# Patient Record
Sex: Male | Born: 1950 | Race: Black or African American | Hispanic: No | Marital: Married | Smoking: Former smoker
Health system: Southern US, Community
[De-identification: ages and names within clinical notes are randomized; demographics above are authoritative.]

## PROBLEM LIST (undated history)

## (undated) DIAGNOSIS — I4892 Unspecified atrial flutter: Secondary | ICD-10-CM

## (undated) DIAGNOSIS — I69320 Aphasia following cerebral infarction: Secondary | ICD-10-CM

## (undated) DIAGNOSIS — F0390 Unspecified dementia without behavioral disturbance: Secondary | ICD-10-CM

## (undated) DIAGNOSIS — I639 Cerebral infarction, unspecified: Secondary | ICD-10-CM

## (undated) DIAGNOSIS — I428 Other cardiomyopathies: Secondary | ICD-10-CM

## (undated) HISTORY — DX: Other cardiomyopathies: I42.8

## (undated) HISTORY — DX: Unspecified atrial flutter: I48.92

## (undated) HISTORY — DX: Cerebral infarction, unspecified: I63.9

## (undated) HISTORY — DX: Aphasia following cerebral infarction: I69.320

## (undated) HISTORY — PX: HERNIA REPAIR: SHX51

## (undated) HISTORY — PX: HIP SURGERY: SHX245

---

## 2009-10-23 ENCOUNTER — Ambulatory Visit: Payer: Self-pay | Admitting: Gastroenterology

## 2014-02-28 ENCOUNTER — Ambulatory Visit: Payer: Self-pay

## 2016-09-07 ENCOUNTER — Emergency Department
Admission: EM | Admit: 2016-09-07 | Discharge: 2016-09-08 | Disposition: A | Discharge status: Home / Self Care | Payer: Medicare HMO | Attending: Emergency Medicine | Admitting: Emergency Medicine

## 2016-09-07 DIAGNOSIS — Z87891 Personal history of nicotine dependence: Secondary | ICD-10-CM | POA: Diagnosis not present

## 2016-09-07 DIAGNOSIS — N390 Urinary tract infection, site not specified: Secondary | ICD-10-CM

## 2016-09-07 DIAGNOSIS — R300 Dysuria: Secondary | ICD-10-CM

## 2016-09-07 DIAGNOSIS — N3090 Cystitis, unspecified without hematuria: Secondary | ICD-10-CM | POA: Diagnosis not present

## 2016-09-07 LAB — URINALYSIS COMPLETE WITH MICROSCOPIC (ARMC ONLY)
BACTERIA UA: NONE SEEN
Bilirubin Urine: NEGATIVE
GLUCOSE, UA: NEGATIVE mg/dL
Ketones, ur: NEGATIVE mg/dL
NITRITE: NEGATIVE
PROTEIN: 100 mg/dL — AB
Specific Gravity, Urine: 1.021 (ref 1.005–1.030)
pH: 5 (ref 5.0–8.0)

## 2016-09-07 MED ORDER — SODIUM CHLORIDE 0.9 % IV BOLUS (SEPSIS)
1000.0000 mL | Freq: Once | INTRAVENOUS | Status: AC
  Administered 2016-09-08: 1000 mL via INTRAVENOUS

## 2016-09-07 NOTE — ED Triage Notes (Addendum)
Pt presents to ED from [facility] clinic for c/o dysuria. Pt reports 2-3 days of painful/burning/ and frequency of urination. Pt denies pain, just reports feeling "uncomfortable". [name] reports pt was given IM injection of ABX (unsure of exact ABX) and was told to report to the ED if he began to feel worse. Pt reports fever of 101.5 earlier today, but is afebrile in triage.

## 2016-09-07 NOTE — ED Notes (Signed)
2 unsuccessful attempts by this RN to straight-stick pt for blood samples for lab tests.

## 2016-09-08 LAB — COMPREHENSIVE METABOLIC PANEL
ALK PHOS: 68 U/L (ref 38–126)
ALT: 13 U/L — AB (ref 17–63)
AST: 17 U/L (ref 15–41)
Albumin: 3.8 g/dL (ref 3.5–5.0)
Anion gap: 7 (ref 5–15)
BILIRUBIN TOTAL: 1.8 mg/dL — AB (ref 0.3–1.2)
BUN: 18 mg/dL (ref 6–20)
CO2: 22 mmol/L (ref 22–32)
CREATININE: 1.11 mg/dL (ref 0.61–1.24)
Calcium: 12.2 mg/dL — ABNORMAL HIGH (ref 8.9–10.3)
Chloride: 105 mmol/L (ref 101–111)
GFR calc Af Amer: >60 mL/min (ref >60)
Glucose, Bld: 114 mg/dL — ABNORMAL HIGH (ref 65–99)
Potassium: 4.3 mmol/L (ref 3.5–5.1)
Sodium: 134 mmol/L — ABNORMAL LOW (ref 135–145)
TOTAL PROTEIN: 7.6 g/dL (ref 6.5–8.1)

## 2016-09-08 LAB — CBC
HCT: 44.9 % (ref 40.0–52.0)
Hemoglobin: 15.6 g/dL (ref 13.0–18.0)
MCH: 31.5 pg (ref 26.0–34.0)
MCHC: 34.7 g/dL (ref 32.0–36.0)
MCV: 90.9 fL (ref 80.0–100.0)
Platelets: 210 K/uL (ref 150–440)
RBC: 4.94 MIL/uL (ref 4.40–5.90)
RDW: 12.9 % (ref 11.5–14.5)
WBC: 15 K/uL — ABNORMAL HIGH (ref 3.8–10.6)

## 2016-09-08 NOTE — ED Provider Notes (Signed)
[facility] Emergency Department Provider Note   ____________________________________________   First MD Initiated Contact with Patient 09/07/16 2322     (approximate)  I have reviewed the triage vital signs and the nursing notes.   HISTORY  Chief Complaint Dysuria    HPI Dustin Blanchard is a 65 y.o. male who comes into the hospital today with some pain with urination. The patient went to the walk-in clinic today due to some pain with urination he had been having since Saturday. He received some blood work and a shot of antibiotic. He reports that he was being discharged home and they told him that if anything was worse he should come back to the hospital but on his way out they said that he should come into the emergency department for further evaluation. The patient did have a temperature at home to 101.8. He has also not eaten much and reports he has lost 10 pounds in the past few days. He denies any vomiting but reports he just doesn't have an appetite. He denies any confusion, vomiting, or diarrhea. The patient just reports he doesn't want to eat. The family did not want to drive him all the way home just to have him come back so they decided to come in for evaluation. The patient did have a white blood cell count of 16 at his walk-in clinic appointment. He denies any flank pain.   History reviewed. No pertinent past medical history.  There are no active problems to display for this patient.   Past Surgical History:  Procedure Laterality Date  . HERNIA REPAIR    . HIP SURGERY      Prior to Admission medications   Not on File    Allergies Review of patient's allergies indicates no known allergies.  No family history on file.  Social History Social History  Substance Use Topics  . Smoking status: Former Games developer  . Smokeless tobacco: Never Used  . Alcohol use No    Review of Systems Constitutional:  fever/chills Eyes: No visual  changes. ENT: No sore throat. Cardiovascular: Denies chest pain. Respiratory: Denies shortness of breath. Gastrointestinal: No abdominal pain.  No nausea, no vomiting.  No diarrhea.  No constipation. Genitourinary:  dysuria. Musculoskeletal: Negative for back pain. Skin: Negative for rash. Neurological: Negative for headaches, focal weakness or numbness.  10-point ROS otherwise negative.  ____________________________________________   PHYSICAL EXAM:  VITAL SIGNS: ED Triage Vitals  Enc Vitals Group     BP 09/07/16 2036 127/82     Pulse Rate 09/07/16 2036 (!) 102     Resp 09/07/16 2036 16     Temp 09/07/16 2036 98.9 F (37.2 C)     Temp Source 09/07/16 2036 Oral     SpO2 09/07/16 2036 98 %     Weight 09/07/16 2037 155 lb (70.3 kg)     Height 09/07/16 2037 6' (1.829 m)     Head Circumference --      Peak Flow --      Pain Score 09/07/16 2037 0     Pain Loc --      Pain Edu? --      Excl. in GC? --     Constitutional: Alert and oriented. Well appearing and in no acute distress. Eyes: Conjunctivae are normal. PERRL. EOMI. Head: Atraumatic. Nose: No congestion/rhinnorhea. Mouth/Throat: Mucous membranes are moist.  Oropharynx non-erythematous. Cardiovascular: Normal rate, regular rhythm. Grossly normal heart sounds.  Good peripheral circulation. Respiratory: Normal respiratory effort.  No retractions. Lungs CTAB. Gastrointestinal: Soft and nontender. No distention. Positive bowel sounds. Musculoskeletal: No lower extremity tenderness nor edema.   Neurologic:  Normal speech and language. . Skin:  Skin is warm, dry and intact.  Psychiatric: Mood and affect are normal.   ____________________________________________   LABS (all labs ordered are listed, but only abnormal results are displayed)  Labs Reviewed  COMPREHENSIVE METABOLIC PANEL - Abnormal; Notable for the following:       Result Value   Sodium 134 (*)    Glucose, Bld 114 (*)    Calcium 12.2 (*)    ALT 13  (*)    Total Bilirubin 1.8 (*)    All other components within normal limits  CBC - Abnormal; Notable for the following:    WBC 15.0 (*)    All other components within normal limits  URINALYSIS COMPLETEWITH MICROSCOPIC ([facility] ONLY) - Abnormal; Notable for the following:    Color, Urine AMBER (*)    APPearance HAZY (*)    Hgb urine dipstick 3+ (*)    Protein, ur 100 (*)    Leukocytes, UA 2+ (*)    Squamous Epithelial / LPF 0-5 (*)    All other components within normal limits   ____________________________________________  EKG  none ____________________________________________  RADIOLOGY  none ____________________________________________   PROCEDURES  Procedure(s) performed: None  Procedures  Critical Care performed: No  ____________________________________________   INITIAL IMPRESSION / ASSESSMENT AND PLAN / ED COURSE  Pertinent labs & imaging results that were available during my care of the patient were reviewed by me and considered in my medical decision making (see chart for details).  This is a 65 year old male who comes into the hospital today after being diagnosed with UTI at his doctor's office. He reports that they told him to come in and have further evaluation. The patient had blood work done previously and received a dose of ceftriaxone at his doctor's office. The patient is afebrile here and is in no distress. He reports that he doesn't have much of an appetite. I will give the patient a liter of normal saline check some orthostatic vital signs. His vital signs otherwise are unremarkable. I will recheck some blood work as well as a urine culture. The patient already received a dose of antibiotics and does not need any further antibiotics at this time. I will reassess the patient once I received all of his information.  Clinical Course    The patient's repeat blood work was unremarkable. He does have a white count of 15 but he does not show any signs of  dehydration. His creatinine and BUN are unremarkable. I did give the patient a liter of normal saline and he did eat a meal tray here. The patient had no vomiting and feels well. His vital signs remained unremarkable while he was here and he is not orthostatic. I will discharge the patient to home. He did receive a prescription earlier today and he was told to come back to the office tomorrow for another shot of ceftriaxone. The patient will be discharged home to follow back up with the acute care clinic. ____________________________________________   FINAL CLINICAL IMPRESSION(S) / ED DIAGNOSES  Final diagnoses:  UTI (lower urinary tract infection)  Cystitis  Dysuria      NEW MEDICATIONS STARTED DURING THIS VISIT:  New Prescriptions   No medications on file     Note:  This document was prepared using Dragon voice recognition software and may include unintentional dictation errors.  Rebecka Apley, MD 09/08/16 [phone]

## 2016-09-08 NOTE — ED Notes (Signed)
Pt denies any c/o nausea after eating sandwich tray.

## 2016-09-08 NOTE — ED Notes (Signed)
Pt given meal tray and drink for PO challenge, per Dr Zenda Alpers.

## 2016-09-09 LAB — URINE CULTURE: Culture: <10000 — AB

## 2017-07-28 ENCOUNTER — Other Ambulatory Visit: Payer: Self-pay | Admitting: Unknown Physician Specialty

## 2017-07-28 DIAGNOSIS — Z86010 Personal history of colonic polyps: Secondary | ICD-10-CM

## 2017-07-28 DIAGNOSIS — K562 Volvulus: Secondary | ICD-10-CM

## 2017-08-12 ENCOUNTER — Ambulatory Visit
Admission: RE | Admit: 2017-08-12 | Discharge: 2017-08-12 | Disposition: A | Discharge status: Home / Self Care | Payer: Medicare HMO | Source: Ambulatory Visit | Attending: Unknown Physician Specialty | Admitting: Unknown Physician Specialty

## 2017-08-12 DIAGNOSIS — Z86010 Personal history of colonic polyps: Secondary | ICD-10-CM

## 2017-08-12 DIAGNOSIS — K562 Volvulus: Secondary | ICD-10-CM

## 2017-10-06 IMAGING — CT CT VIRTUAL COLONOSCOPY DIAGNOSTIC
2 of 7 series · 9 of 36 positions shown, 15 images · non-contrast
Comparison: None.

CLINICAL DATA: Incomplete optical colonoscopy.  Tortuous colon.

EXAM:
CT VIRTUAL COLONOSCOPY DIAGNOSTIC
TECHNIQUE: The patient was given a standard bowel preparation with Gastrografin
and barium for fluid and stool tagging respectively. The quality of
the bowel preparation is moderate. Automated CO2 insufflation of the
colon was performed prior to image acquisition and colonic
distention is moderate. Image post processing was used to generate a
3D endoluminal fly-through projection of the colon and to
electronically subtract stool/fluid as appropriate.

[Series 8: prone (id) · axial · 0.70mm/px · z∈[-513,-76]mm · 8 of 451 slices shown, 13 images]
[im 51/451  soft-tissue]
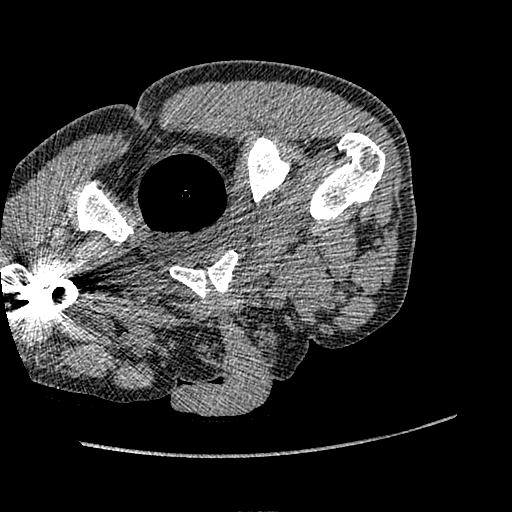
[im 51/451  bone]
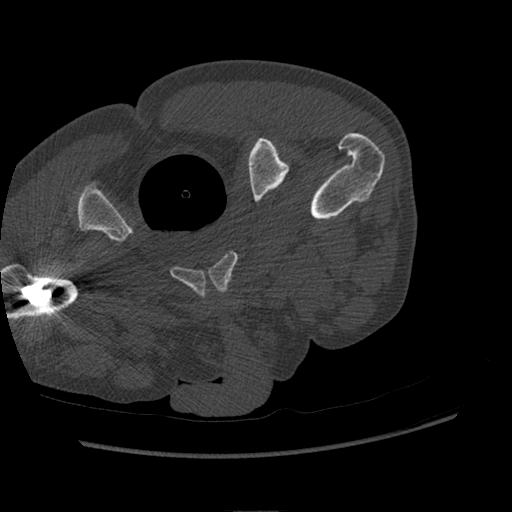
[im 101/451  soft-tissue]
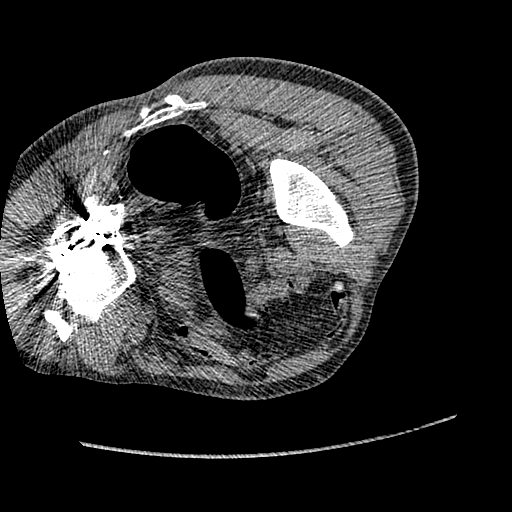
[im 151/451  soft-tissue]
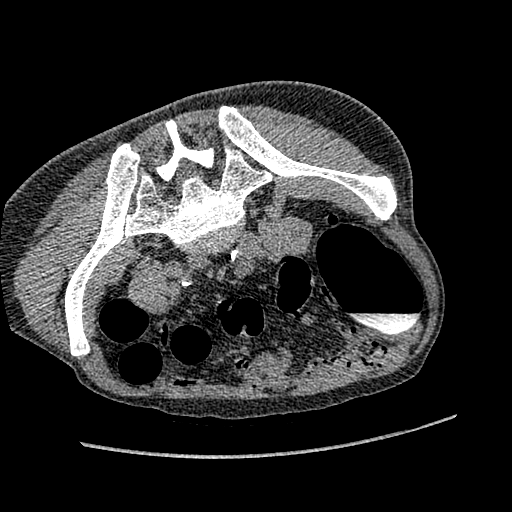
[im 201/451  soft-tissue]
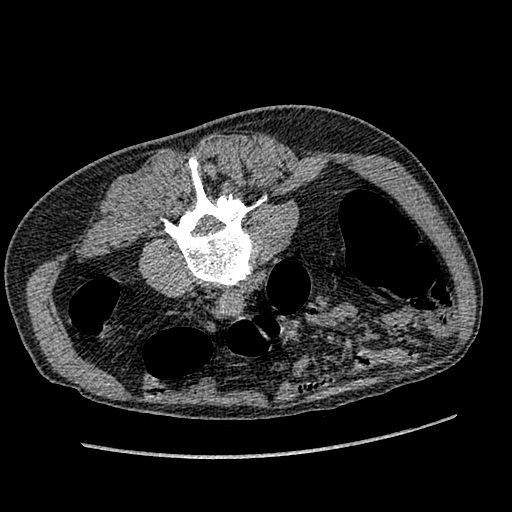
[im 251/451  soft-tissue]
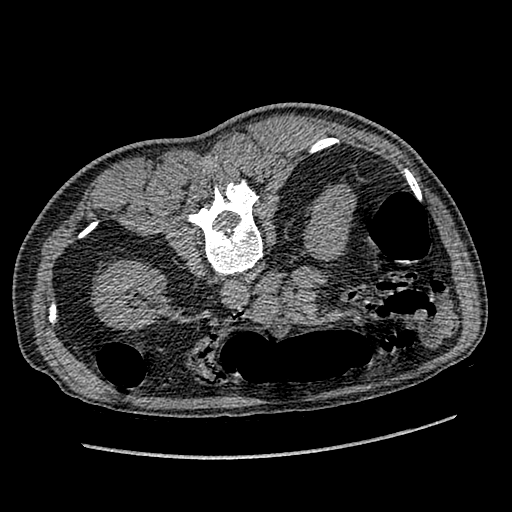
[im 251/451  lung]
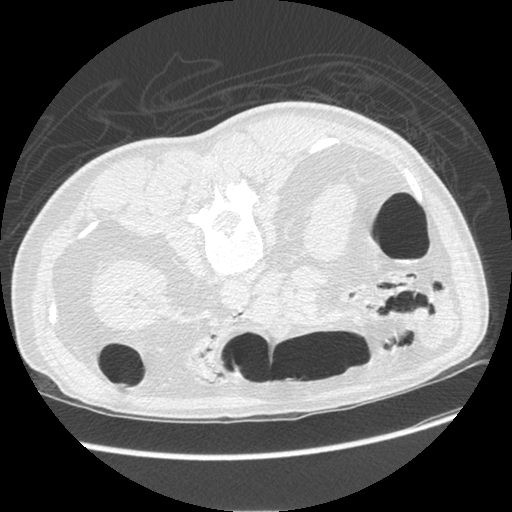
[im 301/451  soft-tissue]
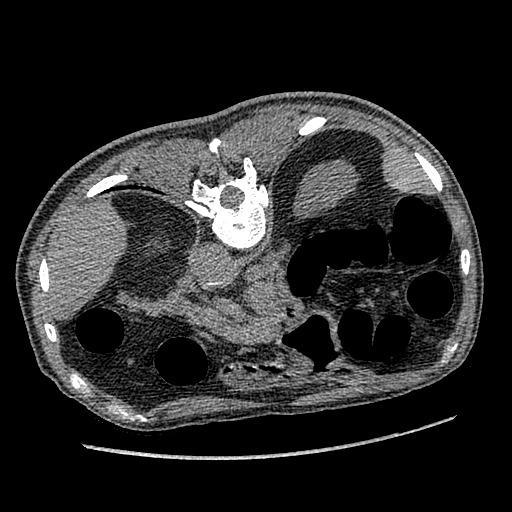
[im 301/451  lung]
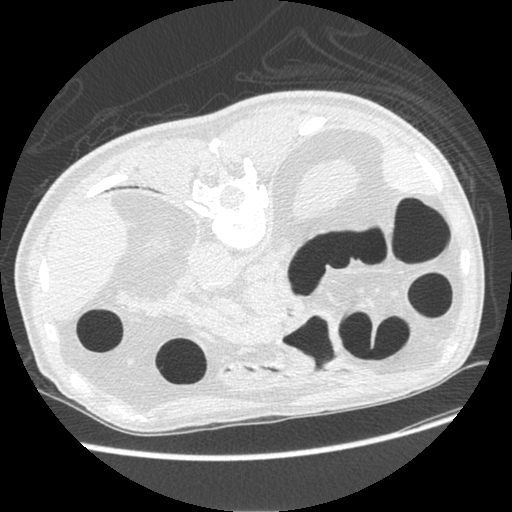
[im 351/451  soft-tissue]
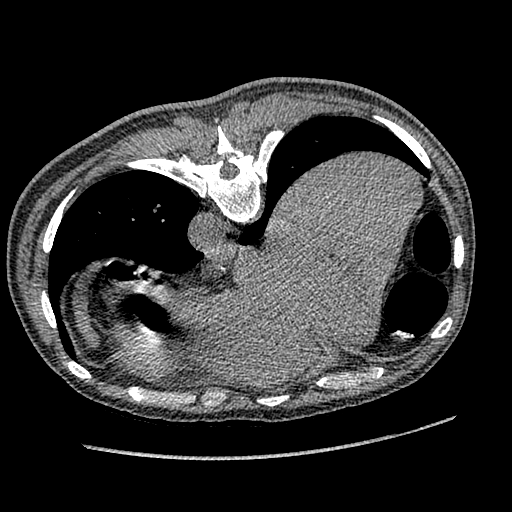
[im 351/451  lung]
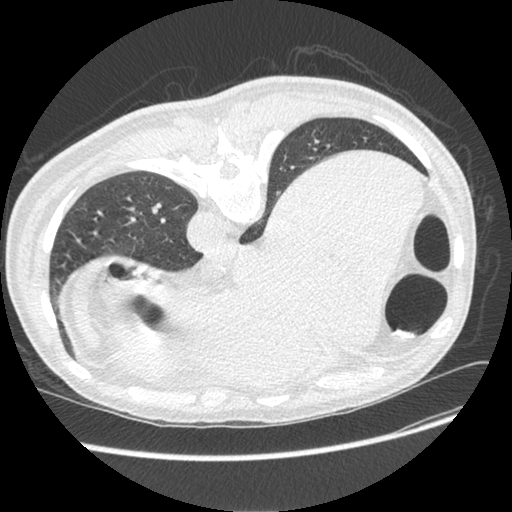
[im 401/451  soft-tissue]
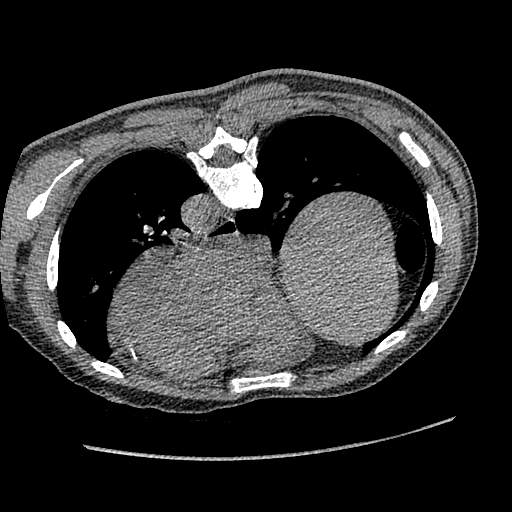
[im 401/451  lung]
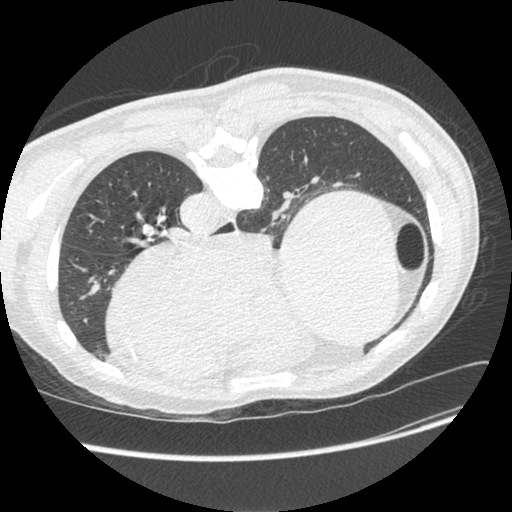

[Series 601: coronal body · coronal · 1.05mm/px · 1 of 114 slices shown, 2 images]
[im 38/114  soft-tissue]
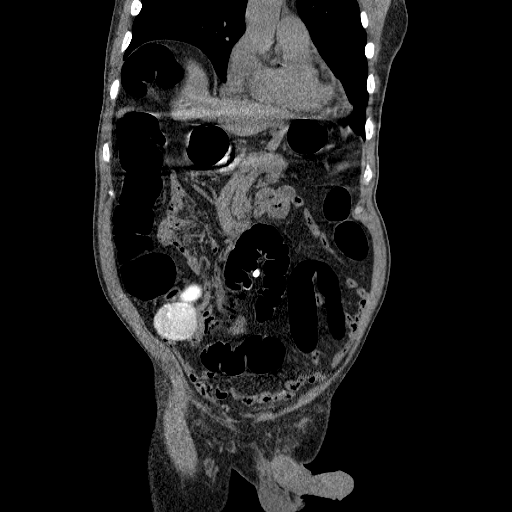
[im 38/114  bone]
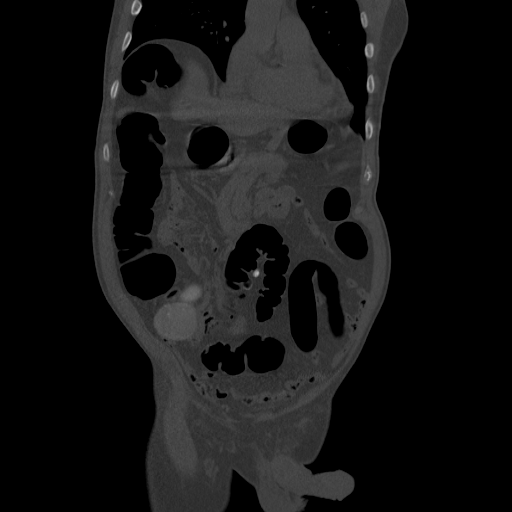

[9 of 36 positions shown; findings below may reference images not displayed]

FINDINGS: VIRTUAL COLONOSCOPY

Scattered sigmoid diverticula. Tortuosity of the colon. Some
retained barium tagged stool and fluid. No persistent non barium
tagged polypoid filling defect. No annular constricting lesions.

Virtual colonoscopy is not designed to detect diminutive polyps
(i.e., less than or equal to 5 mm), the presence or absence of which
may not affect clinical management.

CT ABDOMEN AND PELVIS WITHOUT CONTRAST

Lower chest: Heart is borderline in size. There is moderate to large
pericardial effusion. Moderate coronary artery calcifications. Lung
bases clear. Mild elevation of the right hemidiaphragm.

Hepatobiliary: No focal hepatic abnormality. Gallbladder not
visualized, possibly decompressed, contracted or surgically absent.

Pancreas: No focal abnormality or ductal dilatation.

Spleen: No focal abnormality.  Normal size.

Adrenals/Urinary Tract: Small exophytic left adrenal nodule measures
9 mm, nonspecific, but most likely adenoma. No focal renal
abnormality or hydronephrosis. Urinary bladder is unremarkable.

Stomach/Bowel: Appendix is visualized and is normal. Stomach and
small bowel decompressed, unremarkable.

Vascular/Lymphatic: No evidence of aneurysm or adenopathy.

Reproductive: Enlarged prostate, measuring 6.2 x 5.1 cm. Scattered
calcifications within the prostate.

Other: No free fluid or free air.

Musculoskeletal: Postoperative and posttraumatic changes within the
left pelvis and proximal femur. No acute bony abnormality.
Degenerative changes in the lumbar spine.
IMPRESSION: Tortuous colon. Scattered sigmoid diverticula. No persistent polyp
or annular constricting lesion.

Moderate to large pericardial effusion.

Coronary artery disease.

Small exophytic left adrenal nodule, nonspecific but most likely a
small at the normal in the absence of prior cancer history.

Enlarged prostate.

## 2022-08-06 ENCOUNTER — Encounter (HOSPITAL_COMMUNITY): Payer: Self-pay

## 2022-08-06 ENCOUNTER — Inpatient Hospital Stay (HOSPITAL_COMMUNITY): Payer: Medicare HMO

## 2022-08-06 ENCOUNTER — Ambulatory Visit (HOSPITAL_COMMUNITY): Payer: Medicare HMO

## 2022-08-06 ENCOUNTER — Encounter (HOSPITAL_COMMUNITY)
Admission: EM | Disposition: A | Discharge status: Rehabilitation Facility | Payer: Self-pay | Source: Home / Self Care | Attending: Neurology

## 2022-08-06 ENCOUNTER — Emergency Department (HOSPITAL_COMMUNITY): Payer: Medicare HMO | Admitting: Anesthesiology

## 2022-08-06 ENCOUNTER — Encounter (HOSPITAL_COMMUNITY): Payer: Self-pay | Admitting: Emergency Medicine

## 2022-08-06 ENCOUNTER — Inpatient Hospital Stay (HOSPITAL_COMMUNITY)
Admission: EM | Admit: 2022-08-06 | Discharge: 2022-08-12 | DRG: 023 | Disposition: A | Discharge status: Rehabilitation Facility | Payer: Medicare HMO | Attending: Neurology | Admitting: Neurology

## 2022-08-06 ENCOUNTER — Emergency Department (HOSPITAL_COMMUNITY): Payer: Medicare HMO

## 2022-08-06 ENCOUNTER — Emergency Department (EMERGENCY_DEPARTMENT_HOSPITAL): Payer: Medicare HMO | Admitting: Anesthesiology

## 2022-08-06 ENCOUNTER — Other Ambulatory Visit: Payer: Self-pay

## 2022-08-06 ENCOUNTER — Other Ambulatory Visit (HOSPITAL_COMMUNITY): Payer: Medicare HMO

## 2022-08-06 DIAGNOSIS — D72829 Elevated white blood cell count, unspecified: Secondary | ICD-10-CM | POA: Diagnosis not present

## 2022-08-06 DIAGNOSIS — Z87891 Personal history of nicotine dependence: Secondary | ICD-10-CM

## 2022-08-06 DIAGNOSIS — I63512 Cerebral infarction due to unspecified occlusion or stenosis of left middle cerebral artery: Secondary | ICD-10-CM

## 2022-08-06 DIAGNOSIS — N401 Enlarged prostate with lower urinary tract symptoms: Secondary | ICD-10-CM | POA: Diagnosis present

## 2022-08-06 DIAGNOSIS — I3139 Other pericardial effusion (noninflammatory): Secondary | ICD-10-CM | POA: Diagnosis present

## 2022-08-06 DIAGNOSIS — Z781 Physical restraint status: Secondary | ICD-10-CM | POA: Diagnosis not present

## 2022-08-06 DIAGNOSIS — I471 Supraventricular tachycardia: Secondary | ICD-10-CM | POA: Diagnosis not present

## 2022-08-06 DIAGNOSIS — R4701 Aphasia: Secondary | ICD-10-CM | POA: Diagnosis present

## 2022-08-06 DIAGNOSIS — I63412 Cerebral infarction due to embolism of left middle cerebral artery: Secondary | ICD-10-CM

## 2022-08-06 DIAGNOSIS — Z79899 Other long term (current) drug therapy: Secondary | ICD-10-CM

## 2022-08-06 DIAGNOSIS — I495 Sick sinus syndrome: Secondary | ICD-10-CM | POA: Diagnosis present

## 2022-08-06 DIAGNOSIS — N39498 Other specified urinary incontinence: Secondary | ICD-10-CM | POA: Diagnosis present

## 2022-08-06 DIAGNOSIS — I4891 Unspecified atrial fibrillation: Secondary | ICD-10-CM

## 2022-08-06 DIAGNOSIS — R29718 NIHSS score 18: Secondary | ICD-10-CM | POA: Diagnosis present

## 2022-08-06 DIAGNOSIS — I619 Nontraumatic intracerebral hemorrhage, unspecified: Secondary | ICD-10-CM | POA: Diagnosis present

## 2022-08-06 DIAGNOSIS — I429 Cardiomyopathy, unspecified: Secondary | ICD-10-CM | POA: Diagnosis present

## 2022-08-06 DIAGNOSIS — I509 Heart failure, unspecified: Secondary | ICD-10-CM | POA: Diagnosis not present

## 2022-08-06 DIAGNOSIS — G8191 Hemiplegia, unspecified affecting right dominant side: Secondary | ICD-10-CM | POA: Diagnosis present

## 2022-08-06 DIAGNOSIS — E210 Primary hyperparathyroidism: Secondary | ICD-10-CM | POA: Diagnosis present

## 2022-08-06 DIAGNOSIS — F05 Delirium due to known physiological condition: Secondary | ICD-10-CM | POA: Diagnosis not present

## 2022-08-06 DIAGNOSIS — I5041 Acute combined systolic (congestive) and diastolic (congestive) heart failure: Secondary | ICD-10-CM

## 2022-08-06 DIAGNOSIS — F01A0 Vascular dementia, mild, without behavioral disturbance, psychotic disturbance, mood disturbance, and anxiety: Secondary | ICD-10-CM | POA: Diagnosis present

## 2022-08-06 DIAGNOSIS — I455 Other specified heart block: Secondary | ICD-10-CM

## 2022-08-06 DIAGNOSIS — F0390 Unspecified dementia without behavioral disturbance: Secondary | ICD-10-CM | POA: Diagnosis not present

## 2022-08-06 DIAGNOSIS — I69391 Dysphagia following cerebral infarction: Secondary | ICD-10-CM | POA: Diagnosis not present

## 2022-08-06 DIAGNOSIS — I5043 Acute on chronic combined systolic (congestive) and diastolic (congestive) heart failure: Secondary | ICD-10-CM | POA: Diagnosis present

## 2022-08-06 DIAGNOSIS — T80818A Extravasation of other vesicant agent, initial encounter: Secondary | ICD-10-CM | POA: Diagnosis present

## 2022-08-06 DIAGNOSIS — I480 Paroxysmal atrial fibrillation: Secondary | ICD-10-CM | POA: Diagnosis present

## 2022-08-06 DIAGNOSIS — R1312 Dysphagia, oropharyngeal phase: Secondary | ICD-10-CM | POA: Diagnosis not present

## 2022-08-06 DIAGNOSIS — R001 Bradycardia, unspecified: Secondary | ICD-10-CM | POA: Diagnosis not present

## 2022-08-06 DIAGNOSIS — R531 Weakness: Secondary | ICD-10-CM | POA: Diagnosis present

## 2022-08-06 DIAGNOSIS — E785 Hyperlipidemia, unspecified: Secondary | ICD-10-CM | POA: Diagnosis present

## 2022-08-06 DIAGNOSIS — I110 Hypertensive heart disease with heart failure: Secondary | ICD-10-CM | POA: Diagnosis present

## 2022-08-06 DIAGNOSIS — N3000 Acute cystitis without hematuria: Secondary | ICD-10-CM | POA: Diagnosis not present

## 2022-08-06 DIAGNOSIS — Z20822 Contact with and (suspected) exposure to covid-19: Secondary | ICD-10-CM | POA: Diagnosis present

## 2022-08-06 DIAGNOSIS — R29810 Facial weakness: Secondary | ICD-10-CM | POA: Diagnosis present

## 2022-08-06 DIAGNOSIS — I255 Ischemic cardiomyopathy: Secondary | ICD-10-CM | POA: Diagnosis not present

## 2022-08-06 DIAGNOSIS — Z9282 Status post administration of tPA (rtPA) in a different facility within the last 24 hours prior to admission to current facility: Secondary | ICD-10-CM

## 2022-08-06 DIAGNOSIS — R1310 Dysphagia, unspecified: Secondary | ICD-10-CM | POA: Diagnosis present

## 2022-08-06 DIAGNOSIS — I5042 Chronic combined systolic (congestive) and diastolic (congestive) heart failure: Secondary | ICD-10-CM | POA: Diagnosis not present

## 2022-08-06 DIAGNOSIS — J189 Pneumonia, unspecified organism: Secondary | ICD-10-CM | POA: Diagnosis not present

## 2022-08-06 HISTORY — PX: IR PERCUTANEOUS ART THROMBECTOMY/INFUSION INTRACRANIAL INC DIAG ANGIO: IMG6087

## 2022-08-06 HISTORY — PX: RADIOLOGY WITH ANESTHESIA: SHX6223

## 2022-08-06 HISTORY — PX: IR CT HEAD LTD: IMG2386

## 2022-08-06 HISTORY — PX: IR US GUIDE VASC ACCESS RIGHT: IMG2390

## 2022-08-06 HISTORY — DX: Unspecified dementia, unspecified severity, without behavioral disturbance, psychotic disturbance, mood disturbance, and anxiety: F03.90

## 2022-08-06 LAB — URINALYSIS, ROUTINE W REFLEX MICROSCOPIC
Bacteria, UA: NONE SEEN
Bilirubin Urine: NEGATIVE
Glucose, UA: 50 mg/dL — AB
Ketones, ur: NEGATIVE mg/dL
Leukocytes,Ua: NEGATIVE
Nitrite: NEGATIVE
Protein, ur: NEGATIVE mg/dL
Specific Gravity, Urine: 1.019 (ref 1.005–1.030)
pH: 8 (ref 5.0–8.0)

## 2022-08-06 LAB — CBC
HCT: 40.5 % (ref 39.0–52.0)
Hemoglobin: 13.7 g/dL (ref 13.0–17.0)
MCH: 32.5 pg (ref 26.0–34.0)
MCHC: 33.8 g/dL (ref 30.0–36.0)
MCV: 96 fL (ref 80.0–100.0)
Platelets: 210 10*3/uL (ref 150–400)
RBC: 4.22 MIL/uL (ref 4.22–5.81)
RDW: 12.3 % (ref 11.5–15.5)
WBC: 4.2 10*3/uL (ref 4.0–10.5)
nRBC: 0 % (ref 0.0–0.2)

## 2022-08-06 LAB — ECHOCARDIOGRAM COMPLETE
Area-P 1/2: 5.66 cm2
Calc EF: 37.1 %
Height: 72 in
S' Lateral: 3.9 cm
Single Plane A2C EF: 39.1 %
Single Plane A4C EF: 37.2 %
Weight: 2539.7 oz

## 2022-08-06 LAB — RESP PANEL BY RT-PCR (FLU A&B, COVID) ARPGX2
Influenza A by PCR: NEGATIVE
Influenza B by PCR: NEGATIVE
SARS Coronavirus 2 by RT PCR: NEGATIVE

## 2022-08-06 LAB — COMPREHENSIVE METABOLIC PANEL
ALT: 13 U/L (ref 0–44)
AST: 14 U/L — ABNORMAL LOW (ref 15–41)
Albumin: 3.9 g/dL (ref 3.5–5.0)
Alkaline Phosphatase: 66 U/L (ref 38–126)
Anion gap: 7 (ref 5–15)
BUN: 10 mg/dL (ref 8–23)
CO2: 27 mmol/L (ref 22–32)
Calcium: 9.4 mg/dL (ref 8.9–10.3)
Chloride: 109 mmol/L (ref 98–111)
Creatinine, Ser: 1 mg/dL (ref 0.61–1.24)
GFR, Estimated: >60 mL/min (ref >60)
Glucose, Bld: 114 mg/dL — ABNORMAL HIGH (ref 70–99)
Potassium: 4.3 mmol/L (ref 3.5–5.1)
Sodium: 143 mmol/L (ref 135–145)
Total Bilirubin: 0.7 mg/dL (ref 0.3–1.2)
Total Protein: 6.9 g/dL (ref 6.5–8.1)

## 2022-08-06 LAB — DIFFERENTIAL
Abs Immature Granulocytes: 0.02 10*3/uL (ref 0.00–0.07)
Basophils Absolute: 0 10*3/uL (ref 0.0–0.1)
Basophils Relative: 0 %
Eosinophils Absolute: 0.1 10*3/uL (ref 0.0–0.5)
Eosinophils Relative: 1 %
Immature Granulocytes: 1 %
Lymphocytes Relative: 28 %
Lymphs Abs: 1.2 10*3/uL (ref 0.7–4.0)
Monocytes Absolute: 0.3 10*3/uL (ref 0.1–1.0)
Monocytes Relative: 6 %
Neutro Abs: 2.7 10*3/uL (ref 1.7–7.7)
Neutrophils Relative %: 64 %

## 2022-08-06 LAB — RAPID URINE DRUG SCREEN, HOSP PERFORMED
Amphetamines: NOT DETECTED
Barbiturates: NOT DETECTED
Benzodiazepines: NOT DETECTED
Cocaine: NOT DETECTED
Opiates: NOT DETECTED
Tetrahydrocannabinol: NOT DETECTED

## 2022-08-06 LAB — ETHANOL: Alcohol, Ethyl (B): <10 mg/dL (ref <10)

## 2022-08-06 LAB — HEMOGLOBIN A1C
Hgb A1c MFr Bld: 4.6 % — ABNORMAL LOW (ref 4.8–5.6)
Mean Plasma Glucose: 85.32 mg/dL

## 2022-08-06 LAB — GLUCOSE, CAPILLARY: Glucose-Capillary: 112 mg/dL — ABNORMAL HIGH (ref 70–99)

## 2022-08-06 LAB — MRSA NEXT GEN BY PCR, NASAL: MRSA by PCR Next Gen: NOT DETECTED

## 2022-08-06 LAB — HIV ANTIBODY (ROUTINE TESTING W REFLEX): HIV Screen 4th Generation wRfx: NONREACTIVE

## 2022-08-06 LAB — APTT: aPTT: 28 seconds (ref 24–36)

## 2022-08-06 LAB — PROTIME-INR
INR: 1.2 (ref 0.8–1.2)
Prothrombin Time: 15.2 seconds (ref 11.4–15.2)

## 2022-08-06 SURGERY — IR WITH ANESTHESIA
Anesthesia: General

## 2022-08-06 MED ORDER — SENNOSIDES-DOCUSATE SODIUM 8.6-50 MG PO TABS
1.0000 | ORAL_TABLET | Freq: Two times a day (BID) | ORAL | Status: DC | PRN
Start: 2022-08-06 — End: 2022-08-12

## 2022-08-06 MED ORDER — IOHEXOL 350 MG/ML SOLN
100.0000 mL | Freq: Once | INTRAVENOUS | Status: AC | PRN
  Administered 2022-08-06: 100 mL via INTRAVENOUS

## 2022-08-06 MED ORDER — SUGAMMADEX SODIUM 200 MG/2ML IV SOLN
INTRAVENOUS | Status: DC | PRN
  Administered 2022-08-06: 200 mg via INTRAVENOUS

## 2022-08-06 MED ORDER — PHENYLEPHRINE HCL-NACL 20-0.9 MG/250ML-% IV SOLN
INTRAVENOUS | Status: DC | PRN
  Administered 2022-08-06: 25 ug/min via INTRAVENOUS

## 2022-08-06 MED ORDER — CEFAZOLIN SODIUM-DEXTROSE 2-4 GM/100ML-% IV SOLN
INTRAVENOUS | Status: AC
  Filled 2022-08-06: qty 100

## 2022-08-06 MED ORDER — SODIUM CHLORIDE 0.9 % IV SOLN: INTRAVENOUS | Status: DC

## 2022-08-06 MED ORDER — SODIUM CHLORIDE 0.9 % IV SOLN: INTRAVENOUS | Status: DC | PRN

## 2022-08-06 MED ORDER — PANTOPRAZOLE SODIUM 40 MG IV SOLR
40.0000 mg | Freq: Every day | INTRAVENOUS | Status: DC
  Administered 2022-08-07 – 2022-08-08 (×3): 40 mg via INTRAVENOUS
  Filled 2022-08-06 (×3): qty 10

## 2022-08-06 MED ORDER — ACETAMINOPHEN 650 MG RE SUPP: 650.0000 mg | RECTAL | Status: DC | PRN

## 2022-08-06 MED ORDER — IOHEXOL 300 MG/ML  SOLN
100.0000 mL | Freq: Once | INTRAMUSCULAR | Status: AC | PRN
Start: 2022-08-06 — End: 2022-08-06
  Administered 2022-08-06: 50 mL via INTRA_ARTERIAL

## 2022-08-06 MED ORDER — LIDOCAINE HCL (CARDIAC) PF 50 MG/5ML IV SOSY
PREFILLED_SYRINGE | INTRAVENOUS | Status: DC | PRN
  Administered 2022-08-06: 60 mg via INTRAVENOUS

## 2022-08-06 MED ORDER — CLEVIDIPINE BUTYRATE 0.5 MG/ML IV EMUL: 0.0000 mg/h | INTRAVENOUS | Status: DC

## 2022-08-06 MED ORDER — ACETAMINOPHEN 325 MG PO TABS: 650.0000 mg | ORAL_TABLET | ORAL | Status: DC | PRN

## 2022-08-06 MED ORDER — SUCCINYLCHOLINE CHLORIDE 20 MG/ML IJ SOLN
INTRAMUSCULAR | Status: DC | PRN
  Administered 2022-08-06: 120 mg via INTRAVENOUS

## 2022-08-06 MED ORDER — EPHEDRINE SULFATE-NACL 50-0.9 MG/10ML-% IV SOSY
PREFILLED_SYRINGE | INTRAVENOUS | Status: DC | PRN
  Administered 2022-08-06: 5 mg via INTRAVENOUS

## 2022-08-06 MED ORDER — ACETAMINOPHEN 650 MG RE SUPP
650.0000 mg | RECTAL | Status: DC | PRN
Start: 2022-08-06 — End: 2022-08-12

## 2022-08-06 MED ORDER — PROPOFOL 10 MG/ML IV BOLUS
INTRAVENOUS | Status: DC | PRN
  Administered 2022-08-06: 120 mg via INTRAVENOUS

## 2022-08-06 MED ORDER — DEXAMETHASONE SODIUM PHOSPHATE 4 MG/ML IJ SOLN
INTRAMUSCULAR | Status: DC | PRN
  Administered 2022-08-06: 8 mg via INTRAVENOUS

## 2022-08-06 MED ORDER — IOHEXOL 300 MG/ML  SOLN
100.0000 mL | Freq: Once | INTRAMUSCULAR | Status: AC | PRN
  Administered 2022-08-06: 50 mL via INTRA_ARTERIAL

## 2022-08-06 MED ORDER — ORAL CARE MOUTH RINSE
15.0000 mL | OROMUCOSAL | Status: DC
  Administered 2022-08-06 – 2022-08-07 (×4): 15 mL via OROMUCOSAL

## 2022-08-06 MED ORDER — CHLORHEXIDINE GLUCONATE CLOTH 2 % EX PADS
6.0000 | MEDICATED_PAD | Freq: Every day | CUTANEOUS | Status: DC
Start: 2022-08-06 — End: 2022-08-07
  Administered 2022-08-06 – 2022-08-07 (×2): 6 via TOPICAL

## 2022-08-06 MED ORDER — CLEVIDIPINE BUTYRATE 0.5 MG/ML IV EMUL
0.0000 mg/h | INTRAVENOUS | Status: DC
  Administered 2022-08-06: 2 mg/h via INTRAVENOUS
  Filled 2022-08-06: qty 50

## 2022-08-06 MED ORDER — ONDANSETRON HCL 4 MG/2ML IJ SOLN
INTRAMUSCULAR | Status: DC | PRN
  Administered 2022-08-06: 4 mg via INTRAVENOUS

## 2022-08-06 MED ORDER — ACETAMINOPHEN 160 MG/5ML PO SOLN
650.0000 mg | ORAL | Status: DC | PRN
  Administered 2022-08-11: 650 mg
  Filled 2022-08-06: qty 20.3

## 2022-08-06 MED ORDER — CEFAZOLIN SODIUM-DEXTROSE 2-3 GM-%(50ML) IV SOLR
INTRAVENOUS | Status: DC | PRN
  Administered 2022-08-06: 2 g via INTRAVENOUS

## 2022-08-06 MED ORDER — STROKE: EARLY STAGES OF RECOVERY BOOK
Freq: Once | Status: AC
  Filled 2022-08-06 (×2): qty 1

## 2022-08-06 MED ORDER — LABETALOL HCL 5 MG/ML IV SOLN: 20.0000 mg | Freq: Once | INTRAVENOUS | Status: DC

## 2022-08-06 MED ORDER — ROCURONIUM BROMIDE 100 MG/10ML IV SOLN
INTRAVENOUS | Status: DC | PRN
  Administered 2022-08-06: 60 mg via INTRAVENOUS

## 2022-08-06 MED ORDER — ORAL CARE MOUTH RINSE: 15.0000 mL | OROMUCOSAL | Status: DC | PRN

## 2022-08-06 MED ORDER — ACETAMINOPHEN 160 MG/5ML PO SOLN: 650.0000 mg | ORAL | Status: DC | PRN

## 2022-08-06 MED ORDER — LORAZEPAM 2 MG/ML IJ SOLN
1.0000 mg | Freq: Once | INTRAMUSCULAR | Status: DC
  Filled 2022-08-06: qty 1

## 2022-08-06 MED ORDER — LORAZEPAM 2 MG/ML IJ SOLN
1.0000 mg | Freq: Once | INTRAMUSCULAR | Status: AC
  Administered 2022-08-06: 1 mg via INTRAVENOUS
  Filled 2022-08-06: qty 1

## 2022-08-06 NOTE — Anesthesia Procedure Notes (Signed)
Procedure Name: Intubation Date/Time: 08/06/2022 6:51 AM  Performed by: Edmonia Caprio, CRNAPre-anesthesia Checklist: Patient identified, Emergency Drugs available, Suction available, Patient being monitored and Timeout performed Patient Re-evaluated:Patient Re-evaluated prior to induction Oxygen Delivery Method: Circle system utilized Preoxygenation: Pre-oxygenation with 100% oxygen Induction Type: IV induction and Rapid sequence Laryngoscope Size: Miller, 2, Glidescope and 3 Grade View: Grade I Tube type: Oral Tube size: 7.5 mm Number of attempts: 1 Airway Equipment and Method: Stylet and Video-laryngoscopy Secured at: 23 cm Tube secured with: Tape Dental Injury: Teeth and Oropharynx as per pre-operative assessment  Comments: DLx1 w Hyacinth Meeker 2, unable to pick up epiglottis.  Glidescope 3 with Gr 1 view, ETT passed easily.  +ETC02 but cuff leak.  Tried to exchange over bougie but new ETT not passing easily, visualized again with glidescope and 7.5 ETT placed, +BBS +ETC02.

## 2022-08-06 NOTE — Progress Notes (Signed)
Interventional Radiology Brief Note:  Patient assessed alongside Dr. Tommie Sams.  He awakens to voice and stimulation, however only grunts and does not follow commands. Minimal movement with the right hand noted.  Per RN, moving right leg, however this was not observed during visit today.  Groin site intact.   Repeat CT Head WO at 5pm. This has been ordered.   VSS. BP with adequate control.   NIR following.   Loyce Dys, MS RD PA-C

## 2022-08-06 NOTE — ED Provider Notes (Signed)
[facility] EMERGENCY DEPARTMENT Provider Note   CSN: [id number] Arrival date & time: 08/06/22  [id number]  An emergency department physician performed an initial assessment on this suspected stroke patient at 0443.  History  Chief Complaint  Patient presents with   Code Stroke    Dustin Blanchard is a 71 y.o. male.  Brought to the emergency department from home after a fall.  Patient went to bed at 11:30 PM.  Around 3 AM family heard him fall.  EMS was called and they have identified right-sided weakness, right-sided facial droop, leftward gaze with lack of speech.  Patient does not take blood thinners.       Home Medications Prior to Admission medications   Not on File      Allergies    Patient has no known allergies.    Review of Systems   Review of Systems  Physical Exam Updated Vital Signs BP 129/87   Pulse 69   Temp 98.4 F (36.9 C) (Oral)   Resp 18   Ht 6' (1.829 m)   Wt 72 kg   SpO2 97%   BMI 21.53 kg/m  Physical Exam Vitals and nursing note reviewed.  Constitutional:      General: He is in acute distress.  HENT:     Head: Atraumatic.  Eyes:     Comments: Leftward gaze  Cardiovascular:     Rate and Rhythm: Normal rate and regular rhythm.  Pulmonary:     Effort: Pulmonary effort is normal.     Breath sounds: Normal breath sounds.  Abdominal:     Palpations: Abdomen is soft.  Musculoskeletal:        General: No deformity.  Skin:    General: Skin is warm and dry.  Neurological:     GCS: GCS eye subscore is 4. GCS verbal subscore is 2. GCS motor subscore is 6.     Cranial Nerves: Facial asymmetry present.     Motor: Weakness (R hemiparesis) present.     ED Results / Procedures / Treatments   Labs (all labs ordered are listed, but only abnormal results are displayed) Labs Reviewed  COMPREHENSIVE METABOLIC PANEL - Abnormal; Notable for the following components:      Result Value   Glucose, Bld 114 (*)    AST 14 (*)    All other components  within normal limits  GLUCOSE, CAPILLARY - Abnormal; Notable for the following components:   Glucose-Capillary 112 (*)    All other components within normal limits  RESP PANEL BY RT-PCR (FLU A&B, COVID) ARPGX2  ETHANOL  PROTIME-INR  APTT  CBC  DIFFERENTIAL  RAPID URINE DRUG SCREEN, HOSP PERFORMED  URINALYSIS, ROUTINE W REFLEX MICROSCOPIC  I-STAT CHEM 8, ED    EKG None  Radiology CT ANGIO HEAD NECK W WO CM W PERF (CODE STROKE)  Result Date: 08/06/2022 CLINICAL DATA:  71 year old male code stroke presentation, left MCA symptomatology. EXAM: CT ANGIOGRAPHY HEAD AND NECK CT PERFUSION BRAIN TECHNIQUE: Multidetector CT imaging of the head and neck was performed using the standard protocol during bolus administration of intravenous contrast. Multiplanar CT image reconstructions and MIPs were obtained to evaluate the vascular anatomy. Carotid stenosis measurements (when applicable) are obtained utilizing NASCET criteria, using the distal internal carotid diameter as the denominator. Multiphase CT imaging of the brain was performed following IV bolus contrast injection. Subsequent parametric perfusion maps were calculated using RAPID software. RADIATION DOSE REDUCTION: This exam was performed according to the departmental dose-optimization program which includes automated  exposure control, adjustment of the mA and/or kV according to patient size and/or use of iterative reconstruction technique. CONTRAST:  OMNIPAQUE IOHEXOL 350 MG/ML SOLN COMPARISON:  Plain head CT 0449 hours today. CT cervical spine reported separately. FINDINGS: CT Brain Perfusion Findings: ASPECTS: Ten CBF (<30%) Volume: 40mL. CBV parameter changes of 13-16 mL at the left operculum. The Perfusion (Tmax>6.0s) volume: .  Hypoperfusion index 0.2. Mismatch Volume: Infarction Location:Left MCA territory CTA NECK Skeleton: Cervical spine CT detailed separately. No acute osseous abnormality identified. Upper chest: Mild  dependent atelectasis. Mild retained secretions in the upper thoracic esophagus. Other neck: Left thyroid nodules smaller than 1.5 cm Not clinically significant; no follow-up imaging recommended (ref: J Am Coll Radiol. 2015 Feb;12(2): 143-50).Otherwise negative. Aortic arch: Calcified aortic atherosclerosis. 3 vessel arch configuration. Right carotid system: Minimal plaque. Mild motion artifact distal to the right ICA bulb. No stenosis. Left carotid system: Mild tortuosity and minimal plaque. No stenosis. Vertebral arteries: Minimal plaque except at the left vertebral artery origin and V1 segment (series 7, image 290) where there is mild stenosis. No other stenosis along the course of both vertebral arteries to the skull base. CTA HEAD Posterior circulation: Moderate left vertebral artery soft plaque and stenosis series 7, image 155. This is proximal to the left PICA origin which remains patent. Patent vertebrobasilar junction. No distal right vertebral stenosis. Patent basilar artery without stenosis. Patent SCA and PCA origins. Posterior communicating arteries are diminutive or absent. Bilateral PCA branches appear fairly symmetric and within normal limits. No proximal left PCA occlusion. Anterior circulation: Bilateral ICA siphon atherosclerosis, with up to moderate plaque. Mild to moderate supraclinoid left ICA stenosis series 9, image 101. Contralateral right ICA siphon stenosis. Patent carotid termini. Patent MCA and ACA origins. Anterior communicating artery and bilateral ACA branches are within normal limits. Median artery of the corpus callosum (normal variant). Right MCA M1 segment and trifurcation are patent without stenosis. Right MCA branches are within normal limits. Left MCA M1 segment and left MCA trifurcation are patent. No left MCA M2 branch occlusion is identified. However, there is evidence of middle division M3 branch occlusion on series 12, image 26, corresponding to the level of the operculum.  Venous sinuses: Grossly patent. Anatomic variants: Median artery of the corpus callosum. Review of the MIP images confirms the above findings Preliminary report of the above discussed by telephone with Dr. Jaci Carrel on 08/06/2022 at 05:22 . IMPRESSION: 1. CT Perfusion is positive for left MCA territory oligemia, proximally 120 mL. No infarct core detected by strict criteria, but abnormal CBV at the left operculum. 2. No large vessel occlusion identified on CTA, but left M3 operculum branch occlusion(s) suspected. Still, given the CTP findings and presentation recommend discussion with Neurology for consideration of NIR intervention. 3. The above was discussed by telephone with Dr. Jaci Carrel in the ED on 08/06/2022 at 0522 hours, and also Dr. Iver Nestle at [phone] hours. 4. 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 Bilateral ICA siphon plaque with up to moderate Left supraclinoid stenosis. Moderate Left Vertebral V4 stenosis. No proximal Left PCA branch occlusion (small age indeterminate left occipital pole infarct on plain CT). Electronically Signed   By: Odessa Fleming M.D.   On: 08/06/2022 05:34   CT Cervical Spine Wo Contrast  Result Date: 08/06/2022 CLINICAL DATA:  71 year old male code stroke patient. Right side weakness. EXAM: CT CERVICAL SPINE WITHOUT CONTRAST TECHNIQUE: Multidetector CT imaging of the cervical spine was performed without intravenous contrast. Multiplanar CT image reconstructions  were also generated. RADIATION DOSE REDUCTION: This exam was performed according to the departmental dose-optimization program which includes automated exposure control, adjustment of the mA and/or kV according to patient size and/or use of iterative reconstruction technique. COMPARISON:  Plain head CT the same time reported separately. FINDINGS: Alignment: Straightening of cervical lordosis. Cervicothoracic junction alignment is within normal limits. Bilateral posterior element  alignment is within normal limits. Skull base and vertebrae: Visualized skull base is intact. No atlanto-occipital dissociation. C1 and C2 appear intact and aligned. No acute osseous abnormality identified. Soft tissues and spinal canal: No prevertebral fluid or swelling. No visible canal hematoma. Elongated stylohyoid ligament calcification greater on the left. Disc levels: Widespread advanced cervical spine degeneration. Multilevel mild and up to moderate cervical spinal stenosis (series 9, image 31). Upper chest: Mild apical lung scarring. Chronic appearing mild T1 superior endplate deformity, Schmorl's node. Other: 14 mm left thyroid nodule, Not clinically significant; no follow-up imaging recommended (ref: J Am Coll Radiol. 2015 Feb;12(2): 143-50). IMPRESSION: 1. No acute traumatic injury identified in the cervical spine. 2. 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 Electronically Signed   By: Odessa Fleming M.D.   On: 08/06/2022 05:19   CT HEAD CODE STROKE WO CONTRAST  Result Date: 08/06/2022 CLINICAL DATA:  Code stroke. 71 year old male. Fall at 0300 hours. Right side weakness and abnormal speech. EXAM: CT HEAD WITHOUT CONTRAST TECHNIQUE: Contiguous axial images were obtained from the base of the skull through the vertex without intravenous contrast. RADIATION DOSE REDUCTION: This exam was performed according to the departmental dose-optimization program which includes automated exposure control, adjustment of the mA and/or kV according to patient size and/or use of iterative reconstruction technique. COMPARISON:  None Available. FINDINGS: Brain: Cerebral volume is within normal limits for age. No midline shift, mass effect, or evidence of intracranial mass lesion. No ventriculomegaly. No acute intracranial hemorrhage identified. Patchy and confluent bilateral cerebral white matter hypodensity, fairly symmetric. No cortically based acute infarct  identified in the left MCA territory. But there is confluent hypodensity in the left occipital pole in a small area on series 5, image 16 (about 15-20 mm). Vascular: Calcified atherosclerosis at the skull base. No suspicious intracranial vascular hyperdensity. Skull: No acute osseous abnormality identified. Sinuses/Orbits: Visualized paranasal sinuses and mastoids are clear. Other: Leftward gaze. Scalp soft tissues appear within normal limits. ASPECTS [facility] Stroke Program Early CT Score) Total score (0-10 with 10 being normal): 10 (cytotoxic edema only in the left PCA territory). IMPRESSION: 1. Leftward gaze deviation, raising the possibility of cortical ischemia in this setting. No Left MCA territory cortical infarct changes are identified. But there is a small age indeterminate Left PCA infarct at the left occipital pole. 2. No acute intracranial hemorrhage identified. ASPECTS 10. 3. Advanced bilateral white matter changes most commonly due to small vessel disease. Study discussed by telephone with Dr. Jaci Carrel on 08/06/2022 at 05:11 . Electronically Signed   By: Odessa Fleming M.D.   On: 08/06/2022 05:11    Procedures .Critical Care  Performed by: Gilda Crease, MD Authorized by: Gilda Crease, MD   Critical care provider statement:    Critical care time (minutes):  30   Critical care was necessary to treat or prevent imminent or life-threatening deterioration of the following conditions:  CNS failure or compromise   Critical care was time spent personally by me on the following activities:  Development of treatment plan with patient or surrogate, discussions with consultants,  evaluation of patient's response to treatment, examination of patient, ordering and review of laboratory studies, ordering and review of radiographic studies, ordering and performing treatments and interventions, pulse oximetry, re-evaluation of patient's condition and review of old charts   I assumed  direction of critical care for this patient from another provider in my specialty: no     Care discussed with: accepting provider at another facility       Medications Ordered in ED Medications  iohexol (OMNIPAQUE) 350 MG/ML injection 100 mL (100 mLs Intravenous Contrast Given 08/06/22 0454)  LORazepam (ATIVAN) injection 1 mg (1 mg Intravenous Given 08/06/22 0504)    ED Course/ Medical Decision Making/ A&P                           Medical Decision Making Amount and/or Complexity of Data Reviewed Labs: ordered. Radiology: ordered.  Risk Prescription drug management.   Brought to the emergency department after a fall.  Patient noted to have significant right-sided deficit, left gaze preference with inability to cross midline with his eyes.  Patient presentation consistent with large vessel occlusion.  At arrival to the emergency department, last known normal was not clear.  He was therefore not a candidate for thrombolytics.  It was known that he was well when he went to bed, bedtime was not initially known.  Ultimately it was found out that he went to bed around 11:30 PM.  His presentation at 4:30 AM, therefore, was outside the window.  Patient did not have an obvious infarct on his screening CT.  He underwent CT angiography, CT perfusion study.  He does have a large penumbra in the left MCA territory.  It is felt that he has an M3 occlusion, discussed at length with Dr. Nevada Crane, radiology.  Recommendation by Tele-Neurology and Dr. Curly Shores, Neurology at [facility] is transfer to [facility] for IR.  Arranging EMS for emergency transfer        Final Clinical Impression(s) / ED Diagnoses Final diagnoses:  Cerebrovascular accident (CVA) due to occlusion of left middle cerebral artery [facility])    Rx / DC Orders ED Discharge Orders     None         Zoltan Genest, Gwenyth Allegra, MD 08/06/22 [phone]

## 2022-08-06 NOTE — Progress Notes (Addendum)
STROKE TEAM PROGRESS NOTE   INTERVAL HISTORY No family at the bedside. RN at bedside. Just arrived to unit from IR for a left M3 occlusion with TICI 2C, with Extravasation confirmed on flat panel CT. Delay angiograms showed no further extravasation, stable for over 30 minutes. Cleviprex is @ 3mg  for a SBP goal of  120-140 for 24 hrs.  He is lethargic. Not following commands, aphasic. No right arm movement, withdraws. Left side antigravity purposeful. No blink on left. Disconjugate gaze Right facial droop grimaces. Right moderate hemiparesis but able to move some.  Continue NS @ 59ml. Keep NPO  Repeat CTh Density overlying the left cerebral convexity is grossly similar to the intra procedural images obtained earlier the same day and is favored to reflect predominantly contrast extravasation; however, superimposed subarachnoid hemorrhage can not be entirely excluded. 2. Small focus of hypodensity in the left frontal lobe cortex suspicious for small evolving infarct.     Vitals:   08/06/22 0915 08/06/22 0925 08/06/22 0935 08/06/22 0945  BP: (!) 145/94  (!) 149/92 139/87  Pulse: 70 75 71 86  Resp: 18 17 18  (!) 23  Temp:  (!) 97 F (36.1 C)    TempSrc:      SpO2: 97% 98% 97% 98%  Weight:      Height:       CBC:  Recent Labs  Lab 08/06/22 0437  WBC 4.2  NEUTROABS 2.7  HGB 13.7  HCT 40.5  MCV 96.0  PLT [id number]   Basic Metabolic Panel:  Recent Labs  Lab 08/06/22 0437  NA 143  K 4.3  CL 109  CO2 27  GLUCOSE 114*  BUN 10  CREATININE 1.00  CALCIUM 9.4   Lipid Panel: No results for input(s): "CHOL", "TRIG", "HDL", "CHOLHDL", "VLDL", "LDLCALC" in the last 168 hours. HgbA1c: No results for input(s): "HGBA1C" in the last 168 hours. Urine Drug Screen: No results for input(s): "LABOPIA", "COCAINSCRNUR", "LABBENZ", "AMPHETMU", "THCU", "LABBARB" in the last 168 hours.  Alcohol Level  Recent Labs  Lab 08/06/22 0437  ETH <10    IMAGING past 24 hours CT ANGIO HEAD NECK W WO CM W  PERF (CODE STROKE)  Result Date: 08/06/2022 CLINICAL DATA:  71 year old male code stroke presentation, left MCA symptomatology. EXAM: CT ANGIOGRAPHY HEAD AND NECK CT PERFUSION BRAIN TECHNIQUE: Multidetector CT imaging of the head and neck was performed using the standard protocol during bolus administration of intravenous contrast. Multiplanar CT image reconstructions and MIPs were obtained to evaluate the vascular anatomy. Carotid stenosis measurements (when applicable) are obtained utilizing NASCET criteria, using the distal internal carotid diameter as the denominator. Multiphase CT imaging of the brain was performed following IV bolus contrast injection. Subsequent parametric perfusion maps were calculated using RAPID software. RADIATION DOSE REDUCTION: This exam was performed according to the departmental dose-optimization program which includes automated exposure control, adjustment of the mA and/or kV according to patient size and/or use of iterative reconstruction technique. CONTRAST:  13mL OMNIPAQUE IOHEXOL 350 MG/ML SOLN COMPARISON:  Plain head CT 0449 hours today. CT cervical spine reported separately. FINDINGS: CT Brain Perfusion Findings: ASPECTS: Ten CBF (<30%) Volume: 64mL. CBV parameter changes of 13-16 mL at the left operculum. The Perfusion (Tmax>6.0s) volume: 175mL.  Hypoperfusion index 0.2. Mismatch Volume: 131mL Infarction Location:Left MCA territory CTA NECK Skeleton: Cervical spine CT detailed separately. No acute osseous abnormality identified. Upper chest: Mild dependent atelectasis. Mild retained secretions in the upper thoracic esophagus. Other neck: Left thyroid nodules smaller than 1.5 cm Not  clinically significant; no follow-up imaging recommended (ref: J Am Coll Radiol. 2015 Feb;12(2): 143-50).Otherwise negative. Aortic arch: Calcified aortic atherosclerosis. 3 vessel arch configuration. Right carotid system: Minimal plaque. Mild motion artifact distal to the right ICA bulb. No  stenosis. Left carotid system: Mild tortuosity and minimal plaque. No stenosis. Vertebral arteries: Minimal plaque except at the left vertebral artery origin and V1 segment (series 7, image 290) where there is mild stenosis. No other stenosis along the course of both vertebral arteries to the skull base. CTA HEAD Posterior circulation: Moderate left vertebral artery soft plaque and stenosis series 7, image 155. This is proximal to the left PICA origin which remains patent. Patent vertebrobasilar junction. No distal right vertebral stenosis. Patent basilar artery without stenosis. Patent SCA and PCA origins. Posterior communicating arteries are diminutive or absent. Bilateral PCA branches appear fairly symmetric and within normal limits. No proximal left PCA occlusion. Anterior circulation: Bilateral ICA siphon atherosclerosis, with up to moderate plaque. Mild to moderate supraclinoid left ICA stenosis series 9, image 101. Contralateral right ICA siphon stenosis. Patent carotid termini. Patent MCA and ACA origins. Anterior communicating artery and bilateral ACA branches are within normal limits. Median artery of the corpus callosum (normal variant). Right MCA M1 segment and trifurcation are patent without stenosis. Right MCA branches are within normal limits. Left MCA M1 segment and left MCA trifurcation are patent. No left MCA M2 branch occlusion is identified. However, there is evidence of middle division M3 branch occlusion on series 12, image 26, corresponding to the level of the operculum. Venous sinuses: Grossly patent. Anatomic variants: Median artery of the corpus callosum. Review of the MIP images confirms the above findings Preliminary report of the above discussed by telephone with Dr. Joseph Berkshire on 08/06/2022 at 05:22 . IMPRESSION: 1. CT Perfusion is positive for left MCA territory oligemia, proximally 120 mL. No infarct core detected by strict criteria, but abnormal CBV at the left operculum. 2.  No large vessel occlusion identified on CTA, but left M3 operculum branch occlusion(s) suspected. Still, given the CTP findings and presentation recommend discussion with Neurology for consideration of NIR intervention. 3. The above was discussed by telephone with Dr. Joseph Berkshire in the ED on 08/06/2022 at 0522 hours, and also Dr. Curly Shores at [phone] hours. 4. 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 Bilateral ICA siphon plaque with up to moderate Left supraclinoid stenosis. Moderate Left Vertebral V4 stenosis. No proximal Left PCA branch occlusion (small age indeterminate left occipital pole infarct on plain CT). Electronically Signed   By: Genevie Ann M.D.   On: 08/06/2022 05:34   CT Cervical Spine Wo Contrast  Result Date: 08/06/2022 CLINICAL DATA:  71 year old male code stroke patient. Right side weakness. EXAM: CT CERVICAL SPINE WITHOUT CONTRAST TECHNIQUE: Multidetector CT imaging of the cervical spine was performed without intravenous contrast. Multiplanar CT image reconstructions were also generated. RADIATION DOSE REDUCTION: This exam was performed according to the departmental dose-optimization program which includes automated exposure control, adjustment of the mA and/or kV according to patient size and/or use of iterative reconstruction technique. COMPARISON:  Plain head CT the same time reported separately. FINDINGS: Alignment: Straightening of cervical lordosis. Cervicothoracic junction alignment is within normal limits. Bilateral posterior element alignment is within normal limits. Skull base and vertebrae: Visualized skull base is intact. No atlanto-occipital dissociation. C1 and C2 appear intact and aligned. No acute osseous abnormality identified. Soft tissues and spinal canal: No prevertebral fluid or swelling. No visible canal hematoma. Elongated stylohyoid ligament calcification greater on  the left. Disc levels: Widespread advanced cervical spine degeneration.  Multilevel mild and up to moderate cervical spinal stenosis (series 9, image 31). Upper chest: Mild apical lung scarring. Chronic appearing mild T1 superior endplate deformity, Schmorl's node. Other: 14 mm left thyroid nodule, Not clinically significant; no follow-up imaging recommended (ref: J Am Coll Radiol. 2015 Feb;12(2): 143-50). IMPRESSION: 1. No acute traumatic injury identified in the cervical spine. 2. 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 Electronically Signed   By: Genevie Ann M.D.   On: 08/06/2022 05:19   CT HEAD CODE STROKE WO CONTRAST  Result Date: 08/06/2022 CLINICAL DATA:  Code stroke. 71 year old male. Fall at 0300 hours. Right side weakness and abnormal speech. EXAM: CT HEAD WITHOUT CONTRAST TECHNIQUE: Contiguous axial images were obtained from the base of the skull through the vertex without intravenous contrast. RADIATION DOSE REDUCTION: This exam was performed according to the departmental dose-optimization program which includes automated exposure control, adjustment of the mA and/or kV according to patient size and/or use of iterative reconstruction technique. COMPARISON:  None Available. FINDINGS: Brain: Cerebral volume is within normal limits for age. No midline shift, mass effect, or evidence of intracranial mass lesion. No ventriculomegaly. No acute intracranial hemorrhage identified. Patchy and confluent bilateral cerebral white matter hypodensity, fairly symmetric. No cortically based acute infarct identified in the left MCA territory. But there is confluent hypodensity in the left occipital pole in a small area on series 5, image 16 (about 15-20 mm). Vascular: Calcified atherosclerosis at the skull base. No suspicious intracranial vascular hyperdensity. Skull: No acute osseous abnormality identified. Sinuses/Orbits: Visualized paranasal sinuses and mastoids are clear. Other: Leftward gaze. Scalp soft tissues appear  within normal limits. ASPECTS [facility] Stroke Program Early CT Score) Total score (0-10 with 10 being normal): 10 (cytotoxic edema only in the left PCA territory). IMPRESSION: 1. Leftward gaze deviation, raising the possibility of cortical ischemia in this setting. No Left MCA territory cortical infarct changes are identified. But there is a small age indeterminate Left PCA infarct at the left occipital pole. 2. No acute intracranial hemorrhage identified. ASPECTS 10. 3. Advanced bilateral white matter changes most commonly due to small vessel disease. Study discussed by telephone with Dr. Joseph Berkshire on 08/06/2022 at 05:11 . Electronically Signed   By: Genevie Ann M.D.   On: 08/06/2022 05:11    PHYSICAL EXAM  Temp:  [97 F (36.1 C)-98.4 F (36.9 C)] 97 F (36.1 C) (08/17 0925) Pulse Rate:  [68-115] 75 (08/17 1345) Resp:  [12-30] 17 (08/17 1345) BP: (114-150)/(72-100) 116/91 (08/17 1345) SpO2:  [89 %-100 %] 97 % (08/17 1345) Weight:  [72 kg] 72 kg (08/17 0438)  General - critically ill male in NAD.  Cardiovascular - Regular rhythm and rate.  Mental Status -  He is lethargic Not following commands, aphasic. No right arm movement, withdraws. Left side antigravity purposeful. No blink on left. Dysconjugate gaze Right facial droop grimaces to noxious stimuli. Right hemiparesis but has some movement to pain Gait and Station - deferred.   ASSESSMENT/PLAN Mr. Kebin Merrick is a 71 y.o. male with history of   Dustin Blanchard is a 71 y.o. male with a past medical history significant for hypertension, hyperlipidemia, sinus bradycardia, BPH   He was his normal state of health when he went to bed at 2330.  At approximately 2:58 AM his wife heard him grunting and found him on the floor in his urine.   At baseline he is continent,  ambulates without assistance and still drives, although he has been encouraged to always drive with his wife in the car as he does sometimes get lost.  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 Leftward gaze deviation, raising the possibility of cortical ischemia in this setting. No Left MCA territory cortical infarct changes are identified. But there is a small age indeterminate Left PCA infarct at the left occipital pole. 2. No acute intracranial hemorrhage identified. ASPECTS 10. 3. Advanced bilateral white matter changes most commonly due to small vessel disease CTA head & neck with CT perfusion  1. CT Perfusion is positive for left MCA territory oligemia, proximally 120 mL. No infarct core detected by strict criteria, but abnormal CBV at the left operculum. 2. No large vessel occlusion identified on CTA, but left M3 operculum branch occlusion(s) suspected. Still, given the CTP findings and presentation recommend discussion with Neurology for consideration of NIR intervention. 3. 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 Bilateral ICA siphon plaque with up to moderate Left supraclinoid stenosis. Moderate Left Vertebral V4 stenosis. No proximal Left PCA branch occlusion (small age indeterminate left occipital pole infarct on plain CT). Cerebral angio/Post IR CT  1. 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 2. Capillary hyperemia along patent anterior left parietal M3 branch with early venous drainage suggestive of ongoing ischemia. 3. Access to occluded branch with microwire resulted in recanalization (TICI 2C) with small contrast extravasation which resolved spontaneously. Delay flat panel CT and cerebral angiogram showed no further contrast extravasation. Repeat CT H  1. Density overlying the left cerebral convexity is grossly similar to the intra procedural  images obtained earlier the same day and is favored to reflect predominantly contrast extravasation; however, superimposed subarachnoid hemorrhage can not be entirely excluded. 2. Small focus of hypodensity in the left frontal lobe cortex suspicious for small evolving infarct MRI  pending 2D Echo pending LDL pending HgbA1c pending VTE prophylaxis - SCD's    Diet   Diet NPO time specified   No antithrombotic prior to admission, now on No antithrombotic.  Therapy recommendations:  pending Disposition:  pending  Hypertension Home meds:  none  UnStable On Cleviprex gtt  SBP goal 120-140 for 24 hrs s/p thrombectomy Long-term BP goal normotensive  Hyperlipidemia Home meds:  atorvastatin,  LDL pending goal < 70 Continue statin at discharge   Other Stroke Risk Factors Advanced Age >/= 72   Other Active Problems Dementia  Dysphagia   Hospital day # 0  Gevena Mart DNP, ACN[facility]-AG    STROKE MD NOTE : I have personally obtained history,examined this patient, reviewed notes, independently viewed imaging studies, participated in medical decision making and plan of care.ROS completed by me personally and pertinent positives fully documented  I have made any additions or clarifications directly to the above note. Agree with note above.  Patient presented with aphasia due to left M3 occlusion initially not seen on CT angio with CT perfusion showed significant perfusion deficit meriting emergent diagnostic cerebral catheter angiogram which showed left M3 occlusion and attempt at mechanical thrombectomy resulted date in subarachnoid hemorrhage.  Patient remains aphasic with right hemiparesis.  Recommend strict control of blood pressure with systolic goal 120-140 for the first 24 hours and then below 160.  Close neurological observation as per postlumpectomy protocol.  Repeat CT scan of the head in a few hours confirm subarachnoid hemorrhage versus dye extravasation.  No family available at the  bedside.  Discussed with Dr. Sherlon Handing interventional neuroradiologist. This patient is critically ill and at significant risk of neurological worsening, death and care requires constant monitoring of vital signs, hemodynamics,respiratory and cardiac monitoring, extensive review of multiple databases, frequent neurological assessment, discussion with family, other specialists and medical decision making of high complexity.I have made any additions or clarifications directly to the above note.This critical care time does not reflect procedure time, or teaching time or supervisory time of PA/NP/Med Resident etc but could involve care discussion time.  I spent 35 minutes of neurocritical care time  in the care of  this patient.     Delia Heady, MD Medical Director [facility] [place] [facility] Pager: [phone] 08/06/2022 2:49 PM  To contact Stroke Continuity provider, please refer to [email or web address]. After hours, contact General Neurology

## 2022-08-06 NOTE — H&P (Signed)
Neurology H&P  CC: Right sided weakness and aphasia  History is obtained from:  HPI: Dustin Blanchard is a 71 y.o. male with a past medical history significant for hypertension, hyperlipidemia, sinus bradycardia, BPH  He was his normal state of health when he went to bed at 2330.  At approximately 2:58 AM his wife heard him grunting and found him on the floor in his urine.  At baseline he is continent, ambulates without assistance and still drives, although he has been encouraged to always drive with his wife in the car as he does sometimes get lost.  He is independent in all of his ADLs but does require assistance with his IADLs  Per family no other recent symptoms or concerns  CT perfusion imaging was completed and on reviewing this push notification to my phone I logged onto the telespecialist call to assist with transfer in case it was indicated based on their evaluation; of note he did require 1 mg of Ativan to facilitate imaging  Please note, per telestroke nurse note: "Code stroke activated at 0433; Pt to CT at 0443 and back at 0510. TS paged at [place] and Dr Gaynell Face on screen at [phone]. Dr Curly Shores [facility] Neurologist) also joined stroke Cart. 0530 Dr Betsey Holiday at bedside to give Dr Gaynell Face CTA/perfusion read and advised that he had already contacted [facility] to speak with neurology at [facility] regarding transfer. After [facility] disconnected, Dr Curly Shores did speak with family regarding transfer to [facility] for possible Thrombectomy."  LKW: 2330 tPA given?: No, due to out of the window IA performed?:  Yes Premorbid modified rankin scale:      3 - Moderate disability. Requires some help, but able to walk unassisted.   ROS: Unable to obtain secondary to mental status  Past Medical History:  Diagnosis Date   Dementia [facility])    Past Surgical History:  Procedure Laterality Date   HERNIA REPAIR     HIP SURGERY     No current outpatient medications on file Per family patient is on vitamin D,  tamsulosin, atorvastatin (started for diagnosis of vascular dementia), but not on aspirin.  Additionally they considered starting memantine but he declined using this medication  No family history on file.  Social History:  reports that he has quit smoking. He has never used smokeless tobacco. He reports that he does not drink alcohol. No history on file for drug use.   Exam: Current vital signs: BP 129/87   Pulse 69   Temp 98.4 F (36.9 C) (Oral)   Resp 18   Ht 6' (1.829 m)   Wt 72 kg   SpO2 97%   BMI 21.53 kg/m  Vital signs in last 24 hours: Temp:  [98.4 F (36.9 C)] 98.4 F (36.9 C) (08/17 0439) Pulse Rate:  [69-97] 69 (08/17 0530) Resp:  [18-22] 18 (08/17 0530) BP: (129-137)/(76-94) 129/87 (08/17 0530) SpO2:  [95 %-97 %] 97 % (08/17 0530) Weight:  [72 kg] 72 kg (08/17 0438)   Examination per telespecialists, partially observed by myself via video: 1A: Level of Consciousness - Alert; keenly responsive + 0 1B: Ask Month and Age - Aphasic + 2 1C: Blink Eyes & Squeeze Hands - Performs Both Tasks + 0 2: Test Horizontal Extraocular Movements - Forced Gaze Palsy: Cannot Be Overcome + 2 3: Test Visual Fields - Complete Hemianopia + 2 4: Test Facial Palsy (Use Grimace if Obtunded) - Partial paralysis (lower face) + 2 5A: Test Left Arm Motor Drift - No Drift  for 10 Seconds + 0 5B: Test Right Arm Motor Drift - Some Effort Against Gravity + 2 6A: Test Left Leg Motor Drift - No Drift for 5 Seconds + 0 6B: Test Right Leg Motor Drift - Drift, but doesn't hit bed + 1 7: Test Limb Ataxia (FNF/Heel-Shin) - No Ataxia + 0 8: Test Sensation - Complete Loss: Cannot Sense Being Touched At All + 2 9: Test Language/Aphasia - Mute/Global Aphasia: No Usable Speech/Auditory Comprehension + 3 10: Test Dysarthria - Severe Dysarthria: Unintelligble Slurring or Out of Proportion to Aphasia + 2 11: Test Extinction/Inattention - No abnormality + 0 NIHSS Score: 18  Full examination not completed in  person as patient was emergently taken to IR on arrival to [facility]   I have reviewed labs in epic and the results pertinent to this consultation are:  Basic Metabolic Panel: Recent Labs  Lab 08/06/22 0437  NA 143  K 4.3  CL 109  CO2 27  GLUCOSE 114*  BUN 10  CREATININE 1.00  CALCIUM 9.4    CBC: Recent Labs  Lab 08/06/22 0437  WBC 4.2  NEUTROABS 2.7  HGB 13.7  HCT 40.5  MCV 96.0  PLT 210    Coagulation Studies: Recent Labs    08/06/22 0437  LABPROT 15.2  INR 1.2      I have reviewed the images obtained:  CT head personally reviewed, agree with radiology:   1. Leftward gaze deviation, raising the possibility of cortical ischemia in this setting. No Left MCA territory cortical infarct changes are identified. But there is a small age indeterminate Left PCA infarct at the left occipital pole. 2. No acute intracranial hemorrhage identified. ASPECTS 10. 3. Advanced bilateral white matter changes most commonly due to small vessel disease.  CTA / CTP personally reviewed, agree with radiology:   1. CT Perfusion is positive for left MCA territory oligemia, proximally 120 mL. No infarct core detected by strict criteria, but abnormal CBV at the left operculum. 2. No large vessel occlusion identified on CTA, but left M3 operculum branch occlusion(s) suspected. Still, given the CTP findings and presentation recommend discussion with Neurology for consideration of NIR intervention. 3. The above was discussed by telephone with Dr. Joseph Berkshire in the ED on 08/06/2022 at 0522 hours, and also Dr. Curly Shores at [phone] hours. 4. 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 Bilateral ICA siphon plaque with up to moderate Left supraclinoid stenosis. Moderate Left Vertebral V4 stenosis. No proximal Left PCA branch occlusion (small age indeterminate left occipital pole infarct on plain CT).  Impression: Left MCA territory stroke out of the window for  acute intervention with thrombolytic but favorable for thrombectomy based on CT perfusion imaging and ASPECTS score  Recommendations: # Left MCA territory stroke - Stroke labs TSH, ESR, RPR, HgbA1c, fasting lipid panel - MRI brain and MRA post-thrombectomy (to be ordered) - Frequent neuro checks - Echocardiogram - Hold antiplatelets pending procedure, start aspirin and possibly Plavix if no significant hemorrhagic complications - Home statin dose to be adjusted if needed for LDL goal less than 70 - Risk factor modification - Telemetry monitoring;  - Blood pressure goal   - Post successful uncomplicated revascularization SBP 120 - 140 for 24 hours; if complications have arisen or only partial revascularization reach out to interventionalist or neurologist on call for BP goal - PT consult, OT consult, Speech consult, unless patient is back to baseline - Admitted to the stroke team, please page stroke MD with any questions or  concerns   Lesleigh Noe MD-PhD [facility] [phone] Available 7 PM to 7 AM, outside of these hours please call Neurologist on call as listed on Amion.  Total critical care time: 50 minutes   Critical care time was exclusive of separately billable procedures and treating other patients.   Critical care was necessary to treat or prevent imminent or life-threatening deterioration.   Critical care was time spent personally by me on the following activities: development of treatment plan with patient and/or surrogate as well as nursing, discussions with consultants/primary team, evaluation of patient's response to treatment, examination of patient, obtaining history from patient or surrogate, ordering and performing treatments and interventions, ordering and review of laboratory studies, ordering and review of radiographic studies, and re-evaluation of patient's condition as needed, as documented above.

## 2022-08-06 NOTE — Anesthesia Postprocedure Evaluation (Signed)
Anesthesia Post Note  Patient: Dustin Blanchard  Procedure(s) Performed: IR WITH ANESTHESIA     Patient location during evaluation: PACU Anesthesia Type: General Level of consciousness: awake, patient uncooperative and responds to stimulation Pain management: pain level controlled Vital Signs Assessment: post-procedure vital signs reviewed and stable Respiratory status: spontaneous breathing, nonlabored ventilation, respiratory function stable and patient connected to nasal cannula oxygen Cardiovascular status: blood pressure returned to baseline and stable Postop Assessment: no apparent nausea or vomiting Anesthetic complications: no   No notable events documented.  Last Vitals:  Vitals:   08/06/22 0935 08/06/22 0945  BP: (!) 149/92 139/87  Pulse: 71 86  Resp: 18 (!) 23  Temp:    SpO2: 97% 98%    Last Pain:  Vitals:   08/06/22 0439  TempSrc: Oral  PainSc:    Pain Goal:    LLE Motor Response: Purposeful movement (08/06/22 0935)   RLE Motor Response: Purposeful movement (08/06/22 0935)          Cheyane Ayon A.

## 2022-08-06 NOTE — ED Notes (Signed)
EMS called Code Stroke in field @ [phone].

## 2022-08-06 NOTE — Transfer of Care (Signed)
Immediate Anesthesia Transfer of Care Note  Patient: Dustin Blanchard  Procedure(s) Performed: IR WITH ANESTHESIA  Patient Location: PACU  Anesthesia Type:General  Level of Consciousness: awake, alert  and oriented  Airway & Oxygen Therapy: Patient Spontanous Breathing and Patient connected to face mask oxygen  Post-op Assessment: Report given to RN and Post -op Vital signs reviewed and stable  Post vital signs: Reviewed and stable  Last Vitals:  Vitals Value Taken Time  BP 146/89 08/06/22 0839  Temp    Pulse 71 08/06/22 0843  Resp 22 08/06/22 0843  SpO2 95 % 08/06/22 0843  Vitals shown include unvalidated device data.  Last Pain:  Vitals:   08/06/22 0439  TempSrc: Oral  PainSc:          Complications: No notable events documented.

## 2022-08-06 NOTE — Consult Note (Signed)
[facility] TeleNeurology Consult Services   Patient Name:   Blanchard, Dustin Date of Birth:   February 11, 1951 Identification Number:   MRN - [id number] Date of Service:   08/06/2022 05:10:54  Impression:      This is a 71 year old man with a left M3 occlusion. Patient has global aphasia and right-sided weakness. He is outside of the window for thrombolytic therapy. Neuro interventional has been called by the emergency room. We are awaiting their response regarding potential transfer. I would treat him with dual antiplatelet therapy. I recommend MRI of the brain.  Discussed with NIR Text: Emergency room physician has called neuro interventional. There is a left M3 occlusion. We are awaiting decision to see if the patient is appropriate for transfer.  Our recommendations are outlined below.  Recommendations:        Stroke/Telemetry Floor       Neuro Checks       Bedside Swallow Eval       DVT Prophylaxis       IV Fluids, Normal Saline       Head of Bed 30 Degrees       Euglycemia and Avoid Hyperthermia (PRN Acetaminophen)       Initiate dual antiplatelet therapy with Aspirin 81 mg daily and Clopidogrel 75 mg daily.  Per facility request will defer further work up, management, and referrals to inpatient service, inclusive of inpatient neurology consult.  Sign Out:       Discussed with Emergency Department Provider    ------------------------------------------------------------------------------  Advanced Imaging: CTA Head and Neck Completed.  CTP Completed.  LVO:Yes  Discussed with NIR :No  Discussed with NIR Text : Emergency room physician has called neuro interventional. There is a left M3 occlusion. We are awaiting decision to see if the patient is appropriate for transfer.   Metrics: Last Known Well: 08/05/2022 23:30:00 [facility] Notification Time: 08/06/2022 05:10:54 Arrival Time: 08/06/2022 04:35:00 Stamp Time: 08/06/2022 05:10:54 Initial  Response Time: 08/06/2022 05:15:14 Symptoms: right sided weakness . Initial patient interaction: 08/06/2022 05:22:45 NIHSS Assessment Completed: 08/06/2022 05:22:50 Patient is not a candidate for Thrombolytic. Thrombolytic Medical Decision: 08/06/2022 05:22:48 Patient was not deemed candidate for Thrombolytic because of following reasons: Last Well Known Above 4.5 Hours.  CT head showed no acute hemorrhage or acute core infarct.  Primary Provider Notified of Diagnostic Impression and Management Plan on: 08/06/2022 05:34:38    ------------------------------------------------------------------------------  History of Present Illness: Patient is a 71 year old Male.  Patient was brought by EMS for symptoms of right sided weakness . This is a 71 year old African-American man who presents to the hospital with chief complaint of right-sided weakness. The patient was well last evening. He does a history of mild dementia. The patient went to bed at 2330. He was found on the floor sometime after 3:00 in the morning. He is weak on the right side of his body. He was unable to speak. He was brought to the hospital.    Past Medical History: [place] PMH:  vascular dementia  Medications:  No Anticoagulant use   Reviewed EMR for current medications  Allergies:  NKDA  Social History: Drug Use: No  Family History:  There is no family history of premature cerebrovascular disease pertinent to this consultation  ROS : 14 Points Review of Systems was performed and was negative except mentioned in HPI.  Past Surgical History: There Is No Surgical History Contributory To Today's Visit     Examination: Blood Glucose(112) 1A: Level of Consciousness - Alert; keenly  responsive + 0 1B: Ask Month and Age - Aphasic + 2 1C: Blink Eyes & Squeeze Hands - Performs Both Tasks + 0 2: Test Horizontal Extraocular Movements - Forced Gaze Palsy: Cannot Be Overcome + 2 3: Test Visual Fields - Complete  Hemianopia + 2 4: Test Facial Palsy (Use Grimace if Obtunded) - Partial paralysis (lower face) + 2 5A: Test Left Arm Motor Drift - No Drift for 10 Seconds + 0 5B: Test Right Arm Motor Drift - Some Effort Against Gravity + 2 6A: Test Left Leg Motor Drift - No Drift for 5 Seconds + 0 6B: Test Right Leg Motor Drift - Drift, but doesn't hit bed + 1 7: Test Limb Ataxia (FNF/Heel-Shin) - No Ataxia + 0 8: Test Sensation - Complete Loss: Cannot Sense Being Touched At All + 2 9: Test Language/Aphasia - Mute/Global Aphasia: No Usable Speech/Auditory Comprehension + 3 10: Test Dysarthria - Severe Dysarthria: Unintelligble Slurring or Out of Proportion to Aphasia + 2 11: Test Extinction/Inattention - No abnormality + 0  NIHSS Score: 18   Pre-Morbid Modified Rankin Scale: 2 Points = Slight disability; unable to carry out all previous activities, but able to look after own affairs without assistance  Spoke with : Dr. Blinda Leatherwood  Patient/Family was informed the Neurology Consult would occur via TeleHealth consult by way of interactive audio and video telecommunications and consented to receiving care in this manner.   Patient is being evaluated for possible acute neurologic impairment and high probability of imminent or life-threatening deterioration. I spent total of 35 minutes providing care to this patient, including time for face to face visit via telemedicine, review of medical records, imaging studies and discussion of findings with providers, the patient and/or family.   Dr Raynelle Dick   [facility] For Inpatient follow-up with [facility] physician please call [facility] [phone]. This is not an outpatient service. Post hospital discharge, please contact hospital directly.

## 2022-08-06 NOTE — Progress Notes (Signed)
Code stroke activated at 0433; Pt to CT at 0443 and back at 0510. TS paged at 0510 and Dr Dewayne Hatch on screen at [phone]. Dr Iver Nestle [facility] Neurologist) also joined stroke Cart. 0530 Dr Blinda Leatherwood at bedside to give Dr Dewayne Hatch CTA/perfusion read and advised that he had already contacted [facility] to speak with neurology at [facility] regarding transfer. After [facility] disconnected, Dr Iver Nestle did speak with family regarding transfer to [facility] for possible Thrombectomy.  Ricci Barker, Multimedia programmer

## 2022-08-06 NOTE — ED Notes (Signed)
Beeped and Called out Code Stroke to CT and Lab @ [phone].

## 2022-08-06 NOTE — ED Notes (Signed)
Delay in [facility] transport. ETA 20 minutes

## 2022-08-06 NOTE — ED Triage Notes (Signed)
Per wife pt fell around 0300 and presented with slurred speech and right sided weakness.

## 2022-08-06 NOTE — Progress Notes (Signed)
*  PRELIMINARY RESULTS* Echocardiogram 2D Echocardiogram has been performed.  Stacey Drain 08/06/2022, 4:32 PM

## 2022-08-06 NOTE — Evaluation (Signed)
SLP Cancellation Note  Patient Details Name: Dustin Blanchard MRN: [id number] DOB: 1951-12-18   Cancelled treatment:       Reason Eval/Treat Not Completed: Other (comment);Patient at procedure or test/unavailable (per chart, pt is in IR at this time; will continue efforts)  Rolena Infante, MS [facility] SLP Acute Rehab Services Office [phone] Pager [phone]   Chales Abrahams 08/06/2022, 7:26 AM

## 2022-08-06 NOTE — Progress Notes (Signed)
SLP Cancellation Note  Patient Details Name: Dustin Blanchard MRN: [id number] DOB: March 23, 1951   Cancelled treatment:       Reason Eval/Treat Not Completed: Fatigue/lethargy limiting ability to participate (Pt's case discussed with Marissa, RN who advised that the pt is currently too lethargic for the swallow evaluation. It was agreed that SLP will follow up on subsequent date unless contacted sooner.)  [name] I. Vear Clock, MS, CCC-SLP Acute Rehabilitation Services Office number [phone]  Scheryl Marten 08/06/2022, 3:07 PM

## 2022-08-06 NOTE — Sedation Documentation (Signed)
Pt transported to PACU via bed accompanied by RN and CRNA. Philip RN at bedside to receive pt. Handoff complete bilateral lower distal pulses present via doppler. Right groin remains level 0. No s/s of distress at this time.

## 2022-08-06 NOTE — Progress Notes (Signed)
Code stroke  Call time  430am   Ems in route Beeper   440am Start   450 End   5am Soc   5am Epic   5am Rad   505am

## 2022-08-06 NOTE — Progress Notes (Signed)
PT Cancellation Note  Patient Details Name: Dustin Blanchard MRN: [id number] DOB: [birth year]   Cancelled Treatment:    Reason Eval/Treat Not Completed: Active bedrest order; patient on bedrest post procedure, will attempt later as appropriate.   Elray Mcgregor 08/06/2022, 9:36 AM Sheran Lawless, PT Acute Rehabilitation Services Office:[phone] 08/06/2022

## 2022-08-06 NOTE — Anesthesia Preprocedure Evaluation (Signed)
Anesthesia Evaluation    Reviewed: Allergy & Precautions, Patient's Chart, lab work & pertinent test results  History of Anesthesia Complications Negative for: history of anesthetic complications  Airway Mallampati: Unable to assess  TM Distance: >3 FB     Dental   Pulmonary former smoker,    breath sounds clear to auscultation       Cardiovascular +CHF   Rhythm:Regular Rate:Normal   '19 TTE - EF 45%    Neuro/Psych PSYCHIATRIC DISORDERS Dementia CVA, Residual Symptoms    GI/Hepatic   Endo/Other   Primary hyperparathyroidism    Renal/GU      Musculoskeletal   Abdominal   Peds  Hematology   Anesthesia Other Findings   Reproductive/Obstetrics                             Anesthesia Physical Anesthesia Plan  ASA: 4 and emergent  Anesthesia Plan: General   Post-op Pain Management: Minimal or no pain anticipated   Induction: Intravenous, Cricoid pressure planned and Rapid sequence  PONV Risk Score and Plan: 2 and Treatment may vary due to age or medical condition and Ondansetron  Airway Management Planned: Oral ETT  Additional Equipment:   Intra-op Plan:   Post-operative Plan: Possible Post-op intubation/ventilation  Informed Consent:     History available from chart only  Plan Discussed with: CRNA, Anesthesiologist and Surgeon  Anesthesia Plan Comments:         Anesthesia Quick Evaluation

## 2022-08-06 NOTE — Progress Notes (Signed)
OT Cancellation Note  Patient Details Name: Dustin Blanchard MRN: [id number] DOB: 27-Oct-1951   Cancelled Treatment:    Reason Eval/Treat Not Completed: Active bedrest order (Will assess when activity orders updated.)  [facility] 08/06/2022, 9:38 AM Luisa Dago, OT/L   Acute OT Clinical Specialist Acute Rehabilitation Services Pager [phone] Office [phone]

## 2022-08-06 NOTE — Procedures (Signed)
INTERVENTIONAL NEURORADIOLOGY BRIEF POSTPROCEDURE NOTE  DIAGNOSTIC CEREBRAL ANGIOGRAM AND ENDOVASCULAR REVASCULARIZATION  Attending: Dr. Pedro Earls  Diagnosis: Left M3/MCA occlusion  Access site: RCFA  Access closure: Perclose prostyle  Anesthesia: GETA  Medication used: 2g Ancef IV.  Complications: Small contrast extravasation, left Sylvian fissure.  Estimated blood loss: Minimal.  Specimen: None.  Findings: Occlusion of a Left M3/MCA middle division branch. Access achieved with microwire to the occluded branch. Attempt to navigate microcatheter was met with significant resistance. Angiogram then showed contrast extravasation with recanalization of the occluded branch (TICI 2C). Extravasation confirmed on flat panel CT. Delay angiograms showed no further extravasation, stable for over 30 minutes. Follow-up flat panel CT showed no progression of extravasation.  The patient was transferred to ICU in stable condition.   PLAN: - Follow-up head CT in 3 hours - Bed rest x 6 hours - Strict blood pressure control 120-140 mmHg.

## 2022-08-07 ENCOUNTER — Inpatient Hospital Stay (HOSPITAL_COMMUNITY): Payer: Medicare HMO

## 2022-08-07 ENCOUNTER — Other Ambulatory Visit: Payer: Self-pay

## 2022-08-07 ENCOUNTER — Other Ambulatory Visit (HOSPITAL_COMMUNITY): Payer: Medicare HMO

## 2022-08-07 ENCOUNTER — Encounter (HOSPITAL_COMMUNITY): Payer: Self-pay | Admitting: Interventional Radiology

## 2022-08-07 DIAGNOSIS — I63512 Cerebral infarction due to unspecified occlusion or stenosis of left middle cerebral artery: Secondary | ICD-10-CM | POA: Diagnosis not present

## 2022-08-07 LAB — COMPREHENSIVE METABOLIC PANEL
ALT: 11 U/L (ref 0–44)
AST: 22 U/L (ref 15–41)
Albumin: 3.2 g/dL — ABNORMAL LOW (ref 3.5–5.0)
Alkaline Phosphatase: 58 U/L (ref 38–126)
Anion gap: 10 (ref 5–15)
BUN: 6 mg/dL — ABNORMAL LOW (ref 8–23)
CO2: 18 mmol/L — ABNORMAL LOW (ref 22–32)
Calcium: 8.6 mg/dL — ABNORMAL LOW (ref 8.9–10.3)
Chloride: 114 mmol/L — ABNORMAL HIGH (ref 98–111)
Creatinine, Ser: 0.94 mg/dL (ref 0.61–1.24)
GFR, Estimated: >60 mL/min (ref >60)
Glucose, Bld: 91 mg/dL (ref 70–99)
Potassium: 4.1 mmol/L (ref 3.5–5.1)
Sodium: 142 mmol/L (ref 135–145)
Total Bilirubin: 0.8 mg/dL (ref 0.3–1.2)
Total Protein: 5.7 g/dL — ABNORMAL LOW (ref 6.5–8.1)

## 2022-08-07 LAB — LIPID PANEL
Cholesterol: 130 mg/dL (ref 0–200)
HDL: 47 mg/dL (ref >40)
LDL Cholesterol: 71 mg/dL (ref 0–99)
Total CHOL/HDL Ratio: 2.8 RATIO
Triglycerides: 60 mg/dL (ref <150)
VLDL: 12 mg/dL (ref 0–40)

## 2022-08-07 LAB — GLUCOSE, CAPILLARY: Glucose-Capillary: 106 mg/dL — ABNORMAL HIGH (ref 70–99)

## 2022-08-07 LAB — PHOSPHORUS: Phosphorus: 2.7 mg/dL (ref 2.5–4.6)

## 2022-08-07 LAB — MAGNESIUM: Magnesium: 2.1 mg/dL (ref 1.7–2.4)

## 2022-08-07 MED ORDER — QUETIAPINE FUMARATE 25 MG PO TABS
25.0000 mg | ORAL_TABLET | Freq: Every day | ORAL | Status: DC
  Administered 2022-08-07 – 2022-08-10 (×3): 25 mg
  Filled 2022-08-07 (×3): qty 1

## 2022-08-07 MED ORDER — ASPIRIN 81 MG PO CHEW: 81.0000 mg | CHEWABLE_TABLET | Freq: Every day | ORAL | Status: DC

## 2022-08-07 MED ORDER — HALOPERIDOL LACTATE 5 MG/ML IJ SOLN
2.0000 mg | Freq: Once | INTRAMUSCULAR | Status: AC
  Administered 2022-08-07: 2 mg via INTRAVENOUS
  Filled 2022-08-07: qty 1

## 2022-08-07 MED ORDER — ENOXAPARIN SODIUM 40 MG/0.4ML IJ SOSY
40.0000 mg | PREFILLED_SYRINGE | INTRAMUSCULAR | Status: DC
  Administered 2022-08-07 – 2022-08-09 (×3): 40 mg via SUBCUTANEOUS
  Filled 2022-08-07 (×3): qty 0.4

## 2022-08-07 MED ORDER — OSMOLITE 1.5 CAL PO LIQD
1000.0000 mL | ORAL | Status: DC
  Administered 2022-08-07 – 2022-08-10 (×4): 1000 mL

## 2022-08-07 MED ORDER — ADULT MULTIVITAMIN W/MINERALS CH
1.0000 | ORAL_TABLET | Freq: Every day | ORAL | Status: DC
  Administered 2022-08-08 – 2022-08-12 (×5): 1
  Filled 2022-08-07 (×5): qty 1

## 2022-08-07 MED ORDER — LORAZEPAM 2 MG/ML IJ SOLN
1.0000 mg | Freq: Once | INTRAMUSCULAR | Status: AC
  Administered 2022-08-07: 1 mg via INTRAVENOUS
  Filled 2022-08-07: qty 1

## 2022-08-07 MED ORDER — QUETIAPINE FUMARATE 25 MG PO TABS
12.5000 mg | ORAL_TABLET | Freq: Three times a day (TID) | ORAL | Status: DC | PRN
  Administered 2022-08-09 – 2022-08-12 (×3): 12.5 mg
  Filled 2022-08-07 (×3): qty 1

## 2022-08-07 MED ORDER — ASPIRIN 81 MG PO CHEW
81.0000 mg | CHEWABLE_TABLET | Freq: Every day | ORAL | Status: DC
  Administered 2022-08-08 – 2022-08-09 (×2): 81 mg
  Filled 2022-08-07 (×2): qty 1

## 2022-08-07 MED ORDER — ASPIRIN 300 MG RE SUPP
300.0000 mg | Freq: Every day | RECTAL | Status: DC
  Administered 2022-08-07: 300 mg via RECTAL
  Filled 2022-08-07: qty 1

## 2022-08-07 MED ORDER — ORAL CARE MOUTH RINSE: 15.0000 mL | OROMUCOSAL | Status: DC | PRN

## 2022-08-07 MED ORDER — CHLORHEXIDINE GLUCONATE CLOTH 2 % EX PADS
6.0000 | MEDICATED_PAD | Freq: Every day | CUTANEOUS | Status: DC
  Administered 2022-08-08 – 2022-08-12 (×3): 6 via TOPICAL

## 2022-08-07 MED ORDER — ATROPINE SULFATE 1 MG/10ML IJ SOSY
PREFILLED_SYRINGE | INTRAMUSCULAR | Status: AC
  Filled 2022-08-07: qty 10

## 2022-08-07 MED ORDER — PROSOURCE TF20 ENFIT COMPATIBL EN LIQD
60.0000 mL | Freq: Two times a day (BID) | ENTERAL | Status: DC
  Administered 2022-08-07 – 2022-08-12 (×9): 60 mL
  Filled 2022-08-07 (×9): qty 60

## 2022-08-07 MED ORDER — DIVALPROEX SODIUM 125 MG PO CSDR
250.0000 mg | DELAYED_RELEASE_CAPSULE | Freq: Three times a day (TID) | ORAL | Status: DC
  Filled 2022-08-07 (×3): qty 2

## 2022-08-07 MED ORDER — DILTIAZEM HCL-DEXTROSE 125-5 MG/125ML-% IV SOLN (PREMIX)
5.0000 mg/h | INTRAVENOUS | Status: DC
  Administered 2022-08-07: 5 mg/h via INTRAVENOUS
  Filled 2022-08-07: qty 125

## 2022-08-07 MED ORDER — ATORVASTATIN CALCIUM 10 MG PO TABS
20.0000 mg | ORAL_TABLET | Freq: Every day | ORAL | Status: DC
Start: 2022-08-07 — End: 2022-08-09
  Administered 2022-08-07 – 2022-08-09 (×3): 20 mg
  Filled 2022-08-07 (×3): qty 2

## 2022-08-07 NOTE — Progress Notes (Addendum)
STROKE TEAM PROGRESS NOTE   INTERVAL HISTORY His wife and dtr is at the bedside. RN reports ventricular ectopy and SB on tele this am, then this afternoon he went into Afib RVR. Diltiazem gtt started and cardiology consulted. Did not pass MBS, cortrak feeding tube now placed. He cont to have Right side deficits and global aphasia. MRI showed some petechial hemorrhagic conversion.  Patient went to new onset A-fib with rapid ventricular rate this afternoon and was started on Cardizem drip.  He also became agitated requiring to be put in restraints and using as needed Haldol and starting Depakote Vitals:   08/07/22 0600 08/07/22 0700 08/07/22 0800 08/07/22 1200  BP: 123/77 130/85 124/76   Pulse: 61 (!) 58 (!) 53   Resp: 15 15 20    Temp:   98.4 F (36.9 C) 97.6 F (36.4 C)  TempSrc:   Axillary Oral  SpO2: 97% 98% 100%   Weight:      Height:       CBC:  Recent Labs  Lab 08/06/22 0437  WBC 4.2  NEUTROABS 2.7  HGB 13.7  HCT 40.5  MCV 96.0  PLT [id number]   Basic Metabolic Panel:  Recent Labs  Lab 08/06/22 0437 08/07/22 0241  NA 143 142  K 4.3 4.1  CL 109 114*  CO2 27 18*  GLUCOSE 114* 91  BUN 10 6*  CREATININE 1.00 0.94  CALCIUM 9.4 8.6*  MG  --  2.1   Lipid Panel:  Recent Labs  Lab 08/07/22 0241  CHOL 130  TRIG 60  HDL 47  CHOLHDL 2.8  VLDL 12  LDLCALC 71   HgbA1c:  Recent Labs  Lab 08/06/22 1037  HGBA1C 4.6*   Urine Drug Screen:  Recent Labs  Lab 08/06/22 0948  LABOPIA NONE DETECTED  COCAINSCRNUR NONE DETECTED  LABBENZ NONE DETECTED  AMPHETMU NONE DETECTED  THCU NONE DETECTED  LABBARB NONE DETECTED    Alcohol Level  Recent Labs  Lab 08/06/22 0437  ETH <10    IMAGING past 24 hours MR BRAIN WO CONTRAST  Result Date: 08/06/2022 CLINICAL DATA:  Stroke follow-up EXAM: MRI HEAD WITHOUT CONTRAST TECHNIQUE: Multiplanar, multiecho pulse sequences of the brain and surrounding structures were obtained without intravenous contrast. COMPARISON:  None  Available. FINDINGS: Brain: Multifocal acute/subacute ischemia in the left MCA territory, predominantly involving the left frontal operculum and posterior temporal lobe. There is a punctate focus of abnormal diffusion restriction within the paramedian right frontal lobe that is likely a susceptibility artifact related to falcine calcification. There is susceptibility effects in the left MCA territory from likely extravasated contrast. There is multifocal hyperintense T2-weighted signal within the white matter. Parenchymal volume and CSF spaces are normal. Small volume hemorrhage at the left external capsule (series 6, image 19), which appears to be old. Vascular: Major flow voids are preserved. Skull and upper cervical spine: Normal calvarium and skull base. Visualized upper cervical spine and soft tissues are normal. Sinuses/Orbits:No paranasal sinus fluid levels or advanced mucosal thickening. No mastoid or middle ear effusion. Normal orbits. IMPRESSION: 1. Multifocal acute/subacute ischemia in the left MCA territory, predominantly involving the left frontal operculum and posterior temporal lobe. 2. Small volume hemorrhage at the left external capsule, favored to be old. Electronically Signed   By: Ulyses Jarred M.D.   On: 08/06/2022 18:54   ECHOCARDIOGRAM COMPLETE  Result Date: 08/06/2022    ECHOCARDIOGRAM REPORT   Patient Name:   CALAIS MALSCH Date of Exam: 08/06/2022 Medical Rec #:  [id number]  Height:       72.0 in Accession #:    [id number]     Weight:       158.7 lb Date of Birth:  1951-06-05       BSA:          1.931 m Patient Age:    71 years       BP:           136/90 mmHg Patient Gender: M              HR:           81 bpm. Exam Location:  Inpatient Procedure: 2D Echo, Cardiac Doppler and Color Doppler Indications:    Stroke  History:        Patient has no prior history of Echocardiogram examinations.                 Acute ischemic left MCA stroke ([place]), Dementia.  Sonographer:    Alvino Chapel RCS  Referring Phys: [id number] [employer]  1. Coarse trabeculation of the apex without obvious thrombus. Left ventricular ejection fraction, by estimation, is 35 to 40%. Left ventricular ejection fraction by 2D MOD biplane is 37.1 %. The left ventricle has moderately decreased function. The left  ventricle demonstrates global hypokinesis. There is mild left ventricular hypertrophy. Left ventricular diastolic parameters are consistent with Grade III diastolic dysfunction (restrictive). Elevated left ventricular end-diastolic pressure.  2. Right ventricular systolic function is low normal. The right ventricular size is normal. There is mildly elevated pulmonary artery systolic pressure. The estimated right ventricular systolic pressure is 99991111 mmHg.  3. Left atrial size was severely dilated.  4. Moderate pericardial effusion. The pericardial effusion is circumferential. There is no evidence of cardiac tamponade.  5. The mitral valve is abnormal. Moderate mitral valve regurgitation.  6. The aortic valve is tricuspid. Aortic valve regurgitation is trivial.  7. The inferior vena cava is normal in size with greater than 50% respiratory variability, suggesting right atrial pressure of 3 mmHg. Comparison(s): No prior Echocardiogram. FINDINGS  Left Ventricle: Coarse trabeculation of the apex without obvious thrombus. Left ventricular ejection fraction, by estimation, is 35 to 40%. Left ventricular ejection fraction by 2D MOD biplane is 37.1 %. The left ventricle has moderately decreased function. The left ventricle demonstrates global hypokinesis. The left ventricular internal cavity size was normal in size. There is mild left ventricular hypertrophy. Left ventricular diastolic parameters are consistent with Grade III diastolic dysfunction (restrictive). Elevated left ventricular end-diastolic pressure. Right Ventricle: The right ventricular size is normal. No increase in right ventricular wall thickness. Right  ventricular systolic function is low normal. There is mildly elevated pulmonary artery systolic pressure. The tricuspid regurgitant velocity is 3.21 m/s, and with an assumed right atrial pressure of 3 mmHg, the estimated right ventricular systolic pressure is 99991111 mmHg. Left Atrium: Left atrial size was severely dilated. Right Atrium: Right atrial size was normal in size. Pericardium: A moderately sized pericardial effusion is present. The pericardial effusion is circumferential. There is diastolic collapse of the right atrial wall and diastolic collapse of the right ventricular free wall. There is no evidence of cardiac tamponade. Mitral Valve: The mitral valve is abnormal. There is mild thickening of the anterior and posterior mitral valve leaflet(s). Moderate mitral valve regurgitation. Tricuspid Valve: The tricuspid valve is grossly normal. Tricuspid valve regurgitation is mild. Aortic Valve: The aortic valve is tricuspid. Aortic valve regurgitation is trivial. Pulmonic Valve: The pulmonic valve was grossly  normal. Pulmonic valve regurgitation is trivial. Aorta: The aortic root and ascending aorta are structurally normal, with no evidence of dilitation. Venous: The inferior vena cava is normal in size with greater than 50% respiratory variability, suggesting right atrial pressure of 3 mmHg. IAS/Shunts: No atrial level shunt detected by color flow Doppler.  LEFT VENTRICLE PLAX 2D                        Biplane EF (MOD) LVIDd:         5.40 cm         LV Biplane EF:   Left LVIDs:         3.90 cm                          ventricular LV PW:         1.30 cm                          ejection LV IVS:        1.10 cm                          fraction by LVOT diam:     2.00 cm                          2D MOD LV SV:         47                               biplane is LV SV Index:   24                               37.1 %. LVOT Area:     3.14 cm                                Diastology                                LV e'  medial:    5.98 cm/s LV Volumes (MOD)               LV E/e' medial:  15.6 LV vol d, MOD    125.5 ml      LV e' lateral:   8.49 cm/s A2C:                           LV E/e' lateral: 11.0 LV vol d, MOD    140.8 ml A4C: LV vol s, MOD    76.4 ml A2C: LV vol s, MOD    88.4 ml A4C: LV SV MOD A2C:   49.1 ml LV SV MOD A4C:   140.8 ml LV SV MOD BP:    50.3 ml RIGHT VENTRICLE RV S prime:     10.30 cm/s TAPSE (M-mode): 1.6 cm LEFT ATRIUM              Index        RIGHT ATRIUM           Index LA diam:  4.30 cm  2.23 cm/m   RA Area:     19.80 cm LA Vol (A2C):   94.0 ml  48.67 ml/m  RA Volume:   51.10 ml  26.46 ml/m LA Vol (A4C):   102.0 ml 52.82 ml/m LA Biplane Vol: 107.0 ml 55.40 ml/m  AORTIC VALVE LVOT Vmax:   75.60 cm/s LVOT Vmean:  48.600 cm/s LVOT VTI:    0.150 m  AORTA Ao Root diam: 4.10 cm MITRAL VALVE               TRICUSPID VALVE MV Area (PHT): 5.66 cm    TR Peak grad:   41.2 mmHg MV Decel Time: 134 msec    TR Vmax:        321.00 cm/s MV E velocity: 93.30 cm/s MV A velocity: 30.20 cm/s  SHUNTS MV E/A ratio:  3.09        Systemic VTI:  0.15 m                            Systemic Diam: 2.00 cm Zoila Shutter MD Electronically signed by Zoila Shutter MD Signature Date/Time: 08/06/2022/4:42:49 PM    Final     PHYSICAL EXAM: General - critically ill, elderly black man in mild distress. Seen sitting up to the chair with rehab HEENT- normocephalic, hoh  Cardiovascular - Regular rhythm and rate. Skin- warm, dry, intact  NEURO: Mental Status - Awake, alert, but poor attention. Globally aphasic, cannot follow verbal commands. With much coaching and mimicking, he can do some simple commands, but it is difficult to keep his attention to complete task. CN: PERRL. Gaze is slightly disconjugate, left preference. Cannot get to follow commands to fully test VF or EOM. Does track at times. Right facial droop.  Motor/sensory: Right arm drifts down to chair, able to better hold right leg, but still with drift. Withdraws  to pain slower on the right, difficult to test fully d/t lack of communication. Gait and Station - deferred.  Baseline mRS: 3 for using a walker/cane, needing assistance with bills and some ADLs, but able to feed/dress self.   ASSESSMENT/PLAN Istvan Behar is a 71 y.o. male with a past medical history significant for dementia, hypertension, hyperlipidemia, sinus bradycardia, BPH. He presented on 8/17 during early am hours when his wife heard him on the floor in his urine. He was treated with IV TNK and thrombectomy with TICI2c restoration of flow.   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 Leftward gaze deviation, raising the possibility of cortical ischemia in this setting. No Left MCA territory cortical infarct changes are identified. But there is a small age indeterminate Left PCA infarct at the left occipital pole. 2. No acute intracranial hemorrhage identified. ASPECTS 10. 3. Advanced bilateral white matter changes most commonly due to small vessel disease CTA head & neck with CT perfusion  1. CT Perfusion is positive for left MCA territory oligemia, proximally 120 mL. No infarct core detected by strict criteria, but abnormal CBV at the left operculum. 2. No large vessel occlusion identified on CTA, but left M3 operculum branch occlusion(s) suspected. Still, given the CTP findings and presentation recommend discussion with Neurology for consideration of NIR intervention. 3. 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 Bilateral ICA siphon plaque with up to moderate Left supraclinoid stenosis. Moderate Left  Vertebral V4 stenosis. No proximal Left PCA branch occlusion (small age indeterminate left occipital pole infarct on plain CT). Cerebral angio/Post IR CT  1. 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 2. Capillary  hyperemia along patent anterior left parietal M3 branch with early venous drainage suggestive of ongoing ischemia. 3. Access to occluded branch with microwire resulted in recanalization (TICI 2C) with small contrast extravasation which resolved spontaneously. Delay flat panel CT and cerebral angiogram showed no further contrast extravasation. Repeat CT H  1. Density overlying the left cerebral convexity is grossly similar to the intra procedural images obtained earlier the same day and is favored to reflect predominantly contrast extravasation; however, superimposed subarachnoid hemorrhage can not be entirely excluded. 2. Small focus of hypodensity in the left frontal lobe cortex suspicious for small evolving infarct MRI brain: multifocal LMCA with small petechial hemorrhagic conversion noted. 2D Echo :35-40% EF, Severe LA dilation, Gr III diastolic dysfunction. Mild LVH,  LV global hypokinesis, No PFO/IAS LDL 71 HgbA1c 4.6 VTE prophylaxis - SCD's       Diet    Diet NPO time specified        No antithrombotic prior to admission, now on No antithrombotic.  Therapy recommendations:  pending Disposition:  pending   Hypertension Home meds:  none  UnStable On Cleviprex gtt  SBP goal 120-140 for 24 hrs s/p thrombectomy Long-term BP goal normotensive   Hyperlipidemia Home meds: atorvastatin LDL 71goal < 70 Continue statin at discharge     Other Stroke Risk Factors Advanced Age >/= 50    Other Active Problems Dementia  Sundowning- started Seroquel 25mg  with dinner and 12.5mg  PRN agitation Agitated delirium- now requiring restraints as he is puling at tubes/lines Dysphagia - failed MBS, Cortac placed for meds/TF NEW Afib RVR- 12 lead EKG, started dilt gtt. Given other issues, inc SB 30's, then tachy with ventricular ectopy on tele today, as well as Echo findings, Cardiology has been consulted.   Hospital day # 1  Desiree Metzger-Cihelka, ARNP-C, ANVP-BC Pager: [phone]     I have personally obtained history,examined this patient, reviewed notes, independently viewed imaging studies, participated in medical decision making and plan of care.ROS completed by me personally and pertinent positives fully documented  I have made any additions or clarifications directly to the above note. Agree with note above.  Patient has developed new onset atrial fibrillation with rapid ventricular rate and has been started on IV Cardizem drip but remains agitated will require restraints and medication agitation will start Depakote and use as needed Haldol.  We will hold off on IV heparin at the moment because of mild hemorrhagic transformation into his moderate-sized infarct.  Discussed with wife at the bedside and answered questions.  Consult cardiology for new onset A-fib. This patient is critically ill and at significant risk of neurological worsening, death and care requires constant monitoring of vital signs, hemodynamics,respiratory and cardiac monitoring, extensive review of multiple databases, frequent neurological assessment, discussion with family, other specialists and medical decision making of high complexity.I have made any additions or clarifications directly to the above note.This critical care time does not reflect procedure time, or teaching time or supervisory time of PA/NP/Med Resident etc but could involve care discussion time.  I spent 30 minutes of neurocritical care time  in the care of  this patient.     [phone], MD Medical Director [facility] Pager: [phone] 08/07/2022 4:26 PM  To contact Stroke Continuity provider, please refer to 08/09/2022.  After hours, contact General Neurology

## 2022-08-07 NOTE — Progress Notes (Addendum)
   08/07/22 1815  Provider Notification  Provider Name/Title Sal  Date Provider Notified 08/07/22  Time Provider Notified 1825  Method of Notification Page  Notification Reason Other (Comment) (HR dropping into 30s-40s, sustaining 50s, in 1st degree block)  Provider response Other (Comment) (MD to reach out to cards)  Date of Provider Response 08/07/22  Time of Provider Response 1834    Noted p waves dropping when patient HR drops.  Atropine at bedside, give for sustained HR in 30s, verbalized with Tollie Eth MD.

## 2022-08-07 NOTE — Progress Notes (Signed)
Initial Nutrition Assessment  DOCUMENTATION CODES:   Not applicable  INTERVENTION:   Initiate tube feeding via Cortrak tube: Osmolite 1.5 at 20 ml/h and increase by 10 ml every 8 hours to goal rate of 50 ml/h  (1200 ml per day) Prosource TF20 60 ml BID  Provides 1960 kcal, 115 gm protein, 912 ml free water daily  MVI with minerals daily per tube  Monitor magnesium and phosphorus every 12 hours x 4 occurrences, MD to replete as needed.    NUTRITION DIAGNOSIS:   Inadequate oral intake related to inability to eat as evidenced by NPO status.  GOAL:   Patient will meet greater than or equal to 90% of their needs  MONITOR:   TF tolerance  REASON FOR ASSESSMENT:   Consult Enteral/tube feeding initiation and management  ASSESSMENT:   Pt with PMH of dementia, HTN, HLD, admitted with acute L MCA stroke s/p thrombectomy.   8/18 cortrak placed; tip gastric; pt with new Afib with RVR  Medications reviewed and include: protonix NS @ 50 ml/hr  Labs reviewed    NUTRITION - FOCUSED PHYSICAL EXAM:  Deferred due to pt in Afib, agitated per RN  Diet Order:   Diet Order             Diet NPO time specified  Diet effective now                   EDUCATION NEEDS:   Not appropriate for education at this time  Skin:  Skin Assessment: Reviewed RN Assessment  Last BM:  unknown  Height:   Ht Readings from Last 1 Encounters:  08/06/22 6' (1.829 m)    Weight:   Wt Readings from Last 1 Encounters:  08/06/22 72 kg    BMI:  Body mass index is 21.53 kg/m.  Estimated Nutritional Needs:   Kcal:  1900-2100  Protein:  105-115 grams  Fluid:  >1.9 L/day  Cammy Copa., RD, LDN, CNSC See AMiON for contact information

## 2022-08-07 NOTE — Evaluation (Signed)
Speech Language Pathology Evaluation Patient Details Name: Dustin Blanchard MRN: [id number] DOB: 04/01/1951 Today's Date: 08/07/2022 Time: 0737-1062 SLP Time Calculation (min) (ACUTE ONLY): 13 min  Problem List:  Patient Active Problem List   Diagnosis Date Noted   Acute ischemic left MCA stroke (HCC) 08/06/2022   Past Medical History:  Past Medical History:  Diagnosis Date   Dementia [facility])    Past Surgical History:  Past Surgical History:  Procedure Laterality Date   HERNIA REPAIR     HIP SURGERY     IR CT HEAD LTD  08/06/2022   IR CT HEAD LTD  08/06/2022   IR PERCUTANEOUS ART THROMBECTOMY/INFUSION INTRACRANIAL INC DIAG ANGIO  08/06/2022   IR US GUIDE VASC ACCESS RIGHT  08/06/2022   HPI:  Pt is a 71 y.o. male who presented with right-sided weakness and aphasia. CT head: Density overlying the left cerebral convexity is grossly similar  to the intra procedural images obtained earlier the same day and is  favored to reflect predominantly contrast extravasation; however,  superimposed subarachnoid hemorrhage can not be entirely excluded. Small focus of hypodensity in the left frontal lobe cortex suspicious for small evolving infarct. PMH: hypertension, hyperlipidemia, sinus bradycardia, BPH, vascular dementia (dx in Dec, 2022 per family)   Assessment / Plan / Recommendation Clinical Impression  Pt was seen for speech-language evaluation with his wife and daughter present. Pt's family reported that the pt was diagnosed with vascular dementia in December, 2022 and that he has "good and bad days" with confusion. Per the family, pt demonstrates semantic paraphasias on days when he is more confused, but his communication is grossly intact at baseline. Pt attempted to exit the chair and stand twice during the evaluation; pt's family reported that pt typically demonstrates this behavior when he is tired answering questions/becomes frustrated. He was able to be redirected with cues. Pt presented with  severe non-fluent aphasia characterized by significant impairments in both expressive and receptive language. He followed a single 1-step command and exhibited some accuracy with simple yes/no questions, but notable difficulty was observed beyond this level. Pt did not communicate verbally, but vocalized in response to questions and intermittently demonstrated intonation which indicated affirmative responses. Skilled SLP services are clinically indicated at this time.    SLP Assessment  SLP Recommendation/Assessment: Patient needs continued Speech Lanaguage Pathology Services SLP Visit Diagnosis: Aphasia (R47.01)    Recommendations for follow up therapy are one component of a multi-disciplinary discharge planning process, led by the attending physician.  Recommendations may be updated based on patient status, additional functional criteria and insurance authorization.    Follow Up Recommendations  Acute inpatient rehab (3hours/day)    Assistance Recommended at Discharge  Frequent or constant Supervision/Assistance  Functional Status Assessment Patient has had a recent decline in their functional status and demonstrates the ability to make significant improvements in function in a reasonable and predictable amount of time.  Frequency and Duration min 2x/week  2 weeks      SLP Evaluation Cognition  Overall Cognitive Status: Difficult to assess Arousal/Alertness:  (due to aphasia) Attention: Focused Focused Attention: Impaired Focused Attention Impairment: Verbal complex       Comprehension  Auditory Comprehension Overall Auditory Comprehension: Impaired Yes/No Questions: Impaired Basic Biographical Questions:  (2/5) Complex Questions:  (0/4) Commands: Impaired One Step Basic Commands:  (1/5) Two Step Basic Commands:  (0/2)    Expression Expression Primary Mode of Expression: Verbal Verbal Expression Overall Verbal Expression: Impaired Initiation: Impaired Automatic Speech:  Counting (0/10)  Level of Generative/Spontaneous Verbalization:  (only vocalization noted) Repetition: Impaired Written Expression Dominant Hand: Right   Oral / Motor  Oral Motor/Sensory Function Overall Oral Motor/Sensory Function: Mild impairment Facial ROM: Reduced right;Suspected CN VII (facial) dysfunction Facial Symmetry: Abnormal symmetry right;Suspected CN VII (facial) dysfunction Facial Strength: Reduced right;Suspected CN VII (facial) dysfunction Lingual Strength: Reduced;Suspected CN XII (hypoglossal) dysfunction           Emberlie Gotcher I. Vear Clock, MS, CCC-SLP Acute Rehabilitation Services Office number [phone]  Scheryl Marten 08/07/2022, 10:26 AM

## 2022-08-07 NOTE — Progress Notes (Signed)
Referring Physician(s): Srishti Bhagat  Supervising Physician: Pedro Earls  Patient Status:  [facility] - In-pt  Chief Complaint:  Code stroke  Brief History:  Dustin Blanchard is a 71 y.o. male with medical issues including hypertension, hyperlipidemia, sinus bradycardia, and BPH.   He was his normal state of health on 08/05/22 when he went to bed at 2330.    At approximately 2:58 AM his wife heard him grunting and found him on the floor in his urine.   At baseline he was independent in all of his ADLs.   CTA showed=  1. CT Perfusion is positive for left MCA territory oligemia, proximally 120 mL. No infarct core detected by strict criteria, but abnormal CBV at the left operculum. 2. No large vessel occlusion identified on CTA, but left M3 operculum branch occlusion(s) suspected.   Code stroke called = TNK not given due to out of window.  He underwent cerebral intervention by Dr. Katherina Right Rodrigues= DIAGNOSTIC CEREBRAL ANGIOGRAM AND ENDOVASCULAR REVASCULARIZATION   Attending: Dr. Pedro Earls   Diagnosis: Left M3/MCA occlusion   Access site: RCFA   Access closure: Perclose prostyle   Anesthesia: GETA   Medication used: 2g Ancef IV.   Complications: Small contrast extravasation, left Sylvian fissure.   Estimated blood loss: Minimal.   Specimen: None.   Findings: Occlusion of a Left M3/MCA middle division branch. Access achieved with microwire to the occluded branch. Attempt to navigate microcatheter was met with significant resistance. Angiogram then showed contrast extravasation with recanalization of the occluded branch (TICI 2C). Extravasation confirmed on flat panel CT. Delay angiograms showed no further extravasation, stable for over 30 minutes. Follow-up flat panel CT showed no progression of extravasation.   Subjective:  He is awake, in mitten restraints. Follows only some commands.  Allergies: Patient has no known  allergies.  Medications: Prior to Admission medications   Medication Sig Start Date End Date Taking? Authorizing Provider  acetaminophen (TYLENOL) 500 MG tablet Take 500 mg by mouth as needed (pain).   Yes [provider]  atorvastatin (LIPITOR) 20 MG tablet Take 20 mg by mouth daily. 05/24/22  Yes [provider]  Cholecalciferol (VITAMIN D3) 125 MCG (5000 UT) CAPS Take 5,000 Units by mouth daily.   Yes [provider]  ibuprofen (ADVIL) 200 MG tablet Take 200 mg by mouth as needed (pain).   Yes [provider]  polyethylene glycol (MIRALAX / GLYCOLAX) 17 g packet Take 17 g by mouth daily as needed for mild constipation.   Yes [provider]  tamsulosin (FLOMAX) 0.4 MG CAPS capsule Take 0.4 mg by mouth daily. 07/27/22  Yes [provider]     Vital Signs: BP (!) 146/116   Pulse (!) 135   Temp 97.6 F (36.4 C) (Oral)   Resp (!) 21   Ht 6' (1.829 m)   Wt 158 lb 11.7 oz (72 kg)   SpO2 100%   BMI 21.53 kg/m   Physical Exam Alert, awake,  only follows a few commands. Only moans. EOMs intact bilaterally  Would not smile but there does appear to be some right facial asymmetry. Would not stick out tongue Unable to assess fine motor and coordination as patient is in mitten restraints. 5/5 Strength bilaterally Common femoral artery puncture site looks good, no bleeding, no hematoma, no pseudoaneurysm  Currently tachycardic.  Imaging: DG Swallowing Func-Speech Pathology  Result Date: 08/07/2022 Table formatting from the original result was not included. Objective Swallowing  Evaluation: Type of Study: MBS-Modified Barium Swallow Study  Patient Details Name: Dustin Blanchard MRN: [id number] Date of Birth: [birth year] Today's Date: 08/07/2022 Time: SLP Start Time (ACUTE ONLY): 1312 -SLP Stop Time (ACUTE ONLY): 1334 SLP Time Calculation (min) (ACUTE ONLY): 22 min Past Medical History: Past Medical History: Diagnosis Date  Dementia [facility])  Past  Surgical History: Past Surgical History: Procedure Laterality Date  HERNIA REPAIR    HIP SURGERY    IR CT HEAD LTD  08/06/2022  IR CT HEAD LTD  08/06/2022  IR PERCUTANEOUS ART THROMBECTOMY/INFUSION INTRACRANIAL INC DIAG ANGIO  08/06/2022  IR US GUIDE VASC ACCESS RIGHT  08/06/2022  RADIOLOGY WITH ANESTHESIA N/A 08/06/2022  Procedure: IR WITH ANESTHESIA;  Surgeon: Luanne Bras, MD;  Location: [place];  Service: Radiology;  Laterality: N/A; HPI: Pt is a 71 y.o. male who presented with right-sided weakness and aphasia. CT head: Density overlying the left cerebral convexity is grossly similar  to the intra procedural images obtained earlier the same day and is  favored to reflect predominantly contrast extravasation; however,  superimposed subarachnoid hemorrhage can not be entirely excluded. Small focus of hypodensity in the left frontal lobe cortex suspicious for small evolving infarct. PMH: hypertension, hyperlipidemia, sinus bradycardia, BPH, vascular dementia (dx in Dec, 2022 per family)  No data recorded  Recommendations for follow up therapy are one component of a multi-disciplinary discharge planning process, led by the attending physician.  Recommendations may be updated based on patient status, additional functional criteria and insurance authorization. Assessment / Plan / Recommendation   08/07/2022   1:12 PM Clinical Impressions Clinical Impression Pt presents with oropharyngeal dysphagia characterized by weak lingual manipulation, a pharyngeal delay, and reduction in bolus cohesion, tongue base retraction, anterior laryngeal movement, epiglottic inversion, and pharyngeal constriction. He exhibited premature spillage to the pyriform sinuses, vallecular residue, pyriform sinus residue, and reduced laryngeal closure. He demonstrated penetration (PAS 3, 5) and/or silent aspiration (PAS 8) across consistencies. Laryngeal invasion was secondary to the pharyngeal delay, reduced laryngeal closure, and residue in the  pyriform sinuses. Episodes of aspiration intermittently triggered subtle throat clearing, but no instances resulted in coughing, and throat clearing was ineffective in expelling material. More viscous boluses (i.e., thickened liquids/solids) reduced the immediacy of aspiration, but aspiration was still noted from residue. Multiple secondary swallows reduced pharyngeal residue, but not to a functional level. It is recommended that the pt's NPO status be maintained and that short-term non-oral alimentation (e.g., cortrak) be initiated. Pt may have limited (i.e., ~4-5/hour) individual ice chips after thorough oral care and SLP will follow for dysphagia treatment. SLP Visit Diagnosis Dysphagia, oropharyngeal phase (R13.12) Impact on safety and function Moderate aspiration risk;Severe aspiration risk     08/07/2022   1:12 PM Treatment Recommendations Treatment Recommendations Therapy as outlined in treatment plan below     08/07/2022   1:12 PM Prognosis Prognosis for Safe Diet Advancement Good Barriers to Reach Goals Cognitive deficits;Language deficits;Severity of deficits   08/07/2022   1:12 PM Diet Recommendations SLP Diet Recommendations NPO;Ice chips PRN after oral care Medication Administration Via alternative means     08/07/2022   1:12 PM Other Recommendations Oral Care Recommendations Oral care QID;Staff/trained caregiver to provide oral care Follow Up Recommendations Acute inpatient rehab (3hours/day) Assistance recommended at discharge Frequent or constant Supervision/Assistance Functional Status Assessment Patient has had a recent decline in their functional status and demonstrates the ability to make significant improvements in function in a reasonable and predictable amount of time.   08/07/2022  1:12 PM Frequency and Duration  Speech Therapy Frequency (ACUTE ONLY) min 2x/week Treatment Duration 2 weeks     08/07/2022   1:12 PM Oral Phase Oral Phase Impaired Oral - Honey Teaspoon Lingual/palatal residue;Weak  lingual manipulation;Delayed oral transit;Decreased bolus cohesion;Premature spillage Oral - Honey Cup Lingual/palatal residue;Weak lingual manipulation;Delayed oral transit;Decreased bolus cohesion;Premature spillage Oral - Nectar Cup Lingual/palatal residue;Weak lingual manipulation;Delayed oral transit;Decreased bolus cohesion;Premature spillage Oral - Nectar Straw Lingual/palatal residue;Weak lingual manipulation;Delayed oral transit;Premature spillage;Decreased bolus cohesion Oral - Thin Cup Lingual/palatal residue;Weak lingual manipulation;Right anterior bolus loss;Delayed oral transit;Decreased bolus cohesion;Premature spillage Oral - Thin Straw Lingual/palatal residue;Weak lingual manipulation;Right anterior bolus loss;Premature spillage;Decreased bolus cohesion;Delayed oral transit Oral - Puree Lingual/palatal residue;Weak lingual manipulation;Decreased bolus cohesion;Premature spillage Oral - Mech Soft Lingual/palatal residue;Weak lingual manipulation;Premature spillage;Decreased bolus cohesion;Delayed oral transit;Impaired mastication    08/07/2022   1:12 PM Pharyngeal Phase Pharyngeal Phase Impaired Pharyngeal- Honey Teaspoon Reduced pharyngeal peristalsis;Reduced epiglottic inversion;Delayed swallow initiation-pyriform sinuses;Reduced anterior laryngeal mobility;Reduced airway/laryngeal closure;Reduced tongue base retraction;Penetration/Apiration after swallow;Trace aspiration;Pharyngeal residue - valleculae;Pharyngeal residue - pyriform;Pharyngeal residue - posterior pharnyx Pharyngeal Material enters airway, passes BELOW cords without attempt by patient to eject out (silent aspiration);Material enters airway, CONTACTS cords and not ejected out Pharyngeal- Honey Cup Reduced pharyngeal peristalsis;Reduced epiglottic inversion;Delayed swallow initiation-pyriform sinuses;Reduced anterior laryngeal mobility;Reduced airway/laryngeal closure;Reduced tongue base retraction;Penetration/Apiration after  swallow;Trace aspiration;Pharyngeal residue - valleculae;Pharyngeal residue - pyriform;Pharyngeal residue - posterior pharnyx;Penetration/Aspiration during swallow Pharyngeal Material enters airway, remains ABOVE vocal cords and not ejected out;Material enters airway, CONTACTS cords and not ejected out;Material enters airway, passes BELOW cords without attempt by patient to eject out (silent aspiration) Pharyngeal- Nectar Cup Reduced pharyngeal peristalsis;Reduced epiglottic inversion;Delayed swallow initiation-pyriform sinuses;Reduced anterior laryngeal mobility;Reduced airway/laryngeal closure;Reduced tongue base retraction;Pharyngeal residue - valleculae;Pharyngeal residue - pyriform;Pharyngeal residue - posterior pharnyx;Penetration/Aspiration during swallow;Penetration/Apiration after swallow Pharyngeal Material enters airway, CONTACTS cords and not ejected out;Material enters airway, passes BELOW cords without attempt by patient to eject out (silent aspiration) Pharyngeal- Nectar Straw Reduced pharyngeal peristalsis;Reduced epiglottic inversion;Delayed swallow initiation-pyriform sinuses;Reduced anterior laryngeal mobility;Reduced airway/laryngeal closure;Reduced tongue base retraction;Pharyngeal residue - valleculae;Pharyngeal residue - pyriform;Pharyngeal residue - posterior pharnyx;Penetration/Aspiration during swallow;Penetration/Apiration after swallow Pharyngeal Material enters airway, passes BELOW cords without attempt by patient to eject out (silent aspiration) Pharyngeal- Thin Cup Reduced pharyngeal peristalsis;Reduced epiglottic inversion;Delayed swallow initiation-pyriform sinuses;Reduced anterior laryngeal mobility;Reduced airway/laryngeal closure;Reduced tongue base retraction;Pharyngeal residue - valleculae;Pharyngeal residue - pyriform;Pharyngeal residue - posterior pharnyx;Penetration/Aspiration during swallow;Penetration/Apiration after swallow;Penetration/Aspiration before swallow Pharyngeal  Material enters airway, passes BELOW cords without attempt by patient to eject out (silent aspiration);Material enters airway, CONTACTS cords and not ejected out;Material enters airway, remains ABOVE vocal cords and not ejected out Pharyngeal- Thin Straw Reduced pharyngeal peristalsis;Reduced epiglottic inversion;Delayed swallow initiation-pyriform sinuses;Reduced anterior laryngeal mobility;Reduced airway/laryngeal closure;Reduced tongue base retraction;Pharyngeal residue - valleculae;Pharyngeal residue - pyriform;Pharyngeal residue - posterior pharnyx;Penetration/Aspiration during swallow;Penetration/Apiration after swallow Pharyngeal Material enters airway, passes BELOW cords without attempt by patient to eject out (silent aspiration);Material enters airway, CONTACTS cords and not ejected out Pharyngeal- Puree Reduced pharyngeal peristalsis;Reduced epiglottic inversion;Delayed swallow initiation-pyriform sinuses;Reduced anterior laryngeal mobility;Reduced airway/laryngeal closure;Reduced tongue base retraction;Pharyngeal residue - valleculae;Pharyngeal residue - pyriform;Pharyngeal residue - posterior pharnyx;Penetration/Aspiration during swallow Pharyngeal Material enters airway, CONTACTS cords and then ejected out Pharyngeal- Mechanical Soft Reduced pharyngeal peristalsis;Reduced epiglottic inversion;Delayed swallow initiation-pyriform sinuses;Reduced anterior laryngeal mobility;Reduced airway/laryngeal closure;Reduced tongue base retraction;Pharyngeal residue - valleculae;Pharyngeal residue - pyriform;Pharyngeal residue - posterior pharnyx;Penetration/Aspiration during swallow;Penetration/Apiration after swallow Pharyngeal Material enters airway, remains ABOVE vocal cords and not ejected out    08/07/2022  1:12 PM Cervical Esophageal Phase  Cervical Esophageal Phase [facility] Shanika I. Hardin Negus, [place], [place] Office number [phone] Horton Marshall 08/07/2022, 3:46 PM                      DG Abd Portable 1V  Result Date: 08/07/2022 CLINICAL DATA:  NG tube placement. EXAM: PORTABLE ABDOMEN - 1 VIEW COMPARISON:  None Available. FINDINGS: Enteric tube is present as it courses through the stomach with tip over the right upper quadrant likely over the distal stomach or proximal duodenum. Visualized bowel gas pattern is nonobstructive. Contrast present within the large and small bowel. There is no free peritoneal air. Lung bases are clear. Mild cardiomegaly. Mild degenerative change of the spine. IMPRESSION: Enteric tube with tip over the right upper quadrant likely over the distal stomach or proximal duodenum. Electronically Signed   By: Marin Olp M.D.   On: 08/07/2022 15:17   MR BRAIN WO CONTRAST  Result Date: 08/06/2022 CLINICAL DATA:  Stroke follow-up EXAM: MRI HEAD WITHOUT CONTRAST TECHNIQUE: Multiplanar, multiecho pulse sequences of the brain and surrounding structures were obtained without intravenous contrast. COMPARISON:  None Available. FINDINGS: Brain: Multifocal acute/subacute ischemia in the left MCA territory, predominantly involving the left frontal operculum and posterior temporal lobe. There is a punctate focus of abnormal diffusion restriction within the paramedian right frontal lobe that is likely a susceptibility artifact related to falcine calcification. There is susceptibility effects in the left MCA territory from likely extravasated contrast. There is multifocal hyperintense T2-weighted signal within the white matter. Parenchymal volume and CSF spaces are normal. Small volume hemorrhage at the left external capsule (series 6, image 19), which appears to be old. Vascular: Major flow voids are preserved. Skull and upper cervical spine: Normal calvarium and skull base. Visualized upper cervical spine and soft tissues are normal. Sinuses/Orbits:No paranasal sinus fluid levels or advanced mucosal thickening. No mastoid or middle ear effusion. Normal orbits. IMPRESSION:  1. Multifocal acute/subacute ischemia in the left MCA territory, predominantly involving the left frontal operculum and posterior temporal lobe. 2. Small volume hemorrhage at the left external capsule, favored to be old. Electronically Signed   By: Ulyses Jarred M.D.   On: 08/06/2022 18:54   ECHOCARDIOGRAM COMPLETE  Result Date: 08/06/2022    ECHOCARDIOGRAM REPORT   Patient Name:   Dustin Blanchard Date of Exam: 08/06/2022 Medical Rec #:  [id number]      Height:       72.0 in Accession #:    [id number]     Weight:       158.7 lb Date of Birth:  1951/07/19       BSA:          1.931 m Patient Age:    7 years       BP:           136/90 mmHg Patient Gender: M              HR:           81 bpm. Exam Location:  Inpatient Procedure: 2D Echo, Cardiac Doppler and Color Doppler Indications:    Stroke  History:        Patient has no prior history of Echocardiogram examinations.                 Acute ischemic left MCA stroke ([place]), Dementia.  Sonographer:    Alvino Chapel RCS Referring Phys: [id number] [place]  1. Coarse  trabeculation of the apex without obvious thrombus. Left ventricular ejection fraction, by estimation, is 35 to 40%. Left ventricular ejection fraction by 2D MOD biplane is 37.1 %. The left ventricle has moderately decreased function. The left  ventricle demonstrates global hypokinesis. There is mild left ventricular hypertrophy. Left ventricular diastolic parameters are consistent with Grade III diastolic dysfunction (restrictive). Elevated left ventricular end-diastolic pressure.  2. Right ventricular systolic function is low normal. The right ventricular size is normal. There is mildly elevated pulmonary artery systolic pressure. The estimated right ventricular systolic pressure is 28.7 mmHg.  3. Left atrial size was severely dilated.  4. Moderate pericardial effusion. The pericardial effusion is circumferential. There is no evidence of cardiac tamponade.  5. The mitral valve is abnormal.  Moderate mitral valve regurgitation.  6. The aortic valve is tricuspid. Aortic valve regurgitation is trivial.  7. The inferior vena cava is normal in size with greater than 50% respiratory variability, suggesting right atrial pressure of 3 mmHg. Comparison(s): No prior Echocardiogram. FINDINGS  Left Ventricle: Coarse trabeculation of the apex without obvious thrombus. Left ventricular ejection fraction, by estimation, is 35 to 40%. Left ventricular ejection fraction by 2D MOD biplane is 37.1 %. The left ventricle has moderately decreased function. The left ventricle demonstrates global hypokinesis. The left ventricular internal cavity size was normal in size. There is mild left ventricular hypertrophy. Left ventricular diastolic parameters are consistent with Grade III diastolic dysfunction (restrictive). Elevated left ventricular end-diastolic pressure. Right Ventricle: The right ventricular size is normal. No increase in right ventricular wall thickness. Right ventricular systolic function is low normal. There is mildly elevated pulmonary artery systolic pressure. The tricuspid regurgitant velocity is 3.21 m/s, and with an assumed right atrial pressure of 3 mmHg, the estimated right ventricular systolic pressure is 86.7 mmHg. Left Atrium: Left atrial size was severely dilated. Right Atrium: Right atrial size was normal in size. Pericardium: A moderately sized pericardial effusion is present. The pericardial effusion is circumferential. There is diastolic collapse of the right atrial wall and diastolic collapse of the right ventricular free wall. There is no evidence of cardiac tamponade. Mitral Valve: The mitral valve is abnormal. There is mild thickening of the anterior and posterior mitral valve leaflet(s). Moderate mitral valve regurgitation. Tricuspid Valve: The tricuspid valve is grossly normal. Tricuspid valve regurgitation is mild. Aortic Valve: The aortic valve is tricuspid. Aortic valve regurgitation is  trivial. Pulmonic Valve: The pulmonic valve was grossly normal. Pulmonic valve regurgitation is trivial. Aorta: The aortic root and ascending aorta are structurally normal, with no evidence of dilitation. Venous: The inferior vena cava is normal in size with greater than 50% respiratory variability, suggesting right atrial pressure of 3 mmHg. IAS/Shunts: No atrial level shunt detected by color flow Doppler.  LEFT VENTRICLE PLAX 2D                        Biplane EF (MOD) LVIDd:         5.40 cm         LV Biplane EF:   Left LVIDs:         3.90 cm                          ventricular LV PW:         1.30 cm  ejection LV IVS:        1.10 cm                          fraction by LVOT diam:     2.00 cm                          2D MOD LV SV:         47                               biplane is LV SV Index:   24                               37.1 %. LVOT Area:     3.14 cm                                Diastology                                LV e' medial:    5.98 cm/s LV Volumes (MOD)               LV E/e' medial:  15.6 LV vol d, MOD    125.5 ml      LV e' lateral:   8.49 cm/s A2C:                           LV E/e' lateral: 11.0 LV vol d, MOD    140.8 ml A4C: LV vol s, MOD    76.4 ml A2C: LV vol s, MOD    88.4 ml A4C: LV SV MOD A2C:   49.1 ml LV SV MOD A4C:   140.8 ml LV SV MOD BP:    50.3 ml RIGHT VENTRICLE RV S prime:     10.30 cm/s TAPSE (M-mode): 1.6 cm LEFT ATRIUM              Index        RIGHT ATRIUM           Index LA diam:        4.30 cm  2.23 cm/m   RA Area:     19.80 cm LA Vol (A2C):   94.0 ml  48.67 ml/m  RA Volume:   51.10 ml  26.46 ml/m LA Vol (A4C):   102.0 ml 52.82 ml/m LA Biplane Vol: 107.0 ml 55.40 ml/m  AORTIC VALVE LVOT Vmax:   75.60 cm/s LVOT Vmean:  48.600 cm/s LVOT VTI:    0.150 m  AORTA Ao Root diam: 4.10 cm MITRAL VALVE               TRICUSPID VALVE MV Area (PHT): 5.66 cm    TR Peak grad:   41.2 mmHg MV Decel Time: 134 msec    TR Vmax:        321.00 cm/s MV E velocity:  93.30 cm/s MV A velocity: 30.20 cm/s  SHUNTS MV E/A ratio:  3.09        Systemic VTI:  0.15 m  Systemic Diam: 2.00 cm Lyman Bishop MD Electronically signed by Lyman Bishop MD Signature Date/Time: 08/06/2022/4:42:49 PM    Final    CT HEAD WO CONTRAST (5MM)  Result Date: 08/06/2022 CLINICAL DATA:  Stroke follow-up. Contrast extravasation during procedure. EXAM: CT HEAD WITHOUT CONTRAST TECHNIQUE: Contiguous axial images were obtained from the base of the skull through the vertex without intravenous contrast. RADIATION DOSE REDUCTION: This exam was performed according to the departmental dose-optimization program which includes automated exposure control, adjustment of the mA and/or kV according to patient size and/or use of iterative reconstruction technique. COMPARISON:  CT head dated 1 day prior and intra procedural images obtained earlier the same day FINDINGS: Brain: There is hyperdensity overlying the left cerebral convexity likely reflecting predominantly extravasated contrast from the recent procedure, though superimposed subarachnoid hemorrhage is difficult to entirely exclude. 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 There is no other evidence of evolving territorial infarct. 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 The ventricles are stable in size. Additional patchy hypodensity in the supratentorial white matter likely reflecting sequela of background chronic white matter microangiopathy is unchanged There is no solid mass lesion. There is no mass effect or midline shift. Vascular: There is calcification of the bilateral cavernous ICAs. Skull: Normal. Negative for fracture or focal lesion. Sinuses/Orbits: The imaged paranasal sinuses are clear. Globes and  orbits are unremarkable. Other: None. IMPRESSION: 1. Density overlying the left cerebral convexity is grossly similar to the intra procedural images obtained earlier the same day and is favored to reflect predominantly contrast extravasation; however, superimposed subarachnoid hemorrhage can not be entirely excluded. 2. Small focus of hypodensity in the left frontal lobe cortex suspicious for small evolving infarct. Electronically Signed   By: Valetta Mole M.D.   On: 08/06/2022 12:34   IR PERCUTANEOUS ART THROMBECTOMY/INFUSION INTRACRANIAL INC DIAG ANGIO  Result Date: 08/06/2022 INDICATION: 71 year old male with past medical history significant for hypertension, hyperlipidemia, sinus bradycardia and BPH; baseline modified Rankin scale 3. He presented to outside hospital with right-sided weakness, aphasia and gaze deviation; NIHSS 18. CT angiogram of the head and neck head equivocal findings in fall EXAM: ULTRASOUND-GUIDED VASCULAR ACCESS DIAGNOSTIC CEREBRAL ANGIOGRAM MECHANICAL THROMBECTOMY FLAT PANEL HEAD CT COMPARISON:  None Available. MEDICATIONS: Ancef 2 g IV. The antibiotic was administered within 1 hour of the procedure ANESTHESIA/SEDATION: The procedure was performed under general anesthesia. CONTRAST:  100 mL of Omnipaque 300 milligram/mL FLUOROSCOPY: Radiation Exposure Index (as provided by the fluoroscopic device): 9,326 mGy Kerma COMPLICATIONS: SIR LEVEL B - Normal therapy, includes overnight admission for observation. TECHNIQUE: Informed written consent was obtained from the patient's wife after a thorough discussion of the procedural risks, benefits and alternatives. All questions were addressed. Maximal Sterile Barrier Technique was utilized including caps, mask, sterile gowns, sterile gloves, sterile drape, hand hygiene and skin antiseptic. A timeout was performed prior to the initiation of the procedure. The right groin was prepped and draped in the usual sterile fashion. Using a micropuncture  kit and the modified Seldinger technique, access was gained to the right common femoral artery and a 5 French sheath was placed. Real-time ultrasound guidance was utilized for vascular access including the acquisition of a permanent ultrasound image documenting patency of the accessed vessel. Under fluoroscopy, a 5 [place] Berenstein 2 catheter was navigated over a  0.035" Terumo Glidewire into the aortic arch. The catheter was placed into the left common carotid artery. Frontal roadmap was obtained and the catheter was then advanced over the wire into the left internal carotid artery. Frontal and lateral angiograms of the head were obtained. FINDINGS: 1. Mild atherosclerotic changes in the left common femoral artery without significant stenosis, adequate for vascular access. 2. 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 3. Hyperemia along the a patent left M3/MCA branch to the anterior parietal region with corresponding early venous opacification, suggesting ongoing ischemia. PROCEDURE: The Berenstein 2 catheter was removed over an exchange length Rosen wire and under biplane roadmap guidance. The 5 French sheath was then removed over the wire. An 8 French 25 cm femoral sheath was advanced over the wire. Then, a zoom 88 guide catheter was advanced over the wire into the upper cervical segment of the left ICA. Frontal and lateral angiograms of the head were obtained. Using biplane roadmap guidance, a zoom 55 aspiration catheter was navigated over a phenom 21 microcatheter and a Aristotle 14 microguidewire into the cavernous segment of the left ICA. The microcatheter was then navigated over the wire into a left M3/MCA middle division branch. Attempt to advance the microcatheter over the wire proved unsuccessful due to significant resistance. The microcatheter was retracted frontal and lateral angiogram of the head were obtained. Small contrast extravasation was noted. The left M3/MCA branch appear  recanalized with a persistent nonocclusive filling defect. Flat panel CT of the head was obtained and post processed in a separate workstation with concurrent attending physician supervision. Selected images were sent to PACS. Small contrast extravasation seen along the left sylvian fissure. Follow-up left internal carotid artery angiogram showed no evidence of further contrast extravasation. Delay left internal carotid artery angiogram showed stable recanalization with no evidence of further contrast extravasation. Repeat flat panel CT of the head was obtained and post processed in a separate workstation with concurrent attending physician supervision. Selected images were sent to PACS. Similar appearance of contrast extravasation in the left sylvian fissure. Repeat delay left internal carotid artery angiogram showed stable recanalization with no evidence of further contrast extravasation. Right common femoral artery angiogram was obtained in right anterior oblique view. The puncture is at the level of the common femoral artery. The artery has normal caliber, adequate for closure device. The sheath was exchanged over the wire for a Perclose prostyle which was utilized for access closure. Immediate hemostasis was achieved. IMPRESSION: 1. 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 2. Capillary hyperemia along patent anterior left parietal M3 branch with early venous drainage suggestive of ongoing ischemia. 3. Access to occluded branch with microwire resulted in recanalization (TICI 2C) with small contrast extravasation which resolved spontaneously. Delay flat panel CT and cerebral angiogram showed no further contrast extravasation. PLAN: Repeat head CT within 3 hours to evaluate for contrast extravasation stability. Electronically Signed   By: Pedro Earls M.D.   On: 08/06/2022 11:30   IR US Guide Vasc Access Right  Result Date: 08/06/2022 INDICATION: 71 year old male with  past medical history significant for hypertension, hyperlipidemia, sinus bradycardia and BPH; baseline modified Rankin scale 3. He presented to outside hospital with right-sided weakness, aphasia and gaze deviation; NIHSS 18. CT angiogram of the head and neck head equivocal findings in fall EXAM: ULTRASOUND-GUIDED VASCULAR ACCESS DIAGNOSTIC CEREBRAL ANGIOGRAM MECHANICAL THROMBECTOMY FLAT PANEL HEAD CT COMPARISON:  None Available. MEDICATIONS: Ancef 2 g IV. The antibiotic was administered within 1  hour of the procedure ANESTHESIA/SEDATION: The procedure was performed under general anesthesia. CONTRAST:  100 mL of Omnipaque 300 milligram/mL FLUOROSCOPY: Radiation Exposure Index (as provided by the fluoroscopic device): 9,741 mGy Kerma COMPLICATIONS: SIR LEVEL B - Normal therapy, includes overnight admission for observation. TECHNIQUE: Informed written consent was obtained from the patient's wife after a thorough discussion of the procedural risks, benefits and alternatives. All questions were addressed. Maximal Sterile Barrier Technique was utilized including caps, mask, sterile gowns, sterile gloves, sterile drape, hand hygiene and skin antiseptic. A timeout was performed prior to the initiation of the procedure. The right groin was prepped and draped in the usual sterile fashion. Using a micropuncture kit and the modified Seldinger technique, access was gained to the right common femoral artery and a 5 French sheath was placed. Real-time ultrasound guidance was utilized for vascular access including the acquisition of a permanent ultrasound image documenting patency of the accessed vessel. Under fluoroscopy, a 5 [place] Berenstein 2 catheter was navigated over a 0.035" Terumo Glidewire into the aortic arch. The catheter was placed into the left common carotid artery. Frontal roadmap was obtained and the catheter was then advanced over the wire into the left internal carotid artery. Frontal and lateral angiograms of  the head were obtained. FINDINGS: 1. Mild atherosclerotic changes in the left common femoral artery without significant stenosis, adequate for vascular access. 2. 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 3. Hyperemia along the a patent left M3/MCA branch to the anterior parietal region with corresponding early venous opacification, suggesting ongoing ischemia. PROCEDURE: The Berenstein 2 catheter was removed over an exchange length Rosen wire and under biplane roadmap guidance. The 5 French sheath was then removed over the wire. An 8 French 25 cm femoral sheath was advanced over the wire. Then, a zoom 88 guide catheter was advanced over the wire into the upper cervical segment of the left ICA. Frontal and lateral angiograms of the head were obtained. Using biplane roadmap guidance, a zoom 55 aspiration catheter was navigated over a phenom 21 microcatheter and a Aristotle 14 microguidewire into the cavernous segment of the left ICA. The microcatheter was then navigated over the wire into a left M3/MCA middle division branch. Attempt to advance the microcatheter over the wire proved unsuccessful due to significant resistance. The microcatheter was retracted frontal and lateral angiogram of the head were obtained. Small contrast extravasation was noted. The left M3/MCA branch appear recanalized with a persistent nonocclusive filling defect. Flat panel CT of the head was obtained and post processed in a separate workstation with concurrent attending physician supervision. Selected images were sent to PACS. Small contrast extravasation seen along the left sylvian fissure. Follow-up left internal carotid artery angiogram showed no evidence of further contrast extravasation. Delay left internal carotid artery angiogram showed stable recanalization with no evidence of further contrast extravasation. Repeat flat panel CT of the head was obtained and post processed in a separate workstation with concurrent  attending physician supervision. Selected images were sent to PACS. Similar appearance of contrast extravasation in the left sylvian fissure. Repeat delay left internal carotid artery angiogram showed stable recanalization with no evidence of further contrast extravasation. Right common femoral artery angiogram was obtained in right anterior oblique view. The puncture is at the level of the common femoral artery. The artery has normal caliber, adequate for closure device. The sheath was exchanged over the wire for a Perclose prostyle which was utilized for access closure. Immediate hemostasis was achieved. IMPRESSION: 1. Diagnostic  angiogram revealed occlusion of a left M3/MCA branch. 2. Capillary hyperemia along patent anterior left parietal M3 branch with early venous drainage suggestive of ongoing ischemia. 3. Access to occluded branch with microwire resulted in recanalization (TICI 2C) with small contrast extravasation which resolved spontaneously. Delay flat panel CT and cerebral angiogram showed no further contrast extravasation. PLAN: Repeat head CT within 3 hours to evaluate for contrast extravasation stability. Electronically Signed   By: Pedro Earls M.D.   On: 08/06/2022 11:30   IR CT Head Ltd  Result Date: 08/06/2022 INDICATION: 71 year old male with past medical history significant for hypertension, hyperlipidemia, sinus bradycardia and BPH; baseline modified Rankin scale 3. He presented to outside hospital with right-sided weakness, aphasia and gaze deviation; NIHSS 18. CT angiogram of the head and neck head equivocal findings in fall EXAM: ULTRASOUND-GUIDED VASCULAR ACCESS DIAGNOSTIC CEREBRAL ANGIOGRAM MECHANICAL THROMBECTOMY FLAT PANEL HEAD CT COMPARISON:  None Available. MEDICATIONS: Ancef 2 g IV. The antibiotic was administered within 1 hour of the procedure ANESTHESIA/SEDATION: The procedure was performed under general anesthesia. CONTRAST:  100 mL of Omnipaque 300  milligram/mL FLUOROSCOPY: Radiation Exposure Index (as provided by the fluoroscopic device): 4,967 mGy Kerma COMPLICATIONS: SIR LEVEL B - Normal therapy, includes overnight admission for observation. TECHNIQUE: Informed written consent was obtained from the patient's wife after a thorough discussion of the procedural risks, benefits and alternatives. All questions were addressed. Maximal Sterile Barrier Technique was utilized including caps, mask, sterile gowns, sterile gloves, sterile drape, hand hygiene and skin antiseptic. A timeout was performed prior to the initiation of the procedure. The right groin was prepped and draped in the usual sterile fashion. Using a micropuncture kit and the modified Seldinger technique, access was gained to the right common femoral artery and a 5 French sheath was placed. Real-time ultrasound guidance was utilized for vascular access including the acquisition of a permanent ultrasound image documenting patency of the accessed vessel. Under fluoroscopy, a 5 [place] Berenstein 2 catheter was navigated over a 0.035" Terumo Glidewire into the aortic arch. The catheter was placed into the left common carotid artery. Frontal roadmap was obtained and the catheter was then advanced over the wire into the left internal carotid artery. Frontal and lateral angiograms of the head were obtained. FINDINGS: 1. Mild atherosclerotic changes in the left common femoral artery without significant stenosis, adequate for vascular access. 2. 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 3. Hyperemia along the a patent left M3/MCA branch to the anterior parietal region with corresponding early venous opacification, suggesting ongoing ischemia. PROCEDURE: The Berenstein 2 catheter was removed over an exchange length Rosen wire and under biplane roadmap guidance. The 5 French sheath was then removed over the wire. An 8 French 25 cm femoral sheath was advanced over the wire. Then, a zoom 88 guide  catheter was advanced over the wire into the upper cervical segment of the left ICA. Frontal and lateral angiograms of the head were obtained. Using biplane roadmap guidance, a zoom 55 aspiration catheter was navigated over a phenom 21 microcatheter and a Aristotle 14 microguidewire into the cavernous segment of the left ICA. The microcatheter was then navigated over the wire into a left M3/MCA middle division branch. Attempt to advance the microcatheter over the wire proved unsuccessful due to significant resistance. The microcatheter was retracted frontal and lateral angiogram of the head were obtained. Small contrast extravasation was noted. The left M3/MCA branch appear recanalized with a persistent nonocclusive filling defect. Flat panel CT of  the head was obtained and post processed in a separate workstation with concurrent attending physician supervision. Selected images were sent to PACS. Small contrast extravasation seen along the left sylvian fissure. Follow-up left internal carotid artery angiogram showed no evidence of further contrast extravasation. Delay left internal carotid artery angiogram showed stable recanalization with no evidence of further contrast extravasation. Repeat flat panel CT of the head was obtained and post processed in a separate workstation with concurrent attending physician supervision. Selected images were sent to PACS. Similar appearance of contrast extravasation in the left sylvian fissure. Repeat delay left internal carotid artery angiogram showed stable recanalization with no evidence of further contrast extravasation. Right common femoral artery angiogram was obtained in right anterior oblique view. The puncture is at the level of the common femoral artery. The artery has normal caliber, adequate for closure device. The sheath was exchanged over the wire for a Perclose prostyle which was utilized for access closure. Immediate hemostasis was achieved. IMPRESSION: 1.  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 2. Capillary hyperemia along patent anterior left parietal M3 branch with early venous drainage suggestive of ongoing ischemia. 3. Access to occluded branch with microwire resulted in recanalization (TICI 2C) with small contrast extravasation which resolved spontaneously. Delay flat panel CT and cerebral angiogram showed no further contrast extravasation. PLAN: Repeat head CT within 3 hours to evaluate for contrast extravasation stability. Electronically Signed   By: Pedro Earls M.D.   On: 08/06/2022 11:30   IR CT Head Ltd  Result Date: 08/06/2022 INDICATION: 71 year old male with past medical history significant for hypertension, hyperlipidemia, sinus bradycardia and BPH; baseline modified Rankin scale 3. He presented to outside hospital with right-sided weakness, aphasia and gaze deviation; NIHSS 18. CT angiogram of the head and neck head equivocal findings in fall EXAM: ULTRASOUND-GUIDED VASCULAR ACCESS DIAGNOSTIC CEREBRAL ANGIOGRAM MECHANICAL THROMBECTOMY FLAT PANEL HEAD CT COMPARISON:  None Available. MEDICATIONS: Ancef 2 g IV. The antibiotic was administered within 1 hour of the procedure ANESTHESIA/SEDATION: The procedure was performed under general anesthesia. CONTRAST:  100 mL of Omnipaque 300 milligram/mL FLUOROSCOPY: Radiation Exposure Index (as provided by the fluoroscopic device): 1,324 mGy Kerma COMPLICATIONS: SIR LEVEL B - Normal therapy, includes overnight admission for observation. TECHNIQUE: Informed written consent was obtained from the patient's wife after a thorough discussion of the procedural risks, benefits and alternatives. All questions were addressed. Maximal Sterile Barrier Technique was utilized including caps, mask, sterile gowns, sterile gloves, sterile drape, hand hygiene and skin antiseptic. A timeout was performed prior to the initiation of the procedure. The right groin was prepped and draped in  the usual sterile fashion. Using a micropuncture kit and the modified Seldinger technique, access was gained to the right common femoral artery and a 5 French sheath was placed. Real-time ultrasound guidance was utilized for vascular access including the acquisition of a permanent ultrasound image documenting patency of the accessed vessel. Under fluoroscopy, a 5 [place] Berenstein 2 catheter was navigated over a 0.035" Terumo Glidewire into the aortic arch. The catheter was placed into the left common carotid artery. Frontal roadmap was obtained and the catheter was then advanced over the wire into the left internal carotid artery. Frontal and lateral angiograms of the head were obtained. FINDINGS: 1. Mild atherosclerotic changes in the left common femoral artery without significant stenosis, adequate for vascular access. 2. 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 3. Hyperemia along the a patent left M3/MCA branch to the anterior parietal  region with corresponding early venous opacification, suggesting ongoing ischemia. PROCEDURE: The Berenstein 2 catheter was removed over an exchange length Rosen wire and under biplane roadmap guidance. The 5 French sheath was then removed over the wire. An 8 French 25 cm femoral sheath was advanced over the wire. Then, a zoom 88 guide catheter was advanced over the wire into the upper cervical segment of the left ICA. Frontal and lateral angiograms of the head were obtained. Using biplane roadmap guidance, a zoom 55 aspiration catheter was navigated over a phenom 21 microcatheter and a Aristotle 14 microguidewire into the cavernous segment of the left ICA. The microcatheter was then navigated over the wire into a left M3/MCA middle division branch. Attempt to advance the microcatheter over the wire proved unsuccessful due to significant resistance. The microcatheter was retracted frontal and lateral angiogram of the head were obtained. Small contrast extravasation  was noted. The left M3/MCA branch appear recanalized with a persistent nonocclusive filling defect. Flat panel CT of the head was obtained and post processed in a separate workstation with concurrent attending physician supervision. Selected images were sent to PACS. Small contrast extravasation seen along the left sylvian fissure. Follow-up left internal carotid artery angiogram showed no evidence of further contrast extravasation. Delay left internal carotid artery angiogram showed stable recanalization with no evidence of further contrast extravasation. Repeat flat panel CT of the head was obtained and post processed in a separate workstation with concurrent attending physician supervision. Selected images were sent to PACS. Similar appearance of contrast extravasation in the left sylvian fissure. Repeat delay left internal carotid artery angiogram showed stable recanalization with no evidence of further contrast extravasation. Right common femoral artery angiogram was obtained in right anterior oblique view. The puncture is at the level of the common femoral artery. The artery has normal caliber, adequate for closure device. The sheath was exchanged over the wire for a Perclose prostyle which was utilized for access closure. Immediate hemostasis was achieved. IMPRESSION: 1. 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 2. Capillary hyperemia along patent anterior left parietal M3 branch with early venous drainage suggestive of ongoing ischemia. 3. Access to occluded branch with microwire resulted in recanalization (TICI 2C) with small contrast extravasation which resolved spontaneously. Delay flat panel CT and cerebral angiogram showed no further contrast extravasation. PLAN: Repeat head CT within 3 hours to evaluate for contrast extravasation stability. Electronically Signed   By: Pedro Earls M.D.   On: 08/06/2022 11:30   CT ANGIO HEAD NECK W WO CM W PERF (CODE  STROKE)  Result Date: 08/06/2022 CLINICAL DATA:  71 year old male code stroke presentation, left MCA symptomatology. EXAM: CT ANGIOGRAPHY HEAD AND NECK CT PERFUSION BRAIN TECHNIQUE: Multidetector CT imaging of the head and neck was performed using the standard protocol during bolus administration of intravenous contrast. Multiplanar CT image reconstructions and MIPs were obtained to evaluate the vascular anatomy. Carotid stenosis measurements (when applicable) are obtained utilizing NASCET criteria, using the distal internal carotid diameter as the denominator. Multiphase CT imaging of the brain was performed following IV bolus contrast injection. Subsequent parametric perfusion maps were calculated using RAPID software. RADIATION DOSE REDUCTION: This exam was performed according to the departmental dose-optimization program which includes automated exposure control, adjustment of the mA and/or kV according to patient size and/or use of iterative reconstruction technique. CONTRAST:  179m OMNIPAQUE IOHEXOL 350 MG/ML SOLN COMPARISON:  Plain head CT 0449 hours today. CT cervical spine reported separately. FINDINGS: CT Brain Perfusion Findings:  ASPECTS: Ten CBF (<30%) Volume: 58m. CBV parameter changes of 13-16 mL at the left operculum. The Perfusion (Tmax>6.0s) volume: 1267m  Hypoperfusion index 0.2. Mismatch Volume: 12317mnfarction Location:Left MCA territory CTA NECK Skeleton: Cervical spine CT detailed separately. No acute osseous abnormality identified. Upper chest: Mild dependent atelectasis. Mild retained secretions in the upper thoracic esophagus. Other neck: Left thyroid nodules smaller than 1.5 cm Not clinically significant; no follow-up imaging recommended (ref: J Am Coll Radiol. 2015 Feb;12(2): 143-50).Otherwise negative. Aortic arch: Calcified aortic atherosclerosis. 3 vessel arch configuration. Right carotid system: Minimal plaque. Mild motion artifact distal to the right ICA bulb. No stenosis. Left  carotid system: Mild tortuosity and minimal plaque. No stenosis. Vertebral arteries: Minimal plaque except at the left vertebral artery origin and V1 segment (series 7, image 290) where there is mild stenosis. No other stenosis along the course of both vertebral arteries to the skull base. CTA HEAD Posterior circulation: Moderate left vertebral artery soft plaque and stenosis series 7, image 155. This is proximal to the left PICA origin which remains patent. Patent vertebrobasilar junction. No distal right vertebral stenosis. Patent basilar artery without stenosis. Patent SCA and PCA origins. Posterior communicating arteries are diminutive or absent. Bilateral PCA branches appear fairly symmetric and within normal limits. No proximal left PCA occlusion. Anterior circulation: Bilateral ICA siphon atherosclerosis, with up to moderate plaque. Mild to moderate supraclinoid left ICA stenosis series 9, image 101. Contralateral right ICA siphon stenosis. Patent carotid termini. Patent MCA and ACA origins. Anterior communicating artery and bilateral ACA branches are within normal limits. Median artery of the corpus callosum (normal variant). Right MCA M1 segment and trifurcation are patent without stenosis. Right MCA branches are within normal limits. Left MCA M1 segment and left MCA trifurcation are patent. No left MCA M2 branch occlusion is identified. However, there is evidence of middle division M3 branch occlusion on series 12, image 26, corresponding to the level of the operculum. Venous sinuses: Grossly patent. Anatomic variants: Median artery of the corpus callosum. Review of the MIP images confirms the above findings Preliminary report of the above discussed by telephone with Dr. CHRJoseph Berkshire 08/06/2022 at 05:22 . IMPRESSION: 1. CT Perfusion is positive for left MCA territory oligemia, proximally 120 mL. No infarct core detected by strict criteria, but abnormal CBV at the left operculum. 2. No large vessel  occlusion identified on CTA, but left M3 operculum branch occlusion(s) suspected. Still, given the CTP findings and presentation recommend discussion with Neurology for consideration of NIR intervention. 3. The above was discussed by telephone with Dr. CHRJoseph Berkshire the ED on 08/06/2022 at 0522 hours, and also Dr. BhaCurly Shores [phone]. 4. 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 Bilateral ICA siphon plaque with up to moderate Left supraclinoid stenosis. Moderate Left Vertebral V4 stenosis. No proximal Left PCA branch occlusion (small age indeterminate left occipital pole infarct on plain CT). Electronically Signed   By: H  Genevie AnnD.   On: 08/06/2022 05:34   CT Cervical Spine Wo Contrast  Result Date: 08/06/2022 CLINICAL DATA:  [age]ar old male code stroke patient. Right side weakness. EXAM: CT CERVICAL SPINE WITHOUT CONTRAST TECHNIQUE: Multidetector CT imaging of the cervical spine was performed without intravenous contrast. Multiplanar CT image reconstructions were also generated. RADIATION DOSE REDUCTION: This exam was performed according to the departmental dose-optimization program which includes automated exposure control, adjustment of the mA and/or kV according to patient size and/or use of iterative reconstruction technique. COMPARISON:  Plain head CT the same  time reported separately. FINDINGS: Alignment: Straightening of cervical lordosis. Cervicothoracic junction alignment is within normal limits. Bilateral posterior element alignment is within normal limits. Skull base and vertebrae: Visualized skull base is intact. No atlanto-occipital dissociation. C1 and C2 appear intact and aligned. No acute osseous abnormality identified. Soft tissues and spinal canal: No prevertebral fluid or swelling. No visible canal hematoma. Elongated stylohyoid ligament calcification greater on the left. Disc levels: Widespread advanced cervical spine degeneration. Multilevel mild and up  to moderate cervical spinal stenosis (series 9, image 31). Upper chest: Mild apical lung scarring. Chronic appearing mild T1 superior endplate deformity, Schmorl's node. Other: 14 mm left thyroid nodule, Not clinically significant; no follow-up imaging recommended (ref: J Am Coll Radiol. 2015 Feb;12(2): 143-50). IMPRESSION: 1. No acute traumatic injury identified in the cervical spine. 2. 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 Electronically Signed   By: Genevie Ann M.D.   On: 08/06/2022 05:19   CT HEAD CODE STROKE WO CONTRAST  Result Date: 08/06/2022 CLINICAL DATA:  Code stroke. 71 year old male. Fall at 0300 hours. Right side weakness and abnormal speech. EXAM: CT HEAD WITHOUT CONTRAST TECHNIQUE: Contiguous axial images were obtained from the base of the skull through the vertex without intravenous contrast. RADIATION DOSE REDUCTION: This exam was performed according to the departmental dose-optimization program which includes automated exposure control, adjustment of the mA and/or kV according to patient size and/or use of iterative reconstruction technique. COMPARISON:  None Available. FINDINGS: Brain: Cerebral volume is within normal limits for age. No midline shift, mass effect, or evidence of intracranial mass lesion. No ventriculomegaly. No acute intracranial hemorrhage identified. Patchy and confluent bilateral cerebral white matter hypodensity, fairly symmetric. No cortically based acute infarct identified in the left MCA territory. But there is confluent hypodensity in the left occipital pole in a small area on series 5, image 16 (about 15-20 mm). Vascular: Calcified atherosclerosis at the skull base. No suspicious intracranial vascular hyperdensity. Skull: No acute osseous abnormality identified. Sinuses/Orbits: Visualized paranasal sinuses and mastoids are clear. Other: Leftward gaze. Scalp soft tissues appear within normal limits.  ASPECTS [facility] Stroke Program Early CT Score) Total score (0-10 with 10 being normal): 10 (cytotoxic edema only in the left PCA territory). IMPRESSION: 1. Leftward gaze deviation, raising the possibility of cortical ischemia in this setting. No Left MCA territory cortical infarct changes are identified. But there is a small age indeterminate Left PCA infarct at the left occipital pole. 2. No acute intracranial hemorrhage identified. ASPECTS 10. 3. Advanced bilateral white matter changes most commonly due to small vessel disease. Study discussed by telephone with Dr. Joseph Berkshire on 08/06/2022 at 05:11 . Electronically Signed   By: Genevie Ann M.D.   On: 08/06/2022 05:11    Labs:  CBC: Recent Labs    08/06/22 0437  WBC 4.2  HGB 13.7  HCT 40.5  PLT 210    COAGS: Recent Labs    08/06/22 0437  INR 1.2  APTT 28    BMP: Recent Labs    08/06/22 0437 08/07/22 0241  NA 143 142  K 4.3 4.1  CL 109 114*  CO2 27 18*  GLUCOSE 114* 91  BUN 10 6*  CALCIUM 9.4 8.6*  CREATININE 1.00 0.94  GFRNONAA >60 >60    LIVER FUNCTION TESTS: Recent Labs    08/06/22 0437 08/07/22 0241  BILITOT 0.7 0.8  AST 14* 22  ALT 13 11  ALKPHOS 66 58  PROT 6.9 5.7*  ALBUMIN 3.9 3.2*    Assessment and Plan:  Code Stroke  S/P DIAGNOSTIC CEREBRAL ANGIOGRAM AND ENDOVASCULAR REVASCULARIZATION   Occlusion of a Left M3/MCA middle division branch. Access achieved with microwire to the occluded branch. Attempt to navigate microcatheter was met with significant resistance. Angiogram then showed contrast extravasation with recanalization of the occluded branch (TICI 2C). Extravasation confirmed on flat panel CT. Delay angiograms showed no further extravasation, stable for over 30 minutes. Follow-up flat panel CT showed no progression of extravasation.   Care per neurology.  Tachycardia =  RN obtaining 12 lead EKG = recommend cardiology evaluation.  Electronically Signed: Murrell Redden, PA-C 08/07/2022,  3:57 PM    I spent a total of 15 Minutes at the the patient's bedside AND on the patient's hospital floor or unit, greater than 50% of which was counseling/coordinating care for f/u cerebral intervention.

## 2022-08-07 NOTE — Progress Notes (Signed)
Occupational Therapy Evaluation  PTA pt lives independently with his wife and enjoys being active and doing yard work. Able to progress OOB and ambulate in hall with +2 min A @ RW level. Mod multimodal cues required to attempt midline gaze due to R neglect and deficits listed below. Pt emotional and crying at times during session due to apparent frustration with deficits and inability to complete functional tasks. L field occluded with glasses using 80M tape to increase attention and visual scanning to R. Encouraged wife to have pt wear glasses as tolerated throughout the day. Due to significant change in functional status, recommend intensive rehab at [facility] to facilitate safe DC home with wife. VSS.     08/07/22 1043  OT Visit Information  Last OT Received On 08/07/22  Assistance Needed +1  PT/OT/SLP Co-Evaluation/Treatment Yes  Reason for Co-Treatment To address functional/ADL transfers;For patient/therapist safety  OT goals addressed during session ADL's and self-care  History of Present Illness The pt is a 71 yo male presenting 8/17 after a fall with slurred speech and R-sided weakness with L gaze. 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 PMH: hypertension, hyperlipidemia, sinus bradycardia, BPH, vascular dementia (dx in Dec, 2022 per family)  Precautions  Precautions Fall  Restrictions  Weight Bearing Restrictions No  Other Position/Activity Restrictions SBP 120-150  Home Living  Family/patient expects to be discharged to: Private residence  Living Arrangements Spouse/significant other  Available Help at Discharge Family;Available 24 hours/day  Type of [place] to enter  Entrance Stairs-Number of Steps 3  Entrance Stairs-Rails None  Home Layout One level  Bathroom Shower/Tub Tub/shower  unit  Bathroom Toilet Handicapped height  Bathroom Accessibility Yes  How Accessible Accessible via walker  [place] - single point;BSC/3in1;Shower seat   Lives With Spouse  Prior Function  Prior Level of Function  Independent/Modified Independent;Driving  Mobility Comments no use of DME, enjoys working in yard  ADLs Comments independent  Communication  Communication Expressive difficulties  Pain Assessment  Pain Assessment Faces  Faces Pain Scale 0  Cognition  Arousal/Alertness Awake/alert  Behavior During Therapy Impulsive (emotional; crying at times - appeasr due ot his awareness/frustration regarding his deficits)  Overall Cognitive Status Difficult to assess  General Comments appears apraxic; following commands with gestural cues at times; ableto complete routine tasks without cues; will further assess  Difficult to assess due to Impaired communication  Upper Extremity Assessment  Upper Extremity Assessment RUE deficits/detail  RUE Deficits / Details moving out of synergy pattern with attempts to use weak R hand; stronger proximally and able to complete full AAROM of shoulder and hand to mouth; poor in-hand  manipulation skills; able to grasp/release wash cloth and use as a functional assist 50% of time  RUE Sensation decreased light touch  RUE Coordination decreased fine motor;decreased gross motor  Lower Extremity Assessment  Lower Extremity Assessment Defer to PT evaluation  Cervical / Trunk Assessment  Cervical / Trunk Assessment Other exceptions (forward head; L preference)  Vision- History  Baseline Vision/History 1 Wears glasses (not in room)  Vision- Assessment  Vision Assessment? Vision impaired- to be further tested in functional context  Additional Comments L gaze preference' with cues, able to atten at midline; L gaze prefence worse with mobility, unable to turn head tooward midline; running into objects and unaware; ? R  field cut  Perception   Perception Tested? Yes  Perception Deficits Inattention/neglect;Spatial orientation  Inattention/Neglect Does not attend to right visual field;Does not attend to right side of body  Spatial deficits poor awareness or R environment; not correcting with cues at this time  Praxis  Praxis tested? Deficits  Deficits Initiation;Ideomotor;Perseveration  ADL  Overall ADL's  Needs assistance/impaired  Eating/Feeding NPO  Grooming Moderate assistance  Upper Body Bathing Moderate assistance  Lower Body Bathing Moderate assistance  Upper Body Dressing  Moderate assistance  Lower Body Dressing Moderate assistance  Toilet Transfer Minimal assistance  Functional mobility during ADLs Minimal assistance;+2 for safety/equipment (for mobility)  Bed Mobility  Overal bed mobility Needs Assistance  Bed Mobility Supine to Sit  Supine to sit Min guard  Transfers  Overall transfer level Needs assistance  Transfers Sit to/from Stand  Sit to Stand Min assist;+2 safety/equipment  Balance  Overall balance assessment Needs assistance  Sitting-balance support Feet supported  Sitting balance-Leahy Scale Fair  Standing balance-Leahy Scale Poor  General Comments  General comments (skin integrity, edema, etc.) glasses provided with L field occluded with 64M tape to increase atttention to R; improved ability to maintain attention to midline and scan into R field with use of glasses; Wife educated on use of glasses to increase visual scanning and attention to R; encouraged pt/wife to have him wear glasses for short periods of time throughout the day. Educated wife on sitting toward pt's R side ot increase attention/scanning to R  OT - End of Session  Equipment Utilized During Treatment Gait belt;Rolling walker (2 wheels)  Activity Tolerance Patient tolerated treatment well  Patient left in chair;with call bell/phone within reach;with chair alarm set;with family/visitor present  OT Assessment  OT  Recommendation/Assessment Patient needs continued OT Services  OT Visit Diagnosis Unsteadiness on feet (R26.81);Other abnormalities of gait and mobility (R26.89);Muscle weakness (generalized) (M62.81);Apraxia (R48.2);Feeding difficulties (R63.3);Other symptoms and signs involving cognitive function;Hemiplegia and hemiparesis  Hemiplegia - Right/Left Right  Hemiplegia - dominant/non-dominant Dominant  Hemiplegia - caused by Cerebral infarction;Nontraumatic SAH  OT Problem List Decreased strength;Decreased range of motion;Decreased activity tolerance;Impaired balance (sitting and/or standing);Impaired vision/perception;Decreased coordination;Decreased cognition;Decreased safety awareness;Decreased knowledge of use of DME or AE;Impaired sensation;Impaired tone;Impaired UE functional use  OT Plan  OT Frequency (ACUTE ONLY) Min 2X/week  OT Treatment/Interventions (ACUTE ONLY) Self-care/ADL training;Therapeutic exercise;Neuromuscular education;DME and/or AE instruction;Therapeutic activities;Cognitive remediation/compensation;Visual/perceptual remediation/compensation;Patient/family education;Balance training  AM-PAC OT "6 Clicks" Daily Activity Outcome Measure (Version 2)  Help from another person eating meals? 1  Help from another person taking care of personal grooming? 2  Help from another person toileting, which includes using toliet, bedpan, or urinal? 1  Help from another person bathing (including washing, rinsing, drying)? 2  Help from another person to put on and taking off regular upper body clothing? 2  Help from another person to put on and taking off regular lower body clothing? 2  6 Click Score 10  Progressive Mobility  What is the highest level of mobility based on the progressive mobility assessment? Level 5 (Walks with assist in room/hall) - Balance while stepping forward/back and can walk in room with assist - Complete  Activity Ambulated with assistance in hallway  OT Recommendation   Recommendations for Other Services Rehab consult  Follow Up Recommendations Acute inpatient rehab (3hours/day)  Assistance recommended at discharge Frequent or constant Supervision/Assistance  Patient can return home with the following A lot of help with walking and/or transfers;A lot of help with bathing/dressing/bathroom;Assistance with cooking/housework;Assistance with feeding;Direct  supervision/assist for medications management;Direct supervision/assist for financial management;Assist for transportation;Help with stairs or ramp for entrance  Functional Status Assessent Patient has had a recent decline in their functional status and demonstrates the ability to make significant improvements in function in a reasonable and predictable amount of time.  OT Equipment None recommended by OT  Individuals Consulted  Consulted and Agree with Results and Recommendations Patient;Family member/caregiver  Family Member Consulted wife  Acute Rehab OT Goals  Patient Stated Goal per family for pt to get better  OT Goal Formulation With patient/family  Time For Goal Achievement 08/21/22  Potential to Achieve Goals Good  OT Time Calculation  OT Start Time (ACUTE ONLY) 0831  OT Stop Time (ACUTE ONLY) 0912  OT Time Calculation (min) 41 min  OT General Charges  $OT Visit 1 Visit  OT Evaluation  $OT Eval Moderate Complexity 1 Mod  OT Treatments  $Self Care/Home Management  8-22 mins  Written Expression  Dominant Hand Right  Dustin Blanchard, OT/L   Acute OT Clinical Specialist Acute Rehabilitation Services Pager [phone] Office [phone]

## 2022-08-07 NOTE — Progress Notes (Signed)
Modified Barium Swallow Progress Note  Patient Details  Name: Dustin Blanchard MRN: [id number] Date of Birth: 1951-10-16  Today's Date: 08/07/2022  Modified Barium Swallow completed.  Full report located under Chart Review in the Imaging Section.  Brief recommendations include the following:  Clinical Impression  Pt presents with oropharyngeal dysphagia characterized by weak lingual manipulation, a pharyngeal delay, and reduction in bolus cohesion, tongue base retraction, anterior laryngeal movement, epiglottic inversion, and pharyngeal constriction. He exhibited premature spillage to the pyriform sinuses, vallecular residue, pyriform sinus residue, and reduced laryngeal closure. He demonstrated penetration (PAS 3, 5) and/or silent aspiration (PAS 8) across consistencies. Laryngeal invasion was secondary to the pharyngeal delay, reduced laryngeal closure, and residue in the pyriform sinuses. Episodes of aspiration intermittently triggered subtle throat clearing, but no instances resulted in coughing, and throat clearing was ineffective in expelling material. More viscous boluses (i.e., thickened liquids/solids) reduced the immediacy of aspiration, but aspiration was still noted from residue. Multiple secondary swallows reduced pharyngeal residue, but not to a functional level. It is recommended that the pt's NPO status be maintained and that short-term non-oral alimentation (e.g., cortrak) be initiated. Pt may have limited (i.e., ~4-5/hour) individual ice chips after thorough oral care and SLP will follow for dysphagia treatment.   Swallow Evaluation Recommendations       SLP Diet Recommendations: NPO;Ice chips PRN after oral care       Medication Administration: Via alternative means               Oral Care Recommendations: Oral care QID;Staff/trained caregiver to provide oral care      Kong Packett I. Vear Clock, MS, CCC-SLP Acute Rehabilitation Services Office number  [phone]   Scheryl Marten 08/07/2022,3:16 PM

## 2022-08-07 NOTE — Procedures (Signed)
Cortrak  Tube Type:  Cortrak - 43 inches Tube Location:  Left nare Initial Placement:  Stomach Secured by: Bridle Technique Used to Measure Tube Placement:  Marking at nare/corner of mouth Cortrak Secured At:  73 cm   Cortrak Tube Team Note:  Consult received to place a Cortrak feeding tube.   X-ray is required, abdominal x-ray has been ordered by the Cortrak team. Please confirm tube placement before using the Cortrak tube.   If the tube becomes dislodged please keep the tube and contact the Cortrak team at [email or web address] (password [email or web address]) for replacement.  If after hours and replacement cannot be delayed, place a NG tube and confirm placement with an abdominal x-ray.    Betsey Holiday MS, RD, LDN Please refer to [facility] for RD and/or RD on-call/weekend/after hours pager

## 2022-08-07 NOTE — Progress Notes (Addendum)
    08/07/22 0758  Provider Notification  Provider Name/Title P. Sethi  Date Provider Notified 08/07/22  Time Provider Notified [phone]  Method of Notification Page  Notification Reason Other (Comment) (frequent PVCs, bradying into 30s-40s unsustained)  Provider response No new orders  Date of Provider Response 08/07/22  Time of Provider Response 0815     08/07/22 1455  Provider Notification  Provider Name/Title p sethi  Date Provider Notified 08/07/22  Time Provider Notified 1455  Method of Notification Call  Notification Reason Other (Comment) (pt afib rvr)  Provider response See new orders (cardizem gtt, cards consult)  Date of Provider Response 08/07/22  Time of Provider Response 1457

## 2022-08-07 NOTE — TOC CAGE-AID Note (Signed)
Transition of Care [facility]) - CAGE-AID Screening   Patient Details  Name: Dustin Blanchard MRN: [id number] Date of Birth: 22-May-1951  [facility] CM/SW Contact:    Carley Hammed, LCSWA Phone Number: 08/07/2022, 9:48 AM   Clinical Narrative: Per chart review, pt is not appropriate for CAGE- AID due to disorientation.   CAGE-AID Screening: Substance Abuse Screening unable to be completed due to: : Patient unable to participate             Substance Abuse Education Offered: No

## 2022-08-07 NOTE — Evaluation (Signed)
Clinical/Bedside Swallow Evaluation Patient Details  Name: Keahi Mccarney MRN: [id number] Date of Birth: May 31, 1951  Today's Date: 08/07/2022 Time: SLP Start Time (ACUTE ONLY): 0931 SLP Stop Time (ACUTE ONLY): 0941 SLP Time Calculation (min) (ACUTE ONLY): 10 min  Past Medical History:  Past Medical History:  Diagnosis Date   Dementia [facility])    Past Surgical History:  Past Surgical History:  Procedure Laterality Date   HERNIA REPAIR     HIP SURGERY     IR CT HEAD LTD  08/06/2022   IR CT HEAD LTD  08/06/2022   IR PERCUTANEOUS ART THROMBECTOMY/INFUSION INTRACRANIAL INC DIAG ANGIO  08/06/2022   IR US GUIDE VASC ACCESS RIGHT  08/06/2022   HPI:  Pt is a 71 y.o. male who presented with right-sided weakness and aphasia. CT head: Density overlying the left cerebral convexity is grossly similar  to the intra procedural images obtained earlier the same day and is  favored to reflect predominantly contrast extravasation; however,  superimposed subarachnoid hemorrhage can not be entirely excluded. Small focus of hypodensity in the left frontal lobe cortex suspicious for small evolving infarct. PMH: hypertension, hyperlipidemia, sinus bradycardia, BPH, vascular dementia (dx in Dec, 2022 per family)    Assessment / Plan / Recommendation  Clinical Impression  Pt was seen for bedside swallow evaluation with his wife and daughter present who both denied the pt having a history of dysphagia. Oral mechanism exam was limited due to pt's difficulty following commands; however, right-sided facial weakness and reduced lingual strength were noted. Adequate, natural dentition was noted. Pt exhibited symptoms of oropharyngeal dysphagia characterized by prolonged bolus manipulation, a suspected pharyngeal delay, mild lingual residue, and signs of aspiration with thin liquids via cup and straw. A modified barium swallow study is recommended to further assess physiology and it is scheduled for today at 1300. SLP Visit  Diagnosis: Dysphagia, unspecified (R13.10)    Aspiration Risk  Mild aspiration risk    Diet Recommendation NPO   Medication Administration: Crushed with puree    Other  Recommendations Oral Care Recommendations: Oral care BID    Recommendations for follow up therapy are one component of a multi-disciplinary discharge planning process, led by the attending physician.  Recommendations may be updated based on patient status, additional functional criteria and insurance authorization.  Follow up Recommendations        Assistance Recommended at Discharge Frequent or constant Supervision/Assistance  Functional Status Assessment Patient has had a recent decline in their functional status and demonstrates the ability to make significant improvements in function in a reasonable and predictable amount of time.  Frequency and Duration min 2x/week  2 weeks       Prognosis Prognosis for Safe Diet Advancement: Good Barriers to Reach Goals: Cognitive deficits;Language deficits;Severity of deficits      Swallow Study   General Date of Onset: 08/06/22 HPI: Pt is a 71 y.o. male who presented with right-sided weakness and aphasia. CT head: Density overlying the left cerebral convexity is grossly similar  to the intra procedural images obtained earlier the same day and is  favored to reflect predominantly contrast extravasation; however,  superimposed subarachnoid hemorrhage can not be entirely excluded. Small focus of hypodensity in the left frontal lobe cortex suspicious for small evolving infarct. PMH: hypertension, hyperlipidemia, sinus bradycardia, BPH, vascular dementia (dx in Dec, 2022 per family) Type of Study: Bedside Swallow Evaluation Previous Swallow Assessment: none Diet Prior to this Study: NPO Temperature Spikes Noted: No Respiratory Status: Room air History of Recent  Intubation: No Behavior/Cognition: Alert;Cooperative;Pleasant mood;Requires cueing;Doesn't follow directions Oral  Cavity Assessment: Within Functional Limits Oral Care Completed by SLP: Recent completion by staff Vision: Functional for self-feeding Self-Feeding Abilities: Needs assist Patient Positioning: Upright in bed Baseline Vocal Quality: Normal Volitional Cough: Cognitively unable to elicit Volitional Swallow: Unable to elicit    Oral/Motor/Sensory Function Overall Oral Motor/Sensory Function: Mild impairment Facial ROM: Reduced right;Suspected CN VII (facial) dysfunction Facial Symmetry: Abnormal symmetry right;Suspected CN VII (facial) dysfunction Facial Strength: Reduced right;Suspected CN VII (facial) dysfunction Lingual Strength: Reduced;Suspected CN XII (hypoglossal) dysfunction   Ice Chips Ice chips: Within functional limits Presentation: Spoon   Thin Liquid Thin Liquid: Impaired Presentation: Straw;Cup Oral Phase Impairments: Reduced labial seal Oral Phase Functional Implications: Right anterior spillage Pharyngeal  Phase Impairments: Throat Clearing - Immediate;Wet Vocal Quality    Nectar Thick Nectar Thick Liquid: Not tested   Honey Thick Honey Thick Liquid: Not tested   Puree Puree: Impaired Presentation: Spoon Oral Phase Impairments: Reduced labial seal Oral Phase Functional Implications: Oral residue;Oral holding   Solid     Solid: Not tested     Encarnacion Bole I. Vear Clock, MS, CCC-SLP Acute Rehabilitation Services Office number [phone]  Scheryl Marten 08/07/2022,10:06 AM

## 2022-08-07 NOTE — Evaluation (Signed)
Physical Therapy Evaluation Patient Details Name: Dustin Blanchard MRN: [id number] DOB: 1951-09-19 Today's Date: 08/07/2022  History of Present Illness  The pt is a 71 yo male presenting 8/17 after a fall with slurred speech and R-sided weakness with L gaze. 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 PMH: hypertension, hyperlipidemia, sinus bradycardia, BPH, vascular dementia (dx in Dec, 2022 per family).    Clinical Impression  Pt in bed upon arrival of PT, agreeable to evaluation at this time. Prior to admission the pt was independent with all mobility without need for DME, enjoys working in yard, family reports no assist needed for ADLs and was still driving. The pt now presents with limitations in functional mobility, strength, coordination, endurance, stability, and L gaze due to above dx, and will continue to benefit from skilled PT to address these deficits. The pt was able to complete bed mobility with use of LUE, but required BUE support for all OOB mobility and ambulation. No buckling noted in RLE, but pt with L gaze and drifting with gait. Running into objects both to R and at midline due to poor visual scanning and head movements despite cues. Given prior level of independence and significant family support, recommend acute inpatient rehab after d/c to maximize functional recovery.        Recommendations for follow up therapy are one component of a multi-disciplinary discharge planning process, led by the attending physician.  Recommendations may be updated based on patient status, additional functional criteria and insurance authorization.  Follow Up Recommendations Acute inpatient rehab (3hours/day)      Assistance Recommended at Discharge Frequent or constant Supervision/Assistance  Patient can return home with  the following  A lot of help with walking and/or transfers;A lot of help with bathing/dressing/bathroom;Assistance with cooking/housework;Assistance with feeding;Assist for transportation;Direct supervision/assist for financial management;Direct supervision/assist for medications management;Help with stairs or ramp for entrance    Equipment Recommendations  (defer to post acute)  Recommendations for Other Services  Rehab consult    Functional Status Assessment Patient has had a recent decline in their functional status and demonstrates the ability to make significant improvements in function in a reasonable and predictable amount of time.     Precautions / Restrictions Precautions Precautions: Fall Restrictions Weight Bearing Restrictions: No Other Position/Activity Restrictions: SBP 120-150      Mobility  Bed Mobility Overal bed mobility: Needs Assistance Bed Mobility: Supine to Sit     Supine to sit: Min guard     General bed mobility comments: minG with use of LUE on bed rail, able to scoot when cued    Transfers Overall transfer level: Needs assistance Equipment used: 2 person hand held assist, Rolling walker (2 wheels) Transfers: Sit to/from Stand Sit to Stand: Min assist, +2 safety/equipment           General transfer comment: minA to rise and steady, able to look left and pivot to recliner. increased cues for hand placement with sit-stand    Ambulation/Gait Ambulation/Gait assistance: Min assist, +2 safety/equipment Gait Distance (Feet): 45 Feet Assistive device: Rolling walker (2 wheels) Gait Pattern/deviations: Step-through pattern, Narrow base of support, Shuffle, Drifts right/left Gait velocity: decreased Gait velocity interpretation: <1.31 ft/sec, indicative of household ambulator   General Gait Details: pt with no knee buckling, poor attention to R, needing cues to maintain hands on RW. gaze to L  despite max cues to attend to midline through  session.     Modified Rankin (Stroke Patients Only) Modified Rankin (Stroke Patients Only) Pre-Morbid Rankin Score: No symptoms Modified Rankin: Severe disability     Balance Overall balance assessment: Needs assistance Sitting-balance support: Feet supported Sitting balance-Leahy Scale: Fair     Standing balance support: During functional activity, Bilateral upper extremity supported Standing balance-Leahy Scale: Poor Standing balance comment: BUE support for static and dynamic gait                             Pertinent Vitals/Pain Pain Assessment Pain Assessment: Faces Faces Pain Scale: No hurt Pain Intervention(s): Monitored during session    Home Living Family/patient expects to be discharged to:: Private residence Living Arrangements: Spouse/significant other Available Help at Discharge: Family;Available 24 hours/day Type of Home: House Home Access: Stairs to enter Entrance Stairs-Rails: None Entrance Stairs-Number of Steps: 3   Home Layout: One level Home Equipment: Cane - single point;BSC/3in1;Shower seat      Prior Function Prior Level of Function : Independent/Modified Independent;Driving             Mobility Comments: no use of DME, enjoys working in yard ADLs Comments: independent     Engineer, 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[employer]   Dominant Hand: Right    Extremity/Trunk Assessment   Upper Extremity Assessment Upper Extremity Assessment: Defer to OT evaluation;RUE deficits/detail RUE Deficits / Details: moving out of synergy pattern with attempts to use weak R hand; stronger proximally and able to complete full AAROM of shoulder and hand to mouth; poor in-hand  manipulation skills; able to grasp/release wash cloth and use as a functional assist 50% of time RUE Sensation: decreased light touch RUE Coordination: decreased fine motor;decreased gross motor    Lower Extremity Assessment Lower Extremity Assessment: RLE deficits/detail RLE Deficits / Details:  apraxic with movements, limited against gravity but no buckling noted with standing activity. impaired command following further limited assessment. RLE Coordination: decreased gross motor;decreased fine motor    Cervical / Trunk Assessment Cervical / Trunk Assessment: Other exceptions (forward head; L preference)  Communication   Communication: Expressive difficulties  Cognition Arousal/Alertness: Awake/alert Behavior During Therapy: Impulsive (emotional; crying at times - appeasr due ot his awareness/frustration regarding his deficits) Overall Cognitive Status: Difficult to assess                                 General Comments: appears apraxic; following commands with gestural cues at times; ableto complete routine tasks without cues; will further assess        General Comments General comments (skin integrity, edema, etc.): VSS on RA, wife present and encouraged to sit at midline or in pt's right visual field to improve attention and awareness        Assessment/Plan    PT Assessment Patient needs continued PT services  PT Problem List Decreased strength;Decreased range of motion;Decreased activity tolerance;Decreased balance;Decreased mobility;Decreased coordination;Decreased cognition;Decreased safety awareness       PT Treatment Interventions DME instruction;Gait training;Stair training;Functional mobility training;Therapeutic activities;Therapeutic exercise;Balance training;Patient/family education    PT Goals (Current goals can be found in the Care Plan section)  Acute Rehab PT Goals Patient Stated Goal: rehab and return home PT Goal Formulation: With patient Time For Goal Achievement: 08/21/22 Potential to Achieve Goals: Good    Frequency Min 4X/week     Co-evaluation PT/OT/SLP Co-Evaluation/Treatment: Yes Reason  for Co-Treatment: Complexity of the patient's impairments (multi-system involvement);Necessary to address cognition/behavior during  functional activity;For patient/therapist safety PT goals addressed during session: Mobility/safety with mobility;Balance;Proper use of DME;Strengthening/ROM OT goals addressed during session: ADL's and self-care       AM-PAC PT "6 Clicks" Mobility  Outcome Measure Help needed turning from your back to your side while in a flat bed without using bedrails?: A Little Help needed moving from lying on your back to sitting on the side of a flat bed without using bedrails?: A Little Help needed moving to and from a bed to a chair (including a wheelchair)?: A Lot Help needed standing up from a chair using your arms (e.g., wheelchair or bedside chair)?: A Lot Help needed to walk in hospital room?: A Lot Help needed climbing 3-5 steps with a railing? : A Lot 6 Click Score: 14    End of Session Equipment Utilized During Treatment: Gait belt Activity Tolerance: Patient tolerated treatment well Patient left: in chair;with call bell/phone within reach;with chair alarm set;with family/visitor present Nurse Communication: Mobility status PT Visit Diagnosis: Unsteadiness on feet (R26.81);Other abnormalities of gait and mobility (R26.89);Muscle weakness (generalized) (M62.81);Ataxic gait (R26.0)    Time: 7616-0737 PT Time Calculation (min) (ACUTE ONLY): 36 min   Charges:   PT Evaluation $PT Eval Moderate Complexity: 1 Mod          Vickki Muff, PT, DPT   Acute Rehabilitation Department  Ronnie Derby 08/07/2022, 11:55 AM

## 2022-08-07 NOTE — Progress Notes (Signed)
PT Cancellation Note  Patient Details Name: Dustin Blanchard MRN: [id number] DOB: 01-13-1951   Cancelled Treatment:    Reason Eval/Treat Not Completed: Active bedrest order remains this morning. PT will continue to follow and evaluate as appropriate.   Vickki Muff, PT, DPT   Acute Rehabilitation Department   Ronnie Derby 08/07/2022, 7:53 AM

## 2022-08-08 DIAGNOSIS — I4891 Unspecified atrial fibrillation: Secondary | ICD-10-CM | POA: Diagnosis not present

## 2022-08-08 DIAGNOSIS — I5042 Chronic combined systolic (congestive) and diastolic (congestive) heart failure: Secondary | ICD-10-CM | POA: Diagnosis not present

## 2022-08-08 DIAGNOSIS — I5043 Acute on chronic combined systolic (congestive) and diastolic (congestive) heart failure: Secondary | ICD-10-CM

## 2022-08-08 DIAGNOSIS — I255 Ischemic cardiomyopathy: Secondary | ICD-10-CM

## 2022-08-08 DIAGNOSIS — I63512 Cerebral infarction due to unspecified occlusion or stenosis of left middle cerebral artery: Secondary | ICD-10-CM | POA: Diagnosis not present

## 2022-08-08 DIAGNOSIS — I455 Other specified heart block: Secondary | ICD-10-CM

## 2022-08-08 DIAGNOSIS — I5041 Acute combined systolic (congestive) and diastolic (congestive) heart failure: Secondary | ICD-10-CM

## 2022-08-08 LAB — PHOSPHORUS
Phosphorus: 2.7 mg/dL (ref 2.5–4.6)
Phosphorus: 1.9 mg/dL — ABNORMAL LOW (ref 2.5–4.6)

## 2022-08-08 LAB — COMPREHENSIVE METABOLIC PANEL
ALT: 10 U/L (ref 0–44)
AST: 16 U/L (ref 15–41)
Albumin: 3 g/dL — ABNORMAL LOW (ref 3.5–5.0)
Alkaline Phosphatase: 53 U/L (ref 38–126)
Anion gap: 7 (ref 5–15)
BUN: 12 mg/dL (ref 8–23)
CO2: 23 mmol/L (ref 22–32)
Calcium: 8.7 mg/dL — ABNORMAL LOW (ref 8.9–10.3)
Chloride: 111 mmol/L (ref 98–111)
Creatinine, Ser: 0.78 mg/dL (ref 0.61–1.24)
GFR, Estimated: >60 mL/min (ref >60)
Glucose, Bld: 113 mg/dL — ABNORMAL HIGH (ref 70–99)
Potassium: 3.8 mmol/L (ref 3.5–5.1)
Sodium: 141 mmol/L (ref 135–145)
Total Bilirubin: 0.9 mg/dL (ref 0.3–1.2)
Total Protein: 5.2 g/dL — ABNORMAL LOW (ref 6.5–8.1)

## 2022-08-08 LAB — MAGNESIUM
Magnesium: 2.1 mg/dL (ref 1.7–2.4)
Magnesium: 2.2 mg/dL (ref 1.7–2.4)

## 2022-08-08 LAB — GLUCOSE, CAPILLARY
Glucose-Capillary: 104 mg/dL — ABNORMAL HIGH (ref 70–99)
Glucose-Capillary: 112 mg/dL — ABNORMAL HIGH (ref 70–99)
Glucose-Capillary: 118 mg/dL — ABNORMAL HIGH (ref 70–99)
Glucose-Capillary: 119 mg/dL — ABNORMAL HIGH (ref 70–99)
Glucose-Capillary: 132 mg/dL — ABNORMAL HIGH (ref 70–99)
Glucose-Capillary: 133 mg/dL — ABNORMAL HIGH (ref 70–99)

## 2022-08-08 MED ORDER — VALPROIC ACID 250 MG/5ML PO SOLN
250.0000 mg | Freq: Three times a day (TID) | ORAL | Status: DC
  Administered 2022-08-08 – 2022-08-12 (×11): 250 mg
  Filled 2022-08-08 (×13): qty 5

## 2022-08-08 NOTE — Plan of Care (Signed)
  Problem: Self-Care: Goal: Ability to participate in self-care as condition permits will improve Outcome: Progressing   Problem: Nutrition: Goal: Risk of aspiration will decrease Outcome: Progressing   Problem: Ischemic Stroke/TIA Tissue Perfusion: Goal: Complications of ischemic stroke/TIA will be minimized Outcome: Progressing   

## 2022-08-08 NOTE — Progress Notes (Addendum)
STROKE TEAM PROGRESS NOTE   ATTENDING NOTE: I reviewed above note and agree with the assessment and plan. Pt was seen and examined.   71 year old male with history of dementia, hypertension, hyperlipidemia, bradycardia, BPH admitted for found at home, urine incontinence.  CT showed no acute abnormality, old left occipital pole infarct.  Status post TNK.  CTA head and neck showed left M3 occlusion, bilateral siphon atherosclerosis with left moderate stenosis, left V4 stenosis.  CTP 0/123.  Status post IR with left M3 TICI2c reperfusion.  Repeat CT showed left frontal infarct without significant hemorrhage.  MRI showed left MCA patchy infarct with mild hemorrhagic conversion and a punctate right ACA infarct.  EF 35 to 40%.  LDL 71, A1c 4.6.  UDS negative.  Patient was found to have A-fib RVR, put on Cardizem drip, now off.  Creatinine 0.94-0.78.  Patient also agitated with sundowning, put on Depakote 250 3 times daily and cervical 25 nightly.  On exam, wife at bedside.  Patient lethargic but awake, alert, eyes open, global aphasia, not following all simple commands, no articulation, not able to mimic. No gaze palsy but seems to have left gaze preference, right gaze incomplete, tracking more on the left, blinking to visual threat more consistent on the left and occasionally on the right. Right facial droop. Tongue protrusion not cooperative. LUE 4/5 and RUE 3/5 proximal, distal muscle testing not cooperative. BLE 3/5 proximal and not cooperative with distal muscle testing. Sensation, coordination not cooperative and gait not tested.  Etiology for patient stroke likely embolic given A-fib RVR new diagnosis.  Cardiology consulted for A-fib, bradycardia and cardiomyopathy.  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  Defer cardiac ischemic evaluation for now, but consider GDMT based on BP goal.  Currently on aspirin 81, will consider anticoagulation with DOAC on Monday given moderate  size of stroke.  Added Lipitor 20.  Continue tube feeding for now.  And continue Depakote and Seroquel for agitation and sundowning management.  For detailed assessment and plan, please refer to above/below as I have made changes wherever appropriate.   Dustin Hawking, MD PhD Stroke Neurology 08/08/2022 6:58 PM  This patient is critically ill due to left MCA stroke, A-fib RVR, bradycardia tacky syndrome, cardiomyopathy, dysphagia and at significant risk of neurological worsening, death form recurrent stroke, hemorrhagic transformation, heart failure, cardiac arrest, sepsis. This patient's care requires constant monitoring of vital signs, hemodynamics, respiratory and cardiac monitoring, review of multiple databases, neurological assessment, discussion with family, other specialists and medical decision making of high complexity. I spent 40 minutes of neurocritical care time in the care of this patient. I had long discussion with wife at bedside, updated pt current condition, treatment plan and potential prognosis, and answered all the questions.  She expressed understanding and appreciation.      INTERVAL HISTORY His wife is at the bedside. Stable from neuro standpoint. Sundowning and agitation overnight. Failed MBS, Cortac now placed. Cardiology consulted for arrhythmias: Afib RVR and now SB 30s with 2-3 degree block.   Vitals:   08/08/22 0400 08/08/22 0500 08/08/22 0600 08/08/22 0800  BP: 111/79 114/78 121/87   Pulse: 77 63 76   Resp: 16 14 16    Temp: 98.2 F (36.8 C)   98.2 F (36.8 C)  TempSrc: Oral   Oral  SpO2: 97% 99% 98%   Weight:      Height:       CBC:  Recent Labs  Lab 08/06/22 0437  WBC 4.2  NEUTROABS 2.7  HGB 13.7  HCT 40.5  MCV 96.0  PLT 210    Basic Metabolic Panel:  Recent Labs  Lab 08/07/22 0241 08/07/22 1831 08/08/22 0449  NA 142  --  141  K 4.1  --  3.8  CL 114*  --  111  CO2 18*  --  23  GLUCOSE 91  --  113*  BUN 6*  --  12  CREATININE 0.94  --   0.78  CALCIUM 8.6*  --  8.7*  MG 2.1  --  2.1  PHOS  --  2.7 2.7    Lipid Panel:  Recent Labs  Lab 08/07/22 0241  CHOL 130  TRIG 60  HDL 47  CHOLHDL 2.8  VLDL 12  LDLCALC 71    HgbA1c:  Recent Labs  Lab 08/06/22 1037  HGBA1C 4.6*    Urine Drug Screen:  Recent Labs  Lab 08/06/22 0948  LABOPIA NONE DETECTED  COCAINSCRNUR NONE DETECTED  LABBENZ NONE DETECTED  AMPHETMU NONE DETECTED  THCU NONE DETECTED  LABBARB NONE DETECTED     Alcohol Level  Recent Labs  Lab 08/06/22 0437  ETH <10     IMAGING past 24 hours DG Swallowing Func-Speech Pathology  Result Date: 08/07/2022 Table formatting from the original result was not included. Objective Swallowing Evaluation: Type of Study: MBS-Modified Barium Swallow Study  Patient Details Name: Dustin Blanchard MRN: [id number] Date of Birth: 07-07-1951 Today's Date: 08/07/2022 Time: SLP Start Time (ACUTE ONLY): 1312 -SLP Stop Time (ACUTE ONLY): 1334 SLP Time Calculation (min) (ACUTE ONLY): 22 min Past Medical History: Past Medical History: Diagnosis Date  Dementia [facility])  Past Surgical History: Past Surgical History: Procedure Laterality Date  HERNIA REPAIR    HIP SURGERY    IR CT HEAD LTD  08/06/2022  IR CT HEAD LTD  08/06/2022  IR PERCUTANEOUS ART THROMBECTOMY/INFUSION INTRACRANIAL INC DIAG ANGIO  08/06/2022  IR US GUIDE VASC ACCESS RIGHT  08/06/2022  RADIOLOGY WITH ANESTHESIA N/A 08/06/2022  Procedure: IR WITH ANESTHESIA;  Surgeon: Julieanne Cotton, MD;  Location: [facility] OR;  Service: Radiology;  Laterality: N/A; HPI: Pt is a 71 y.o. male who presented with right-sided weakness and aphasia. CT head: Density overlying the left cerebral convexity is grossly similar  to the intra procedural images obtained earlier the same day and is  favored to reflect predominantly contrast extravasation; however,  superimposed subarachnoid hemorrhage can not be entirely excluded. Small focus of hypodensity in the left frontal lobe cortex suspicious for small  evolving infarct. PMH: hypertension, hyperlipidemia, sinus bradycardia, BPH, vascular dementia (dx in Dec, 2022 per family)  No data recorded  Recommendations for follow up therapy are one component of a multi-disciplinary discharge planning process, led by the attending physician.  Recommendations may be updated based on patient status, additional functional criteria and insurance authorization. Assessment / Plan / Recommendation   08/07/2022   1:12 PM Clinical Impressions Clinical Impression Pt presents with oropharyngeal dysphagia characterized by weak lingual manipulation, a pharyngeal delay, and reduction in bolus cohesion, tongue base retraction, anterior laryngeal movement, epiglottic inversion, and pharyngeal constriction. He exhibited premature spillage to the pyriform sinuses, vallecular residue, pyriform sinus residue, and reduced laryngeal closure. He demonstrated penetration (PAS 3, 5) and/or silent aspiration (PAS 8) across consistencies. Laryngeal invasion was secondary to the pharyngeal delay, reduced laryngeal closure, and residue in the pyriform sinuses. Episodes of aspiration intermittently triggered subtle throat clearing, but no instances resulted in coughing, and throat clearing was ineffective in expelling material. More  viscous boluses (i.e., thickened liquids/solids) reduced the immediacy of aspiration, but aspiration was still noted from residue. Multiple secondary swallows reduced pharyngeal residue, but not to a functional level. It is recommended that the pt's NPO status be maintained and that short-term non-oral alimentation (e.g., cortrak) be initiated. Pt may have limited (i.e., ~4-5/hour) individual ice chips after thorough oral care and SLP will follow for dysphagia treatment. SLP Visit Diagnosis Dysphagia, oropharyngeal phase (R13.12) Impact on safety and function Moderate aspiration risk;Severe aspiration risk     08/07/2022   1:12 PM Treatment Recommendations Treatment  Recommendations Therapy as outlined in treatment plan below     08/07/2022   1:12 PM Prognosis Prognosis for Safe Diet Advancement Good Barriers to Reach Goals Cognitive deficits;Language deficits;Severity of deficits   08/07/2022   1:12 PM Diet Recommendations SLP Diet Recommendations NPO;Ice chips PRN after oral care Medication Administration Via alternative means     08/07/2022   1:12 PM Other Recommendations Oral Care Recommendations Oral care QID;Staff/trained caregiver to provide oral care Follow Up Recommendations Acute inpatient rehab (3hours/day) Assistance recommended at discharge Frequent or constant Supervision/Assistance Functional Status Assessment Patient has had a recent decline in their functional status and demonstrates the ability to make significant improvements in function in a reasonable and predictable amount of time.   08/07/2022   1:12 PM Frequency and Duration  Speech Therapy Frequency (ACUTE ONLY) min 2x/week Treatment Duration 2 weeks     08/07/2022   1:12 PM Oral Phase Oral Phase Impaired Oral - Honey Teaspoon Lingual/palatal residue;Weak lingual manipulation;Delayed oral transit;Decreased bolus cohesion;Premature spillage Oral - Honey Cup Lingual/palatal residue;Weak lingual manipulation;Delayed oral transit;Decreased bolus cohesion;Premature spillage Oral - Nectar Cup Lingual/palatal residue;Weak lingual manipulation;Delayed oral transit;Decreased bolus cohesion;Premature spillage Oral - Nectar Straw Lingual/palatal residue;Weak lingual manipulation;Delayed oral transit;Premature spillage;Decreased bolus cohesion Oral - Thin Cup Lingual/palatal residue;Weak lingual manipulation;Right anterior bolus loss;Delayed oral transit;Decreased bolus cohesion;Premature spillage Oral - Thin Straw Lingual/palatal residue;Weak lingual manipulation;Right anterior bolus loss;Premature spillage;Decreased bolus cohesion;Delayed oral transit Oral - Puree Lingual/palatal residue;Weak lingual  manipulation;Decreased bolus cohesion;Premature spillage Oral - Mech Soft Lingual/palatal residue;Weak lingual manipulation;Premature spillage;Decreased bolus cohesion;Delayed oral transit;Impaired mastication    08/07/2022   1:12 PM Pharyngeal Phase Pharyngeal Phase Impaired Pharyngeal- Honey Teaspoon Reduced pharyngeal peristalsis;Reduced epiglottic inversion;Delayed swallow initiation-pyriform sinuses;Reduced anterior laryngeal mobility;Reduced airway/laryngeal closure;Reduced tongue base retraction;Penetration/Apiration after swallow;Trace aspiration;Pharyngeal residue - valleculae;Pharyngeal residue - pyriform;Pharyngeal residue - posterior pharnyx Pharyngeal Material enters airway, passes BELOW cords without attempt by patient to eject out (silent aspiration);Material enters airway, CONTACTS cords and not ejected out Pharyngeal- Honey Cup Reduced pharyngeal peristalsis;Reduced epiglottic inversion;Delayed swallow initiation-pyriform sinuses;Reduced anterior laryngeal mobility;Reduced airway/laryngeal closure;Reduced tongue base retraction;Penetration/Apiration after swallow;Trace aspiration;Pharyngeal residue - valleculae;Pharyngeal residue - pyriform;Pharyngeal residue - posterior pharnyx;Penetration/Aspiration during swallow Pharyngeal Material enters airway, remains ABOVE vocal cords and not ejected out;Material enters airway, CONTACTS cords and not ejected out;Material enters airway, passes BELOW cords without attempt by patient to eject out (silent aspiration) Pharyngeal- Nectar Cup Reduced pharyngeal peristalsis;Reduced epiglottic inversion;Delayed swallow initiation-pyriform sinuses;Reduced anterior laryngeal mobility;Reduced airway/laryngeal closure;Reduced tongue base retraction;Pharyngeal residue - valleculae;Pharyngeal residue - pyriform;Pharyngeal residue - posterior pharnyx;Penetration/Aspiration during swallow;Penetration/Apiration after swallow Pharyngeal Material enters airway, CONTACTS cords  and not ejected out;Material enters airway, passes BELOW cords without attempt by patient to eject out (silent aspiration) Pharyngeal- Nectar Straw Reduced pharyngeal peristalsis;Reduced epiglottic inversion;Delayed swallow initiation-pyriform sinuses;Reduced anterior laryngeal mobility;Reduced airway/laryngeal closure;Reduced tongue base retraction;Pharyngeal residue - valleculae;Pharyngeal residue - pyriform;Pharyngeal residue - posterior pharnyx;Penetration/Aspiration during swallow;Penetration/Apiration after swallow Pharyngeal Material enters airway, passes  BELOW cords without attempt by patient to eject out (silent aspiration) Pharyngeal- Thin Cup Reduced pharyngeal peristalsis;Reduced epiglottic inversion;Delayed swallow initiation-pyriform sinuses;Reduced anterior laryngeal mobility;Reduced airway/laryngeal closure;Reduced tongue base retraction;Pharyngeal residue - valleculae;Pharyngeal residue - pyriform;Pharyngeal residue - posterior pharnyx;Penetration/Aspiration during swallow;Penetration/Apiration after swallow;Penetration/Aspiration before swallow Pharyngeal Material enters airway, passes BELOW cords without attempt by patient to eject out (silent aspiration);Material enters airway, CONTACTS cords and not ejected out;Material enters airway, remains ABOVE vocal cords and not ejected out Pharyngeal- Thin Straw Reduced pharyngeal peristalsis;Reduced epiglottic inversion;Delayed swallow initiation-pyriform sinuses;Reduced anterior laryngeal mobility;Reduced airway/laryngeal closure;Reduced tongue base retraction;Pharyngeal residue - valleculae;Pharyngeal residue - pyriform;Pharyngeal residue - posterior pharnyx;Penetration/Aspiration during swallow;Penetration/Apiration after swallow Pharyngeal Material enters airway, passes BELOW cords without attempt by patient to eject out (silent aspiration);Material enters airway, CONTACTS cords and not ejected out Pharyngeal- Puree Reduced pharyngeal  peristalsis;Reduced epiglottic inversion;Delayed swallow initiation-pyriform sinuses;Reduced anterior laryngeal mobility;Reduced airway/laryngeal closure;Reduced tongue base retraction;Pharyngeal residue - valleculae;Pharyngeal residue - pyriform;Pharyngeal residue - posterior pharnyx;Penetration/Aspiration during swallow Pharyngeal Material enters airway, CONTACTS cords and then ejected out Pharyngeal- Mechanical Soft Reduced pharyngeal peristalsis;Reduced epiglottic inversion;Delayed swallow initiation-pyriform sinuses;Reduced anterior laryngeal mobility;Reduced airway/laryngeal closure;Reduced tongue base retraction;Pharyngeal residue - valleculae;Pharyngeal residue - pyriform;Pharyngeal residue - posterior pharnyx;Penetration/Aspiration during swallow;Penetration/Apiration after swallow Pharyngeal Material enters airway, remains ABOVE vocal cords and not ejected out    08/07/2022   1:12 PM Cervical Esophageal Phase  Cervical Esophageal Phase [facility] Shanika I. Hardin Negus, [place], [place] Office number [phone] Horton Marshall 08/07/2022, 3:46 PM                     DG Abd Portable 1V  Result Date: 08/07/2022 CLINICAL DATA:  NG tube placement. EXAM: PORTABLE ABDOMEN - 1 VIEW COMPARISON:  None Available. FINDINGS: Enteric tube is present as it courses through the stomach with tip over the right upper quadrant likely over the distal stomach or proximal duodenum. Visualized bowel gas pattern is nonobstructive. Contrast present within the large and small bowel. There is no free peritoneal air. Lung bases are clear. Mild cardiomegaly. Mild degenerative change of the spine. IMPRESSION: Enteric tube with tip over the right upper quadrant likely over the distal stomach or proximal duodenum. Electronically Signed   By: Marin Olp M.D.   On: 08/07/2022 15:17    PHYSICAL EXAM: General - critically ill, elderly black man in mild distress. Seen in bed with restraints on d/t difficult to  redirect d/t global aphasia HEENT- normocephalic, hoh  Cardiovascular - Regular rhythm and rate. Skin- warm, dry, intact  NEURO: Mental Status - Awake, alert, but poor attention. Globally aphasic, cannot follow verbal commands alone. With much coaching and mimicking, he can do some simple commands, but it is difficult to keep his attention to complete task. CN: PERRL. Gaze is conjugate, left preference. Cannot get to follow commands to fully test VF or EOM. Does track at times and able to cross midline. Right facial droop.  Motor/sensory: Right arm drifts down, able to better hold right leg, but still with drift. Withdraws to pain slower on the right, difficult to test fully d/t lack of communication. Gait and Station - deferred.  Baseline mRS: 3 for using a walker/cane, needing assistance with bills and some ADLs, but able to feed/dress self.   ASSESSMENT/PLAN Dustin Blanchard is a 71 y.o. male with a past medical history significant for dementia, hypertension, hyperlipidemia, sinus bradycardia, BPH. He presented on 8/17 during early am hours when his wife heard him on the floor in his urine.  He was treated with IV TNK and thrombectomy with TICI2c restoration of flow.   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 Leftward gaze deviation, raising the possibility of cortical ischemia in this setting. No Left MCA territory cortical infarct changes are identified. But there is a small age indeterminate Left PCA infarct at the left occipital pole. 2. No acute intracranial hemorrhage identified. ASPECTS 10. 3. Advanced bilateral white matter changes most commonly due to small vessel disease CTA head & neck with CT perfusion  1. CT Perfusion is positive for left MCA territory oligemia, proximally 120 mL. No infarct core detected by strict criteria, but abnormal CBV at the left  operculum. 2. No large vessel occlusion identified on CTA, but left M3 operculum branch occlusion(s) suspected. Still, given the CTP findings and presentation recommend discussion with Neurology for consideration of NIR intervention. 3. 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 Bilateral ICA siphon plaque with up to moderate Left supraclinoid stenosis. Moderate Left Vertebral V4 stenosis. No proximal Left PCA branch occlusion (small age indeterminate left occipital pole infarct on plain CT). Cerebral angio/Post IR CT  1. 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 2. Capillary hyperemia along patent anterior left parietal M3 branch with early venous drainage suggestive of ongoing ischemia. 3. Access to occluded branch with microwire resulted in recanalization (TICI 2C) with small contrast extravasation which resolved spontaneously. Delay flat panel CT and cerebral angiogram showed no further contrast extravasation. Repeat CT H  1. Density overlying the left cerebral convexity is grossly similar to the intra procedural images obtained earlier the same day and is favored to reflect predominantly contrast extravasation; however, superimposed subarachnoid hemorrhage can not be entirely excluded. 2. Small focus of hypodensity in the left frontal lobe cortex suspicious for small evolving infarct MRI brain: multifocal LMCA with small petechial hemorrhagic conversion noted. 2D Echo :35-40% EF, Severe LA dilation, Gr III diastolic dysfunction. Mild LVH,  LV global hypokinesis, No PFO/IAS LDL 71 HgbA1c 4.6 VTE prophylaxis - SCD's       Diet    Diet NPO time specified        No antithrombotic prior to admission, now on No antithrombotic.  Therapy recommendations:  SNF, but will likely need PEG Disposition:  pending   Hypertension Home meds:  none  Stable Did req Cleviprex gtt, now off Long-term BP goal normotensive   Hyperlipidemia Home meds:  atorvastatin LDL 71goal < 70 Continue statin at discharge     Other Stroke Risk Factors Advanced Age >/= 57    Other Active Problems Dementia  Sundowning- started Seroquel 25mg  with dinner and 12.5mg  PRN agitation Agitated delirium- now requiring restraints as he is puling at tubes/lines. Depakote for mood stability added Dysphagia - failed MBS, Cortac placed for meds/TF NEW Afib RVR- 12 lead EKG, started dilt gtt, now off since he subsequently went back to SB 30s, now with block. Cardiology has been consulted, I spoke to them this am and asked to see more urgently given progression of heart block.  Hospital day # 2  Desiree Metzger-Cihelka, ARNP-C, ANVP-BC Pager: [phone]    To contact Stroke Continuity provider, please refer to [phone]. After hours, contact General Neurology

## 2022-08-08 NOTE — Progress Notes (Signed)
Physical Therapy Treatment Patient Details Name: Dustin Blanchard MRN: [id number] DOB: 07/08/1951 Today's Date: 08/08/2022   History of Present Illness The pt is a 71 yo male presenting 8/17 after a fall with slurred speech and R-sided weakness with L gaze. 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 Hospital course complicated by new onset afib with RVR, bradycardia, awaiting EP consult. PMH: hypertension, hyperlipidemia, sinus bradycardia, BPH, vascular dementia (dx in Dec, 2022 per family)    PT Comments    The pt was agreeable to session, continues to present with calm, flat demeanor with frequent periods of tearfulness. The pt was able to complete bed mobility with minG and increased time and multiple sit-stand transfers with minA. He does need continued cues for use of RW, but was able to progress distance with assist for balance and navigating in room and around obstacles. Max cues for attention to midline and R. Continue to recommend acute inpatient rehab when medically stable for d/c.    Recommendations for follow up therapy are one component of a multi-disciplinary discharge planning process, led by the attending physician.  Recommendations may be updated based on patient status, additional functional criteria and insurance authorization.  Follow Up Recommendations  Acute inpatient rehab (3hours/day)     Assistance Recommended at Discharge Frequent or constant Supervision/Assistance  Patient can return home with the following A lot of help with walking and/or transfers;A lot of help with bathing/dressing/bathroom;Assistance with cooking/housework;Assistance with feeding;Assist for transportation;Direct supervision/assist for financial management;Direct supervision/assist for medications management;Help with stairs  or ramp for entrance   Equipment Recommendations   (defer to post acute)    Recommendations for Other Services Rehab consult     Precautions / Restrictions Precautions Precautions: Fall Restrictions Weight Bearing Restrictions: No Other Position/Activity Restrictions: SBP 120-150     Mobility  Bed Mobility Overal bed mobility: Needs Assistance Bed Mobility: Supine to Sit     Supine to sit: Min guard     General bed mobility comments: minG with use of LUE on bed rail, able to scoot when cued    Transfers Overall transfer level: Needs assistance Equipment used: Rolling walker (2 wheels) Transfers: Sit to/from Stand Sit to Stand: Min assist           General transfer comment: minA to rise and steady, assist to guide hands to RW and mod cues to maintain hands on RW    Ambulation/Gait Ambulation/Gait assistance: Min assist, +2 safety/equipment Gait Distance (Feet): 15 Feet (+ 45 ft) Assistive device: Rolling walker (2 wheels) Gait Pattern/deviations: Step-through pattern, Narrow base of support, Shuffle, Drifts right/left Gait velocity: decreased Gait velocity interpretation: <1.31 ft/sec, indicative of household ambulator   General Gait Details: pt with no knee buckling, poor attention to R, needing cues to maintain hands on RW. gaze to L despite max cues to attend to midline through session.   Modified Rankin (Stroke Patients Only) Modified Rankin (Stroke Patients Only) Pre-Morbid Rankin Score: No symptoms Modified Rankin: Moderately severe disability     Balance Overall balance assessment: Needs assistance Sitting-balance support: Feet supported Sitting balance-Leahy Scale: Fair     Standing balance support: During functional activity, Bilateral upper extremity supported Standing balance-Leahy Scale: Poor Standing balance comment: BUE support for static and dynamic gait  Cognition Arousal/Alertness:  Awake/alert Behavior During Therapy: Impulsive (emotional; crying at times - appears due ot his awareness/frustration regarding his deficits) Overall Cognitive Status: Difficult to assess                                 General Comments: appears apraxic; following commands with gestural cues at times; able to complete some tasks without cues, needs max cues to attend to midline and minimal attention to R           General Comments General comments (skin integrity, edema, etc.): pt tearful through session, no family present. SpO2 stable on RA      Pertinent Vitals/Pain Pain Assessment Faces Pain Scale: No hurt     PT Goals (current goals can now be found in the care plan section) Acute Rehab PT Goals Patient Stated Goal: rehab and return home PT Goal Formulation: With patient Time For Goal Achievement: 08/21/22 Potential to Achieve Goals: Good Progress towards PT goals: Progressing toward goals    Frequency    Min 4X/week      PT Plan Current plan remains appropriate       AM-PAC PT "6 Clicks" Mobility   Outcome Measure  Help needed turning from your back to your side while in a flat bed without using bedrails?: A Little Help needed moving from lying on your back to sitting on the side of a flat bed without using bedrails?: A Little Help needed moving to and from a bed to a chair (including a wheelchair)?: A Lot Help needed standing up from a chair using your arms (e.g., wheelchair or bedside chair)?: A Lot Help needed to walk in hospital room?: A Lot Help needed climbing 3-5 steps with a railing? : A Lot 6 Click Score: 14    End of Session Equipment Utilized During Treatment: Gait belt Activity Tolerance: Patient tolerated treatment well Patient left: with call bell/phone within reach;in bed;with nursing/sitter in room (with RN getting bath) Nurse Communication: Mobility status PT Visit Diagnosis: Unsteadiness on feet (R26.81);Other abnormalities  of gait and mobility (R26.89);Muscle weakness (generalized) (M62.81);Ataxic gait (R26.0)     Time: [id number] PT Time Calculation (min) (ACUTE ONLY): 25 min  Charges:  $Gait Training: 8-22 mins $Therapeutic Activity: 8-22 mins                     Vickki Muff, PT, DPT   Acute Rehabilitation Department   Ronnie Derby 08/08/2022, 6:18 PM

## 2022-08-08 NOTE — Consult Note (Addendum)
Cardiology Consultation:   Patient ID: Dustin Blanchard MRN: [id number]; DOB: 1951-12-13  Admit date: 08/06/2022 Date of Consult: 08/08/2022  PCP:  [facility], [facility] Providers Cardiologist:  Donato Heinz, MD new  Patient Profile:   Dustin Blanchard is a 71 y.o. male with a hx of HTN, HLD, sinus bradycardia, and BPH who is being seen 08/08/2022 for the evaluation of newly recognized Afib with RVR, bradycardia, and CHF at the request of Dr. Leonie Man.  History of Present Illness:   Dustin Blanchard was seen by Dr. Ubaldo Glassing with [facility] in 2018 after pericardial effusion seen on CT scan. Echocardiogram at [facility] reported very small pericardial effusion with no signs of tamponade. He was noted to have baseline sinus bradycardia that was asymptomatic at that time. Repeat echo showed persistent small pericardial effusion. It does not appear that he has been seen by cards since 2019.   Of note, echo in 2019 did show LVEF 45%. It does not appear this was addressed by cardiology. Home med list does not include GDMT for mild cardiomyopathy.   He presented to [facility] as code STROKE after suffering a fall in the middle of the night followed by right sided weakness and right facial droop with leftward gaze. 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 Pt has global aphasia and persistent right sided weakness and leftward gaze. Imaging with possible SAH vs contrast extravasation.   Echocardiogram revealed LVEF 35-40% with G3 DD, mild LVH, low normal RV function, elevated PASP, severely dilated left atrium, moderate pericardial effusion without evidence of tamponade, and moderate MR.   Pt converted to Afib RVR 08/07/22 with rates in the   He was started on cardizem gtt but then had bradycardia in the 30s. Cardizem gtt has been off since 6:00 pm yesterday. 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   Pt is awake and answers simple yes/no  questions. Wife at bedside helps with history. He does have mild vascular dementia, per his wife, but is very active at baseline. He push mows and weed-eats the yard, walks the grocery store and can do moderate house work. He has some confusion at times, but is generally functional. He denies chest pain and palpitations leading up to his stroke. He has never been diagnosed with an arrhythmia, had MI, or PCI.   No family history of heart disease or sudden cardiac death. He has a smoking history, but only socially. He has not smoked or consumed alcohol for the last 10 years. They live in [place] and have three children, one son lives nearby. He is a retired Furniture conservator/restorer. He suffered an accident at work requiring ?pinning in hip in ?1994.    Past Medical History:  Diagnosis Date   Dementia [facility])     Past Surgical History:  Procedure Laterality Date   HERNIA REPAIR     HIP SURGERY     IR CT HEAD LTD  08/06/2022   IR CT HEAD LTD  08/06/2022   IR PERCUTANEOUS ART THROMBECTOMY/INFUSION INTRACRANIAL INC DIAG ANGIO  08/06/2022   IR US GUIDE VASC ACCESS RIGHT  08/06/2022   RADIOLOGY WITH ANESTHESIA N/A 08/06/2022   Procedure: IR WITH ANESTHESIA;  Surgeon: Luanne Bras, MD;  Location: [place];  Service: Radiology;  Laterality: N/A;     Home Medications:  Prior to Admission medications   Medication Sig Start Date End Date Taking? Authorizing Provider  acetaminophen (TYLENOL) 500 MG tablet Take 500 mg by mouth as needed (  pain).   Yes [provider]  atorvastatin (LIPITOR) 20 MG tablet Take 20 mg by mouth daily. 05/24/22  Yes [provider]  Cholecalciferol (VITAMIN D3) 125 MCG (5000 UT) CAPS Take 5,000 Units by mouth daily.   Yes [provider]  ibuprofen (ADVIL) 200 MG tablet Take 200 mg by mouth as needed (pain).   Yes [provider]  polyethylene glycol (MIRALAX / GLYCOLAX) 17 g packet Take 17 g by mouth daily as needed for mild constipation.   Yes [provider]  tamsulosin (FLOMAX) 0.4 MG CAPS capsule Take 0.4 mg by mouth daily. 07/27/22  Yes [provider]    Inpatient Medications: Scheduled Meds:  aspirin  300 mg Rectal Daily   Or   aspirin  81 mg Per Tube Daily   atorvastatin  20 mg Per Tube Daily   Chlorhexidine Gluconate Cloth  6 each Topical Daily   enoxaparin (LOVENOX) injection  40 mg Subcutaneous Q24H   feeding supplement (PROSource TF20)  60 mL Per Tube BID   labetalol  20 mg Intravenous Once   multivitamin with minerals  1 tablet Per Tube Daily   pantoprazole (PROTONIX) IV  40 mg Intravenous QHS   QUEtiapine  25 mg Per Tube Q supper   valproic acid  250 mg Per Tube TID AC   Continuous Infusions:  sodium chloride 50 mL/hr at 08/08/22 0800   clevidipine Stopped (08/06/22 1941)   diltiazem (CARDIZEM) infusion Stopped (08/07/22 1754)   feeding supplement (OSMOLITE 1.5 CAL) 30 mL/hr at 08/08/22 0800   PRN Meds: acetaminophen **OR** acetaminophen (TYLENOL) oral liquid 160 mg/5 mL **OR** acetaminophen, mouth rinse, QUEtiapine, senna-docusate  Allergies:   No Known Allergies  Social History:   Social History   Socioeconomic History   Marital status: Married    Spouse name: Not on file   Number of children: Not on file   Years of education: Not on file   Highest education level: Not on file  Occupational History   Not on file  Tobacco Use   Smoking status: Former   Smokeless tobacco: Never  Substance and Sexual Activity   Alcohol use: No   Drug use: Not on file   Sexual activity: Never  Other Topics Concern   Not on file  Social History Narrative   Not on file   Social Determinants of Health   Financial Resource Strain: Not on file  Food Insecurity: Not on file  Transportation Needs: Not on file  Physical Activity: Not on file  Stress: Not on file  Social Connections: Not on file  Intimate Partner Violence: Not on file    Family History:   No family history on file.   ROS:  Please  see the history of present illness.   All other ROS reviewed and negative.     Physical Exam/Data:   Vitals:   08/08/22 0700 08/08/22 0800 08/08/22 0900 08/08/22 1000  BP: 116/89 127/85 122/76 131/83  Pulse: 65 65 (!) 46 67  Resp: _0 Temp:  98.2 F (36.8 C)    TempSrc:  Oral    SpO2: 100% 100% 100% 99%  Weight:      Height:        Intake/Output Summary (Last 24 hours) at 08/08/2022 1057 Last data filed at 08/08/2022 0800 Gross per 24 hour  Intake 1296.99 ml  Output 850 ml  Net 446.99 ml      08/08/2022    3:33 AM 08/06/2022  4:38 AM 09/07/2016    8:37 PM  Last 3 Weights  Weight (lbs) 145 lb 8.1 oz 158 lb 11.7 oz 155 lb  Weight (kg) 66 kg 72 kg 70.308 kg     Body mass index is 19.73 kg/m.  General:  resting in bed, awake, mittens in place HEENT: normal Neck: no JVD Cardiac:  normal S1, S2; RRR; no murmur  Lungs:  clear to auscultation bilaterally, no wheezing, rhonchi or rales  Abd: soft, nontender, no hepatomegaly  Ext: no edema, SCDs Skin: warm and dry  Neuro:  can move all extremities, is abel to answer yes/no questions, walked with PT down the hall Psych:  Normal affect   EKG:  The EKG was personally reviewed and demonstrates:  SR HR 62, first degree HB, PVCs, septal Q waves Telemetry:  Telemetry was personally reviewed and demonstrates:  extensively reviewed: Sinus rhythm - sinus bradycardia with first degree heart block 40-60s At times, junctional rhythm with retrograde p waves Coarse atrial fibrillation conversion to junctional rhythm with post-conversion pauses of ups to 4.27 sec, Afib pauses of 2.7 sec. Sinus pauses up to 4.5 sec Episodes of narrow complex tachycardia with regular rhythm suggesting possible SVT   Relevant CV Studies:  Echo 08/06/22: 1. Coarse trabeculation of the apex without obvious thrombus. Left  ventricular ejection fraction, by estimation, is 35 to 40%. Left  ventricular ejection fraction by 2D MOD biplane is 37.1 %.  The left  ventricle has moderately decreased function. The left   ventricle demonstrates global hypokinesis. There is mild left ventricular  hypertrophy. Left ventricular diastolic parameters are consistent with  Grade III diastolic dysfunction (restrictive). Elevated left ventricular  end-diastolic pressure.   2. Right ventricular systolic function is low normal. The right  ventricular size is normal. There is mildly elevated pulmonary artery  systolic pressure. The estimated right ventricular systolic pressure is  40.9 mmHg.   3. Left atrial size was severely dilated.   4. Moderate pericardial effusion. The pericardial effusion is  circumferential. There is no evidence of cardiac tamponade.   5. The mitral valve is abnormal. Moderate mitral valve regurgitation.   6. The aortic valve is tricuspid. Aortic valve regurgitation is trivial.   7. The inferior vena cava is normal in size with greater than 50%  respiratory variability, suggesting right atrial pressure of 3 mmHg.   Laboratory Data:  High Sensitivity Troponin:  No results for input(s): "TROPONINIHS" in the last 720 hours.   Chemistry Recent Labs  Lab 08/06/22 0437 08/07/22 0241 08/08/22 0449  NA 143 142 141  K 4.3 4.1 3.8  CL 109 114* 111  CO2 27 18* 23  GLUCOSE 114* 91 113*  BUN 10 6* 12  CREATININE 1.00 0.94 0.78  CALCIUM 9.4 8.6* 8.7*  MG  --  2.1 2.1  GFRNONAA >60 >60 >60  ANIONGAP _0 Recent Labs  Lab 08/06/22 0437 08/07/22 0241 08/08/22 0449  PROT 6.9 5.7* 5.2*  ALBUMIN 3.9 3.2* 3.0*  AST 14* 22 16  ALT _1 ALKPHOS 66 58 53  BILITOT 0.7 0.8 0.9   Lipids  Recent Labs  Lab 08/07/22 0241  CHOL 130  TRIG 60  HDL 47  LDLCALC 71  CHOLHDL 2.8    Hematology Recent Labs  Lab 08/06/22 0437  WBC 4.2  RBC 4.22  HGB 13.7  HCT 40.5  MCV 96.0  MCH 32.5  MCHC 33.8  RDW 12.3  PLT 210   Thyroid  No results for input(s): "TSH", "FREET4" in the last 168 hours.  BNPNo results for  input(s): "BNP", "PROBNP" in the last 168 hours.  DDimer No results for input(s): "DDIMER" in the last 168 hours.   Radiology/Studies:  DG Swallowing Func-Speech Pathology  Result Date: 08/07/2022 Table formatting from the original result was not included. Objective Swallowing Evaluation: Type of Study: MBS-Modified Barium Swallow Study  Patient Details Name: Dustin Blanchard MRN: [id number] Date of Birth: 1951/08/20 Today's Date: 08/07/2022 Time: SLP Start Time (ACUTE ONLY): 1312 -SLP Stop Time (ACUTE ONLY): 1334 SLP Time Calculation (min) (ACUTE ONLY): 22 min Past Medical History: Past Medical History: Diagnosis Date  Dementia [facility])  Past Surgical History: Past Surgical History: Procedure Laterality Date  HERNIA REPAIR    HIP SURGERY    IR CT HEAD LTD  08/06/2022  IR CT HEAD LTD  08/06/2022  IR PERCUTANEOUS ART THROMBECTOMY/INFUSION INTRACRANIAL INC DIAG ANGIO  08/06/2022  IR US GUIDE VASC ACCESS RIGHT  08/06/2022  RADIOLOGY WITH ANESTHESIA N/A 08/06/2022  Procedure: IR WITH ANESTHESIA;  Surgeon: Luanne Bras, MD;  Location: [place];  Service: Radiology;  Laterality: N/A; HPI: Pt is a 71 y.o. male who presented with right-sided weakness and aphasia. CT head: Density overlying the left cerebral convexity is grossly similar  to the intra procedural images obtained earlier the same day and is  favored to reflect predominantly contrast extravasation; however,  superimposed subarachnoid hemorrhage can not be entirely excluded. Small focus of hypodensity in the left frontal lobe cortex suspicious for small evolving infarct. PMH: hypertension, hyperlipidemia, sinus bradycardia, BPH, vascular dementia (dx in Dec, 2022 per family)  No data recorded  Recommendations for follow up therapy are one component of a multi-disciplinary discharge planning process, led by the attending physician.  Recommendations may be updated based on patient status, additional functional criteria and insurance authorization. Assessment / Plan /  Recommendation   08/07/2022   1:12 PM Clinical Impressions Clinical Impression Pt presents with oropharyngeal dysphagia characterized by weak lingual manipulation, a pharyngeal delay, and reduction in bolus cohesion, tongue base retraction, anterior laryngeal movement, epiglottic inversion, and pharyngeal constriction. He exhibited premature spillage to the pyriform sinuses, vallecular residue, pyriform sinus residue, and reduced laryngeal closure. He demonstrated penetration (PAS 3, 5) and/or silent aspiration (PAS 8) across consistencies. Laryngeal invasion was secondary to the pharyngeal delay, reduced laryngeal closure, and residue in the pyriform sinuses. Episodes of aspiration intermittently triggered subtle throat clearing, but no instances resulted in coughing, and throat clearing was ineffective in expelling material. More viscous boluses (i.e., thickened liquids/solids) reduced the immediacy of aspiration, but aspiration was still noted from residue. Multiple secondary swallows reduced pharyngeal residue, but not to a functional level. It is recommended that the pt's NPO status be maintained and that short-term non-oral alimentation (e.g., cortrak) be initiated. Pt may have limited (i.e., ~4-5/hour) individual ice chips after thorough oral care and SLP will follow for dysphagia treatment. SLP Visit Diagnosis Dysphagia, oropharyngeal phase (R13.12) Impact on safety and function Moderate aspiration risk;Severe aspiration risk     08/07/2022   1:12 PM Treatment Recommendations Treatment Recommendations Therapy as outlined in treatment plan below     08/07/2022   1:12 PM Prognosis Prognosis for Safe Diet Advancement Good Barriers to Reach Goals Cognitive deficits;Language deficits;Severity of deficits   08/07/2022   1:12 PM Diet Recommendations SLP Diet Recommendations NPO;Ice chips PRN after oral care Medication Administration Via alternative means     08/07/2022   1:12 PM Other Recommendations Oral Care  Recommendations Oral care QID;Staff/trained caregiver to provide oral care Follow Up Recommendations Acute inpatient rehab (3hours/day) Assistance recommended at discharge Frequent or constant Supervision/Assistance Functional Status Assessment Patient has had a recent decline in their functional status and demonstrates the ability to make significant improvements in function in a reasonable and predictable amount of time.   08/07/2022   1:12 PM Frequency and Duration  Speech Therapy Frequency (ACUTE ONLY) min 2x/week Treatment Duration 2 weeks     08/07/2022   1:12 PM Oral Phase Oral Phase Impaired Oral - Honey Teaspoon Lingual/palatal residue;Weak lingual manipulation;Delayed oral transit;Decreased bolus cohesion;Premature spillage Oral - Honey Cup Lingual/palatal residue;Weak lingual manipulation;Delayed oral transit;Decreased bolus cohesion;Premature spillage Oral - Nectar Cup Lingual/palatal residue;Weak lingual manipulation;Delayed oral transit;Decreased bolus cohesion;Premature spillage Oral - Nectar Straw Lingual/palatal residue;Weak lingual manipulation;Delayed oral transit;Premature spillage;Decreased bolus cohesion Oral - Thin Cup Lingual/palatal residue;Weak lingual manipulation;Right anterior bolus loss;Delayed oral transit;Decreased bolus cohesion;Premature spillage Oral - Thin Straw Lingual/palatal residue;Weak lingual manipulation;Right anterior bolus loss;Premature spillage;Decreased bolus cohesion;Delayed oral transit Oral - Puree Lingual/palatal residue;Weak lingual manipulation;Decreased bolus cohesion;Premature spillage Oral - Mech Soft Lingual/palatal residue;Weak lingual manipulation;Premature spillage;Decreased bolus cohesion;Delayed oral transit;Impaired mastication    08/07/2022   1:12 PM Pharyngeal Phase Pharyngeal Phase Impaired Pharyngeal- Honey Teaspoon Reduced pharyngeal peristalsis;Reduced epiglottic inversion;Delayed swallow initiation-pyriform sinuses;Reduced anterior laryngeal  mobility;Reduced airway/laryngeal closure;Reduced tongue base retraction;Penetration/Apiration after swallow;Trace aspiration;Pharyngeal residue - valleculae;Pharyngeal residue - pyriform;Pharyngeal residue - posterior pharnyx Pharyngeal Material enters airway, passes BELOW cords without attempt by patient to eject out (silent aspiration);Material enters airway, CONTACTS cords and not ejected out Pharyngeal- Honey Cup Reduced pharyngeal peristalsis;Reduced epiglottic inversion;Delayed swallow initiation-pyriform sinuses;Reduced anterior laryngeal mobility;Reduced airway/laryngeal closure;Reduced tongue base retraction;Penetration/Apiration after swallow;Trace aspiration;Pharyngeal residue - valleculae;Pharyngeal residue - pyriform;Pharyngeal residue - posterior pharnyx;Penetration/Aspiration during swallow Pharyngeal Material enters airway, remains ABOVE vocal cords and not ejected out;Material enters airway, CONTACTS cords and not ejected out;Material enters airway, passes BELOW cords without attempt by patient to eject out (silent aspiration) Pharyngeal- Nectar Cup Reduced pharyngeal peristalsis;Reduced epiglottic inversion;Delayed swallow initiation-pyriform sinuses;Reduced anterior laryngeal mobility;Reduced airway/laryngeal closure;Reduced tongue base retraction;Pharyngeal residue - valleculae;Pharyngeal residue - pyriform;Pharyngeal residue - posterior pharnyx;Penetration/Aspiration during swallow;Penetration/Apiration after swallow Pharyngeal Material enters airway, CONTACTS cords and not ejected out;Material enters airway, passes BELOW cords without attempt by patient to eject out (silent aspiration) Pharyngeal- Nectar Straw Reduced pharyngeal peristalsis;Reduced epiglottic inversion;Delayed swallow initiation-pyriform sinuses;Reduced anterior laryngeal mobility;Reduced airway/laryngeal closure;Reduced tongue base retraction;Pharyngeal residue - valleculae;Pharyngeal residue - pyriform;Pharyngeal residue -  posterior pharnyx;Penetration/Aspiration during swallow;Penetration/Apiration after swallow Pharyngeal Material enters airway, passes BELOW cords without attempt by patient to eject out (silent aspiration) Pharyngeal- Thin Cup Reduced pharyngeal peristalsis;Reduced epiglottic inversion;Delayed swallow initiation-pyriform sinuses;Reduced anterior laryngeal mobility;Reduced airway/laryngeal closure;Reduced tongue base retraction;Pharyngeal residue - valleculae;Pharyngeal residue - pyriform;Pharyngeal residue - posterior pharnyx;Penetration/Aspiration during swallow;Penetration/Apiration after swallow;Penetration/Aspiration before swallow Pharyngeal Material enters airway, passes BELOW cords without attempt by patient to eject out (silent aspiration);Material enters airway, CONTACTS cords and not ejected out;Material enters airway, remains ABOVE vocal cords and not ejected out Pharyngeal- Thin Straw Reduced pharyngeal peristalsis;Reduced epiglottic inversion;Delayed swallow initiation-pyriform sinuses;Reduced anterior laryngeal mobility;Reduced airway/laryngeal closure;Reduced tongue base retraction;Pharyngeal residue - valleculae;Pharyngeal residue - pyriform;Pharyngeal residue - posterior pharnyx;Penetration/Aspiration during swallow;Penetration/Apiration after swallow Pharyngeal Material enters airway, passes BELOW cords without attempt by patient to eject out (silent aspiration);Material enters airway, CONTACTS cords and not ejected out Pharyngeal- Puree Reduced pharyngeal peristalsis;Reduced epiglottic inversion;Delayed swallow initiation-pyriform sinuses;Reduced anterior laryngeal mobility;Reduced airway/laryngeal closure;Reduced tongue base retraction;Pharyngeal residue - valleculae;Pharyngeal residue - pyriform;Pharyngeal residue - posterior pharnyx;Penetration/Aspiration during swallow  Pharyngeal Material enters airway, CONTACTS cords and then ejected out Pharyngeal- Mechanical Soft Reduced pharyngeal  peristalsis;Reduced epiglottic inversion;Delayed swallow initiation-pyriform sinuses;Reduced anterior laryngeal mobility;Reduced airway/laryngeal closure;Reduced tongue base retraction;Pharyngeal residue - valleculae;Pharyngeal residue - pyriform;Pharyngeal residue - posterior pharnyx;Penetration/Aspiration during swallow;Penetration/Apiration after swallow Pharyngeal Material enters airway, remains ABOVE vocal cords and not ejected out    08/07/2022   1:12 PM Cervical Esophageal Phase  Cervical Esophageal Phase [facility] Shanika I. Hardin Negus, [place], [place] Office number [phone] Horton Marshall 08/07/2022, 3:46 PM                     DG Abd Portable 1V  Result Date: 08/07/2022 CLINICAL DATA:  NG tube placement. EXAM: PORTABLE ABDOMEN - 1 VIEW COMPARISON:  None Available. FINDINGS: Enteric tube is present as it courses through the stomach with tip over the right upper quadrant likely over the distal stomach or proximal duodenum. Visualized bowel gas pattern is nonobstructive. Contrast present within the large and small bowel. There is no free peritoneal air. Lung bases are clear. Mild cardiomegaly. Mild degenerative change of the spine. IMPRESSION: Enteric tube with tip over the right upper quadrant likely over the distal stomach or proximal duodenum. Electronically Signed   By: Marin Olp M.D.   On: 08/07/2022 15:17   MR BRAIN WO CONTRAST  Result Date: 08/06/2022 CLINICAL DATA:  Stroke follow-up EXAM: MRI HEAD WITHOUT CONTRAST TECHNIQUE: Multiplanar, multiecho pulse sequences of the brain and surrounding structures were obtained without intravenous contrast. COMPARISON:  None Available. FINDINGS: Brain: Multifocal acute/subacute ischemia in the left MCA territory, predominantly involving the left frontal operculum and posterior temporal lobe. There is a punctate focus of abnormal diffusion restriction within the paramedian right frontal lobe that is likely a susceptibility  artifact related to falcine calcification. There is susceptibility effects in the left MCA territory from likely extravasated contrast. There is multifocal hyperintense T2-weighted signal within the white matter. Parenchymal volume and CSF spaces are normal. Small volume hemorrhage at the left external capsule (series 6, image 19), which appears to be old. Vascular: Major flow voids are preserved. Skull and upper cervical spine: Normal calvarium and skull base. Visualized upper cervical spine and soft tissues are normal. Sinuses/Orbits:No paranasal sinus fluid levels or advanced mucosal thickening. No mastoid or middle ear effusion. Normal orbits. IMPRESSION: 1. Multifocal acute/subacute ischemia in the left MCA territory, predominantly involving the left frontal operculum and posterior temporal lobe. 2. Small volume hemorrhage at the left external capsule, favored to be old. Electronically Signed   By: Ulyses Jarred M.D.   On: 08/06/2022 18:54   ECHOCARDIOGRAM COMPLETE  Result Date: 08/06/2022    ECHOCARDIOGRAM REPORT   Patient Name:   Dustin Blanchard Date of Exam: 08/06/2022 Medical Rec #:  [id number]      Height:       72.0 in Accession #:    [id number]     Weight:       158.7 lb Date of Birth:  [birth year]       BSA:          1.931 m Patient Age:    69 years       BP:           136/90 mmHg Patient Gender: M              HR:           81 bpm. Exam Location:  Inpatient Procedure: 2D Echo, Cardiac Doppler and Color Doppler Indications:  Stroke  History:        Patient has no prior history of Echocardiogram examinations.                 Acute ischemic left MCA stroke ([place]), Dementia.  Sonographer:    Alvino Chapel RCS Referring Phys: [id number] [place]  1. Coarse trabeculation of the apex without obvious thrombus. Left ventricular ejection fraction, by estimation, is 35 to 40%. Left ventricular ejection fraction by 2D MOD biplane is 37.1 %. The left ventricle has moderately decreased function. The  left  ventricle demonstrates global hypokinesis. There is mild left ventricular hypertrophy. Left ventricular diastolic parameters are consistent with Grade III diastolic dysfunction (restrictive). Elevated left ventricular end-diastolic pressure.  2. Right ventricular systolic function is low normal. The right ventricular size is normal. There is mildly elevated pulmonary artery systolic pressure. The estimated right ventricular systolic pressure is 33.2 mmHg.  3. Left atrial size was severely dilated.  4. Moderate pericardial effusion. The pericardial effusion is circumferential. There is no evidence of cardiac tamponade.  5. The mitral valve is abnormal. Moderate mitral valve regurgitation.  6. The aortic valve is tricuspid. Aortic valve regurgitation is trivial.  7. The inferior vena cava is normal in size with greater than 50% respiratory variability, suggesting right atrial pressure of 3 mmHg. Comparison(s): No prior Echocardiogram. FINDINGS  Left Ventricle: Coarse trabeculation of the apex without obvious thrombus. Left ventricular ejection fraction, by estimation, is 35 to 40%. Left ventricular ejection fraction by 2D MOD biplane is 37.1 %. The left ventricle has moderately decreased function. The left ventricle demonstrates global hypokinesis. The left ventricular internal cavity size was normal in size. There is mild left ventricular hypertrophy. Left ventricular diastolic parameters are consistent with Grade III diastolic dysfunction (restrictive). Elevated left ventricular end-diastolic pressure. Right Ventricle: The right ventricular size is normal. No increase in right ventricular wall thickness. Right ventricular systolic function is low normal. There is mildly elevated pulmonary artery systolic pressure. The tricuspid regurgitant velocity is 3.21 m/s, and with an assumed right atrial pressure of 3 mmHg, the estimated right ventricular systolic pressure is 95.1 mmHg. Left Atrium: Left atrial size was  severely dilated. Right Atrium: Right atrial size was normal in size. Pericardium: A moderately sized pericardial effusion is present. The pericardial effusion is circumferential. There is diastolic collapse of the right atrial wall and diastolic collapse of the right ventricular free wall. There is no evidence of cardiac tamponade. Mitral Valve: The mitral valve is abnormal. There is mild thickening of the anterior and posterior mitral valve leaflet(s). Moderate mitral valve regurgitation. Tricuspid Valve: The tricuspid valve is grossly normal. Tricuspid valve regurgitation is mild. Aortic Valve: The aortic valve is tricuspid. Aortic valve regurgitation is trivial. Pulmonic Valve: The pulmonic valve was grossly normal. Pulmonic valve regurgitation is trivial. Aorta: The aortic root and ascending aorta are structurally normal, with no evidence of dilitation. Venous: The inferior vena cava is normal in size with greater than 50% respiratory variability, suggesting right atrial pressure of 3 mmHg. IAS/Shunts: No atrial level shunt detected by color flow Doppler.  LEFT VENTRICLE PLAX 2D                        Biplane EF (MOD) LVIDd:         5.40 cm         LV Biplane EF:   Left LVIDs:         3.90 cm  ventricular LV PW:         1.30 cm                          ejection LV IVS:        1.10 cm                          fraction by LVOT diam:     2.00 cm                          2D MOD LV SV:         47                               biplane is LV SV Index:   24                               37.1 %. LVOT Area:     3.14 cm                                Diastology                                LV e' medial:    5.98 cm/s LV Volumes (MOD)               LV E/e' medial:  15.6 LV vol d, MOD    125.5 ml      LV e' lateral:   8.49 cm/s A2C:                           LV E/e' lateral: 11.0 LV vol d, MOD    140.8 ml A4C: LV vol s, MOD    76.4 ml A2C: LV vol s, MOD    88.4 ml A4C: LV SV MOD A2C:   49.1 ml LV  SV MOD A4C:   140.8 ml LV SV MOD BP:    50.3 ml RIGHT VENTRICLE RV S prime:     10.30 cm/s TAPSE (M-mode): 1.6 cm LEFT ATRIUM              Index        RIGHT ATRIUM           Index LA diam:        4.30 cm  2.23 cm/m   RA Area:     19.80 cm LA Vol (A2C):   94.0 ml  48.67 ml/m  RA Volume:   51.10 ml  26.46 ml/m LA Vol (A4C):   102.0 ml 52.82 ml/m LA Biplane Vol: 107.0 ml 55.40 ml/m  AORTIC VALVE LVOT Vmax:   75.60 cm/s LVOT Vmean:  48.600 cm/s LVOT VTI:    0.150 m  AORTA Ao Root diam: 4.10 cm MITRAL VALVE               TRICUSPID VALVE MV Area (PHT): 5.66 cm    TR Peak grad:   41.2 mmHg MV Decel Time: 134 msec    TR Vmax:        321.00 cm/s MV E velocity: 93.30 cm/s MV A  velocity: 30.20 cm/s  SHUNTS MV E/A ratio:  3.09        Systemic VTI:  0.15 m                            Systemic Diam: 2.00 cm Lyman Bishop MD Electronically signed by Lyman Bishop MD Signature Date/Time: 08/06/2022/4:42:49 PM    Final    CT HEAD WO CONTRAST (5MM)  Result Date: 08/06/2022 CLINICAL DATA:  Stroke follow-up. Contrast extravasation during procedure. EXAM: CT HEAD WITHOUT CONTRAST TECHNIQUE: Contiguous axial images were obtained from the base of the skull through the vertex without intravenous contrast. RADIATION DOSE REDUCTION: This exam was performed according to the departmental dose-optimization program which includes automated exposure control, adjustment of the mA and/or kV according to patient size and/or use of iterative reconstruction technique. COMPARISON:  CT head dated 1 day prior and intra procedural images obtained earlier the same day FINDINGS: Brain: There is hyperdensity overlying the left cerebral convexity likely reflecting predominantly extravasated contrast from the recent procedure, though superimposed subarachnoid hemorrhage is difficult to entirely exclude. The extent is similar to the intra procedural images There is hypodensity in the left frontal lobe suspicious for revolving cortical infarct (4-19),  increased in conspicuity compared to the study from 1 day prior. There is no other evidence of evolving territorial infarct. 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 The ventricles are stable in size. Additional patchy hypodensity in the supratentorial white matter likely reflecting sequela of background chronic white matter microangiopathy is unchanged There is no solid mass lesion. There is no mass effect or midline shift. Vascular: There is calcification of the bilateral cavernous ICAs. Skull: Normal. Negative for fracture or focal lesion. Sinuses/Orbits: The imaged paranasal sinuses are clear. Globes and orbits are unremarkable. Other: None. IMPRESSION: 1. Density overlying the left cerebral convexity is grossly similar to the intra procedural images obtained earlier the same day and is favored to reflect predominantly contrast extravasation; however, superimposed subarachnoid hemorrhage can not be entirely excluded. 2. Small focus of hypodensity in the left frontal lobe cortex suspicious for small evolving infarct. Electronically Signed   By: Valetta Mole M.D.   On: 08/06/2022 12:34   IR PERCUTANEOUS ART THROMBECTOMY/INFUSION INTRACRANIAL INC DIAG ANGIO  Result Date: 08/06/2022 INDICATION: 70 year old male with past medical history significant for hypertension, hyperlipidemia, sinus bradycardia and BPH; baseline modified Rankin scale 3. He presented to outside hospital with right-sided weakness, aphasia and gaze deviation; NIHSS 18. CT angiogram of the head and neck head equivocal findings in fall EXAM: ULTRASOUND-GUIDED VASCULAR ACCESS DIAGNOSTIC CEREBRAL ANGIOGRAM MECHANICAL THROMBECTOMY FLAT PANEL HEAD CT COMPARISON:  None Available. MEDICATIONS: Ancef 2 g IV. The antibiotic was administered within 1 hour of the procedure ANESTHESIA/SEDATION: The procedure was performed under general anesthesia. CONTRAST:  100 mL of Omnipaque 300 milligram/mL  FLUOROSCOPY: Radiation Exposure Index (as provided by the fluoroscopic device): 8,250 mGy Kerma COMPLICATIONS: SIR LEVEL B - Normal therapy, includes overnight admission for observation. TECHNIQUE: Informed written consent was obtained from the patient's wife after a thorough discussion of the procedural risks, benefits and alternatives. All questions were addressed. Maximal Sterile Barrier Technique was utilized including caps, mask, sterile gowns, sterile gloves, sterile drape, hand hygiene and skin antiseptic. A timeout was performed prior to the initiation of the procedure. The right groin was prepped and draped in the usual sterile fashion. Using a micropuncture kit and the modified Seldinger technique, access  was gained to the right common femoral artery and a 5 French sheath was placed. Real-time ultrasound guidance was utilized for vascular access including the acquisition of a permanent ultrasound image documenting patency of the accessed vessel. Under fluoroscopy, a 5 [place] Berenstein 2 catheter was navigated over a 0.035" Terumo Glidewire into the aortic arch. The catheter was placed into the left common carotid artery. Frontal roadmap was obtained and the catheter was then advanced over the wire into the left internal carotid artery. Frontal and lateral angiograms of the head were obtained. FINDINGS: 1. Mild atherosclerotic changes in the left common femoral artery without significant stenosis, adequate for vascular access. 2. 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 3. Hyperemia along the a patent left M3/MCA branch to the anterior parietal region with corresponding early venous opacification, suggesting ongoing ischemia. PROCEDURE: The Berenstein 2 catheter was removed over an exchange length Rosen wire and under biplane roadmap guidance. The 5 French sheath was then removed over the wire. An 8 French 25 cm femoral sheath was advanced over the wire. Then, a zoom 88 guide catheter was  advanced over the wire into the upper cervical segment of the left ICA. Frontal and lateral angiograms of the head were obtained. Using biplane roadmap guidance, a zoom 55 aspiration catheter was navigated over a phenom 21 microcatheter and a Aristotle 14 microguidewire into the cavernous segment of the left ICA. The microcatheter was then navigated over the wire into a left M3/MCA middle division branch. Attempt to advance the microcatheter over the wire proved unsuccessful due to significant resistance. The microcatheter was retracted frontal and lateral angiogram of the head were obtained. Small contrast extravasation was noted. The left M3/MCA branch appear recanalized with a persistent nonocclusive filling defect. Flat panel CT of the head was obtained and post processed in a separate workstation with concurrent attending physician supervision. Selected images were sent to PACS. Small contrast extravasation seen along the left sylvian fissure. Follow-up left internal carotid artery angiogram showed no evidence of further contrast extravasation. Delay left internal carotid artery angiogram showed stable recanalization with no evidence of further contrast extravasation. Repeat flat panel CT of the head was obtained and post processed in a separate workstation with concurrent attending physician supervision. Selected images were sent to PACS. Similar appearance of contrast extravasation in the left sylvian fissure. Repeat delay left internal carotid artery angiogram showed stable recanalization with no evidence of further contrast extravasation. Right common femoral artery angiogram was obtained in right anterior oblique view. The puncture is at the level of the common femoral artery. The artery has normal caliber, adequate for closure device. The sheath was exchanged over the wire for a Perclose prostyle which was utilized for access closure. Immediate hemostasis was achieved. IMPRESSION: 1. Diagnostic angiogram  revealed occlusion of a left M3/MCA branch. 2. Capillary hyperemia along patent anterior left parietal M3 branch with early venous drainage suggestive of ongoing ischemia. 3. Access to occluded branch with microwire resulted in recanalization (TICI 2C) with small contrast extravasation which resolved spontaneously. Delay flat panel CT and cerebral angiogram showed no further contrast extravasation. PLAN: Repeat head CT within 3 hours to evaluate for contrast extravasation stability. Electronically Signed   By: Pedro Earls M.D.   On: 08/06/2022 11:30   IR US Guide Vasc Access Right  Result Date: 08/06/2022 INDICATION: 71 year old male with past medical history significant for hypertension, hyperlipidemia, sinus bradycardia and BPH; baseline modified Rankin scale 3. He presented to outside hospital  with right-sided weakness, aphasia and gaze deviation; NIHSS 18. CT angiogram of the head and neck head equivocal findings in fall EXAM: ULTRASOUND-GUIDED VASCULAR ACCESS DIAGNOSTIC CEREBRAL ANGIOGRAM MECHANICAL THROMBECTOMY FLAT PANEL HEAD CT COMPARISON:  None Available. MEDICATIONS: Ancef 2 g IV. The antibiotic was administered within 1 hour of the procedure ANESTHESIA/SEDATION: The procedure was performed under general anesthesia. CONTRAST:  100 mL of Omnipaque 300 milligram/mL FLUOROSCOPY: Radiation Exposure Index (as provided by the fluoroscopic device): 2,297 mGy Kerma COMPLICATIONS: SIR LEVEL B - Normal therapy, includes overnight admission for observation. TECHNIQUE: Informed written consent was obtained from the patient's wife after a thorough discussion of the procedural risks, benefits and alternatives. All questions were addressed. Maximal Sterile Barrier Technique was utilized including caps, mask, sterile gowns, sterile gloves, sterile drape, hand hygiene and skin antiseptic. A timeout was performed prior to the initiation of the procedure. The right groin was prepped and draped in the  usual sterile fashion. Using a micropuncture kit and the modified Seldinger technique, access was gained to the right common femoral artery and a 5 French sheath was placed. Real-time ultrasound guidance was utilized for vascular access including the acquisition of a permanent ultrasound image documenting patency of the accessed vessel. Under fluoroscopy, a 5 [place] Berenstein 2 catheter was navigated over a 0.035" Terumo Glidewire into the aortic arch. The catheter was placed into the left common carotid artery. Frontal roadmap was obtained and the catheter was then advanced over the wire into the left internal carotid artery. Frontal and lateral angiograms of the head were obtained. FINDINGS: 1. Mild atherosclerotic changes in the left common femoral artery without significant stenosis, adequate for vascular access. 2. 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 3. Hyperemia along the a patent left M3/MCA branch to the anterior parietal region with corresponding early venous opacification, suggesting ongoing ischemia. PROCEDURE: The Berenstein 2 catheter was removed over an exchange length Rosen wire and under biplane roadmap guidance. The 5 French sheath was then removed over the wire. An 8 French 25 cm femoral sheath was advanced over the wire. Then, a zoom 88 guide catheter was advanced over the wire into the upper cervical segment of the left ICA. Frontal and lateral angiograms of the head were obtained. Using biplane roadmap guidance, a zoom 55 aspiration catheter was navigated over a phenom 21 microcatheter and a Aristotle 14 microguidewire into the cavernous segment of the left ICA. The microcatheter was then navigated over the wire into a left M3/MCA middle division branch. Attempt to advance the microcatheter over the wire proved unsuccessful due to significant resistance. The microcatheter was retracted frontal and lateral angiogram of the head were obtained. Small contrast extravasation was  noted. The left M3/MCA branch appear recanalized with a persistent nonocclusive filling defect. Flat panel CT of the head was obtained and post processed in a separate workstation with concurrent attending physician supervision. Selected images were sent to PACS. Small contrast extravasation seen along the left sylvian fissure. Follow-up left internal carotid artery angiogram showed no evidence of further contrast extravasation. Delay left internal carotid artery angiogram showed stable recanalization with no evidence of further contrast extravasation. Repeat flat panel CT of the head was obtained and post processed in a separate workstation with concurrent attending physician supervision. Selected images were sent to PACS. Similar appearance of contrast extravasation in the left sylvian fissure. Repeat delay left internal carotid artery angiogram showed stable recanalization with no evidence of further contrast extravasation. Right common femoral artery angiogram was obtained  in right anterior oblique view. The puncture is at the level of the common femoral artery. The artery has normal caliber, adequate for closure device. The sheath was exchanged over the wire for a Perclose prostyle which was utilized for access closure. Immediate hemostasis was achieved. IMPRESSION: 1. 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 2. Capillary hyperemia along patent anterior left parietal M3 branch with early venous drainage suggestive of ongoing ischemia. 3. Access to occluded branch with microwire resulted in recanalization (TICI 2C) with small contrast extravasation which resolved spontaneously. Delay flat panel CT and cerebral angiogram showed no further contrast extravasation. PLAN: Repeat head CT within 3 hours to evaluate for contrast extravasation stability. Electronically Signed   By: Pedro Earls M.D.   On: 08/06/2022 11:30   IR CT Head Ltd  Result Date: 08/06/2022 INDICATION:  71 year old male with past medical history significant for hypertension, hyperlipidemia, sinus bradycardia and BPH; baseline modified Rankin scale 3. He presented to outside hospital with right-sided weakness, aphasia and gaze deviation; NIHSS 18. CT angiogram of the head and neck head equivocal findings in fall EXAM: ULTRASOUND-GUIDED VASCULAR ACCESS DIAGNOSTIC CEREBRAL ANGIOGRAM MECHANICAL THROMBECTOMY FLAT PANEL HEAD CT COMPARISON:  None Available. MEDICATIONS: Ancef 2 g IV. The antibiotic was administered within 1 hour of the procedure ANESTHESIA/SEDATION: The procedure was performed under general anesthesia. CONTRAST:  100 mL of Omnipaque 300 milligram/mL FLUOROSCOPY: Radiation Exposure Index (as provided by the fluoroscopic device): 1,443 mGy Kerma COMPLICATIONS: SIR LEVEL B - Normal therapy, includes overnight admission for observation. TECHNIQUE: Informed written consent was obtained from the patient's wife after a thorough discussion of the procedural risks, benefits and alternatives. All questions were addressed. Maximal Sterile Barrier Technique was utilized including caps, mask, sterile gowns, sterile gloves, sterile drape, hand hygiene and skin antiseptic. A timeout was performed prior to the initiation of the procedure. The right groin was prepped and draped in the usual sterile fashion. Using a micropuncture kit and the modified Seldinger technique, access was gained to the right common femoral artery and a 5 French sheath was placed. Real-time ultrasound guidance was utilized for vascular access including the acquisition of a permanent ultrasound image documenting patency of the accessed vessel. Under fluoroscopy, a 5 [place] Berenstein 2 catheter was navigated over a 0.035" Terumo Glidewire into the aortic arch. The catheter was placed into the left common carotid artery. Frontal roadmap was obtained and the catheter was then advanced over the wire into the left internal carotid artery. Frontal and  lateral angiograms of the head were obtained. FINDINGS: 1. Mild atherosclerotic changes in the left common femoral artery without significant stenosis, adequate for vascular access. 2. 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 3. Hyperemia along the a patent left M3/MCA branch to the anterior parietal region with corresponding early venous opacification, suggesting ongoing ischemia. PROCEDURE: The Berenstein 2 catheter was removed over an exchange length Rosen wire and under biplane roadmap guidance. The 5 French sheath was then removed over the wire. An 8 French 25 cm femoral sheath was advanced over the wire. Then, a zoom 88 guide catheter was advanced over the wire into the upper cervical segment of the left ICA. Frontal and lateral angiograms of the head were obtained. Using biplane roadmap guidance, a zoom 55 aspiration catheter was navigated over a phenom 21 microcatheter and a Aristotle 14 microguidewire into the cavernous segment of the left ICA. The microcatheter was then navigated over the wire into a left M3/MCA middle division  branch. Attempt to advance the microcatheter over the wire proved unsuccessful due to significant resistance. The microcatheter was retracted frontal and lateral angiogram of the head were obtained. Small contrast extravasation was noted. The left M3/MCA branch appear recanalized with a persistent nonocclusive filling defect. Flat panel CT of the head was obtained and post processed in a separate workstation with concurrent attending physician supervision. Selected images were sent to PACS. Small contrast extravasation seen along the left sylvian fissure. Follow-up left internal carotid artery angiogram showed no evidence of further contrast extravasation. Delay left internal carotid artery angiogram showed stable recanalization with no evidence of further contrast extravasation. Repeat flat panel CT of the head was obtained and post processed in a separate  workstation with concurrent attending physician supervision. Selected images were sent to PACS. Similar appearance of contrast extravasation in the left sylvian fissure. Repeat delay left internal carotid artery angiogram showed stable recanalization with no evidence of further contrast extravasation. Right common femoral artery angiogram was obtained in right anterior oblique view. The puncture is at the level of the common femoral artery. The artery has normal caliber, adequate for closure device. The sheath was exchanged over the wire for a Perclose prostyle which was utilized for access closure. Immediate hemostasis was achieved. IMPRESSION: 1. 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 2. Capillary hyperemia along patent anterior left parietal M3 branch with early venous drainage suggestive of ongoing ischemia. 3. Access to occluded branch with microwire resulted in recanalization (TICI 2C) with small contrast extravasation which resolved spontaneously. Delay flat panel CT and cerebral angiogram showed no further contrast extravasation. PLAN: Repeat head CT within 3 hours to evaluate for contrast extravasation stability. Electronically Signed   By: Pedro Earls M.D.   On: 08/06/2022 11:30   IR CT Head Ltd  Result Date: 08/06/2022 INDICATION: 71 year old male with past medical history significant for hypertension, hyperlipidemia, sinus bradycardia and BPH; baseline modified Rankin scale 3. He presented to outside hospital with right-sided weakness, aphasia and gaze deviation; NIHSS 18. CT angiogram of the head and neck head equivocal findings in fall EXAM: ULTRASOUND-GUIDED VASCULAR ACCESS DIAGNOSTIC CEREBRAL ANGIOGRAM MECHANICAL THROMBECTOMY FLAT PANEL HEAD CT COMPARISON:  None Available. MEDICATIONS: Ancef 2 g IV. The antibiotic was administered within 1 hour of the procedure ANESTHESIA/SEDATION: The procedure was performed under general anesthesia. CONTRAST:  100 mL  of Omnipaque 300 milligram/mL FLUOROSCOPY: Radiation Exposure Index (as provided by the fluoroscopic device): 8,546 mGy Kerma COMPLICATIONS: SIR LEVEL B - Normal therapy, includes overnight admission for observation. TECHNIQUE: Informed written consent was obtained from the patient's wife after a thorough discussion of the procedural risks, benefits and alternatives. All questions were addressed. Maximal Sterile Barrier Technique was utilized including caps, mask, sterile gowns, sterile gloves, sterile drape, hand hygiene and skin antiseptic. A timeout was performed prior to the initiation of the procedure. The right groin was prepped and draped in the usual sterile fashion. Using a micropuncture kit and the modified Seldinger technique, access was gained to the right common femoral artery and a 5 French sheath was placed. Real-time ultrasound guidance was utilized for vascular access including the acquisition of a permanent ultrasound image documenting patency of the accessed vessel. Under fluoroscopy, a 5 [place] Berenstein 2 catheter was navigated over a 0.035" Terumo Glidewire into the aortic arch. The catheter was placed into the left common carotid artery. Frontal roadmap was obtained and the catheter was then advanced over the wire into the left internal carotid artery. Frontal  and lateral angiograms of the head were obtained. FINDINGS: 1. Mild atherosclerotic changes in the left common femoral artery without significant stenosis, adequate for vascular access. 2. 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 3. Hyperemia along the a patent left M3/MCA branch to the anterior parietal region with corresponding early venous opacification, suggesting ongoing ischemia. PROCEDURE: The Berenstein 2 catheter was removed over an exchange length Rosen wire and under biplane roadmap guidance. The 5 French sheath was then removed over the wire. An 8 French 25 cm femoral sheath was advanced over the wire. Then, a  zoom 88 guide catheter was advanced over the wire into the upper cervical segment of the left ICA. Frontal and lateral angiograms of the head were obtained. Using biplane roadmap guidance, a zoom 55 aspiration catheter was navigated over a phenom 21 microcatheter and a Aristotle 14 microguidewire into the cavernous segment of the left ICA. The microcatheter was then navigated over the wire into a left M3/MCA middle division branch. Attempt to advance the microcatheter over the wire proved unsuccessful due to significant resistance. The microcatheter was retracted frontal and lateral angiogram of the head were obtained. Small contrast extravasation was noted. The left M3/MCA branch appear recanalized with a persistent nonocclusive filling defect. Flat panel CT of the head was obtained and post processed in a separate workstation with concurrent attending physician supervision. Selected images were sent to PACS. Small contrast extravasation seen along the left sylvian fissure. Follow-up left internal carotid artery angiogram showed no evidence of further contrast extravasation. Delay left internal carotid artery angiogram showed stable recanalization with no evidence of further contrast extravasation. Repeat flat panel CT of the head was obtained and post processed in a separate workstation with concurrent attending physician supervision. Selected images were sent to PACS. Similar appearance of contrast extravasation in the left sylvian fissure. Repeat delay left internal carotid artery angiogram showed stable recanalization with no evidence of further contrast extravasation. Right common femoral artery angiogram was obtained in right anterior oblique view. The puncture is at the level of the common femoral artery. The artery has normal caliber, adequate for closure device. The sheath was exchanged over the wire for a Perclose prostyle which was utilized for access closure. Immediate hemostasis was achieved.  IMPRESSION: 1. 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 2. Capillary hyperemia along patent anterior left parietal M3 branch with early venous drainage suggestive of ongoing ischemia. 3. Access to occluded branch with microwire resulted in recanalization (TICI 2C) with small contrast extravasation which resolved spontaneously. Delay flat panel CT and cerebral angiogram showed no further contrast extravasation. PLAN: Repeat head CT within 3 hours to evaluate for contrast extravasation stability. Electronically Signed   By: Pedro Earls M.D.   On: 08/06/2022 11:30   CT ANGIO HEAD NECK W WO CM W PERF (CODE STROKE)  Result Date: 08/06/2022 CLINICAL DATA:  71 year old male code stroke presentation, left MCA symptomatology. EXAM: CT ANGIOGRAPHY HEAD AND NECK CT PERFUSION BRAIN TECHNIQUE: Multidetector CT imaging of the head and neck was performed using the standard protocol during bolus administration of intravenous contrast. Multiplanar CT image reconstructions and MIPs were obtained to evaluate the vascular anatomy. Carotid stenosis measurements (when applicable) are obtained utilizing NASCET criteria, using the distal internal carotid diameter as the denominator. Multiphase CT imaging of the brain was performed following IV bolus contrast injection. Subsequent parametric perfusion maps were calculated using RAPID software. RADIATION DOSE REDUCTION: This exam was performed according to the departmental  dose-optimization program which includes automated exposure control, adjustment of the mA and/or kV according to patient size and/or use of iterative reconstruction technique. CONTRAST:  172m OMNIPAQUE IOHEXOL 350 MG/ML SOLN COMPARISON:  Plain head CT 0449 hours today. CT cervical spine reported separately. FINDINGS: CT Brain Perfusion Findings: ASPECTS: Ten CBF (<30%) Volume: 039m CBV parameter changes of 13-16 mL at the left operculum. The Perfusion (Tmax>6.0s) volume:  12342m Hypoperfusion index 0.2. Mismatch Volume: 123m37mfarction Location:Left MCA territory CTA NECK Skeleton: Cervical spine CT detailed separately. No acute osseous abnormality identified. Upper chest: Mild dependent atelectasis. Mild retained secretions in the upper thoracic esophagus. Other neck: Left thyroid nodules smaller than 1.5 cm Not clinically significant; no follow-up imaging recommended (ref: J Am Coll Radiol. 2015 Feb;12(2): 143-50).Otherwise negative. Aortic arch: Calcified aortic atherosclerosis. 3 vessel arch configuration. Right carotid system: Minimal plaque. Mild motion artifact distal to the right ICA bulb. No stenosis. Left carotid system: Mild tortuosity and minimal plaque. No stenosis. Vertebral arteries: Minimal plaque except at the left vertebral artery origin and V1 segment (series 7, image 290) where there is mild stenosis. No other stenosis along the course of both vertebral arteries to the skull base. CTA HEAD Posterior circulation: Moderate left vertebral artery soft plaque and stenosis series 7, image 155. This is proximal to the left PICA origin which remains patent. Patent vertebrobasilar junction. No distal right vertebral stenosis. Patent basilar artery without stenosis. Patent SCA and PCA origins. Posterior communicating arteries are diminutive or absent. Bilateral PCA branches appear fairly symmetric and within normal limits. No proximal left PCA occlusion. Anterior circulation: Bilateral ICA siphon atherosclerosis, with up to moderate plaque. Mild to moderate supraclinoid left ICA stenosis series 9, image 101. Contralateral right ICA siphon stenosis. Patent carotid termini. Patent MCA and ACA origins. Anterior communicating artery and bilateral ACA branches are within normal limits. Median artery of the corpus callosum (normal variant). Right MCA M1 segment and trifurcation are patent without stenosis. Right MCA branches are within normal limits. Left MCA M1 segment and  left MCA trifurcation are patent. No left MCA M2 branch occlusion is identified. However, there is evidence of middle division M3 branch occlusion on series 12, image 26, corresponding to the level of the operculum. Venous sinuses: Grossly patent. Anatomic variants: Median artery of the corpus callosum. Review of the MIP images confirms the above findings Preliminary report of the above discussed by telephone with Dr. CHRIJoseph Berkshire8/17/2023 at 05:22 . IMPRESSION: 1. CT Perfusion is positive for left MCA territory oligemia, proximally 120 mL. No infarct core detected by strict criteria, but abnormal CBV at the left operculum. 2. No large vessel occlusion identified on CTA, but left M3 operculum branch occlusion(s) suspected. Still, given the CTP findings and presentation recommend discussion with Neurology for consideration of NIR intervention. 3. The above was discussed by telephone with Dr. CHRIJoseph Berkshirethe ED on 08/06/2022 at 0522 hours, and also Dr. BhagCurly [phone]. 4. 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 Bilateral ICA siphon plaque with up to moderate Left supraclinoid stenosis. Moderate Left Vertebral V4 stenosis. No proximal Left PCA branch occlusion (small age indeterminate left occipital pole infarct on plain CT). Electronically Signed   By: H  HGenevie Ann.   On: 08/06/2022 05:34   CT Cervical Spine Wo Contrast  Result Date: 08/06/2022 CLINICAL DATA:  71 y21r old male code stroke patient. Right side weakness. EXAM: CT CERVICAL SPINE WITHOUT CONTRAST TECHNIQUE: Multidetector CT imaging of the cervical spine was performed without intravenous  contrast. Multiplanar CT image reconstructions were also generated. RADIATION DOSE REDUCTION: This exam was performed according to the departmental dose-optimization program which includes automated exposure control, adjustment of the mA and/or kV according to patient size and/or use of iterative reconstruction  technique. COMPARISON:  Plain head CT the same time reported separately. FINDINGS: Alignment: Straightening of cervical lordosis. Cervicothoracic junction alignment is within normal limits. Bilateral posterior element alignment is within normal limits. Skull base and vertebrae: Visualized skull base is intact. No atlanto-occipital dissociation. C1 and C2 appear intact and aligned. No acute osseous abnormality identified. Soft tissues and spinal canal: No prevertebral fluid or swelling. No visible canal hematoma. Elongated stylohyoid ligament calcification greater on the left. Disc levels: Widespread advanced cervical spine degeneration. Multilevel mild and up to moderate cervical spinal stenosis (series 9, image 31). Upper chest: Mild apical lung scarring. Chronic appearing mild T1 superior endplate deformity, Schmorl's node. Other: 14 mm left thyroid nodule, Not clinically significant; no follow-up imaging recommended (ref: J Am Coll Radiol. 2015 Feb;12(2): 143-50). IMPRESSION: 1. No acute traumatic injury identified in the cervical spine. 2. 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 Electronically Signed   By: Genevie Ann M.D.   On: 08/06/2022 05:19   CT HEAD CODE STROKE WO CONTRAST  Result Date: 08/06/2022 CLINICAL DATA:  Code stroke. 71 year old male. Fall at 0300 hours. Right side weakness and abnormal speech. EXAM: CT HEAD WITHOUT CONTRAST TECHNIQUE: Contiguous axial images were obtained from the base of the skull through the vertex without intravenous contrast. RADIATION DOSE REDUCTION: This exam was performed according to the departmental dose-optimization program which includes automated exposure control, adjustment of the mA and/or kV according to patient size and/or use of iterative reconstruction technique. COMPARISON:  None Available. FINDINGS: Brain: Cerebral volume is within normal limits for age. No midline shift, mass effect, or  evidence of intracranial mass lesion. No ventriculomegaly. No acute intracranial hemorrhage identified. Patchy and confluent bilateral cerebral white matter hypodensity, fairly symmetric. No cortically based acute infarct identified in the left MCA territory. But there is confluent hypodensity in the left occipital pole in a small area on series 5, image 16 (about 15-20 mm). Vascular: Calcified atherosclerosis at the skull base. No suspicious intracranial vascular hyperdensity. Skull: No acute osseous abnormality identified. Sinuses/Orbits: Visualized paranasal sinuses and mastoids are clear. Other: Leftward gaze. Scalp soft tissues appear within normal limits. ASPECTS [facility] Stroke Program Early CT Score) Total score (0-10 with 10 being normal): 10 (cytotoxic edema only in the left PCA territory). IMPRESSION: 1. Leftward gaze deviation, raising the possibility of cortical ischemia in this setting. No Left MCA territory cortical infarct changes are identified. But there is a small age indeterminate Left PCA infarct at the left occipital pole. 2. No acute intracranial hemorrhage identified. ASPECTS 10. 3. Advanced bilateral white matter changes most commonly due to small vessel disease. Study discussed by telephone with Dr. Joseph Berkshire on 08/06/2022 at 05:11 . Electronically Signed   By: Genevie Ann M.D.   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, will consult EP   Ischemic stroke Acute left MCA  ischemic infarct s/p thrombectomy of left M3 occlusion MRI brain with multifocal LMCA with small petechial hemorrhagic conversion   Need for anticoagulation Concern for small petechial hemorrhagic conversion No OAC until cleared by neurology - would recommend 5 mg eliquis BID   Chronic systolic and diastolic heart failure Moderate MR Echo with LVEF 35-40%, G III DD, severe LA dilation Hx of EF 45% not on GDMT Does not have a history of hypertension, no chest pain, no history of heart disease - once rate control strategy is in place, we can taylor heart failure medications based on BP room - will defer ischemic evaluation for now     Risk Assessment/Risk Scores:    New York Heart Association (NYHA) Functional Class NYHA Class IV - on supplemental O2  CHA2DS2-VASc Score = 4   This indicates a 4.8% annual risk of stroke. The patient's score is based upon: CHF History: 1 HTN History: 0 Diabetes History: 0 Stroke History: 2 Vascular Disease History: 0 Age Score: 1 Gender Score: 0      For questions or updates, please contact [place] Please consult [email or web address] for contact info under    Signed, Ledora Bottcher, [place]  08/08/2022 10:57 AM  Patient seen and examined.  Agree with above documentation.  Dustin Blanchard is a 71 year old male with a history of hypertension, hyperlipidemia, chronic systolic heart failure who presents with CVA and we are consulted by Dr. 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  Most recent echo in 2019 showed EF 45%.  He presented with a code stroke, found to have left MCA territory occlusion which was treated with thrombectomy.  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  Echocardiogram shows EF 35 to 40%, moderate pericardial effusion, low normal RV function.  He went into A-fib with RVR yesterday was started on Cardizem drip.  Subsequently became bradycardic.  Has had conversion pauses up to 4.2 seconds.  However has also  had sinus pauses up to 4.7 seconds.  EKG shows normal sinus rhythm with first-degree AV block, rate 62, PVCs. On exam, patient is not able to communicate but does follow some commands, no murmurs, lungs CTA B, no lower extremity edema.  He is having A-fib with RVR with postconversion pauses, but also having sinus pauses up to 4.7 seconds in addition to intermittent junctional rhythm in the 30s.  Currently in sinus rhythm in 60s.  Will consult EP for pacemaker evaluation.  Donato Heinz, MD

## 2022-08-09 DIAGNOSIS — I5041 Acute combined systolic (congestive) and diastolic (congestive) heart failure: Secondary | ICD-10-CM

## 2022-08-09 DIAGNOSIS — I455 Other specified heart block: Secondary | ICD-10-CM | POA: Diagnosis not present

## 2022-08-09 DIAGNOSIS — I63512 Cerebral infarction due to unspecified occlusion or stenosis of left middle cerebral artery: Secondary | ICD-10-CM | POA: Diagnosis not present

## 2022-08-09 DIAGNOSIS — I495 Sick sinus syndrome: Secondary | ICD-10-CM | POA: Diagnosis not present

## 2022-08-09 DIAGNOSIS — I4891 Unspecified atrial fibrillation: Secondary | ICD-10-CM | POA: Diagnosis not present

## 2022-08-09 LAB — COMPREHENSIVE METABOLIC PANEL
ALT: 10 U/L (ref 0–44)
AST: 15 U/L (ref 15–41)
Albumin: 3 g/dL — ABNORMAL LOW (ref 3.5–5.0)
Alkaline Phosphatase: 55 U/L (ref 38–126)
Anion gap: 5 (ref 5–15)
BUN: 14 mg/dL (ref 8–23)
CO2: 21 mmol/L — ABNORMAL LOW (ref 22–32)
Calcium: 8.4 mg/dL — ABNORMAL LOW (ref 8.9–10.3)
Chloride: 112 mmol/L — ABNORMAL HIGH (ref 98–111)
Creatinine, Ser: 0.67 mg/dL (ref 0.61–1.24)
GFR, Estimated: >60 mL/min (ref >60)
Glucose, Bld: 132 mg/dL — ABNORMAL HIGH (ref 70–99)
Potassium: 3.8 mmol/L (ref 3.5–5.1)
Sodium: 138 mmol/L (ref 135–145)
Total Bilirubin: 1 mg/dL (ref 0.3–1.2)
Total Protein: 5.3 g/dL — ABNORMAL LOW (ref 6.5–8.1)

## 2022-08-09 LAB — GLUCOSE, CAPILLARY
Glucose-Capillary: 115 mg/dL — ABNORMAL HIGH (ref 70–99)
Glucose-Capillary: 109 mg/dL — ABNORMAL HIGH (ref 70–99)
Glucose-Capillary: 108 mg/dL — ABNORMAL HIGH (ref 70–99)
Glucose-Capillary: 97 mg/dL (ref 70–99)
Glucose-Capillary: 93 mg/dL (ref 70–99)
Glucose-Capillary: 98 mg/dL (ref 70–99)

## 2022-08-09 LAB — PHOSPHORUS: Phosphorus: 2.1 mg/dL — ABNORMAL LOW (ref 2.5–4.6)

## 2022-08-09 LAB — MAGNESIUM
Magnesium: 2.1 mg/dL (ref 1.7–2.4)
Magnesium: 2.1 mg/dL (ref 1.7–2.4)

## 2022-08-09 MED ORDER — ATORVASTATIN CALCIUM 40 MG PO TABS
40.0000 mg | ORAL_TABLET | Freq: Every day | ORAL | Status: DC
  Administered 2022-08-10 – 2022-08-12 (×3): 40 mg
  Filled 2022-08-09 (×3): qty 1

## 2022-08-09 MED ORDER — PANTOPRAZOLE 2 MG/ML SUSPENSION
40.0000 mg | Freq: Every day | ORAL | Status: DC
  Administered 2022-08-10 – 2022-08-11 (×2): 40 mg
  Filled 2022-08-09 (×2): qty 20

## 2022-08-09 MED ORDER — AMIODARONE LOAD VIA INFUSION
150.0000 mg | Freq: Once | INTRAVENOUS | Status: AC
  Administered 2022-08-09: 150 mg via INTRAVENOUS
  Filled 2022-08-09: qty 83.34

## 2022-08-09 MED ORDER — AMIODARONE HCL IN DEXTROSE 360-4.14 MG/200ML-% IV SOLN
30.0000 mg/h | INTRAVENOUS | Status: DC
  Administered 2022-08-09 – 2022-08-10 (×4): 30 mg/h via INTRAVENOUS
  Filled 2022-08-09 (×4): qty 200

## 2022-08-09 MED ORDER — AMIODARONE HCL IN DEXTROSE 360-4.14 MG/200ML-% IV SOLN
60.0000 mg/h | INTRAVENOUS | Status: AC
  Administered 2022-08-09 (×2): 60 mg/h via INTRAVENOUS
  Filled 2022-08-09 (×2): qty 200

## 2022-08-09 NOTE — Progress Notes (Signed)
Inpatient Rehab Admissions Coordinator:    I spoke with pt.'s wife regarding potential CIR admit. She is interested and can provide 24/7 support at d/c. . I will open case with Pt.'s insurance once medically stable.  Megan Salon, MS, CCC-SLP Rehab Admissions Coordinator  [phone] (celll) [phone] (office)

## 2022-08-09 NOTE — Consult Note (Signed)
Cardiology Consultation:   Patient ID: Dustin Blanchard MRN: [id number]; DOB: May 06, 1951  Admit date: 08/06/2022 Date of Consult: 08/09/2022  PCP:  [facility] Cardiologist:  Donato Heinz, MD        Patient Profile:   Dustin Blanchard is a 71 y.o. 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 Gardiner Rhyme.  History of Present Illness:   Dustin Blanchard is a very pleasant 71 year old man with a history of hypertension and dyslipidemia.  He has a history of palpitations which she thought was secondary to eating too many sweets.  He also has a history of pericardial effusion which had been followed.  He has mild left ventricular dysfunction by echo but most recently moderate LV dysfunction.  The patient presented with difficulty speaking and right facial droop and right-sided weakness and was found to have a left M3 occlusion treated with thrombectomy.  He has persistent global aphasia and right-sided weakness arm greater than leg.  The patient was found to have both SVT as well as atrial flutter and posttermination pauses.  He has been treated with intravenous Cardizem and the pauses worsened.  These have improved.  The patient has not had syncope according to his family.   Past Medical History:  Diagnosis Date   Dementia [facility])     Past Surgical History:  Procedure Laterality Date   HERNIA REPAIR     HIP SURGERY     IR CT HEAD LTD  08/06/2022   IR CT HEAD LTD  08/06/2022   IR PERCUTANEOUS ART THROMBECTOMY/INFUSION INTRACRANIAL INC DIAG ANGIO  08/06/2022   IR US GUIDE VASC ACCESS RIGHT  08/06/2022   RADIOLOGY WITH ANESTHESIA N/A 08/06/2022   Procedure: IR WITH ANESTHESIA;  Surgeon: Luanne Bras, MD;  Location: [facility];  Service: Radiology;  Laterality: N/A;     Home Medications:  Prior to Admission medications   Medication Sig Start Date End Date Taking? Authorizing  Provider  acetaminophen (TYLENOL) 500 MG tablet Take 500 mg by mouth as needed (pain).   Yes [provider]  atorvastatin (LIPITOR) 20 MG tablet Take 20 mg by mouth daily. 05/24/22  Yes [provider]  Cholecalciferol (VITAMIN D3) 125 MCG (5000 UT) CAPS Take 5,000 Units by mouth daily.   Yes [provider]  ibuprofen (ADVIL) 200 MG tablet Take 200 mg by mouth as needed (pain).   Yes [provider]  polyethylene glycol (MIRALAX / GLYCOLAX) 17 g packet Take 17 g by mouth daily as needed for mild constipation.   Yes [provider]  tamsulosin (FLOMAX) 0.4 MG CAPS capsule Take 0.4 mg by mouth daily. 07/27/22  Yes [provider]    Inpatient Medications: Scheduled Meds:  aspirin  300 mg Rectal Daily   Or   aspirin  81 mg Per Tube Daily   atorvastatin  20 mg Per Tube Daily   Chlorhexidine Gluconate Cloth  6 each Topical Daily   enoxaparin (LOVENOX) injection  40 mg Subcutaneous Q24H   feeding supplement (PROSource TF20)  60 mL Per Tube BID   labetalol  20 mg Intravenous Once   multivitamin with minerals  1 tablet Per Tube Daily   pantoprazole (PROTONIX) IV  40 mg Intravenous QHS   QUEtiapine  25 mg Per Tube Q supper   valproic acid  250 mg Per Tube TID AC   Continuous Infusions:  sodium chloride  15 mL/hr at 08/09/22 0700   clevidipine Stopped (08/06/22 1941)   diltiazem (CARDIZEM) infusion Stopped (08/07/22 1754)   feeding supplement (OSMOLITE 1.5 CAL) 50 mL/hr at 08/09/22 0700   PRN Meds: acetaminophen **OR** acetaminophen (TYLENOL) oral liquid 160 mg/5 mL **OR** acetaminophen, mouth rinse, QUEtiapine, senna-docusate  Allergies:   No Known Allergies  Social History:   Social History   Socioeconomic History   Marital status: Married    Spouse name: Not on file   Number of children: Not on file   Years of education: Not on file   Highest education level: Not on file  Occupational History   Not on file  Tobacco Use    Smoking status: Former   Smokeless tobacco: Never  Substance and Sexual Activity   Alcohol use: No   Drug use: Not on file   Sexual activity: Never  Other Topics Concern   Not on file  Social History Narrative   Not on file   Social Determinants of Health   Financial Resource Strain: Not on file  Food Insecurity: Not on file  Transportation Needs: Not on file  Physical Activity: Not on file  Stress: Not on file  Social Connections: Not on file  Intimate Partner Violence: Not on file    Family History:   No family history on file.   ROS:  Please see the history of present illness.  The patient is unable to provide history All other ROS reviewed and negative.     Physical Exam/Data:   Vitals:   08/09/22 0300 08/09/22 0400 08/09/22 0500 08/09/22 0700  BP: 129/82 129/79 133/84 129/88  Pulse: 64 61 65 72  Resp: 18 17 17  (!) 21  Temp:  98.1 F (36.7 C)    TempSrc:  Axillary    SpO2: 96% 96% 97% 92%  Weight:   64.8 kg   Height:        Intake/Output Summary (Last 24 hours) at 08/09/2022 1041 Last data filed at 08/09/2022 0700 Gross per 24 hour  Intake 1587.3 ml  Output 1400 ml  Net 187.3 ml      08/09/2022    5:00 AM 08/08/2022    3:33 AM 08/06/2022    4:38 AM  Last 3 Weights  Weight (lbs) 142 lb 13.7 oz 145 lb 8.1 oz 158 lb 11.7 oz  Weight (kg) 64.8 kg 66 kg 72 kg     Body mass index is 19.38 kg/m.  General:  Well nourished, well developed, in no acute distress HEENT: normal Neck: no JVD Vascular: No carotid bruits; Distal pulses 2+ bilaterally Cardiac:  normal S1, S2; RRR; no murmur  Lungs:  clear to auscultation bilaterally, no wheezing, rhonchi or rales  Abd: soft, nontender, no hepatomegaly  Ext: no edema Musculoskeletal:  No deformities, BUE and BLE strength normal and equal Skin: warm and dry  Neuro: Aphasic, dense right hemiparesis Psych:  Normal affect   EKG:  The EKG was personally reviewed and demonstrates: Normal sinus rhythm Telemetry:   Telemetry was personally reviewed and demonstrates: SVT as well as atrial flutter with posttermination pauses now normal sinus rhythm.  Relevant CV Studies: None  Laboratory Data:  High Sensitivity Troponin:  No results for input(s): "TROPONINIHS" in the last 720 hours.   Chemistry Recent Labs  Lab 08/07/22 0241 08/08/22 0449 08/08/22 2011 08/09/22 0603  NA 142 141  --  138  K 4.1 3.8  --  3.8  CL 114* 111  --  112*  CO2 18*  23  --  21*  GLUCOSE 91 113*  --  132*  BUN 6* 12  --  14  CREATININE 0.94 0.78  --  0.67  CALCIUM 8.6* 8.7*  --  8.4*  MG 2.1 2.1 2.2 2.1  GFRNONAA >60 >60  --  >60  ANIONGAP 10 7  --  5    Recent Labs  Lab 08/07/22 0241 08/08/22 0449 08/09/22 0603  PROT 5.7* 5.2* 5.3*  ALBUMIN 3.2* 3.0* 3.0*  AST 22 16 15   ALT 11 10 10   ALKPHOS 58 53 55  BILITOT 0.8 0.9 1.0   Lipids  Recent Labs  Lab 08/07/22 0241  CHOL 130  TRIG 60  HDL 47  LDLCALC 71  CHOLHDL 2.8    Hematology Recent Labs  Lab 08/06/22 0437  WBC 4.2  RBC 4.22  HGB 13.7  HCT 40.5  MCV 96.0  MCH 32.5  MCHC 33.8  RDW 12.3  PLT 210   Thyroid No results for input(s): "TSH", "FREET4" in the last 168 hours.  BNPNo results for input(s): "BNP", "PROBNP" in the last 168 hours.  DDimer No results for input(s): "DDIMER" in the last 168 hours.   Radiology/Studies:  DG Swallowing Func-Speech Pathology  Result Date: 08/07/2022 Table formatting from the original result was not included. Objective Swallowing Evaluation: Type of Study: MBS-Modified Barium Swallow Study  Patient Details Name: Dustin Blanchard MRN: [id number] Date of Birth: [birth year] Today's Date: 08/07/2022 Time: SLP Start Time (ACUTE ONLY): 1312 -SLP Stop Time (ACUTE ONLY): 1334 SLP Time Calculation (min) (ACUTE ONLY): 22 min Past Medical History: Past Medical History: Diagnosis Date  Dementia [facility])  Past Surgical History: Past Surgical History: Procedure Laterality Date  HERNIA REPAIR    HIP SURGERY    IR CT HEAD LTD   08/06/2022  IR CT HEAD LTD  08/06/2022  IR PERCUTANEOUS ART THROMBECTOMY/INFUSION INTRACRANIAL INC DIAG ANGIO  08/06/2022  IR US GUIDE VASC ACCESS RIGHT  08/06/2022  RADIOLOGY WITH ANESTHESIA N/A 08/06/2022  Procedure: IR WITH ANESTHESIA;  Surgeon: Luanne Bras, MD;  Location: [facility];  Service: Radiology;  Laterality: N/A; HPI: Pt is a 71 y.o. male who presented with right-sided weakness and aphasia. CT head: Density overlying the left cerebral convexity is grossly similar  to the intra procedural images obtained earlier the same day and is  favored to reflect predominantly contrast extravasation; however,  superimposed subarachnoid hemorrhage can not be entirely excluded. Small focus of hypodensity in the left frontal lobe cortex suspicious for small evolving infarct. PMH: hypertension, hyperlipidemia, sinus bradycardia, BPH, vascular dementia (dx in Dec, 2022 per family)  No data recorded  Recommendations for follow up therapy are one component of a multi-disciplinary discharge planning process, led by the attending physician.  Recommendations may be updated based on patient status, additional functional criteria and insurance authorization. Assessment / Plan / Recommendation   08/07/2022   1:12 PM Clinical Impressions Clinical Impression Pt presents with oropharyngeal dysphagia characterized by weak lingual manipulation, a pharyngeal delay, and reduction in bolus cohesion, tongue base retraction, anterior laryngeal movement, epiglottic inversion, and pharyngeal constriction. He exhibited premature spillage to the pyriform sinuses, vallecular residue, pyriform sinus residue, and reduced laryngeal closure. He demonstrated penetration (PAS 3, 5) and/or silent aspiration (PAS 8) across consistencies. Laryngeal invasion was secondary to the pharyngeal delay, reduced laryngeal closure, and residue in the pyriform sinuses. Episodes of aspiration intermittently triggered subtle throat clearing, but no instances resulted  in coughing, and throat clearing was ineffective in expelling material. More  viscous boluses (i.e., thickened liquids/solids) reduced the immediacy of aspiration, but aspiration was still noted from residue. Multiple secondary swallows reduced pharyngeal residue, but not to a functional level. It is recommended that the pt's NPO status be maintained and that short-term non-oral alimentation (e.g., cortrak) be initiated. Pt may have limited (i.e., ~4-5/hour) individual ice chips after thorough oral care and SLP will follow for dysphagia treatment. SLP Visit Diagnosis Dysphagia, oropharyngeal phase (R13.12) Impact on safety and function Moderate aspiration risk;Severe aspiration risk     08/07/2022   1:12 PM Treatment Recommendations Treatment Recommendations Therapy as outlined in treatment plan below     08/07/2022   1:12 PM Prognosis Prognosis for Safe Diet Advancement Good Barriers to Reach Goals Cognitive deficits;Language deficits;Severity of deficits   08/07/2022   1:12 PM Diet Recommendations SLP Diet Recommendations NPO;Ice chips PRN after oral care Medication Administration Via alternative means     08/07/2022   1:12 PM Other Recommendations Oral Care Recommendations Oral care QID;Staff/trained caregiver to provide oral care Follow Up Recommendations Acute inpatient rehab (3hours/day) Assistance recommended at discharge Frequent or constant Supervision/Assistance Functional Status Assessment Patient has had a recent decline in their functional status and demonstrates the ability to make significant improvements in function in a reasonable and predictable amount of time.   08/07/2022   1:12 PM Frequency and Duration  Speech Therapy Frequency (ACUTE ONLY) min 2x/week Treatment Duration 2 weeks     08/07/2022   1:12 PM Oral Phase Oral Phase Impaired Oral - Honey Teaspoon Lingual/palatal residue;Weak lingual manipulation;Delayed oral transit;Decreased bolus cohesion;Premature spillage Oral - Honey Cup Lingual/palatal  residue;Weak lingual manipulation;Delayed oral transit;Decreased bolus cohesion;Premature spillage Oral - Nectar Cup Lingual/palatal residue;Weak lingual manipulation;Delayed oral transit;Decreased bolus cohesion;Premature spillage Oral - Nectar Straw Lingual/palatal residue;Weak lingual manipulation;Delayed oral transit;Premature spillage;Decreased bolus cohesion Oral - Thin Cup Lingual/palatal residue;Weak lingual manipulation;Right anterior bolus loss;Delayed oral transit;Decreased bolus cohesion;Premature spillage Oral - Thin Straw Lingual/palatal residue;Weak lingual manipulation;Right anterior bolus loss;Premature spillage;Decreased bolus cohesion;Delayed oral transit Oral - Puree Lingual/palatal residue;Weak lingual manipulation;Decreased bolus cohesion;Premature spillage Oral - Mech Soft Lingual/palatal residue;Weak lingual manipulation;Premature spillage;Decreased bolus cohesion;Delayed oral transit;Impaired mastication    08/07/2022   1:12 PM Pharyngeal Phase Pharyngeal Phase Impaired Pharyngeal- Honey Teaspoon Reduced pharyngeal peristalsis;Reduced epiglottic inversion;Delayed swallow initiation-pyriform sinuses;Reduced anterior laryngeal mobility;Reduced airway/laryngeal closure;Reduced tongue base retraction;Penetration/Apiration after swallow;Trace aspiration;Pharyngeal residue - valleculae;Pharyngeal residue - pyriform;Pharyngeal residue - posterior pharnyx Pharyngeal Material enters airway, passes BELOW cords without attempt by patient to eject out (silent aspiration);Material enters airway, CONTACTS cords and not ejected out Pharyngeal- Honey Cup Reduced pharyngeal peristalsis;Reduced epiglottic inversion;Delayed swallow initiation-pyriform sinuses;Reduced anterior laryngeal mobility;Reduced airway/laryngeal closure;Reduced tongue base retraction;Penetration/Apiration after swallow;Trace aspiration;Pharyngeal residue - valleculae;Pharyngeal residue - pyriform;Pharyngeal residue - posterior  pharnyx;Penetration/Aspiration during swallow Pharyngeal Material enters airway, remains ABOVE vocal cords and not ejected out;Material enters airway, CONTACTS cords and not ejected out;Material enters airway, passes BELOW cords without attempt by patient to eject out (silent aspiration) Pharyngeal- Nectar Cup Reduced pharyngeal peristalsis;Reduced epiglottic inversion;Delayed swallow initiation-pyriform sinuses;Reduced anterior laryngeal mobility;Reduced airway/laryngeal closure;Reduced tongue base retraction;Pharyngeal residue - valleculae;Pharyngeal residue - pyriform;Pharyngeal residue - posterior pharnyx;Penetration/Aspiration during swallow;Penetration/Apiration after swallow Pharyngeal Material enters airway, CONTACTS cords and not ejected out;Material enters airway, passes BELOW cords without attempt by patient to eject out (silent aspiration) Pharyngeal- Nectar Straw Reduced pharyngeal peristalsis;Reduced epiglottic inversion;Delayed swallow initiation-pyriform sinuses;Reduced anterior laryngeal mobility;Reduced airway/laryngeal closure;Reduced tongue base retraction;Pharyngeal residue - valleculae;Pharyngeal residue - pyriform;Pharyngeal residue - posterior pharnyx;Penetration/Aspiration during swallow;Penetration/Apiration after swallow Pharyngeal Material enters airway, passes BELOW  cords without attempt by patient to eject out (silent aspiration) Pharyngeal- Thin Cup Reduced pharyngeal peristalsis;Reduced epiglottic inversion;Delayed swallow initiation-pyriform sinuses;Reduced anterior laryngeal mobility;Reduced airway/laryngeal closure;Reduced tongue base retraction;Pharyngeal residue - valleculae;Pharyngeal residue - pyriform;Pharyngeal residue - posterior pharnyx;Penetration/Aspiration during swallow;Penetration/Apiration after swallow;Penetration/Aspiration before swallow Pharyngeal Material enters airway, passes BELOW cords without attempt by patient to eject out (silent aspiration);Material enters  airway, CONTACTS cords and not ejected out;Material enters airway, remains ABOVE vocal cords and not ejected out Pharyngeal- Thin Straw Reduced pharyngeal peristalsis;Reduced epiglottic inversion;Delayed swallow initiation-pyriform sinuses;Reduced anterior laryngeal mobility;Reduced airway/laryngeal closure;Reduced tongue base retraction;Pharyngeal residue - valleculae;Pharyngeal residue - pyriform;Pharyngeal residue - posterior pharnyx;Penetration/Aspiration during swallow;Penetration/Apiration after swallow Pharyngeal Material enters airway, passes BELOW cords without attempt by patient to eject out (silent aspiration);Material enters airway, CONTACTS cords and not ejected out Pharyngeal- Puree Reduced pharyngeal peristalsis;Reduced epiglottic inversion;Delayed swallow initiation-pyriform sinuses;Reduced anterior laryngeal mobility;Reduced airway/laryngeal closure;Reduced tongue base retraction;Pharyngeal residue - valleculae;Pharyngeal residue - pyriform;Pharyngeal residue - posterior pharnyx;Penetration/Aspiration during swallow Pharyngeal Material enters airway, CONTACTS cords and then ejected out Pharyngeal- Mechanical Soft Reduced pharyngeal peristalsis;Reduced epiglottic inversion;Delayed swallow initiation-pyriform sinuses;Reduced anterior laryngeal mobility;Reduced airway/laryngeal closure;Reduced tongue base retraction;Pharyngeal residue - valleculae;Pharyngeal residue - pyriform;Pharyngeal residue - posterior pharnyx;Penetration/Aspiration during swallow;Penetration/Apiration after swallow Pharyngeal Material enters airway, remains ABOVE vocal cords and not ejected out    08/07/2022   1:12 PM Cervical Esophageal Phase  Cervical Esophageal Phase [facility] Shanika I. Hardin Negus, [place], [place] Office number [phone] Horton Marshall 08/07/2022, 3:46 PM                     DG Abd Portable 1V  Result Date: 08/07/2022 CLINICAL DATA:  NG tube placement. EXAM: PORTABLE ABDOMEN - 1  VIEW COMPARISON:  None Available. FINDINGS: Enteric tube is present as it courses through the stomach with tip over the right upper quadrant likely over the distal stomach or proximal duodenum. Visualized bowel gas pattern is nonobstructive. Contrast present within the large and small bowel. There is no free peritoneal air. Lung bases are clear. Mild cardiomegaly. Mild degenerative change of the spine. IMPRESSION: Enteric tube with tip over the right upper quadrant likely over the distal stomach or proximal duodenum. Electronically Signed   By: Marin Olp M.D.   On: 08/07/2022 15:17   MR BRAIN WO CONTRAST  Result Date: 08/06/2022 CLINICAL DATA:  Stroke follow-up EXAM: MRI HEAD WITHOUT CONTRAST TECHNIQUE: Multiplanar, multiecho pulse sequences of the brain and surrounding structures were obtained without intravenous contrast. COMPARISON:  None Available. FINDINGS: Brain: Multifocal acute/subacute ischemia in the left MCA territory, predominantly involving the left frontal operculum and posterior temporal lobe. There is a punctate focus of abnormal diffusion restriction within the paramedian right frontal lobe that is likely a susceptibility artifact related to falcine calcification. There is susceptibility effects in the left MCA territory from likely extravasated contrast. There is multifocal hyperintense T2-weighted signal within the white matter. Parenchymal volume and CSF spaces are normal. Small volume hemorrhage at the left external capsule (series 6, image 19), which appears to be old. Vascular: Major flow voids are preserved. Skull and upper cervical spine: Normal calvarium and skull base. Visualized upper cervical spine and soft tissues are normal. Sinuses/Orbits:No paranasal sinus fluid levels or advanced mucosal thickening. No mastoid or middle ear effusion. Normal orbits. IMPRESSION: 1. Multifocal acute/subacute ischemia in the left MCA territory, predominantly involving the left frontal operculum  and posterior temporal lobe. 2. Small volume hemorrhage at the left external capsule, favored to be old. Electronically Signed  By: Ulyses Jarred M.D.   On: 08/06/2022 18:54   ECHOCARDIOGRAM COMPLETE  Result Date: 08/06/2022    ECHOCARDIOGRAM REPORT   Patient Name:   Dustin Blanchard Date of Exam: 08/06/2022 Medical Rec #:  [id number]      Height:       72.0 in Accession #:    [id number]     Weight:       158.7 lb Date of Birth:  Nov 06, 1951       BSA:          1.931 m Patient Age:    16 years       BP:           136/90 mmHg Patient Gender: M              HR:           81 bpm. Exam Location:  Inpatient Procedure: 2D Echo, Cardiac Doppler and Color Doppler Indications:    Stroke  History:        Patient has no prior history of Echocardiogram examinations.                 Acute ischemic left MCA stroke ([place]), Dementia.  Sonographer:    Alvino Chapel RCS Referring Phys: [id number] [place]  1. Coarse trabeculation of the apex without obvious thrombus. Left ventricular ejection fraction, by estimation, is 35 to 40%. Left ventricular ejection fraction by 2D MOD biplane is 37.1 %. The left ventricle has moderately decreased function. The left  ventricle demonstrates global hypokinesis. There is mild left ventricular hypertrophy. Left ventricular diastolic parameters are consistent with Grade III diastolic dysfunction (restrictive). Elevated left ventricular end-diastolic pressure.  2. Right ventricular systolic function is low normal. The right ventricular size is normal. There is mildly elevated pulmonary artery systolic pressure. The estimated right ventricular systolic pressure is 16.9 mmHg.  3. Left atrial size was severely dilated.  4. Moderate pericardial effusion. The pericardial effusion is circumferential. There is no evidence of cardiac tamponade.  5. The mitral valve is abnormal. Moderate mitral valve regurgitation.  6. The aortic valve is tricuspid. Aortic valve regurgitation is trivial.  7.  The inferior vena cava is normal in size with greater than 50% respiratory variability, suggesting right atrial pressure of 3 mmHg. Comparison(s): No prior Echocardiogram. FINDINGS  Left Ventricle: Coarse trabeculation of the apex without obvious thrombus. Left ventricular ejection fraction, by estimation, is 35 to 40%. Left ventricular ejection fraction by 2D MOD biplane is 37.1 %. The left ventricle has moderately decreased function. The left ventricle demonstrates global hypokinesis. The left ventricular internal cavity size was normal in size. There is mild left ventricular hypertrophy. Left ventricular diastolic parameters are consistent with Grade III diastolic dysfunction (restrictive). Elevated left ventricular end-diastolic pressure. Right Ventricle: The right ventricular size is normal. No increase in right ventricular wall thickness. Right ventricular systolic function is low normal. There is mildly elevated pulmonary artery systolic pressure. The tricuspid regurgitant velocity is 3.21 m/s, and with an assumed right atrial pressure of 3 mmHg, the estimated right ventricular systolic pressure is 67.8 mmHg. Left Atrium: Left atrial size was severely dilated. Right Atrium: Right atrial size was normal in size. Pericardium: A moderately sized pericardial effusion is present. The pericardial effusion is circumferential. There is diastolic collapse of the right atrial wall and diastolic collapse of the right ventricular free wall. There is no evidence of cardiac tamponade. Mitral Valve: The mitral valve is abnormal. There is mild thickening  of the anterior and posterior mitral valve leaflet(s). Moderate mitral valve regurgitation. Tricuspid Valve: The tricuspid valve is grossly normal. Tricuspid valve regurgitation is mild. Aortic Valve: The aortic valve is tricuspid. Aortic valve regurgitation is trivial. Pulmonic Valve: The pulmonic valve was grossly normal. Pulmonic valve regurgitation is trivial. Aorta: The  aortic root and ascending aorta are structurally normal, with no evidence of dilitation. Venous: The inferior vena cava is normal in size with greater than 50% respiratory variability, suggesting right atrial pressure of 3 mmHg. IAS/Shunts: No atrial level shunt detected by color flow Doppler.  LEFT VENTRICLE PLAX 2D                        Biplane EF (MOD) LVIDd:         5.40 cm         LV Biplane EF:   Left LVIDs:         3.90 cm                          ventricular LV PW:         1.30 cm                          ejection LV IVS:        1.10 cm                          fraction by LVOT diam:     2.00 cm                          2D MOD LV SV:         47                               biplane is LV SV Index:   24                               37.1 %. LVOT Area:     3.14 cm                                Diastology                                LV e' medial:    5.98 cm/s LV Volumes (MOD)               LV E/e' medial:  15.6 LV vol d, MOD    125.5 ml      LV e' lateral:   8.49 cm/s A2C:                           LV E/e' lateral: 11.0 LV vol d, MOD    140.8 ml A4C: LV vol s, MOD    76.4 ml A2C: LV vol s, MOD    88.4 ml A4C: LV SV MOD A2C:   49.1 ml LV SV MOD A4C:   140.8 ml LV SV MOD BP:    50.3 ml RIGHT VENTRICLE RV S prime:  10.30 cm/s TAPSE (M-mode): 1.6 cm LEFT ATRIUM              Index        RIGHT ATRIUM           Index LA diam:        4.30 cm  2.23 cm/m   RA Area:     19.80 cm LA Vol (A2C):   94.0 ml  48.67 ml/m  RA Volume:   51.10 ml  26.46 ml/m LA Vol (A4C):   102.0 ml 52.82 ml/m LA Biplane Vol: 107.0 ml 55.40 ml/m  AORTIC VALVE LVOT Vmax:   75.60 cm/s LVOT Vmean:  48.600 cm/s LVOT VTI:    0.150 m  AORTA Ao Root diam: 4.10 cm MITRAL VALVE               TRICUSPID VALVE MV Area (PHT): 5.66 cm    TR Peak grad:   41.2 mmHg MV Decel Time: 134 msec    TR Vmax:        321.00 cm/s MV E velocity: 93.30 cm/s MV A velocity: 30.20 cm/s  SHUNTS MV E/A ratio:  3.09        Systemic VTI:  0.15 m                             Systemic Diam: 2.00 cm Lyman Bishop MD Electronically signed by Lyman Bishop MD Signature Date/Time: 08/06/2022/4:42:49 PM    Final    CT HEAD WO CONTRAST (5MM)  Result Date: 08/06/2022 CLINICAL DATA:  Stroke follow-up. Contrast extravasation during procedure. EXAM: CT HEAD WITHOUT CONTRAST TECHNIQUE: Contiguous axial images were obtained from the base of the skull through the vertex without intravenous contrast. RADIATION DOSE REDUCTION: This exam was performed according to the departmental dose-optimization program which includes automated exposure control, adjustment of the mA and/or kV according to patient size and/or use of iterative reconstruction technique. COMPARISON:  CT head dated 1 day prior and intra procedural images obtained earlier the same day FINDINGS: Brain: There is hyperdensity overlying the left cerebral convexity likely reflecting predominantly extravasated contrast from the recent procedure, though superimposed subarachnoid hemorrhage is difficult to entirely exclude. 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 There is no other evidence of evolving territorial infarct. 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 The ventricles are stable in size. Additional patchy hypodensity in the supratentorial white matter likely reflecting sequela of background chronic white matter microangiopathy is unchanged There is no solid mass lesion. There is no mass effect or midline shift. Vascular: There is calcification of the bilateral cavernous ICAs. Skull: Normal. Negative for fracture or focal lesion. Sinuses/Orbits: The imaged paranasal sinuses are clear. Globes and orbits are unremarkable. Other: None. IMPRESSION: 1. Density overlying the left cerebral convexity is grossly similar  to the intra procedural images obtained earlier the same day and is favored to reflect predominantly contrast extravasation; however, superimposed subarachnoid hemorrhage can not be entirely excluded. 2. Small focus of hypodensity in the left frontal lobe cortex suspicious for small evolving infarct. Electronically Signed   By: Valetta Mole M.D.   On: 08/06/2022 12:34   IR PERCUTANEOUS ART THROMBECTOMY/INFUSION INTRACRANIAL INC DIAG ANGIO  Result Date: 08/06/2022 INDICATION: 71 year old male with past medical history significant  for hypertension, hyperlipidemia, sinus bradycardia and BPH; baseline modified Rankin scale 3. He presented to outside hospital with right-sided weakness, aphasia and gaze deviation; NIHSS 18. CT angiogram of the head and neck head equivocal findings in fall EXAM: ULTRASOUND-GUIDED VASCULAR ACCESS DIAGNOSTIC CEREBRAL ANGIOGRAM MECHANICAL THROMBECTOMY FLAT PANEL HEAD CT COMPARISON:  None Available. MEDICATIONS: Ancef 2 g IV. The antibiotic was administered within 1 hour of the procedure ANESTHESIA/SEDATION: The procedure was performed under general anesthesia. CONTRAST:  100 mL of Omnipaque 300 milligram/mL FLUOROSCOPY: Radiation Exposure Index (as provided by the fluoroscopic device): 9,628 mGy Kerma COMPLICATIONS: SIR LEVEL B - Normal therapy, includes overnight admission for observation. TECHNIQUE: Informed written consent was obtained from the patient's wife after a thorough discussion of the procedural risks, benefits and alternatives. All questions were addressed. Maximal Sterile Barrier Technique was utilized including caps, mask, sterile gowns, sterile gloves, sterile drape, hand hygiene and skin antiseptic. A timeout was performed prior to the initiation of the procedure. The right groin was prepped and draped in the usual sterile fashion. Using a micropuncture kit and the modified Seldinger technique, access was gained to the right common femoral artery and a 5 French sheath was  placed. Real-time ultrasound guidance was utilized for vascular access including the acquisition of a permanent ultrasound image documenting patency of the accessed vessel. Under fluoroscopy, a 5 [place] Berenstein 2 catheter was navigated over a 0.035" Terumo Glidewire into the aortic arch. The catheter was placed into the left common carotid artery. Frontal roadmap was obtained and the catheter was then advanced over the wire into the left internal carotid artery. Frontal and lateral angiograms of the head were obtained. FINDINGS: 1. Mild atherosclerotic changes in the left common femoral artery without significant stenosis, adequate for vascular access. 2. 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 3. Hyperemia along the a patent left M3/MCA branch to the anterior parietal region with corresponding early venous opacification, suggesting ongoing ischemia. PROCEDURE: The Berenstein 2 catheter was removed over an exchange length Rosen wire and under biplane roadmap guidance. The 5 French sheath was then removed over the wire. An 8 French 25 cm femoral sheath was advanced over the wire. Then, a zoom 88 guide catheter was advanced over the wire into the upper cervical segment of the left ICA. Frontal and lateral angiograms of the head were obtained. Using biplane roadmap guidance, a zoom 55 aspiration catheter was navigated over a phenom 21 microcatheter and a Aristotle 14 microguidewire into the cavernous segment of the left ICA. The microcatheter was then navigated over the wire into a left M3/MCA middle division branch. Attempt to advance the microcatheter over the wire proved unsuccessful due to significant resistance. The microcatheter was retracted frontal and lateral angiogram of the head were obtained. Small contrast extravasation was noted. The left M3/MCA branch appear recanalized with a persistent nonocclusive filling defect. Flat panel CT of the head was obtained and post processed in a  separate workstation with concurrent attending physician supervision. Selected images were sent to PACS. Small contrast extravasation seen along the left sylvian fissure. Follow-up left internal carotid artery angiogram showed no evidence of further contrast extravasation. Delay left internal carotid artery angiogram showed stable recanalization with no evidence of further contrast extravasation. Repeat flat panel CT of the head was obtained and post processed in a separate workstation with concurrent attending physician supervision. Selected images were sent to PACS. Similar appearance of contrast extravasation in the left sylvian fissure. Repeat delay left internal carotid artery angiogram  showed stable recanalization with no evidence of further contrast extravasation. Right common femoral artery angiogram was obtained in right anterior oblique view. The puncture is at the level of the common femoral artery. The artery has normal caliber, adequate for closure device. The sheath was exchanged over the wire for a Perclose prostyle which was utilized for access closure. Immediate hemostasis was achieved. IMPRESSION: 1. 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 2. Capillary hyperemia along patent anterior left parietal M3 branch with early venous drainage suggestive of ongoing ischemia. 3. Access to occluded branch with microwire resulted in recanalization (TICI 2C) with small contrast extravasation which resolved spontaneously. Delay flat panel CT and cerebral angiogram showed no further contrast extravasation. PLAN: Repeat head CT within 3 hours to evaluate for contrast extravasation stability. Electronically Signed   By: Pedro Earls M.D.   On: 08/06/2022 11:30   IR US Guide Vasc Access Right  Result Date: 08/06/2022 INDICATION: 71 year old male with past medical history significant for hypertension, hyperlipidemia, sinus bradycardia and BPH; baseline modified Rankin  scale 3. He presented to outside hospital with right-sided weakness, aphasia and gaze deviation; NIHSS 18. CT angiogram of the head and neck head equivocal findings in fall EXAM: ULTRASOUND-GUIDED VASCULAR ACCESS DIAGNOSTIC CEREBRAL ANGIOGRAM MECHANICAL THROMBECTOMY FLAT PANEL HEAD CT COMPARISON:  None Available. MEDICATIONS: Ancef 2 g IV. The antibiotic was administered within 1 hour of the procedure ANESTHESIA/SEDATION: The procedure was performed under general anesthesia. CONTRAST:  100 mL of Omnipaque 300 milligram/mL FLUOROSCOPY: Radiation Exposure Index (as provided by the fluoroscopic device): 2,035 mGy Kerma COMPLICATIONS: SIR LEVEL B - Normal therapy, includes overnight admission for observation. TECHNIQUE: Informed written consent was obtained from the patient's wife after a thorough discussion of the procedural risks, benefits and alternatives. All questions were addressed. Maximal Sterile Barrier Technique was utilized including caps, mask, sterile gowns, sterile gloves, sterile drape, hand hygiene and skin antiseptic. A timeout was performed prior to the initiation of the procedure. The right groin was prepped and draped in the usual sterile fashion. Using a micropuncture kit and the modified Seldinger technique, access was gained to the right common femoral artery and a 5 French sheath was placed. Real-time ultrasound guidance was utilized for vascular access including the acquisition of a permanent ultrasound image documenting patency of the accessed vessel. Under fluoroscopy, a 5 [place] Berenstein 2 catheter was navigated over a 0.035" Terumo Glidewire into the aortic arch. The catheter was placed into the left common carotid artery. Frontal roadmap was obtained and the catheter was then advanced over the wire into the left internal carotid artery. Frontal and lateral angiograms of the head were obtained. FINDINGS: 1. Mild atherosclerotic changes in the left common femoral artery without significant  stenosis, adequate for vascular access. 2. 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 3. Hyperemia along the a patent left M3/MCA branch to the anterior parietal region with corresponding early venous opacification, suggesting ongoing ischemia. PROCEDURE: The Berenstein 2 catheter was removed over an exchange length Rosen wire and under biplane roadmap guidance. The 5 French sheath was then removed over the wire. An 8 French 25 cm femoral sheath was advanced over the wire. Then, a zoom 88 guide catheter was advanced over the wire into the upper cervical segment of the left ICA. Frontal and lateral angiograms of the head were obtained. Using biplane roadmap guidance, a zoom 55 aspiration catheter was navigated over a phenom 21 microcatheter and a Aristotle 14 microguidewire into the cavernous  segment of the left ICA. The microcatheter was then navigated over the wire into a left M3/MCA middle division branch. Attempt to advance the microcatheter over the wire proved unsuccessful due to significant resistance. The microcatheter was retracted frontal and lateral angiogram of the head were obtained. Small contrast extravasation was noted. The left M3/MCA branch appear recanalized with a persistent nonocclusive filling defect. Flat panel CT of the head was obtained and post processed in a separate workstation with concurrent attending physician supervision. Selected images were sent to PACS. Small contrast extravasation seen along the left sylvian fissure. Follow-up left internal carotid artery angiogram showed no evidence of further contrast extravasation. Delay left internal carotid artery angiogram showed stable recanalization with no evidence of further contrast extravasation. Repeat flat panel CT of the head was obtained and post processed in a separate workstation with concurrent attending physician supervision. Selected images were sent to PACS. Similar appearance of contrast extravasation in the left  sylvian fissure. Repeat delay left internal carotid artery angiogram showed stable recanalization with no evidence of further contrast extravasation. Right common femoral artery angiogram was obtained in right anterior oblique view. The puncture is at the level of the common femoral artery. The artery has normal caliber, adequate for closure device. The sheath was exchanged over the wire for a Perclose prostyle which was utilized for access closure. Immediate hemostasis was achieved. IMPRESSION: 1. 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 2. Capillary hyperemia along patent anterior left parietal M3 branch with early venous drainage suggestive of ongoing ischemia. 3. Access to occluded branch with microwire resulted in recanalization (TICI 2C) with small contrast extravasation which resolved spontaneously. Delay flat panel CT and cerebral angiogram showed no further contrast extravasation. PLAN: Repeat head CT within 3 hours to evaluate for contrast extravasation stability. Electronically Signed   By: Pedro Earls M.D.   On: 08/06/2022 11:30   IR CT Head Ltd  Result Date: 08/06/2022 INDICATION: 71 year old male with past medical history significant for hypertension, hyperlipidemia, sinus bradycardia and BPH; baseline modified Rankin scale 3. He presented to outside hospital with right-sided weakness, aphasia and gaze deviation; NIHSS 18. CT angiogram of the head and neck head equivocal findings in fall EXAM: ULTRASOUND-GUIDED VASCULAR ACCESS DIAGNOSTIC CEREBRAL ANGIOGRAM MECHANICAL THROMBECTOMY FLAT PANEL HEAD CT COMPARISON:  None Available. MEDICATIONS: Ancef 2 g IV. The antibiotic was administered within 1 hour of the procedure ANESTHESIA/SEDATION: The procedure was performed under general anesthesia. CONTRAST:  100 mL of Omnipaque 300 milligram/mL FLUOROSCOPY: Radiation Exposure Index (as provided by the fluoroscopic device): 0,932 mGy Kerma COMPLICATIONS: SIR LEVEL B  - Normal therapy, includes overnight admission for observation. TECHNIQUE: Informed written consent was obtained from the patient's wife after a thorough discussion of the procedural risks, benefits and alternatives. All questions were addressed. Maximal Sterile Barrier Technique was utilized including caps, mask, sterile gowns, sterile gloves, sterile drape, hand hygiene and skin antiseptic. A timeout was performed prior to the initiation of the procedure. The right groin was prepped and draped in the usual sterile fashion. Using a micropuncture kit and the modified Seldinger technique, access was gained to the right common femoral artery and a 5 French sheath was placed. Real-time ultrasound guidance was utilized for vascular access including the acquisition of a permanent ultrasound image documenting patency of the accessed vessel. Under fluoroscopy, a 5 [place] Berenstein 2 catheter was navigated over a 0.035" Terumo Glidewire into the aortic arch. The catheter was placed into the left common carotid artery. Frontal  roadmap was obtained and the catheter was then advanced over the wire into the left internal carotid artery. Frontal and lateral angiograms of the head were obtained. FINDINGS: 1. Mild atherosclerotic changes in the left common femoral artery without significant stenosis, adequate for vascular access. 2. 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 3. Hyperemia along the a patent left M3/MCA branch to the anterior parietal region with corresponding early venous opacification, suggesting ongoing ischemia. PROCEDURE: The Berenstein 2 catheter was removed over an exchange length Rosen wire and under biplane roadmap guidance. The 5 French sheath was then removed over the wire. An 8 French 25 cm femoral sheath was advanced over the wire. Then, a zoom 88 guide catheter was advanced over the wire into the upper cervical segment of the left ICA. Frontal and lateral angiograms of the head were  obtained. Using biplane roadmap guidance, a zoom 55 aspiration catheter was navigated over a phenom 21 microcatheter and a Aristotle 14 microguidewire into the cavernous segment of the left ICA. The microcatheter was then navigated over the wire into a left M3/MCA middle division branch. Attempt to advance the microcatheter over the wire proved unsuccessful due to significant resistance. The microcatheter was retracted frontal and lateral angiogram of the head were obtained. Small contrast extravasation was noted. The left M3/MCA branch appear recanalized with a persistent nonocclusive filling defect. Flat panel CT of the head was obtained and post processed in a separate workstation with concurrent attending physician supervision. Selected images were sent to PACS. Small contrast extravasation seen along the left sylvian fissure. Follow-up left internal carotid artery angiogram showed no evidence of further contrast extravasation. Delay left internal carotid artery angiogram showed stable recanalization with no evidence of further contrast extravasation. Repeat flat panel CT of the head was obtained and post processed in a separate workstation with concurrent attending physician supervision. Selected images were sent to PACS. Similar appearance of contrast extravasation in the left sylvian fissure. Repeat delay left internal carotid artery angiogram showed stable recanalization with no evidence of further contrast extravasation. Right common femoral artery angiogram was obtained in right anterior oblique view. The puncture is at the level of the common femoral artery. The artery has normal caliber, adequate for closure device. The sheath was exchanged over the wire for a Perclose prostyle which was utilized for access closure. Immediate hemostasis was achieved. IMPRESSION: 1. 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 2. Capillary hyperemia along patent anterior left parietal M3 branch with  early venous drainage suggestive of ongoing ischemia. 3. Access to occluded branch with microwire resulted in recanalization (TICI 2C) with small contrast extravasation which resolved spontaneously. Delay flat panel CT and cerebral angiogram showed no further contrast extravasation. PLAN: Repeat head CT within 3 hours to evaluate for contrast extravasation stability. Electronically Signed   By: Pedro Earls M.D.   On: 08/06/2022 11:30   IR CT Head Ltd  Result Date: 08/06/2022 INDICATION: 71 year old male with past medical history significant for hypertension, hyperlipidemia, sinus bradycardia and BPH; baseline modified Rankin scale 3. He presented to outside hospital with right-sided weakness, aphasia and gaze deviation; NIHSS 18. CT angiogram of the head and neck head equivocal findings in fall EXAM: ULTRASOUND-GUIDED VASCULAR ACCESS DIAGNOSTIC CEREBRAL ANGIOGRAM MECHANICAL THROMBECTOMY FLAT PANEL HEAD CT COMPARISON:  None Available. MEDICATIONS: Ancef 2 g IV. The antibiotic was administered within 1 hour of the procedure ANESTHESIA/SEDATION: The procedure was performed under general anesthesia. CONTRAST:  100 mL of Omnipaque 300 milligram/mL  FLUOROSCOPY: Radiation Exposure Index (as provided by the fluoroscopic device): 4,193 mGy Kerma COMPLICATIONS: SIR LEVEL B - Normal therapy, includes overnight admission for observation. TECHNIQUE: Informed written consent was obtained from the patient's wife after a thorough discussion of the procedural risks, benefits and alternatives. All questions were addressed. Maximal Sterile Barrier Technique was utilized including caps, mask, sterile gowns, sterile gloves, sterile drape, hand hygiene and skin antiseptic. A timeout was performed prior to the initiation of the procedure. The right groin was prepped and draped in the usual sterile fashion. Using a micropuncture kit and the modified Seldinger technique, access was gained to the right common femoral  artery and a 5 French sheath was placed. Real-time ultrasound guidance was utilized for vascular access including the acquisition of a permanent ultrasound image documenting patency of the accessed vessel. Under fluoroscopy, a 5 [place] Berenstein 2 catheter was navigated over a 0.035" Terumo Glidewire into the aortic arch. The catheter was placed into the left common carotid artery. Frontal roadmap was obtained and the catheter was then advanced over the wire into the left internal carotid artery. Frontal and lateral angiograms of the head were obtained. FINDINGS: 1. Mild atherosclerotic changes in the left common femoral artery without significant stenosis, adequate for vascular access. 2. 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 3. Hyperemia along the a patent left M3/MCA branch to the anterior parietal region with corresponding early venous opacification, suggesting ongoing ischemia. PROCEDURE: The Berenstein 2 catheter was removed over an exchange length Rosen wire and under biplane roadmap guidance. The 5 French sheath was then removed over the wire. An 8 French 25 cm femoral sheath was advanced over the wire. Then, a zoom 88 guide catheter was advanced over the wire into the upper cervical segment of the left ICA. Frontal and lateral angiograms of the head were obtained. Using biplane roadmap guidance, a zoom 55 aspiration catheter was navigated over a phenom 21 microcatheter and a Aristotle 14 microguidewire into the cavernous segment of the left ICA. The microcatheter was then navigated over the wire into a left M3/MCA middle division branch. Attempt to advance the microcatheter over the wire proved unsuccessful due to significant resistance. The microcatheter was retracted frontal and lateral angiogram of the head were obtained. Small contrast extravasation was noted. The left M3/MCA branch appear recanalized with a persistent nonocclusive filling defect. Flat panel CT of the head was  obtained and post processed in a separate workstation with concurrent attending physician supervision. Selected images were sent to PACS. Small contrast extravasation seen along the left sylvian fissure. Follow-up left internal carotid artery angiogram showed no evidence of further contrast extravasation. Delay left internal carotid artery angiogram showed stable recanalization with no evidence of further contrast extravasation. Repeat flat panel CT of the head was obtained and post processed in a separate workstation with concurrent attending physician supervision. Selected images were sent to PACS. Similar appearance of contrast extravasation in the left sylvian fissure. Repeat delay left internal carotid artery angiogram showed stable recanalization with no evidence of further contrast extravasation. Right common femoral artery angiogram was obtained in right anterior oblique view. The puncture is at the level of the common femoral artery. The artery has normal caliber, adequate for closure device. The sheath was exchanged over the wire for a Perclose prostyle which was utilized for access closure. Immediate hemostasis was achieved. IMPRESSION: 1. 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 2. Capillary hyperemia along patent anterior left parietal M3 branch with early  venous drainage suggestive of ongoing ischemia. 3. Access to occluded branch with microwire resulted in recanalization (TICI 2C) with small contrast extravasation which resolved spontaneously. Delay flat panel CT and cerebral angiogram showed no further contrast extravasation. PLAN: Repeat head CT within 3 hours to evaluate for contrast extravasation stability. Electronically Signed   By: Pedro Earls M.D.   On: 08/06/2022 11:30   CT ANGIO HEAD NECK W WO CM W PERF (CODE STROKE)  Result Date: 08/06/2022 CLINICAL DATA:  71 year old male code stroke presentation, left MCA symptomatology. EXAM: CT ANGIOGRAPHY  HEAD AND NECK CT PERFUSION BRAIN TECHNIQUE: Multidetector CT imaging of the head and neck was performed using the standard protocol during bolus administration of intravenous contrast. Multiplanar CT image reconstructions and MIPs were obtained to evaluate the vascular anatomy. Carotid stenosis measurements (when applicable) are obtained utilizing NASCET criteria, using the distal internal carotid diameter as the denominator. Multiphase CT imaging of the brain was performed following IV bolus contrast injection. Subsequent parametric perfusion maps were calculated using RAPID software. RADIATION DOSE REDUCTION: This exam was performed according to the departmental dose-optimization program which includes automated exposure control, adjustment of the mA and/or kV according to patient size and/or use of iterative reconstruction technique. CONTRAST:  123m OMNIPAQUE IOHEXOL 350 MG/ML SOLN COMPARISON:  Plain head CT 0449 hours today. CT cervical spine reported separately. FINDINGS: CT Brain Perfusion Findings: ASPECTS: Ten CBF (<30%) Volume: 034m CBV parameter changes of 13-16 mL at the left operculum. The Perfusion (Tmax>6.0s) volume: 12364m Hypoperfusion index 0.2. Mismatch Volume: 123m35mfarction Location:Left MCA territory CTA NECK Skeleton: Cervical spine CT detailed separately. No acute osseous abnormality identified. Upper chest: Mild dependent atelectasis. Mild retained secretions in the upper thoracic esophagus. Other neck: Left thyroid nodules smaller than 1.5 cm Not clinically significant; no follow-up imaging recommended (ref: J Am Coll Radiol. 2015 Feb;12(2): 143-50).Otherwise negative. Aortic arch: Calcified aortic atherosclerosis. 3 vessel arch configuration. Right carotid system: Minimal plaque. Mild motion artifact distal to the right ICA bulb. No stenosis. Left carotid system: Mild tortuosity and minimal plaque. No stenosis. Vertebral arteries: Minimal plaque except at the left vertebral artery  origin and V1 segment (series 7, image 290) where there is mild stenosis. No other stenosis along the course of both vertebral arteries to the skull base. CTA HEAD Posterior circulation: Moderate left vertebral artery soft plaque and stenosis series 7, image 155. This is proximal to the left PICA origin which remains patent. Patent vertebrobasilar junction. No distal right vertebral stenosis. Patent basilar artery without stenosis. Patent SCA and PCA origins. Posterior communicating arteries are diminutive or absent. Bilateral PCA branches appear fairly symmetric and within normal limits. No proximal left PCA occlusion. Anterior circulation: Bilateral ICA siphon atherosclerosis, with up to moderate plaque. Mild to moderate supraclinoid left ICA stenosis series 9, image 101. Contralateral right ICA siphon stenosis. Patent carotid termini. Patent MCA and ACA origins. Anterior communicating artery and bilateral ACA branches are within normal limits. Median artery of the corpus callosum (normal variant). Right MCA M1 segment and trifurcation are patent without stenosis. Right MCA branches are within normal limits. Left MCA M1 segment and left MCA trifurcation are patent. No left MCA M2 branch occlusion is identified. However, there is evidence of middle division M3 branch occlusion on series 12, image 26, corresponding to the level of the operculum. Venous sinuses: Grossly patent. Anatomic variants: Median artery of the corpus callosum. Review of the MIP images confirms the above findings Preliminary report of the above discussed  by telephone with Dr. Joseph Berkshire on 08/06/2022 at 05:22 . IMPRESSION: 1. CT Perfusion is positive for left MCA territory oligemia, proximally 120 mL. No infarct core detected by strict criteria, but abnormal CBV at the left operculum. 2. No large vessel occlusion identified on CTA, but left M3 operculum branch occlusion(s) suspected. Still, given the CTP findings and presentation  recommend discussion with Neurology for consideration of NIR intervention. 3. The above was discussed by telephone with Dr. Joseph Berkshire in the ED on 08/06/2022 at 0522 hours, and also Dr. Curly Shores at [phone] hours. 4. 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 Bilateral ICA siphon plaque with up to moderate Left supraclinoid stenosis. Moderate Left Vertebral V4 stenosis. No proximal Left PCA branch occlusion (small age indeterminate left occipital pole infarct on plain CT). Electronically Signed   By: Genevie Ann M.D.   On: 08/06/2022 05:34   CT Cervical Spine Wo Contrast  Result Date: 08/06/2022 CLINICAL DATA:  71 year old male code stroke patient. Right side weakness. EXAM: CT CERVICAL SPINE WITHOUT CONTRAST TECHNIQUE: Multidetector CT imaging of the cervical spine was performed without intravenous contrast. Multiplanar CT image reconstructions were also generated. RADIATION DOSE REDUCTION: This exam was performed according to the departmental dose-optimization program which includes automated exposure control, adjustment of the mA and/or kV according to patient size and/or use of iterative reconstruction technique. COMPARISON:  Plain head CT the same time reported separately. FINDINGS: Alignment: Straightening of cervical lordosis. Cervicothoracic junction alignment is within normal limits. Bilateral posterior element alignment is within normal limits. Skull base and vertebrae: Visualized skull base is intact. No atlanto-occipital dissociation. C1 and C2 appear intact and aligned. No acute osseous abnormality identified. Soft tissues and spinal canal: No prevertebral fluid or swelling. No visible canal hematoma. Elongated stylohyoid ligament calcification greater on the left. Disc levels: Widespread advanced cervical spine degeneration. Multilevel mild and up to moderate cervical spinal stenosis (series 9, image 31). Upper chest: Mild apical lung scarring. Chronic appearing mild T1  superior endplate deformity, Schmorl's node. Other: 14 mm left thyroid nodule, Not clinically significant; no follow-up imaging recommended (ref: J Am Coll Radiol. 2015 Feb;12(2): 143-50). IMPRESSION: 1. No acute traumatic injury identified in the cervical spine. 2. 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 Electronically Signed   By: Genevie Ann M.D.   On: 08/06/2022 05:19   CT HEAD CODE STROKE WO CONTRAST  Result Date: 08/06/2022 CLINICAL DATA:  Code stroke. 71 year old male. Fall at 0300 hours. Right side weakness and abnormal speech. EXAM: CT HEAD WITHOUT CONTRAST TECHNIQUE: Contiguous axial images were obtained from the base of the skull through the vertex without intravenous contrast. RADIATION DOSE REDUCTION: This exam was performed according to the departmental dose-optimization program which includes automated exposure control, adjustment of the mA and/or kV according to patient size and/or use of iterative reconstruction technique. COMPARISON:  None Available. FINDINGS: Brain: Cerebral volume is within normal limits for age. No midline shift, mass effect, or evidence of intracranial mass lesion. No ventriculomegaly. No acute intracranial hemorrhage identified. Patchy and confluent bilateral cerebral white matter hypodensity, fairly symmetric. No cortically based acute infarct identified in the left MCA territory. But there is confluent hypodensity in the left occipital pole in a small area on series 5, image 16 (about 15-20 mm). Vascular: Calcified atherosclerosis at the skull base. No suspicious intracranial vascular hyperdensity. Skull: No acute osseous abnormality identified. Sinuses/Orbits: Visualized paranasal sinuses and mastoids are clear. Other: Leftward gaze. Scalp soft tissues appear within  normal limits. ASPECTS [facility] Stroke Program Early CT Score) Total score (0-10 with 10 being normal): 10 (cytotoxic edema only in the left PCA  territory). IMPRESSION: 1. Leftward gaze deviation, raising the possibility of cortical ischemia in this setting. No Left MCA territory cortical infarct changes are identified. But there is a small age indeterminate Left PCA infarct at the left occipital pole. 2. No acute intracranial hemorrhage identified. ASPECTS 10. 3. Advanced bilateral white matter changes most commonly due to small vessel disease. Study discussed by telephone with Dr. Joseph Berkshire on 08/06/2022 at 05:11 . Electronically Signed   By: Genevie Ann M.D.   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  I would recommend initiation of amiodarone to try to maintain the patient in sinus rhythm at least in the short-term.  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 Stroke -the patient will require significant physical therapy.  With regard to systemic anticoagulation, I will defer to the neurology team.  When it is considered safe to reinitiate anticoagulation, I would do so with Eliquis as he is still at risk for additional atrial arrhythmias especially atrial fibrillation.  He has documented atrial flutter so he will have an indication for systemic anticoagulation lifelong. Sinus node dysfunction -hopefully with maintenance of sinus rhythm, he will not require permanent pacemaker insertion.   For questions or updates, please contact [place] Please consult [email or web address] for contact info under    Signed, Cristopher Peru, MD  08/09/2022 10:41 AM

## 2022-08-09 NOTE — Progress Notes (Addendum)
STROKE TEAM PROGRESS NOTE   INTERVAL HISTORY His wife and son are at the bedside. Stable from neuro standpoint. Sundowning and agitation improved with Depakote and Seroquel. Tol TF well. Appreciate Cardiology consult, may need pacer. OK to transfer to neuro floor w/tele.   Vitals:   08/09/22 0300 08/09/22 0400 08/09/22 0500 08/09/22 0700  BP: 129/82 129/79 133/84 129/88  Pulse: 64 61 65 72  Resp: 18 17 17  (!) 21  Temp:  98.1 F (36.7 C)    TempSrc:  Axillary    SpO2: 96% 96% 97% 92%  Weight:   64.8 kg   Height:       CBC:  Recent Labs  Lab 08/06/22 0437  WBC 4.2  NEUTROABS 2.7  HGB 13.7  HCT 40.5  MCV 96.0  PLT 210    Basic Metabolic Panel:  Recent Labs  Lab 08/08/22 0449 08/08/22 2011 08/09/22 0603  NA 141  --  138  K 3.8  --  3.8  CL 111  --  112*  CO2 23  --  21*  GLUCOSE 113*  --  132*  BUN 12  --  14  CREATININE 0.78  --  0.67  CALCIUM 8.7*  --  8.4*  MG 2.1 2.2 2.1  PHOS 2.7 1.9* 2.1*    Lipid Panel:  Recent Labs  Lab 08/07/22 0241  CHOL 130  TRIG 60  HDL 47  CHOLHDL 2.8  VLDL 12  LDLCALC 71    HgbA1c:  Recent Labs  Lab 08/06/22 1037  HGBA1C 4.6*    Urine Drug Screen:  Recent Labs  Lab 08/06/22 0948  LABOPIA NONE DETECTED  COCAINSCRNUR NONE DETECTED  LABBENZ NONE DETECTED  AMPHETMU NONE DETECTED  THCU NONE DETECTED  LABBARB NONE DETECTED     Alcohol Level  Recent Labs  Lab 08/06/22 0437  ETH <10    PHYSICAL EXAM: General - elderly black man in mild distress. Seen in bed with restraints on d/t difficult to redirect d/t global aphasia HEENT- normocephalic, hoh,  Resp- no sob, lungs diminished, no crackles. wet cough Cardiovascular - Regular rhythm and rate. Skin- warm, dry, intact  NEURO: Mental Status - Awake, alert, but poor attention. Globally aphasic, no understandable speech and cannot follow verbal commands alone. With coaching and mimicking, he can do some simple commands, but it is difficult to keep his attention  to complete task.  CN: PERRL. Gaze is conjugate, left preference. Cannot get to follow commands to fully test VF or EOM. Does track at times and able to cross midline briefly. Right facial droop.  Motor/sensory: Right arm drifts down, able to better hold right leg, but still with drift. Withdraws to pain slower on the right, difficult to test fully d/t lack of communication. Gait and Station - deferred.  ASSESSMENT/PLAN Dustin Blanchard is a 71 y.o. male with a past medical history significant for dementia, hypertension, hyperlipidemia, sinus bradycardia, BPH. He presented on 8/17 during early am hours when his wife heard him on the floor in his urine. He was treated with IV TNK and thrombectomy with TICI2c restoration of flow.   Stroke: left MCA patchy infarct and a punctate right ACA infarct due to left M3 occlusion s/p TNK and IR with TICI2c,  embolic source likely d/t new diagnosed Afib CT showed no acute abnormality, old left occipital pole infarct.  Status post TNK.   CTA head and neck showed left M3 occlusion, bilateral siphon atherosclerosis with left moderate stenosis, left V4 stenosis.  CTP 0/123.   Status post IR with left M3 TICI2c reperfusion.   Repeat CT showed left frontal infarct without significant hemorrhage.   MRI showed left MCA patchy infarct with mild hemorrhagic conversion and a punctate right ACA infarct.   EF 35 to 40%. Severe LA dilation, LDL 71 A1c 4.6  UDS negative.  VTE prophylaxis -Lovenox No antithrombotic prior to admission, now on ASA 81mg  due to mild HTN and moderate sized infarct, but will plan to start Eliquis tomorrow if cardiology no further procedure needed. Therapy recommendations:  CIR Disposition:  pending   New Afib RVR Cardiomyopathy Tachybradycardia syndrome Cardiology and EP consulted  Recommend amiodarone for now.  May consider pacemaker later Defer cardiac ischemic evaluation for now, but may consider GDMT later Now on aspirin 81, plan to  start Eliquis tomorrow if no further procedure needed.  Hypertension Home meds:  none  Stable Did req Cleviprex gtt, now off Long-term BP goal normotensive   Hyperlipidemia Home meds: atorvastatin 20 LDL 71, goal < 70 Now on Lipitor 40 Continue statin at discharge  Dysphagia Did not pass swallow Has core track placed On tube feeding Speech on board   Cognitive impairment Delirium/sundowning Family reported cognitive impairment at baseline In-hospital delirium/sundowning On Seroquel 25 nightly and and 12.5mg  PRN agitation On Depakote 250 every 8h Close monitoring  Other Stroke Risk Factors Advanced Age >/= 70    Other Active Problems BPH  Hospital day # 3  Desiree Metzger-Cihelka, ARNP-C, ANVP-BC Pager: [phone]   ATTENDING NOTE: I reviewed above note and agree with the assessment and plan. Pt was seen and examined.   Wife, son and daughter at bedside.  Patient no acute event overnight, neuro unchanged.  Per family, he did not sleep very well last night, still on Posey belt.  Telemetry showed NSR and heart rate 70s, not in A-fib.  Cardiology on board, recommend amiodarone first.  Also recommend anticoagulation with Eliquis.  We will plan to start tomorrow given his moderate-sized infarct and mild HT.  LDL 71, increased Lipitor to 40.  Continue Seroquel and Depakote.  PT and OT recommended CIR.  For detailed assessment and plan, please refer to above/below as I have made changes wherever appropriate.   [phone], MD PhD Stroke Neurology 08/09/2022 1:05 PM  This patient is critically ill due to left MCA stroke, A-fib RVR, bradycardia tacky syndrome, cardiomyopathy, dysphagia, delirium and at significant risk of neurological worsening, death form recurrent stroke, hemorrhagic transmission, heart failure, seizure. This patient's care requires constant monitoring of vital signs, hemodynamics, respiratory and cardiac monitoring, review of multiple databases,  neurological assessment, discussion with family, other specialists and medical decision making of high complexity. I spent 40 minutes of neurocritical care time in the care of this patient. I had long discussion with wife and children at bedside, updated pt current condition, treatment plan and potential prognosis, and answered all the questions.  They expressed understanding and appreciation.      To contact Stroke Continuity provider, please refer to 08/11/2022. After hours, contact General Neurology

## 2022-08-10 ENCOUNTER — Inpatient Hospital Stay (HOSPITAL_COMMUNITY): Payer: Medicare HMO

## 2022-08-10 ENCOUNTER — Other Ambulatory Visit (HOSPITAL_COMMUNITY): Payer: Self-pay

## 2022-08-10 ENCOUNTER — Telehealth (HOSPITAL_COMMUNITY): Payer: Self-pay | Admitting: Pharmacy Technician

## 2022-08-10 DIAGNOSIS — I4891 Unspecified atrial fibrillation: Secondary | ICD-10-CM | POA: Diagnosis not present

## 2022-08-10 DIAGNOSIS — I63512 Cerebral infarction due to unspecified occlusion or stenosis of left middle cerebral artery: Secondary | ICD-10-CM | POA: Diagnosis not present

## 2022-08-10 DIAGNOSIS — I495 Sick sinus syndrome: Secondary | ICD-10-CM | POA: Diagnosis not present

## 2022-08-10 DIAGNOSIS — I5041 Acute combined systolic (congestive) and diastolic (congestive) heart failure: Secondary | ICD-10-CM | POA: Diagnosis not present

## 2022-08-10 LAB — COMPREHENSIVE METABOLIC PANEL
ALT: 13 U/L (ref 0–44)
AST: 16 U/L (ref 15–41)
Albumin: 3.1 g/dL — ABNORMAL LOW (ref 3.5–5.0)
Alkaline Phosphatase: 58 U/L (ref 38–126)
Anion gap: 7 (ref 5–15)
BUN: 12 mg/dL (ref 8–23)
CO2: 24 mmol/L (ref 22–32)
Calcium: 8.9 mg/dL (ref 8.9–10.3)
Chloride: 110 mmol/L (ref 98–111)
Creatinine, Ser: 0.78 mg/dL (ref 0.61–1.24)
GFR, Estimated: >60 mL/min (ref >60)
Glucose, Bld: 97 mg/dL (ref 70–99)
Potassium: 4.3 mmol/L (ref 3.5–5.1)
Sodium: 141 mmol/L (ref 135–145)
Total Bilirubin: 1.1 mg/dL (ref 0.3–1.2)
Total Protein: 6 g/dL — ABNORMAL LOW (ref 6.5–8.1)

## 2022-08-10 LAB — MAGNESIUM: Magnesium: 2.1 mg/dL (ref 1.7–2.4)

## 2022-08-10 LAB — GLUCOSE, CAPILLARY
Glucose-Capillary: 107 mg/dL — ABNORMAL HIGH (ref 70–99)
Glucose-Capillary: 91 mg/dL (ref 70–99)
Glucose-Capillary: 91 mg/dL (ref 70–99)
Glucose-Capillary: 148 mg/dL — ABNORMAL HIGH (ref 70–99)

## 2022-08-10 MED ORDER — APIXABAN 5 MG PO TABS
5.0000 mg | ORAL_TABLET | Freq: Two times a day (BID) | ORAL | Status: DC
  Administered 2022-08-10 – 2022-08-12 (×4): 5 mg
  Filled 2022-08-10 (×4): qty 1

## 2022-08-10 MED ORDER — APIXABAN 5 MG PO TABS
5.0000 mg | ORAL_TABLET | Freq: Two times a day (BID) | ORAL | Status: DC
Start: 2022-08-10 — End: 2022-08-10
  Administered 2022-08-10: 5 mg via ORAL
  Filled 2022-08-10: qty 1

## 2022-08-10 NOTE — Progress Notes (Signed)
Progress Note  Patient Name: Dustin Blanchard Date of Encounter: 08/10/2022  Primary Cardiologist: Donato Heinz, MD   Subjective   Overnight still having swallowing issues. Patent tracks, wife is at bedside. Has global aphasia  Inpatient Medications    Scheduled Meds:  aspirin  300 mg Rectal Daily   Or   aspirin  81 mg Per Tube Daily   atorvastatin  40 mg Per Tube Daily   Chlorhexidine Gluconate Cloth  6 each Topical Daily   enoxaparin (LOVENOX) injection  40 mg Subcutaneous Q24H   feeding supplement (PROSource TF20)  60 mL Per Tube BID   labetalol  20 mg Intravenous Once   multivitamin with minerals  1 tablet Per Tube Daily   pantoprazole  40 mg Per Tube QHS   QUEtiapine  25 mg Per Tube Q supper   valproic acid  250 mg Per Tube TID AC   Continuous Infusions:  sodium chloride 15 mL/hr at 08/10/22 0600   amiodarone 30 mg/hr (08/10/22 0600)   clevidipine Stopped (08/06/22 1941)   diltiazem (CARDIZEM) infusion Stopped (08/07/22 1754)   feeding supplement (OSMOLITE 1.5 CAL) 50 mL/hr at 08/10/22 0600   PRN Meds: acetaminophen **OR** acetaminophen (TYLENOL) oral liquid 160 mg/5 mL **OR** acetaminophen, mouth rinse, QUEtiapine, senna-docusate   Vital Signs    Vitals:   08/10/22 0400 08/10/22 0500 08/10/22 0600 08/10/22 0700  BP: 128/87 131/86 122/79 133/78  Pulse: 62 (!) 58 (!) 55 (!) 58  Resp: 19 17 19 20   Temp: 98.4 F (36.9 C)     TempSrc: Axillary     SpO2: 98% 97% 96% 94%  Weight:      Height:        Intake/Output Summary (Last 24 hours) at 08/10/2022 0728 Last data filed at 08/10/2022 0600 Gross per 24 hour  Intake 1982.75 ml  Output 500 ml  Net 1482.75 ml   Filed Weights   08/08/22 0333 08/09/22 0500 08/10/22 0359  Weight: 66 kg 64.8 kg 66.2 kg    Telemetry    Sinus bradycardia with frequent PVCs - Personally Reviewed  Physical Exam   Gen: no distress   Neck: No JVD Cardiac: No Rubs or Gallops, no Murmur, regular  bradycardoa Respiratory: Clear to auscultation bilaterally, normal effort, normal  respiratory rate GI: Soft, nontender, non-distended  MS: No  edema Integument: Skin feels warm Neuro:  Hands in mittens, tracks, global phasia  Labs    Chemistry Recent Labs  Lab 08/07/22 0241 08/08/22 0449 08/09/22 0603  NA 142 141 138  K 4.1 3.8 3.8  CL 114* 111 112*  CO2 18* 23 21*  GLUCOSE 91 113* 132*  BUN 6* 12 14  CREATININE 0.94 0.78 0.67  CALCIUM 8.6* 8.7* 8.4*  PROT 5.7* 5.2* 5.3*  ALBUMIN 3.2* 3.0* 3.0*  AST 22 16 15   ALT 11 10 10   ALKPHOS 58 53 55  BILITOT 0.8 0.9 1.0  GFRNONAA >60 >60 >60  ANIONGAP 10 7 5      Hematology Recent Labs  Lab 08/06/22 0437  WBC 4.2  RBC 4.22  HGB 13.7  HCT 40.5  MCV 96.0  MCH 32.5  MCHC 33.8  RDW 12.3  PLT 210    Cardiac EnzymesNo results for input(s): "TROPONINI" in the last 168 hours. No results for input(s): "TROPIPOC" in the last 168 hours.   BNPNo results for input(s): "BNP", "PROBNP" in the last 168 hours.   DDimer No results for input(s): "DDIMER" in the last 168 hours.  Radiology    No results found.  Cardiac Studies   ECHO COMPLETE WO IMAGING ENHANCING AGENT 08/06/2022  Narrative ECHOCARDIOGRAM REPORT    Patient Name:   Dustin Blanchard Date of Exam: 08/06/2022 Medical Rec #:  [id number]      Height:       72.0 in Accession #:    [id number]     Weight:       158.7 lb Date of Birth:  Aug 17, 1951       BSA:          1.931 m Patient Age:    71 years       BP:           136/90 mmHg Patient Gender: M              HR:           81 bpm. Exam Location:  Inpatient  Procedure: 2D Echo, Cardiac Doppler and Color Doppler  Indications:    Stroke  History:        Patient has no prior history of Echocardiogram examinations. Acute ischemic left MCA stroke (HCC), Dementia.  Sonographer:    Celesta Gentile RCS Referring Phys: [id number] SRISHTI L BHAGAT  IMPRESSIONS   1. Coarse trabeculation of the apex without obvious  thrombus. Left ventricular ejection fraction, by estimation, is 35 to 40%. Left ventricular ejection fraction by 2D MOD biplane is 37.1 %. The left ventricle has moderately decreased function. The left ventricle demonstrates global hypokinesis. There is mild left ventricular hypertrophy. Left ventricular diastolic parameters are consistent with Grade III diastolic dysfunction (restrictive). Elevated left ventricular end-diastolic pressure. 2. Right ventricular systolic function is low normal. The right ventricular size is normal. There is mildly elevated pulmonary artery systolic pressure. The estimated right ventricular systolic pressure is 44.2 mmHg. 3. Left atrial size was severely dilated. 4. Moderate pericardial effusion. The pericardial effusion is circumferential. There is no evidence of cardiac tamponade. 5. The mitral valve is abnormal. Moderate mitral valve regurgitation. 6. The aortic valve is tricuspid. Aortic valve regurgitation is trivial. 7. The inferior vena cava is normal in size with greater than 50% respiratory variability, suggesting right atrial pressure of 3 mmHg.  Comparison(s): No prior Echocardiogram.  FINDINGS Left Ventricle: Coarse trabeculation of the apex without obvious thrombus. Left ventricular ejection fraction, by estimation, is 35 to 40%. Left ventricular ejection fraction by 2D MOD biplane is 37.1 %. The left ventricle has moderately decreased function. The left ventricle demonstrates global hypokinesis. The left ventricular internal cavity size was normal in size. There is mild left ventricular hypertrophy. Left ventricular diastolic parameters are consistent with Grade III diastolic dysfunction (restrictive). Elevated left ventricular end-diastolic pressure.  Right Ventricle: The right ventricular size is normal. No increase in right ventricular wall thickness. Right ventricular systolic function is low normal. There is mildly elevated pulmonary artery systolic  pressure. The tricuspid regurgitant velocity is 3.21 m/s, and with an assumed right atrial pressure of 3 mmHg, the estimated right ventricular systolic pressure is 44.2 mmHg.  Left Atrium: Left atrial size was severely dilated.  Right Atrium: Right atrial size was normal in size.  Pericardium: A moderately sized pericardial effusion is present. The pericardial effusion is circumferential. There is diastolic collapse of the right atrial wall and diastolic collapse of the right ventricular free wall. There is no evidence of cardiac tamponade.  Mitral Valve: The mitral valve is abnormal. There is mild thickening of the anterior and posterior mitral valve leaflet(s). Moderate mitral valve  regurgitation.  Tricuspid Valve: The tricuspid valve is grossly normal. Tricuspid valve regurgitation is mild.  Aortic Valve: The aortic valve is tricuspid. Aortic valve regurgitation is trivial.  Pulmonic Valve: The pulmonic valve was grossly normal. Pulmonic valve regurgitation is trivial.  Aorta: The aortic root and ascending aorta are structurally normal, with no evidence of dilitation.  Venous: The inferior vena cava is normal in size with greater than 50% respiratory variability, suggesting right atrial pressure of 3 mmHg.  IAS/Shunts: No atrial level shunt detected by color flow Doppler.   LEFT VENTRICLE PLAX 2D                        Biplane EF (MOD) LVIDd:         5.40 cm         LV Biplane EF:   Left LVIDs:         3.90 cm                          ventricular LV PW:         1.30 cm                          ejection LV IVS:        1.10 cm                          fraction by LVOT diam:     2.00 cm                          2D MOD LV SV:         47                               biplane is LV SV Index:   24                               37.1 %. LVOT Area:     3.14 cm Diastology LV e' medial:    5.98 cm/s LV Volumes (MOD)               LV E/e' medial:  15.6 LV vol d, MOD    125.5 ml      LV  e' lateral:   8.49 cm/s A2C:                           LV E/e' lateral: 11.0 LV vol d, MOD    140.8 ml A4C: LV vol s, MOD    76.4 ml A2C: LV vol s, MOD    88.4 ml A4C: LV SV MOD A2C:   49.1 ml LV SV MOD A4C:   140.8 ml LV SV MOD BP:    50.3 ml  RIGHT VENTRICLE RV S prime:     10.30 cm/s TAPSE (M-mode): 1.6 cm  LEFT ATRIUM              Index        RIGHT ATRIUM           Index LA diam:        4.30 cm  2.23 cm/m   RA Area:  19.80 cm LA Vol (A2C):   94.0 ml  48.67 ml/m  RA Volume:   51.10 ml  26.46 ml/m LA Vol (A4C):   102.0 ml 52.82 ml/m LA Biplane Vol: 107.0 ml 55.40 ml/m AORTIC VALVE LVOT Vmax:   75.60 cm/s LVOT Vmean:  48.600 cm/s LVOT VTI:    0.150 m  AORTA Ao Root diam: 4.10 cm  MITRAL VALVE               TRICUSPID VALVE MV Area (PHT): 5.66 cm    TR Peak grad:   41.2 mmHg MV Decel Time: 134 msec    TR Vmax:        321.00 cm/s MV E velocity: 93.30 cm/s MV A velocity: 30.20 cm/s  SHUNTS MV E/A ratio:  3.09        Systemic VTI:  0.15 m Systemic Diam: 2.00 cm  Zoila Shutter MD Electronically signed by Zoila Shutter MD Signature Date/Time: 08/06/2022/4:42:49 PM    Final     Patient Profile     71 y.o. 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      Riley Lam, MD FASE Cardiologist [facility]  [address] [place], [address] [place], [place] [address] [phone]  7:28 AM

## 2022-08-10 NOTE — Progress Notes (Addendum)
STROKE TEAM PROGRESS NOTE   INTERVAL HISTORY His wife is at the bedside. Still has posey belt on, pulled out cortrak yesterday. Difficulty following commands.  Blanchard for another cortrak placement today and CIR referral sent to insurance today.   Vitals:   08/10/22 0400 08/10/22 0500 08/10/22 0600 08/10/22 0700  BP: 128/87 131/86 122/79 133/78  Pulse: 62 (!) 58 (!) 55 (!) 58  Resp: 19 17 19 20   Temp: 98.4 F (36.9 C)     TempSrc: Axillary     SpO2: 98% 97% 96% 94%  Weight:      Height:       CBC:  Recent Labs  Lab 08/06/22 0437  WBC 4.2  NEUTROABS 2.7  HGB 13.7  HCT 40.5  MCV 96.0  PLT 210    Basic Metabolic Panel:  Recent Labs  Lab 08/08/22 2011 08/09/22 0603 08/09/22 1826 08/10/22 0647  NA  --  138  --  141  K  --  3.8  --  4.3  CL  --  112*  --  110  CO2  --  21*  --  24  GLUCOSE  --  132*  --  97  BUN  --  14  --  12  CREATININE  --  0.67  --  0.78  CALCIUM  --  8.4*  --  8.9  MG 2.2 2.1 2.1 2.1  PHOS 1.9* 2.1*  --   --     Lipid Panel:  Recent Labs  Lab 08/07/22 0241  CHOL 130  TRIG 60  HDL 47  CHOLHDL 2.8  VLDL 12  LDLCALC 71    HgbA1c:  Recent Labs  Lab 08/06/22 1037  HGBA1C 4.6*    Urine Drug Screen:  Recent Labs  Lab 08/06/22 0948  LABOPIA NONE DETECTED  COCAINSCRNUR NONE DETECTED  LABBENZ NONE DETECTED  AMPHETMU NONE DETECTED  THCU NONE DETECTED  LABBARB NONE DETECTED     Alcohol Level  Recent Labs  Lab 08/06/22 0437  ETH <10     IMAGING past 24 hours No results found.  PHYSICAL EXAM: General - critically ill, elderly black man in mild distress. Seen in bed with restraints on d/t difficult to redirect d/t global aphasia HEENT- normocephalic, hoh  Cardiovascular - Regular rhythm and rate. Skin- warm, dry, intact  NEURO: Mental Status - Awake, alert, but poor attention. Globally aphasic, cannot follow verbal commands alone. With much coaching and mimicking, he can do some simple commands, but it is difficult to  keep his attention to complete task. CN: PERRL. Gaze is conjugate, left preference. Does not complete right gaze, will cross midline. =. Cannot get to follow commands to fully test VF or EOM. Inconsistent blink to threat on the right. Does track at times and able to cross midline. Right facial droop.  Motor/sensory: Right arm drifts down, able to better hold right leg, but still with drift. Withdraws to pain slower on the right, difficult to test fully d/t lack of communication. Gait and Station - deferred.  Baseline mRS: 3 for using a walker/cane, needing assistance with bills and some ADLs, but able to feed/dress self.   ASSESSMENT/Blanchard Dustin Blanchard is a 71 y.o. male with a past medical history significant for dementia, hypertension, hyperlipidemia, sinus bradycardia, BPH. He presented on 8/17 during early am hours when his wife heard him on the floor in his urine. He was treated with IV TNK and thrombectomy with TICI2c restoration of flow. On amiodarone infusion. Transfer orders  placed 8/21 and CIR admission pending.   Stroke: left MCA patchy infarct and a punctate right ACA infarct due to left M3 occlusion s/p TNK and IR with TICI2c,  embolic source likely d/t new diagnosed Afib CT showed no acute abnormality, old left occipital pole infarct.  Status post TNK.   CTA head and neck showed left M3 occlusion, bilateral siphon atherosclerosis with left moderate stenosis, left V4 stenosis.   CTP 0/123.   Status post IR with left M3 TICI2c reperfusion.   Repeat CT showed left frontal infarct without significant hemorrhage.   MRI showed left MCA patchy infarct with mild hemorrhagic conversion and a punctate right ACA infarct.   EF 35 to 40%. Severe LA dilation, LDL 71 A1c 4.6  UDS negative.  VTE prophylaxis -Lovenox No antithrombotic prior to admission, start Eliquis today after cortrak replacement Therapy recommendations:  CIR Disposition:  pending   New Afib  RVR Cardiomyopathy Tachybradycardia syndrome Cardiology and EP consulted  Still on amiodarone IV Defer cardiac ischemic evaluation for now, but may consider GDMT later We will start Eliquis today after core track placement   Hypertension Home meds:  none  Stable Did req Cleviprex gtt, now off Long-term BP goal normotensive   Hyperlipidemia Home meds: atorvastatin 20 LDL 71, goal < 70 Now on Lipitor 40 Continue statin at discharge   Dysphagia Did not pass swallow Has core track placed -> replaced 8/21 On tube feeding Speech on board   Cognitive impairment Delirium/sundowning Family reported cognitive impairment at baseline In-hospital delirium/sundowning On Seroquel 25 nightly and and 12.5mg  PRN agitation On Depakote 250 every 8h Close monitoring   Other Stroke Risk Factors Advanced Age >/= 2    Other Active Problems BPH  Hospital day # 4   Patient seen and examined by NP/APP with MD. MD to update note as needed.   Dustin Picker, DNP, FNP-BC [facility] Pager: [phone]  ATTENDING NOTE: I reviewed above note and agree with the assessment and Blanchard. Pt was seen and examined.   Wife at bedside.  Patient eyes open but still has global aphasia, except able to follow commands open and close eyes.  Still has significant right facial droop but mild right hemiparesis.  Heart rate 60s, cardiology on board, still on amiodarone IV.  Will start Eliquis today as neuro stable.  He removed core track overnight, again did not pass swallow, will need core track replaced.  PT/OT recommended CIR.  For detailed assessment and Blanchard, please refer to above/below as I have made changes wherever appropriate.   Dustin Plan, MD PhD Stroke Neurology 08/10/2022 4:08 PM  This patient is critically ill due to left MCA stroke, A-fib RVR, bradycardia tachycardia syndrome, cardiomyopathy, dysphagia, agitation/delirium and at significant risk of neurological worsening, death  form recurrent stroke, hemorrhagic transformation, heart failure, seizure, aspiration. This patient's care requires constant monitoring of vital signs, hemodynamics, respiratory and cardiac monitoring, review of multiple databases, neurological assessment, discussion with family, other specialists and medical decision making of high complexity. I spent 40 minutes of neurocritical care time in the care of this patient. I had long discussion with wife at bedside, updated pt current condition, treatment Blanchard and potential prognosis, and answered all the questions.  She expressed understanding and appreciation.       To contact Stroke Continuity provider, please refer to [email or web address]. After hours, contact General Neurology

## 2022-08-10 NOTE — Progress Notes (Signed)
Physical Therapy Treatment Patient Details Name: Dustin Blanchard MRN: [id number] DOB: [birth year] Today's Date: 08/10/2022   History of Present Illness The pt is a 71 yo male presenting 8/17 after a fall with slurred speech and R-sided weakness with L gaze. 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 Hospital course complicated by new onset afib with RVR, bradycardia, awaiting EP consult. PMH: hypertension, hyperlipidemia, sinus bradycardia, BPH, vascular dementia (dx in Dec, 2022 per family)    PT Comments    The pt was able to demo good progress with hallway mobility distance this morning, but continues to have deficits with R attention and awareness that impact safety with mobility. The pt requires minA to manage balance and positioning in RW as well as minA to physically manage direction and movement of RW with ambulation at this time. Max cues provided for posture, head positioning, and forwards gaze. Pt able to achieve midline briefly, but is limited in maintained attention. The pt will continue to benefit from max skilled PT to progress functional stability, awareness, and safety with OOB mobility to maximize independence after d/c. Continue to recommend acute inpatient rehab when medically stable for d/c.    Recommendations for follow up therapy are one component of a multi-disciplinary discharge planning process, led by the attending physician.  Recommendations may be updated based on patient status, additional functional criteria and insurance authorization.  Follow Up Recommendations  Acute inpatient rehab (3hours/day)     Assistance Recommended at Discharge Frequent or constant Supervision/Assistance  Patient can return home with the following A lot of help with walking and/or transfers;A lot of help with  bathing/dressing/bathroom;Assistance with cooking/housework;Assistance with feeding;Assist for transportation;Direct supervision/assist for financial management;Direct supervision/assist for medications management;Help with stairs or ramp for entrance   Equipment Recommendations   (defer to post acute)    Recommendations for Other Services       Precautions / Restrictions Precautions Precautions: Fall Restrictions Weight Bearing Restrictions: No Other Position/Activity Restrictions: SBP 120-150     Mobility  Bed Mobility Overal bed mobility: Needs Assistance Bed Mobility: Supine to Sit     Supine to sit: Min guard     General bed mobility comments: minG with use of LUE on bed rail, able to scoot when cued    Transfers Overall transfer level: Needs assistance Equipment used: Rolling walker (2 wheels) Transfers: Sit to/from Stand Sit to Stand: Min assist           General transfer comment: minA to rise and steady, assist to guide hands to RW and mod cues to maintain hands on RW    Ambulation/Gait Ambulation/Gait assistance: Min assist, +2 safety/equipment Gait Distance (Feet): 65 Feet Assistive device: Rolling walker (2 wheels) Gait Pattern/deviations: Step-through pattern, Narrow base of support, Shuffle, Drifts right/left Gait velocity: decreased Gait velocity interpretation: <1.31 ft/sec, indicative of household ambulator   General Gait Details: pt with no knee buckling, poor attention to R, needing cues to maintain hands on RW. gaze to L despite max cues to attend to midline through session.        Modified Rankin (Stroke Patients Only) Modified Rankin (Stroke Patients Only) Pre-Morbid Rankin Score: No symptoms Modified Rankin: Moderately severe disability     Balance Overall balance assessment: Needs assistance Sitting-balance support: Feet supported Sitting balance-Leahy Scale: Fair     Standing balance support: During functional activity,  Bilateral upper extremity supported Standing balance-Leahy Scale: Poor Standing balance comment: BUE support for static and dynamic gait                            Cognition Arousal/Alertness: Awake/alert Behavior During Therapy: Impulsive (emotional; crying at times - appears due ot his awareness/frustration regarding his deficits) Overall Cognitive Status: Difficult to assess                                 General Comments: appears apraxic; following commands with gestural cues at times; able to complete some tasks without cues, needs max cues to attend to midline and minimal attention to R. pt dies nod for yes/no but at times not accurate. for example: nodding yes to "do you need to go to the bathroom" but once pt in bathroom did not go        Exercises      General Comments General comments (skin integrity, edema, etc.): VSS on RA, pt in mittens, wrist restraints, and posey belt      Pertinent Vitals/Pain Pain Assessment Pain Assessment: No/denies pain Faces Pain Scale: No hurt Pain Intervention(s): Monitored during session     PT Goals (current goals can now be found in the care plan section) Acute Rehab PT Goals Patient Stated Goal: rehab and return home PT Goal Formulation: With patient Time For Goal Achievement: 08/21/22 Potential to Achieve Goals: Good Progress towards PT goals: Progressing toward goals    Frequency    Min 4X/week      PT Plan Current plan remains appropriate       AM-PAC PT "6 Clicks" Mobility   Outcome Measure  Help needed turning from your back to your side while in a flat bed without using bedrails?: A Little Help needed moving from lying on your back to sitting on the side of a flat bed without using bedrails?: A Little Help needed moving to and from a bed to a chair (including a wheelchair)?: A Little Help needed standing up from a chair using your arms (e.g., wheelchair or bedside chair)?: A Little Help  needed to walk in hospital room?: A Lot Help needed climbing 3-5 steps with a railing? : A Lot 6 Click Score: 16    End of Session Equipment Utilized During Treatment: Gait belt Activity Tolerance: Patient tolerated treatment well Patient left: with call bell/phone within reach;in bed;with nursing/sitter in room;with family/visitor present Nurse Communication: Mobility status PT Visit Diagnosis: Unsteadiness on feet (R26.81);Other abnormalities of gait and mobility (R26.89);Muscle weakness (generalized) (M62.81);Ataxic gait (R26.0)     Time: 9476-5465 PT Time Calculation (min) (ACUTE ONLY): 33 min  Charges:  $Gait Training: 8-22 mins $Therapeutic Activity: 8-22 mins                     Vickki Muff, PT, DPT   Acute Rehabilitation Department   Ronnie Derby 08/10/2022, 12:01 PM

## 2022-08-10 NOTE — Procedures (Signed)
Cortrak  Person Inserting Tube:  Vallerie Hentz D, RD Tube Type:  Cortrak - 43 inches Tube Size:  10 Tube Location:  Left nare Secured by: Bridle Technique Used to Measure Tube Placement:  Marking at nare/corner of mouth Cortrak Secured At:  64 cm  Cortrak Tube Team Note:  Consult received to place a Cortrak feeding tube.   X-ray is required, abdominal x-ray has been ordered by the Cortrak team. Please confirm tube placement before using the Cortrak tube.   If the tube becomes dislodged please keep the tube and contact the Cortrak team at [email or web address] (password [email or web address]) for replacement.  If after hours and replacement cannot be delayed, place a NG tube and confirm placement with an abdominal x-ray.    Arbadella Kimbler, RD, LDN Clinical Dietitian RD pager # available in AMION  After hours/weekend pager # available in AMION 

## 2022-08-10 NOTE — Telephone Encounter (Signed)
Pharmacy Patient Advocate Encounter  Insurance verification completed.    The patient is insured through [employer] Medicare Part D   The patient is currently admitted and ran test claims for the following: Eliquis, Xarelto.  Copays and coinsurance results were relayed to Inpatient clinical team.  

## 2022-08-10 NOTE — Progress Notes (Signed)
ANTICOAGULATION CONSULT NOTE - Initial Consult  Pharmacy Consult for Eliquis  Indication: stroke secondary to atrial fibrillation   No Known Allergies  Patient Measurements: Height: 6' (182.9 cm) Weight: 66.2 kg (145 lb 15.1 oz) IBW/kg (Calculated) : 77.6  Vital Signs: Temp: 98.4 F (36.9 C) (08/21 0400) Temp Source: Axillary (08/21 0400) BP: 133/78 (08/21 0700) Pulse Rate: 58 (08/21 0700)  Labs: Recent Labs    08/08/22 0449 08/09/22 0603 08/10/22 0647  CREATININE 0.78 0.67 0.78    Estimated Creatinine Clearance: 80.5 mL/min (by C-G formula based on SCr of 0.78 mg/dL).   Medical History: Past Medical History:  Diagnosis Date   Dementia [facility])     Assessment: Patient admitted for a stroke status post mechanical thrombectomy. Pharmacy consulted to start Eliquis for secondary stroke prevention. Patient's renal function and CBC stable. Test claim estimated monthly cost of $47.00. Spoke with family and they are okay with the cost.   No bleeding noted.   Goal of Therapy:  Monitor platelets by anticoagulation protocol: Yes   Plan:  Start Eliquis 5mg  BID for atrial fibrillation. Monitor for S/SX of bleeding.  Monitor CBC and renal function.   08/10/2022,9:07 AM

## 2022-08-10 NOTE — Progress Notes (Signed)
Inpatient Rehab Admissions Coordinator:    Note no plans for pacemaker placement during admission.  I will send case to insurance for CIR today, pending updated PT/ OT notes.   Megan Salon, MS, CCC-SLP Rehab Admissions Coordinator  [phone] (celll) [phone] (office)

## 2022-08-10 NOTE — TOC Benefit Eligibility Note (Signed)
Patient Advocate Encounter  Insurance verification completed.    The patient is currently admitted and upon discharge could be taking Eliquis 5 mg.  The current 30 day co-pay is $47.00.   The patient is currently admitted and upon discharge could be taking Xarelto 20 mg.  The current 30 day co-pay is $47.00.   The patient is insured through [employer] Medicare Part D     Jud Fanguy, CPhT Pharmacy Patient Advocate Specialist [facility] Patient Advocate Team Direct Number: [phone]  Fax: [phone]        

## 2022-08-10 NOTE — Progress Notes (Signed)
OT Cancellation Note  Patient Details Name: Dustin Blanchard MRN: [id number] DOB: 01-30-1951   Cancelled Treatment:    Reason Eval/Treat Not Completed: Patient at procedure or test/ unavailable (Pt getting cortrak placed upon arrival - OT treatment to f/u when appropriate.)  Kendelle Schweers A Mishael Krysiak 08/10/2022, 11:54 AM

## 2022-08-11 DIAGNOSIS — I5043 Acute on chronic combined systolic (congestive) and diastolic (congestive) heart failure: Secondary | ICD-10-CM | POA: Diagnosis not present

## 2022-08-11 DIAGNOSIS — I63512 Cerebral infarction due to unspecified occlusion or stenosis of left middle cerebral artery: Secondary | ICD-10-CM | POA: Diagnosis not present

## 2022-08-11 DIAGNOSIS — I4891 Unspecified atrial fibrillation: Secondary | ICD-10-CM | POA: Diagnosis not present

## 2022-08-11 LAB — COMPREHENSIVE METABOLIC PANEL
ALT: 12 U/L (ref 0–44)
AST: 17 U/L (ref 15–41)
Albumin: 2.8 g/dL — ABNORMAL LOW (ref 3.5–5.0)
Alkaline Phosphatase: 52 U/L (ref 38–126)
Anion gap: 9 (ref 5–15)
BUN: 21 mg/dL (ref 8–23)
CO2: 21 mmol/L — ABNORMAL LOW (ref 22–32)
Calcium: 8.9 mg/dL (ref 8.9–10.3)
Chloride: 110 mmol/L (ref 98–111)
Creatinine, Ser: 0.89 mg/dL (ref 0.61–1.24)
GFR, Estimated: >60 mL/min (ref >60)
Glucose, Bld: 132 mg/dL — ABNORMAL HIGH (ref 70–99)
Potassium: 4 mmol/L (ref 3.5–5.1)
Sodium: 140 mmol/L (ref 135–145)
Total Bilirubin: 0.7 mg/dL (ref 0.3–1.2)
Total Protein: 5.3 g/dL — ABNORMAL LOW (ref 6.5–8.1)

## 2022-08-11 LAB — GLUCOSE, CAPILLARY
Glucose-Capillary: 110 mg/dL — ABNORMAL HIGH (ref 70–99)
Glucose-Capillary: 120 mg/dL — ABNORMAL HIGH (ref 70–99)
Glucose-Capillary: 123 mg/dL — ABNORMAL HIGH (ref 70–99)
Glucose-Capillary: 134 mg/dL — ABNORMAL HIGH (ref 70–99)
Glucose-Capillary: 136 mg/dL — ABNORMAL HIGH (ref 70–99)
Glucose-Capillary: 153 mg/dL — ABNORMAL HIGH (ref 70–99)

## 2022-08-11 LAB — MAGNESIUM: Magnesium: 2.1 mg/dL (ref 1.7–2.4)

## 2022-08-11 MED ORDER — AMIODARONE HCL 200 MG PO TABS
400.0000 mg | ORAL_TABLET | Freq: Two times a day (BID) | ORAL | Status: DC
Start: 2022-08-11 — End: 2022-08-12
  Administered 2022-08-11 – 2022-08-12 (×3): 400 mg
  Filled 2022-08-11 (×3): qty 2

## 2022-08-11 MED ORDER — AMIODARONE HCL 200 MG PO TABS: 200.0000 mg | ORAL_TABLET | Freq: Every day | ORAL | Status: DC

## 2022-08-11 MED ORDER — SPIRONOLACTONE 12.5 MG HALF TABLET
12.5000 mg | ORAL_TABLET | Freq: Every day | ORAL | Status: DC
  Administered 2022-08-11: 12.5 mg
  Filled 2022-08-11 (×2): qty 1

## 2022-08-11 NOTE — Progress Notes (Addendum)
STROKE TEAM PROGRESS NOTE   INTERVAL HISTORY His wife is at the bedside.  Amiodarone transitioned to 400mg  BID x1 week and then 200mg  daily per cardiology.  Cortrak was replaced and eliquis started yesterday.   Vitals:   08/11/22 0004 08/11/22 0445 08/11/22 0446 08/11/22 0801  BP: 113/75 110/67  121/70  Pulse: (!) 49 (!) 47  (!) 51  Resp: 15 16  16   Temp: 97.9 F (36.6 C) (!) 97.5 F (36.4 C)  97.6 F (36.4 C)  TempSrc: Axillary Axillary  Axillary  SpO2: 100% 99%    Weight:   67.8 kg   Height:       CBC:  Recent Labs  Lab 08/06/22 0437  WBC 4.2  NEUTROABS 2.7  HGB 13.7  HCT 40.5  MCV 96.0  PLT 210    Basic Metabolic Panel:  Recent Labs  Lab 08/08/22 2011 08/09/22 0603 08/09/22 1826 08/10/22 0647 08/11/22 0419  NA  --  138  --  141 140  K  --  3.8  --  4.3 4.0  CL  --  112*  --  110 110  CO2  --  21*  --  24 21*  GLUCOSE  --  132*  --  97 132*  BUN  --  14  --  12 21  CREATININE  --  0.67  --  0.78 0.89  CALCIUM  --  8.4*  --  8.9 8.9  MG 2.2 2.1   < > 2.1 2.1  PHOS 1.9* 2.1*  --   --   --    < > = values in this interval not displayed.    Lipid Panel:  Recent Labs  Lab 08/07/22 0241  CHOL 130  TRIG 60  HDL 47  CHOLHDL 2.8  VLDL 12  LDLCALC 71    HgbA1c:  Recent Labs  Lab 08/06/22 1037  HGBA1C 4.6*    Urine Drug Screen:  Recent Labs  Lab 08/06/22 0948  LABOPIA NONE DETECTED  COCAINSCRNUR NONE DETECTED  LABBENZ NONE DETECTED  AMPHETMU NONE DETECTED  THCU NONE DETECTED  LABBARB NONE DETECTED     Alcohol Level  Recent Labs  Lab 08/06/22 0437  ETH <10     IMAGING past 24 hours DG Abd Portable 1V  Result Date: 08/10/2022 CLINICAL DATA:  Is a gastric tube placement. EXAM: PORTABLE ABDOMEN - 1 VIEW COMPARISON:  08/07/2022. FINDINGS: Feeding tube terminates in the distal gastric antrum. There is retained contrast in the colon. Visualized lung bases are grossly clear. IMPRESSION: Feeding tube terminates in the distal gastric  antrum. Electronically Signed   By: 08/08/22 M.D.   On: 08/10/2022 14:18    PHYSICAL EXAM: General - critically ill, elderly black man in mild distress. Seen in bed with restraints on d/t difficult to redirect d/t global aphasia HEENT- normocephalic, hoh  Cardiovascular - Regular rhythm and rate. Skin- warm, dry, intact  NEURO: Mental Status - Awake, alert, but poor attention. Globally aphasic, cannot follow verbal commands alone. With much coaching and mimicking, he can do some simple commands, but it is difficult to keep his attention to complete task. Attempting to verbalize more today.  CN: PERRL. Gaze is conjugate, left preference. Does not complete right gaze, will cross midline. =. Cannot get to follow commands to fully test VF or EOM. Inconsistent blink to threat on the right. Does track at times and able to cross midline. Right facial droop.  Motor/sensory: Right arm drifts down, able to better  hold right leg, but still with drift. Withdraws to pain slower on the right, difficult to test fully d/t lack of communication. Gait and Station - deferred.  Baseline mRS: 3 for using a walker/cane, needing assistance with bills and some ADLs, but able to feed/dress self.   ASSESSMENT/PLAN Dustin Blanchard is a 71 y.o. male with a past medical history significant for dementia, hypertension, hyperlipidemia, sinus bradycardia, BPH. He presented on 8/17 during early am hours when his wife heard him on the floor in his urine. He was treated with IV TNK and thrombectomy with TICI2c restoration of flow. Transfer orders placed 8/21 and CIR admission pending. Anticoagulation with eliquis and amiodarone transitioned to PO.   Stroke: left MCA patchy infarct and a punctate right ACA infarct due to left M3 occlusion s/p TNK and IR with TICI2c,  embolic source likely d/t new diagnosed Afib CT showed no acute abnormality, old left occipital pole infarct.  Status post TNK.   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   CTP 0/123.   Status post IR with left M3 TICI2c reperfusion.   Repeat CT showed left frontal infarct without significant hemorrhage.   MRI showed left MCA patchy infarct with mild hemorrhagic conversion and a punctate right ACA infarct.   EF 35 to 40%. Severe LA dilation, LDL 71 A1c 4.6  UDS negative.  VTE prophylaxis -Lovenox No antithrombotic prior to admission, Eliquis intiated  Therapy recommendations:  CIR Disposition:  pending   New Afib RVR Cardiomyopathy Tachybradycardia syndrome Cardiology and EP consulted  Amio IV -> PO 400mg  BID x1 week and then 200mg  daily Defer cardiac ischemic evaluation for now, but may consider GDMT later Eliquis started 8/21   Hypertension Home meds:  none  Stable Did req Cleviprex gtt, now off Long-term BP goal normotensive   Hyperlipidemia Home meds: atorvastatin 20 LDL 71, goal < 70 Now on Lipitor 40 Continue statin at discharge   Dysphagia Did not pass swallow Has core track placed -> replaced 8/21 On tube feeding Speech on board   Cognitive impairment Delirium/sundowning Family reported cognitive impairment at baseline In-hospital delirium/sundowning On Seroquel 25 nightly and and 12.5mg  PRN agitation On Depakote 250 every 8h Close monitoring   Other Stroke Risk Factors Advanced Age >/= 74  Chronic Heart Failure with mitral regurgitation- start spironolactone today per cardiology and follow up outpatient for ischemic workup  and cardiac CT  No BB due to bradycardia   Other Active Problems BPH  Hospital day # 5   Patient seen and examined by NP/APP with MD. MD to update note as needed.   9/21, DNP, FNP-BC [facility] Pager: [phone]  ATTENDING NOTE: I reviewed above note and agree with the assessment and plan. Pt was seen and examined.   Wife at the bedside. Pt lying in bed still lethargic  but seems more awake alert than yesterday but otherwise neuro stable unchanged. Cardiology switched amiodarone from IV to po. Has been tolerating eliquis well. Still on cortrak TF @ 50, will d/c IVF. Pending CIR placement.   For detailed assessment and plan, please refer to above/below as I have made changes wherever appropriate.   Dustin Picker, MD PhD Stroke Neurology 08/11/2022 1:09 PM     To contact Stroke Continuity provider, please refer to [facility] Plan. After hours, contact General Neurology

## 2022-08-11 NOTE — Care Management Important Message (Signed)
Important Message  Patient Details  Name: Dustin Blanchard MRN: [id number] Date of Birth: Oct 27, 1951   Medicare Important Message Given:  Yes     Hilary Pundt Stefan Church 08/11/2022, 3:16 PM

## 2022-08-11 NOTE — PMR Pre-admission (Signed)
PMR Admission Coordinator Pre-Admission Assessment  Patient: Dustin Blanchard is an 71 y.o., male MRN: [id number] DOB: [birth year] Height: 6' (182.9 cm) Weight: 67.8 kg  Insurance Information HMO:     PPO:yes      PCP:      IPA:      80/20:      OTHER:  PRIMARY: [employer] Medicare       Policy#: [id number]      Subscriber: Pt CM Name: ***      Phone#: ***     Fax#:  [phone] Pre-Cert#: [id number]      Employer: *** Benefits:  Phone #: ***     Name: Irene Shipper Date: 12/21/2021 - still active Deductible: does not have OOP Max: $4,500 ($209 met) CIR: $295/day co-pay with a max co-pay of $1,770/admission (6 days) SNF: $0/day co-pay for days 1-20, $196/day co-pay for days 21-100; limited to 100 days/cal yr. Outpatient:  $35/visit co-pay Home Health:  100% coverage DME: 80% coverage; 20% co-insurance Providers: in network   SECONDARY:       Policy#:      Phone#:   Development worker, community:       Phone#:   The Engineer, petroleum" for patients in Inpatient Rehabilitation Facilities with attached "Privacy Act [place] Records" was provided and verbally reviewed with: Family  Emergency Contact Information Contact Information     Name Relation Home Work [place] Spouse [phone]  [phone]   [place], [place]   [phone]       Current Medical History  Patient Admitting Diagnosis: CVA History of Present Illness:Dustin Blanchard is a 71 y.o. male with a past medical history significant for dementia, hypertension, hyperlipidemia, sinus bradycardia, BPH. He presented to the ED on 8/17 during early am hours when his wife heard him on the floor in his urine.MRI performed ant Pt. 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 Repeat CT showed left frontal infarct without significant hemorrhage. Echo showed EF 35 to 40%.  Severe LA dilation. Pt. With dysphagia post stroke, requiring coretrak Also with tachybradycardia syndrome, Cardiology and EP consulted with recommendations for  Amio IV -> PO 456m BID x1 week and then 204mdaily. Decision made to defer cardiac ischemic evaluation for now, but may consider GDMT later. Eliquis started 8/21. PT/OT/SLP saw Pt. And recommended CIR to assist return to PLOF.   Complete NIHSS TOTAL: 15  [facility] by the rehabilitation admission coordinator and physician.  Past Medical History  Past Medical History:  Diagnosis Date   Dementia (HCPolonia    Has the patient had major surgery during 100 days prior to admission? Yes  Family History   family history is not on file.  Current Medications  Current Facility-Administered Medications:    acetaminophen (TYLENOL) tablet 650 mg, 650 mg, Oral, Q4H PRN **OR** acetaminophen (TYLENOL) 160 MG/5ML solution 650 mg, 650 mg, Per Tube, Q4H PRN **OR** acetaminophen (TYLENOL) suppository 650 mg, 650 mg, Rectal, Q4H PRN, de MaSindy MessingKaErven CollaMD   [SDerrill MemoN 08/18/2022] amiodarone (PACERONE) tablet 200 mg, 200 mg, Per Tube, Daily, Chandrasekhar, Mahesh A, MD   amiodarone (PACERONE) tablet 400 mg, 400 mg, Per Tube, BID, Chandrasekhar, Mahesh A, MD, 400 mg at 08/11/22 1214   apixaban (ELIQUIS) tablet 5 mg, 5 mg, Per Tube, BID, XuErlinda Hong  Jindong, MD, 5 mg at 08/11/22 0840   atorvastatin (LIPITOR) tablet 40 mg, 40 mg, Per Tube, Daily, [name], MD, 40 mg at 08/11/22 0840   Chlorhexidine Gluconate Cloth 2 % PADS 6 each, 6 each, Topical, Daily, Bhagat, Srishti L, MD, 6 each at 08/09/22 1000   feeding supplement (OSMOLITE 1.5 CAL) liquid 1,000 mL, 1,000 mL, Per Tube, Continuous, Garvin Fila, MD, Last Rate: 50 mL/hr at 08/10/22 1457, 1,000 mL at 08/10/22 1457   feeding supplement (PROSource TF20) liquid 60 mL, 60 mL, Per Tube, BID, Garvin Fila, MD, 60 mL at 08/11/22  0841   labetalol (NORMODYNE) injection 20 mg, 20 mg, Intravenous, Once **AND** [DISCONTINUED] clevidipine (CLEVIPREX) infusion 0.5 mg/mL, 0-21 mg/hr, Intravenous, Continuous, de Sindy Messing, [place]in[place], MD, Stopped at 08/06/22 1941   multivitamin with minerals tablet 1 tablet, 1 tablet, Per Tube, Daily, Garvin Fila, MD, 1 tablet at 08/11/22 0840   Oral care mouth rinse, 15 mL, Mouth Rinse, PRN, Garvin Fila, MD   pantoprazole (PROTONIX) 2 mg/mL oral suspension 40 mg, 40 mg, Per Tube, QHS, Kaleen Mask, RPH, 40 mg at 08/10/22 2133   QUEtiapine (SEROQUEL) tablet 12.5 mg, 12.5 mg, Per Tube, TID PRN, Metzger-Cihelka, Desiree, NP, 12.5 mg at 08/09/22 0107   QUEtiapine (SEROQUEL) tablet 25 mg, 25 mg, Per Tube, Q supper, Metzger-Cihelka, Desiree, NP, 25 mg at 08/10/22 1729   senna-docusate (Senokot-S) tablet 1 tablet, 1 tablet, Oral, BID PRN, Bhagat, Srishti L, MD   spironolactone (ALDACTONE) tablet 12.5 mg, 12.5 mg, Per Tube, Daily, Chandrasekhar, Mahesh A, MD, 12.5 mg at 08/11/22 1214   valproic acid (DEPAKENE) 250 MG/5ML solution 250 mg, 250 mg, Per Tube, TID Haze Rushing, MD, 250 mg at 08/11/22 1214  Patients Current Diet:  Diet Order             Diet NPO time specified  Diet effective now                   Precautions / Restrictions Precautions Precautions: Fall Precaution Comments: R inattention Restrictions Weight Bearing Restrictions: No Other Position/Activity Restrictions: SBP 120-150   Has the patient had 2 or more falls or a fall with injury in the past year? Yes  Prior Activity Level Community (5-7x/wk): Pt. active n the community PTA  Prior Functional Level Self Care: Did the patient need help bathing, dressing, using the toilet or eating? Needed some help  Indoor Mobility: Did the patient need assistance with walking from room to room (with or without device)? Independent  Stairs: Did the patient need assistance with internal or external stairs (with  or without device)? Needed some help  Functional Cognition: Did the patient need help planning regular tasks such as shopping or remembering to take medications? Needed some help  Patient Information Are you of Hispanic, Latino/a,or Spanish origin?: A. No, not of Hispanic, Latino/a, or Spanish origin (Information from proxy) What is your race?: B. Black or African American Do you need or want an interpreter to communicate with a doctor or health care staff?: 0. No  Patient's Response To:  Health Literacy and Transportation Is the patient able to respond to health literacy and transportation needs?: No Health Literacy - How often do you need to have someone help you when you read instructions, pamphlets, or other written material from your doctor or pharmacy?: Patient unable to respond In the past 12 months, has lack of transportation kept you from medical appointments or from getting medications?: No (  information from proxy) In the past 12 months, has lack of transportation kept you from meetings, work, or from getting things needed for daily living?: No  Development worker, international aid / [place]: Cane - single point, BSC/3in1, Civil engineer, contracting  Prior Device Use: Indicate devices/aids used by the patient prior to current illness, exacerbation or injury? None of the above  Current Functional Level Cognition  Arousal/Alertness:  (due to aphasia) Overall Cognitive Status: Difficult to assess Difficult to assess due to: Impaired communication Orientation Level: Disoriented X4 General Comments: pt moaning and appeared restless and eager to get up from recliner Attention: Focused Focused Attention: Impaired Focused Attention Impairment: Verbal complex    Extremity Assessment (includes Sensation/Coordination)  Upper Extremity Assessment: RUE deficits/detail, LUE deficits/detail RUE Deficits / Details: increased movemetn noted RUE - attmepting to use to donn socks; ablet o grasp sock to  assist with pulling uip; poor in-hand manipulation skills RUE Sensation: decreased light touch RUE Coordination: decreased fine motor, decreased gross motor LUE Deficits / Details: generalized weakness  Lower Extremity Assessment: Defer to PT evaluation RLE Deficits / Details: apraxic with movements, limited against gravity but no buckling noted with standing activity. impaired command following further limited assessment. RLE Coordination: decreased gross motor, decreased fine motor    ADLs  Overall ADL's : Needs assistance/impaired Eating/Feeding: NPO (coretrack) Grooming: Moderate assistance Upper Body Bathing: Moderate assistance Lower Body Bathing: Moderate assistance Upper Body Dressing : Moderate assistance Lower Body Dressing: Moderate assistance Toilet Transfer: Minimal assistance, Ambulation Toileting- Clothing Manipulation and Hygiene: Total assistance Toileting - Clothing Manipulation Details (indicate cue type and reason): incontinenet of BM; wife states her husband was very "fidgity" earlier and she feels he was most likely needing to have a BM; May benefit fomr use of picture communication baord - Functional mobility during ADLs: Minimal assistance, +2 for safety/equipment    Mobility  Overal bed mobility: Needs Assistance Bed Mobility: Supine to Sit, Rolling Rolling: Min assist Supine to sit: Min assist General bed mobility comments: in recliner upon entry and exit    Transfers  Overall transfer level: Needs assistance Equipment used: Rolling walker (2 wheels) Transfers: Sit to/from Stand Sit to Stand: Min assist General transfer comment: stood from recliner with RW and min A but required max hand over hand cues for hand placement on RW and pt attempting to pull at lines/leads    Ambulation / Gait / Stairs / Emergency planning/management officer  Ambulation/Gait Ambulation/Gait assistance: +2 safety/equipment, Mod assist Gait Distance (Feet): 125 Feet Assistive device: Rolling  walker (2 wheels) Gait Pattern/deviations: Step-through pattern, Narrow base of support, Shuffle, Drifts right/left, Decreased step length - right, Decreased step length - left, Trunk flexed General Gait Details: pt required mod A to steer RW as pt with significant R inattention. Required +2 for recliner follow and +3 to manage lines/leads. Pt required cues for upright posture/gaze but with poor carry over of cues Gait velocity: decreased Gait velocity interpretation: <1.31 ft/sec, indicative of household ambulator    Posture / Balance Dynamic Sitting Balance Sitting balance - Comments: sitting at edge of recliner with close supervision Balance Overall balance assessment: Needs assistance Sitting-balance support: Feet supported, Bilateral upper extremity supported Sitting balance-Leahy Scale: Fair Sitting balance - Comments: sitting at edge of recliner with close supervision Standing balance support: During functional activity, Bilateral upper extremity supported (RW) Standing balance-Leahy Scale: Poor Standing balance comment: pt able to maintain static standing balance with min guard but required min A/mod A for dynamic standing balance (mainly to  control/steer RW)    Special needs/care consideration Special service needs coretrak   Previous Home Environment (from acute therapy documentation) Living Arrangements: Spouse/significant other  Lives With: Spouse Available Help at Discharge: Family, Available 24 hours/day Type of Home: House Home Layout: One level Home Access: Stairs to enter Entrance Stairs-Rails: None Entrance Stairs-Number of Steps: 3 Bathroom Shower/Tub: Chiropodist: Handicapped height Bathroom Accessibility: Yes How Accessible: Accessible via walker  Discharge Living Setting Plans for Discharge Living Setting: Patient's home Type of Home at Discharge: House Discharge Home Layout: One level Discharge Home Access: Stairs to enter Entrance  Stairs-Rails: None Entrance Stairs-Number of Steps: 3 Discharge Bathroom Shower/Tub: Tub/shower unit Discharge Bathroom Toilet: Handicapped height Discharge Bathroom Accessibility: Yes How Accessible: Accessible via walker  Social/Family/Support Systems Patient Roles: Spouse Contact Information: [phone] Anticipated Caregiver: Child psychotherapist (wife Ability/Limitations of Caregiver: 24/7 Caregiver Availability: 24/7 Discharge Plan Discussed with Primary Caregiver: Yes Is Caregiver In Agreement with Plan?: Yes Does Caregiver/Family have Issues with Lodging/Transportation while Pt is in Rehab?: No  Goals Patient/Family Goal for Rehab: PT/OT Supervision, SLP Mod A Expected length of stay: 10-12 days Pt/Family Agrees to Admission and willing to participate: Yes Program Orientation Provided & Reviewed with Pt/Caregiver Including Roles  & Responsibilities: Yes  Decrease burden of Care through IP rehab admission: not anticipated   Possible need for SNF placement upon discharge: n/a  Patient Condition: I have reviewed medical records from [facility] , spoken with CM, and patient and spouse. I met with patient at the bedside for inpatient rehabilitation assessment.  Patient will benefit from ongoing PT, OT, and SLP, can actively participate in 3 hours of therapy a day 5 days of the week, and can make measurable gains during the admission.  Patient will also benefit from the coordinated team approach during an Inpatient Acute Rehabilitation admission.  The patient will receive intensive therapy as well as Rehabilitation physician, nursing, social worker, and care management interventions.  Due to bladder management, bowel management, safety, skin/wound care, disease management, medication administration, pain management, and patient education the patient requires 24 hour a day rehabilitation nursing.  The patient is currently *** with mobility and basic ADLs.  Discharge setting  and therapy post discharge at home with home health is anticipated.  Patient has agreed to participate in the Acute Inpatient Rehabilitation Program and will admit {Time; today/tomorrow:10263}.  Preadmission Screen Completed By:  Genella Mech, 08/11/2022 1:09 PM ______________________________________________________________________   Discussed status with Dr. Marland Kitchen on *** at *** and received approval for admission today.  Admission Coordinator:  Genella Mech, CCC-SLP, time Marland KitchenSudie Grumbling ***   Assessment/Plan: Diagnosis: Does the need for close, 24 hr/day Medical supervision in concert with the patient's rehab needs make it unreasonable for this patient to be served in a less intensive setting? {yes_no_potentially:3041433} Co-Morbidities requiring supervision/potential complications: *** Due to {due YE:3343568}, does the patient require 24 hr/day rehab nursing? {yes_no_potentially:3041433} Does the patient require coordinated care of a physician, rehab nurse, PT, OT, and SLP to address physical and functional deficits in the context of the above medical diagnosis(es)? {yes_no_potentially:3041433} Addressing deficits in the following areas: {deficits:3041436} Can the patient actively participate in an intensive therapy program of at least 3 hrs of therapy 5 days a week? {yes_no_potentially:3041433} The potential for patient to make measurable gains while on inpatient rehab is {potential:3041437} Anticipated functional outcomes upon discharge from inpatient rehab: {functional outcomes:304600100} PT, {functional outcomes:304600100} OT, {functional outcomes:304600100} SLP Estimated rehab length of stay to reach the  above functional goals is: *** Anticipated discharge destination: {anticipated dc setting:21604} 10. Overall Rehab/Functional Prognosis: {potential:3041437}   MD Signature: ***

## 2022-08-11 NOTE — Progress Notes (Signed)
Inpatient Rehab Admissions Coordinator:    Received auth for CIR, but pt. Is not medically ready at this time. Will follow for auth once medically ready.  Megan Salon, MS, CCC-SLP Rehab Admissions Coordinator  [phone] (celll) [phone] (office)

## 2022-08-11 NOTE — Progress Notes (Signed)
Speech Language Pathology Treatment: Dysphagia  Patient Details Name: Dustin Blanchard MRN: [id number] DOB: [birth year] Today's Date: 08/11/2022 Time: 7001-7494 SLP Time Calculation (min) (ACUTE ONLY): 24 min  Assessment / Plan / Recommendation Clinical Impression  Pt was seen for treatment with his wife present. [name] reported that the pt has been producing some vocalization to indicate responses to questions. Oral care was recently provided and p.o. trials were readily accepted by the pt. Subtle throat clearing was observed with small amounts of puree; throat clearing was noted to indicate aspiration during the MBS. Pt tolerated ice chips without immediate s/s of aspiration, but instances of aspiration of thin liquids were silent during the MBS. Effortful swallows were attempted, but effort appeared improved by the end of the session. Increased vocalization was noted during the session and pt was able to respond to simple yes/no questions with 60% accuracy given verbal prompts and orthographic cues (i.e., written "yes" and "no"). Intonation to indicate yes/no was also observed in some instances. Pt's NPO status will be maintained with continued allowance of ice chips after oral care. [name] has been educated on prompting pt for an effortful swallow and secondary swallows. She verbalized understanding and agreement with plan of care. SLP will continue to follow pt.    HPI HPI: Pt is a 71 y.o. male who presented with right-sided weakness and aphasia. CT head: Density overlying the left cerebral convexity is grossly similar  to the intra procedural images obtained earlier the same day and is  favored to reflect predominantly contrast extravasation; however,  superimposed subarachnoid hemorrhage can not be entirely excluded. Small focus of hypodensity in the left frontal lobe cortex suspicious for small evolving infarct. PMH: hypertension, hyperlipidemia, sinus bradycardia, BPH, vascular dementia (dx in  Dec, 2022 per family)      SLP Plan  Continue with current plan of care      Recommendations for follow up therapy are one component of a multi-disciplinary discharge planning process, led by the attending physician.  Recommendations may be updated based on patient status, additional functional criteria and insurance authorization.    Recommendations  Diet recommendations: NPO (ice chips following oral care) Medication Administration: Via alternative means                Follow Up Recommendations: Acute inpatient rehab (3hours/day) Assistance recommended at discharge: Frequent or constant Supervision/Assistance SLP Visit Diagnosis: Dysphagia, oropharyngeal phase (R13.12) Plan: Continue with current plan of care         Estevon Fluke I. Vear Clock, MS, CCC-SLP Acute Rehabilitation Services Office number [phone]   Scheryl Marten  08/11/2022, 10:28 AM

## 2022-08-11 NOTE — Progress Notes (Signed)
Progress Note  Patient Name: Dustin Blanchard Date of Encounter: 08/11/2022  Primary Cardiologist: Little Ishikawa, MD   Subjective   Overnight transitioned to 3W. Patent tracks, wife is at bedside. Has global aphasia; but he attempts to vocalize more today  Inpatient Medications    Scheduled Meds:  apixaban  5 mg Per Tube BID   atorvastatin  40 mg Per Tube Daily   Chlorhexidine Gluconate Cloth  6 each Topical Daily   feeding supplement (PROSource TF20)  60 mL Per Tube BID   labetalol  20 mg Intravenous Once   multivitamin with minerals  1 tablet Per Tube Daily   pantoprazole  40 mg Per Tube QHS   QUEtiapine  25 mg Per Tube Q supper   valproic acid  250 mg Per Tube TID AC   Continuous Infusions:  sodium chloride 15 mL/hr at 08/10/22 1000   amiodarone 30 mg/hr (08/10/22 2055)   feeding supplement (OSMOLITE 1.5 CAL) 1,000 mL (08/10/22 1457)   PRN Meds: acetaminophen **OR** acetaminophen (TYLENOL) oral liquid 160 mg/5 mL **OR** acetaminophen, mouth rinse, QUEtiapine, senna-docusate   Vital Signs    Vitals:   08/11/22 0004 08/11/22 0445 08/11/22 0446 08/11/22 0801  BP: 113/75 110/67  121/70  Pulse: (!) 49 (!) 47  (!) 51  Resp: 15 16  16   Temp: 97.9 F (36.6 C) (!) 97.5 F (36.4 C)  97.6 F (36.4 C)  TempSrc: Axillary Axillary  Axillary  SpO2: 100% 99%    Weight:   67.8 kg   Height:        Intake/Output Summary (Last 24 hours) at 08/11/2022 0956 Last data filed at 08/11/2022 0450 Gross per 24 hour  Intake 63.33 ml  Output 565 ml  Net -501.67 ml   Filed Weights   08/09/22 0500 08/10/22 0359 08/11/22 0446  Weight: 64.8 kg 66.2 kg 67.8 kg    Telemetry    Sinus bradycardia without CHB- Personally Reviewed  Physical Exam   Gen: no distress   Neck: No JVD Cardiac: No Rubs or Gallops, no murmur, regular bradycardia Respiratory: Clear to auscultation bilaterally, normal effort, normal  respiratory rate GI: Soft, nontender, non-distended  MS: No   edema Integument: Skin feels warm Neuro:  Hands in mittens, tracks, global phasia, more aware than 08/10/22  Labs    Chemistry Recent Labs  Lab 08/09/22 0603 08/10/22 0647 08/11/22 0419  NA 138 141 140  K 3.8 4.3 4.0  CL 112* 110 110  CO2 21* 24 21*  GLUCOSE 132* 97 132*  BUN 14 12 21   CREATININE 0.67 0.78 0.89  CALCIUM 8.4* 8.9 8.9  PROT 5.3* 6.0* 5.3*  ALBUMIN 3.0* 3.1* 2.8*  AST 15 16 17   ALT 10 13 12   ALKPHOS 55 58 52  BILITOT 1.0 1.1 0.7  GFRNONAA >60 >60 >60  ANIONGAP 5 7 9      Hematology Recent Labs  Lab 08/06/22 0437  WBC 4.2  RBC 4.22  HGB 13.7  HCT 40.5  MCV 96.0  MCH 32.5  MCHC 33.8  RDW 12.3  PLT 210    Cardiac EnzymesNo results for input(s): "TROPONINI" in the last 168 hours. No results for input(s): "TROPIPOC" in the last 168 hours.   BNPNo results for input(s): "BNP", "PROBNP" in the last 168 hours.   DDimer No results for input(s): "DDIMER" in the last 168 hours.   Radiology    DG Abd Portable 1V  Result Date: 08/10/2022 CLINICAL DATA:  Is a gastric tube  placement. EXAM: PORTABLE ABDOMEN - 1 VIEW COMPARISON:  08/07/2022. FINDINGS: Feeding tube terminates in the distal gastric antrum. There is retained contrast in the colon. Visualized lung bases are grossly clear. IMPRESSION: Feeding tube terminates in the distal gastric antrum. Electronically Signed   By: Lorin Picket M.D.   On: 08/10/2022 14:18    Cardiac Studies   ECHO COMPLETE WO IMAGING ENHANCING AGENT 08/06/2022  Narrative ECHOCARDIOGRAM REPORT    Patient Name:   Dustin Blanchard Date of Exam: 08/06/2022 Medical Rec #:  [id number]      Height:       72.0 in Accession #:    [id number]     Weight:       158.7 lb Date of Birth:  02/24/1951       BSA:          1.931 m Patient Age:    71 years       BP:           136/90 mmHg Patient Gender: M              HR:           81 bpm. Exam Location:  Inpatient  Procedure: 2D Echo, Cardiac Doppler and Color Doppler  Indications:     Stroke  History:        Patient has no prior history of Echocardiogram examinations. Acute ischemic left MCA stroke ([place]), Dementia.  Sonographer:    Alvino Chapel RCS Referring Phys: [id number] [place]   1. Coarse trabeculation of the apex without obvious thrombus. Left ventricular ejection fraction, by estimation, is 35 to 40%. Left ventricular ejection fraction by 2D MOD biplane is 37.1 %. The left ventricle has moderately decreased function. The left ventricle demonstrates global hypokinesis. There is mild left ventricular hypertrophy. Left ventricular diastolic parameters are consistent with Grade III diastolic dysfunction (restrictive). Elevated left ventricular end-diastolic pressure. 2. Right ventricular systolic function is low normal. The right ventricular size is normal. There is mildly elevated pulmonary artery systolic pressure. The estimated right ventricular systolic pressure is [id number] mmHg. 3. Left atrial size was severely dilated. 4. Moderate pericardial effusion. The pericardial effusion is circumferential. There is no evidence of cardiac tamponade. 5. The mitral valve is abnormal. Moderate mitral valve regurgitation. 6. The aortic valve is tricuspid. Aortic valve regurgitation is trivial. 7. The inferior vena cava is normal in size with greater than 50% respiratory variability, suggesting right atrial pressure of 3 mmHg.  Comparison(s): No prior Echocardiogram.  FINDINGS Left Ventricle: Coarse trabeculation of the apex without obvious thrombus. Left ventricular ejection fraction, by estimation, is 35 to 40%. Left ventricular ejection fraction by 2D MOD biplane is 37.1 %. The left ventricle has moderately decreased function. The left ventricle demonstrates global hypokinesis. The left ventricular internal cavity size was normal in size. There is mild left ventricular hypertrophy. Left ventricular diastolic parameters are consistent with Grade III  diastolic dysfunction (restrictive). Elevated left ventricular end-diastolic pressure.  Right Ventricle: The right ventricular size is normal. No increase in right ventricular wall thickness. Right ventricular systolic function is low normal. There is mildly elevated pulmonary artery systolic pressure. The tricuspid regurgitant velocity is 3.21 m/s, and with an assumed right atrial pressure of 3 mmHg, the estimated right ventricular systolic pressure is 99991111 mmHg.  Left Atrium: Left atrial size was severely dilated.  Right Atrium: Right atrial size was normal in size.  Pericardium: A moderately sized pericardial effusion is present. The pericardial effusion  is circumferential. There is diastolic collapse of the right atrial wall and diastolic collapse of the right ventricular free wall. There is no evidence of cardiac tamponade.  Mitral Valve: The mitral valve is abnormal. There is mild thickening of the anterior and posterior mitral valve leaflet(s). Moderate mitral valve regurgitation.  Tricuspid Valve: The tricuspid valve is grossly normal. Tricuspid valve regurgitation is mild.  Aortic Valve: The aortic valve is tricuspid. Aortic valve regurgitation is trivial.  Pulmonic Valve: The pulmonic valve was grossly normal. Pulmonic valve regurgitation is trivial.  Aorta: The aortic root and ascending aorta are structurally normal, with no evidence of dilitation.  Venous: The inferior vena cava is normal in size with greater than 50% respiratory variability, suggesting right atrial pressure of 3 mmHg.  IAS/Shunts: No atrial level shunt detected by color flow Doppler.   LEFT VENTRICLE PLAX 2D                        Biplane EF (MOD) LVIDd:         5.40 cm         LV Biplane EF:   Left LVIDs:         3.90 cm                          ventricular LV PW:         1.30 cm                          ejection LV IVS:        1.10 cm                          fraction by LVOT diam:     2.00 cm                           2D MOD LV SV:         47                               biplane is LV SV Index:   24                               37.1 %. LVOT Area:     3.14 cm Diastology LV e' medial:    5.98 cm/s LV Volumes (MOD)               LV E/e' medial:  15.6 LV vol d, MOD    125.5 ml      LV e' lateral:   8.49 cm/s A2C:                           LV E/e' lateral: 11.0 LV vol d, MOD    140.8 ml A4C: LV vol s, MOD    76.4 ml A2C: LV vol s, MOD    88.4 ml A4C: LV SV MOD A2C:   49.1 ml LV SV MOD A4C:   140.8 ml LV SV MOD BP:    50.3 ml  RIGHT VENTRICLE RV S prime:     10.30 cm/s TAPSE (M-mode): 1.6 cm  LEFT ATRIUM  Index        RIGHT ATRIUM           Index LA diam:        4.30 cm  2.23 cm/m   RA Area:     19.80 cm LA Vol (A2C):   94.0 ml  48.67 ml/m  RA Volume:   51.10 ml  26.46 ml/m LA Vol (A4C):   102.0 ml 52.82 ml/m LA Biplane Vol: 107.0 ml 55.40 ml/m AORTIC VALVE LVOT Vmax:   75.60 cm/s LVOT Vmean:  48.600 cm/s LVOT VTI:    0.150 m  AORTA Ao Root diam: 4.10 cm  MITRAL VALVE               TRICUSPID VALVE MV Area (PHT): 5.66 cm    TR Peak grad:   41.2 mmHg MV Decel Time: 134 msec    TR Vmax:        321.00 cm/s MV E velocity: 93.30 cm/s MV A velocity: 30.20 cm/s  SHUNTS MV E/A ratio:  3.09        Systemic VTI:  0.15 m Systemic Diam: 2.00 cm  Zoila Shutter MD Electronically signed by Zoila Shutter MD Signature Date/Time: 08/06/2022/4:42:49 PM    Final     Patient Profile     71 y.o. 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      Riley Lam, MD FASE Cardiologist [facility]  [address] [place], [address] [place], [place] [address] [phone]  9:56 AM

## 2022-08-11 NOTE — Plan of Care (Signed)
  Problem: Safety: Goal: Non-violent Restraint(s) Outcome: Progressing   Problem: Education: Goal: Knowledge of secondary prevention will improve (SELECT ALL) Outcome: Progressing   Problem: Coping: Goal: Will verbalize positive feelings about self Outcome: Progressing   Problem: Self-Care: Goal: Ability to participate in self-care as condition permits will improve Outcome: Progressing Goal: Ability to communicate needs accurately will improve Outcome: Progressing   Problem: Nutrition: Goal: Risk of aspiration will decrease Outcome: Progressing

## 2022-08-11 NOTE — Progress Notes (Signed)
Physical Therapy Treatment Patient Details Name: Dustin Blanchard MRN: [id number] DOB: 17-Mar-1951 Today's Date: 08/11/2022   History of Present Illness The pt is a 71 yo male presenting 8/17 after a fall with slurred speech and R-sided weakness with L gaze. 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 Hospital course complicated by new onset afib with RVR, bradycardia, awaiting EP consult. PMH: hypertension, hyperlipidemia, sinus bradycardia, BPH, vascular dementia (dx in Dec, 2022 per family)    PT Comments    Received pt sitting in recliner with wife present and involved during session. Pt with bilateral wrist restraints, mittens, and waist restraint donned (but loose) to prevent pulling at lines/leads/cortrak. Pt appeared restless and eager to get out of recliner but continues to be limited by aphasia. Pt stood with RW and min A and ambulated 127ft with RW and mod A +3. Pt required +1 for recliner follow and +1 to manage lines/leads while therapist provided mod A to steer RW due to significant R inattention. Pt with poor carry over of cues for RW safety and for upright posture. Pt would benefit from continued rehab in [facility] to address current deficits prior to returning home. Acute PT to cont to follow.       Recommendations for follow up therapy are one component of a multi-disciplinary discharge planning process, led by the attending physician.  Recommendations may be updated based on patient status, additional functional criteria and insurance authorization.  Follow Up Recommendations  Acute inpatient rehab (3hours/day)     Assistance Recommended at Discharge Frequent or constant Supervision/Assistance  Patient can return home with the following A lot of help with walking and/or transfers;A lot of help with  bathing/dressing/bathroom;Assistance with cooking/housework;Assistance with feeding;Assist for transportation;Direct supervision/assist for financial management;Direct supervision/assist for medications management;Help with stairs or ramp for entrance   Equipment Recommendations  Other (comment) (TBD)    Recommendations for Other Services Rehab consult     Precautions / Restrictions Precautions Precautions: Fall Precaution Comments: R inattention Restrictions Weight Bearing Restrictions: No     Mobility  Bed Mobility               General bed mobility comments: in recliner upon entry and exit Patient Response: Restless, Cooperative  Transfers Overall transfer level: Needs assistance Equipment used: Rolling walker (2 wheels) Transfers: Sit to/from Stand Sit to Stand: Min assist           General transfer comment: stood from recliner with RW and min A but required max hand over hand cues for hand placement on RW and pt attempting to pull at lines/leads    Ambulation/Gait Ambulation/Gait assistance: +2 safety/equipment, Mod assist Gait Distance (Feet): 125 Feet Assistive device: Rolling walker (2 wheels) Gait Pattern/deviations: Step-through pattern, Narrow base of support, Shuffle, Drifts right/left, Decreased step length - right, Decreased step length - left, Trunk flexed Gait velocity: decreased Gait velocity interpretation: <1.31 ft/sec, indicative of household ambulator   General Gait Details: pt required mod A to steer RW as pt with significant R inattention. Required +2 for recliner follow and +3 to manage lines/leads. Pt required cues for upright posture/gaze but with poor carry over of cues   Stairs             Wheelchair Mobility    Modified Rankin (Stroke Patients Only) Modified Rankin (Stroke Patients Only) Pre-Morbid Rankin Score: No symptoms  Modified Rankin: Moderately severe disability     Balance Overall balance assessment: Needs  assistance Sitting-balance support: Feet supported, Bilateral upper extremity supported Sitting balance-Leahy Scale: Fair Sitting balance - Comments: sitting at edge of recliner with close supervision   Standing balance support: During functional activity, Bilateral upper extremity supported (RW) Standing balance-Leahy Scale: Poor Standing balance comment: pt able to maintain static standing balance with min guard but required min A/mod A for dynamic standing balance (mainly to control/steer RW)                            Cognition Arousal/Alertness: Awake/alert Behavior During Therapy: Flat affect Overall Cognitive Status: Difficult to assess                                 General Comments: pt moaning and appeared restless and eager to get up from recliner        Exercises      General Comments General comments (skin integrity, edema, etc.): VSS; no verbalizations however making humming sounds; became tearful one time during session - apparently frustrated regading his deficits      Pertinent Vitals/Pain Pain Assessment Pain Assessment: Faces Faces Pain Scale: Hurts a little bit Pain Location: discomfort form coretrack Pain Descriptors / Indicators: Discomfort, Grimacing Pain Intervention(s): Limited activity within patient's tolerance, Repositioned    Home Living                          Prior Function            PT Goals (current goals can now be found in the care plan section) Acute Rehab PT Goals Patient Stated Goal: rehab and return home PT Goal Formulation: With patient Time For Goal Achievement: 08/21/22 Potential to Achieve Goals: Good Progress towards PT goals: Progressing toward goals    Frequency    Min 4X/week      PT Plan Current plan remains appropriate    Co-evaluation              AM-PAC PT "6 Clicks" Mobility   Outcome Measure                   End of Session Equipment Utilized During  Treatment: Gait belt Activity Tolerance: Patient tolerated treatment well Patient left: with call bell/phone within reach;with family/visitor present;in chair;with chair alarm set Nurse Communication: Mobility status PT Visit Diagnosis: Unsteadiness on feet (R26.81);Other abnormalities of gait and mobility (R26.89);Muscle weakness (generalized) (M62.81);Ataxic gait (R26.0);Hemiplegia and hemiparesis Hemiplegia - Right/Left: Right Hemiplegia - dominant/non-dominant: Dominant Hemiplegia - caused by: Cerebral infarction     Time: 7939-0300 PT Time Calculation (min) (ACUTE ONLY): 34 min  Charges:  $Gait Training: 8-22 mins $Therapeutic Activity: 8-22 mins                     Raechel Chute PT, DPT  Alfonso Patten 08/11/2022, 10:51 AM

## 2022-08-11 NOTE — Progress Notes (Signed)
Occupational Therapy Treatment Patient Details Name: Dustin Blanchard MRN: [id number] DOB: 29-Jul-1951 Today's Date: 08/11/2022   History of present illness The pt is a 71 yo male presenting 8/17 after a fall with slurred speech and R-sided weakness with L gaze. 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 Hospital course complicated by new onset afib with RVR, bradycardia, awaiting EP consult. PMH: hypertension, hyperlipidemia, sinus bradycardia, BPH, vascular dementia (dx in Dec, 2022 per family)   OT comments  Wife present during session. Pt appears overall weaker, most likely from immobility, however improved ability to follow commands and participate with simple, familiar ADL tasks. Attempting to use R hemiplegic U/LE during functional tasks. Improved ability to sustain visual attention @ midline, however continues to demonstrate R inattention. Pt very motivated to work with OT and has excellent family support. Pt will benefit from intensive rehab at [facility] to maximize functional level of independence with goal to DC home with 247 physical assist. Will continue to follow. Wife very appreciative.    Recommendations for follow up therapy are one component of a multi-disciplinary discharge planning process, led by the attending physician.  Recommendations may be updated based on patient status, additional functional criteria and insurance authorization.    Follow Up Recommendations  Acute inpatient rehab (3hours/day)    Assistance Recommended at Discharge Frequent or constant Supervision/Assistance  Patient can return home with the following  A lot of help with walking and/or transfers;A lot of help with bathing/dressing/bathroom;Assistance with cooking/housework;Assistance with feeding;Direct supervision/assist for medications  management;Direct supervision/assist for financial management;Assist for transportation;Help with stairs or ramp for entrance   Equipment Recommendations  None recommended by OT    Recommendations for Other Services Rehab consult    Precautions / Restrictions Precautions Precautions: Fall Precaution Comments: R inattention       Mobility Bed Mobility Overal bed mobility: Needs Assistance Bed Mobility: Supine to Sit, Rolling Rolling: Min assist; tactile cvues to locate rail to R   Supine to sit: Min assist     General bed mobility comments: appears weaker with bed mobility; follows multimodal cues well    Transfers Overall transfer level: Needs assistance   Transfers: Sit to/from Stand Sit to Stand: Min assist           General transfer comment: improved awareness fo self in space during mobility this session     Balance Overall balance assessment: Needs assistance   Sitting balance-Leahy Scale: Fair       Standing balance-Leahy Scale: Poor                             ADL either performed or assessed with clinical judgement   ADL Overall ADL's : Needs assistance/impaired Eating/Feeding: NPO (coretrack)   Grooming: Moderate assistance   Upper Body Bathing: Moderate assistance   Lower Body Bathing: Moderate assistance   Upper Body Dressing : Moderate assistance   Lower Body Dressing: Moderate assistance   Toilet Transfer: Minimal assistance;Ambulation   Toileting- Clothing Manipulation and Hygiene: Total assistance Toileting - Clothing Manipulation Details (indicate cue type and reason): incontinenet of BM; wife states her husband was very "fidgity" earlier and she feels he was most likely needing to have a BM; May benefit fomr use of picture communication baord -     Functional mobility during ADLs: Minimal assistance;+2  for safety/equipment      Extremity/Trunk Assessment Upper Extremity Assessment Upper Extremity Assessment: RUE  deficits/detail;LUE deficits/detail RUE Deficits / Details: increased movemetn noted RUE - attmepting to use to donn socks; ablet o grasp sock to assist with pulling uip; poor in-hand manipulation skills LUE Deficits / Details: generalized weakness; appears weaker than on eval; will further assess   Lower Extremity Assessment Lower Extremity Assessment: Defer to PT evaluation        Vision   Vision Assessment?: Vision impaired- to be further tested in functional context Additional Comments: L gaze preference; improved gaze to midline; most likely R field cut   Perception Perception Perception: Impaired (R inattention; recommend trialing L field taped with 3 M tape to bring eyes to midline and increase attention to R field) - discussed with wife   Praxis Praxis Praxis: Impaired Praxis Impairment Details: Motor planning;Ideomotor Praxis-Other Comments: does well with backward chaining    Cognition Arousal/Alertness: Awake/alert Behavior During Therapy: Flat affect Overall Cognitive Status: Difficult to assess                                 General Comments: attempting to complete functional tasks however diffiuclty completing tasks on command        Exercises      Shoulder Instructions       General Comments VSS; no verbalizations however making humming sounds; became tearful one time during session - apparently frustrated regading his deficits    Pertinent Vitals/ Pain       Pain Assessment Pain Assessment: Faces Faces Pain Scale: Hurts a little bit Pain Location: discomfort form coretrack Pain Descriptors / Indicators: Discomfort, Grimacing Pain Intervention(s): Limited activity within patient's tolerance  Home Living                                          Prior Functioning/Environment              Frequency  Min 2X/week        Progress Toward Goals  OT Goals(current goals can now be found in the care plan  section)  Progress towards OT goals: Progressing toward goals  Acute Rehab OT Goals Patient Stated Goal: to go to rehab OT Goal Formulation: With patient/family Time For Goal Achievement: 08/21/22 Potential to Achieve Goals: Good ADL Goals Pt Will Perform Grooming: with set-up;with supervision;sitting Pt Will Perform Upper Body Bathing: with set-up;with supervision;sitting Pt Will Perform Lower Body Bathing: with set-up;sit to/from stand;with supervision Pt Will Perform Lower Body Dressing: with supervision;with set-up;sit to/from stand Pt Will Transfer to Toilet: with supervision;ambulating Additional ADL Goal #1: Pt will obtain objects located in R filed with min verbal adn tactile cues using L occlusion glasses as needed.  Plan Discharge plan remains appropriate    Co-evaluation                 AM-PAC OT "6 Clicks" Daily Activity     Outcome Measure   Help from another person eating meals?: Total Help from another person taking care of personal grooming?: A Lot Help from another person toileting, which includes using toliet, bedpan, or urinal?: Total Help from another person bathing (including washing, rinsing, drying)?: A Lot Help from another person to put on and taking off regular upper body clothing?: A Lot Help from another person to  put on and taking off regular lower body clothing?: A Lot 6 Click Score: 10    End of Session Equipment Utilized During Treatment: Gait belt  OT Visit Diagnosis: Unsteadiness on feet (R26.81);Other abnormalities of gait and mobility (R26.89);Muscle weakness (generalized) (M62.81);Apraxia (R48.2);Feeding difficulties (R63.3);Other symptoms and signs involving cognitive function;Hemiplegia and hemiparesis Hemiplegia - Right/Left: Right Hemiplegia - dominant/non-dominant: Dominant Hemiplegia - caused by: Cerebral infarction;Nontraumatic SAH   Activity Tolerance Patient tolerated treatment well   Patient Left in chair;with call  bell/phone within reach;with chair alarm set;with restraints reapplied (per nsg OK to leave off wrist restraints and posey belt when wife in the room; mittens applied)   Nurse Communication Mobility status        Time: [id number] OT Time Calculation (min): 35 min  Charges: OT General Charges $OT Visit: 1 Visit OT Treatments $Self Care/Home Management : 23-37 mins  Maurie Boettcher, OT/L   Acute OT Clinical Specialist [place] Pager [phone] Office [phone]   [facility] 08/11/2022, 9:25 AM

## 2022-08-11 NOTE — Progress Notes (Signed)
Patient HR was down to 47-49 while sleeping and appears comfortable. BP was stable. Still on amiodarone drip at 16.7 ml/hr. On call cardiology made aware. Will continue to monitor.

## 2022-08-12 ENCOUNTER — Inpatient Hospital Stay (HOSPITAL_COMMUNITY): Payer: Medicare HMO

## 2022-08-12 ENCOUNTER — Inpatient Hospital Stay (HOSPITAL_COMMUNITY)
Admission: RE | Admit: 2022-08-12 | Discharge: 2022-08-25 | DRG: 056 | Disposition: A | Discharge status: Home / Self Care | Payer: Medicare HMO | Source: Intra-hospital | Attending: Physical Medicine and Rehabilitation | Admitting: Physical Medicine and Rehabilitation

## 2022-08-12 ENCOUNTER — Encounter (HOSPITAL_COMMUNITY): Payer: Self-pay | Admitting: Physical Medicine and Rehabilitation

## 2022-08-12 ENCOUNTER — Other Ambulatory Visit: Payer: Self-pay

## 2022-08-12 DIAGNOSIS — Z7901 Long term (current) use of anticoagulants: Secondary | ICD-10-CM

## 2022-08-12 DIAGNOSIS — I69320 Aphasia following cerebral infarction: Secondary | ICD-10-CM

## 2022-08-12 DIAGNOSIS — I69392 Facial weakness following cerebral infarction: Secondary | ICD-10-CM | POA: Diagnosis not present

## 2022-08-12 DIAGNOSIS — I5041 Acute combined systolic (congestive) and diastolic (congestive) heart failure: Secondary | ICD-10-CM | POA: Diagnosis not present

## 2022-08-12 DIAGNOSIS — R32 Unspecified urinary incontinence: Secondary | ICD-10-CM | POA: Diagnosis present

## 2022-08-12 DIAGNOSIS — I110 Hypertensive heart disease with heart failure: Secondary | ICD-10-CM | POA: Diagnosis present

## 2022-08-12 DIAGNOSIS — N400 Enlarged prostate without lower urinary tract symptoms: Secondary | ICD-10-CM | POA: Diagnosis present

## 2022-08-12 DIAGNOSIS — R001 Bradycardia, unspecified: Secondary | ICD-10-CM | POA: Diagnosis not present

## 2022-08-12 DIAGNOSIS — I4891 Unspecified atrial fibrillation: Secondary | ICD-10-CM | POA: Diagnosis present

## 2022-08-12 DIAGNOSIS — R1312 Dysphagia, oropharyngeal phase: Secondary | ICD-10-CM | POA: Diagnosis not present

## 2022-08-12 DIAGNOSIS — F0150 Vascular dementia without behavioral disturbance: Secondary | ICD-10-CM | POA: Diagnosis present

## 2022-08-12 DIAGNOSIS — Z741 Need for assistance with personal care: Secondary | ICD-10-CM | POA: Diagnosis present

## 2022-08-12 DIAGNOSIS — R451 Restlessness and agitation: Secondary | ICD-10-CM | POA: Diagnosis not present

## 2022-08-12 DIAGNOSIS — I509 Heart failure, unspecified: Secondary | ICD-10-CM | POA: Diagnosis present

## 2022-08-12 DIAGNOSIS — F0153 Vascular dementia, unspecified severity, with mood disturbance: Secondary | ICD-10-CM | POA: Diagnosis present

## 2022-08-12 DIAGNOSIS — R739 Hyperglycemia, unspecified: Secondary | ICD-10-CM | POA: Diagnosis not present

## 2022-08-12 DIAGNOSIS — D72829 Elevated white blood cell count, unspecified: Secondary | ICD-10-CM | POA: Diagnosis present

## 2022-08-12 DIAGNOSIS — I63512 Cerebral infarction due to unspecified occlusion or stenosis of left middle cerebral artery: Secondary | ICD-10-CM | POA: Diagnosis not present

## 2022-08-12 DIAGNOSIS — J189 Pneumonia, unspecified organism: Secondary | ICD-10-CM | POA: Diagnosis present

## 2022-08-12 DIAGNOSIS — R1310 Dysphagia, unspecified: Secondary | ICD-10-CM | POA: Diagnosis present

## 2022-08-12 DIAGNOSIS — Z87891 Personal history of nicotine dependence: Secondary | ICD-10-CM

## 2022-08-12 DIAGNOSIS — I69391 Dysphagia following cerebral infarction: Principal | ICD-10-CM

## 2022-08-12 DIAGNOSIS — F05 Delirium due to known physiological condition: Secondary | ICD-10-CM | POA: Diagnosis present

## 2022-08-12 DIAGNOSIS — Z79899 Other long term (current) drug therapy: Secondary | ICD-10-CM

## 2022-08-12 DIAGNOSIS — E785 Hyperlipidemia, unspecified: Secondary | ICD-10-CM | POA: Diagnosis present

## 2022-08-12 DIAGNOSIS — I340 Nonrheumatic mitral (valve) insufficiency: Secondary | ICD-10-CM | POA: Diagnosis present

## 2022-08-12 DIAGNOSIS — I455 Other specified heart block: Secondary | ICD-10-CM | POA: Diagnosis not present

## 2022-08-12 DIAGNOSIS — N390 Urinary tract infection, site not specified: Secondary | ICD-10-CM | POA: Diagnosis not present

## 2022-08-12 DIAGNOSIS — Z781 Physical restraint status: Secondary | ICD-10-CM

## 2022-08-12 DIAGNOSIS — I69318 Other symptoms and signs involving cognitive functions following cerebral infarction: Secondary | ICD-10-CM

## 2022-08-12 DIAGNOSIS — N3000 Acute cystitis without hematuria: Secondary | ICD-10-CM | POA: Diagnosis not present

## 2022-08-12 LAB — COMPREHENSIVE METABOLIC PANEL
ALT: 15 U/L (ref 0–44)
AST: 18 U/L (ref 15–41)
Albumin: 2.8 g/dL — ABNORMAL LOW (ref 3.5–5.0)
Alkaline Phosphatase: 52 U/L (ref 38–126)
Anion gap: 5 (ref 5–15)
BUN: 20 mg/dL (ref 8–23)
CO2: 28 mmol/L (ref 22–32)
Calcium: 8.8 mg/dL — ABNORMAL LOW (ref 8.9–10.3)
Chloride: 109 mmol/L (ref 98–111)
Creatinine, Ser: 0.99 mg/dL (ref 0.61–1.24)
GFR, Estimated: >60 mL/min (ref >60)
Glucose, Bld: 100 mg/dL — ABNORMAL HIGH (ref 70–99)
Potassium: 4 mmol/L (ref 3.5–5.1)
Sodium: 142 mmol/L (ref 135–145)
Total Bilirubin: 1.3 mg/dL — ABNORMAL HIGH (ref 0.3–1.2)
Total Protein: 5.3 g/dL — ABNORMAL LOW (ref 6.5–8.1)

## 2022-08-12 LAB — GLUCOSE, CAPILLARY
Glucose-Capillary: 103 mg/dL — ABNORMAL HIGH (ref 70–99)
Glucose-Capillary: 110 mg/dL — ABNORMAL HIGH (ref 70–99)
Glucose-Capillary: 117 mg/dL — ABNORMAL HIGH (ref 70–99)
Glucose-Capillary: 124 mg/dL — ABNORMAL HIGH (ref 70–99)

## 2022-08-12 LAB — MAGNESIUM: Magnesium: 2.2 mg/dL (ref 1.7–2.4)

## 2022-08-12 MED ORDER — QUETIAPINE FUMARATE 25 MG PO TABS: 25.0000 mg | ORAL_TABLET | Freq: Every day | ORAL | 1 refills | Status: DC

## 2022-08-12 MED ORDER — AMIODARONE HCL 200 MG PO TABS
400.0000 mg | ORAL_TABLET | Freq: Two times a day (BID) | ORAL | Status: DC
  Administered 2022-08-13 – 2022-08-15 (×6): 400 mg
  Filled 2022-08-12 (×6): qty 2

## 2022-08-12 MED ORDER — ACETAMINOPHEN 160 MG/5ML PO SOLN
650.0000 mg | ORAL | Status: DC | PRN
  Administered 2022-08-14 – 2022-08-16 (×3): 650 mg
  Filled 2022-08-12 (×3): qty 20.3

## 2022-08-12 MED ORDER — ORAL CARE MOUTH RINSE: 15.0000 mL | OROMUCOSAL | Status: DC | PRN

## 2022-08-12 MED ORDER — ACETAMINOPHEN 650 MG RE SUPP: 650.0000 mg | RECTAL | Status: DC | PRN

## 2022-08-12 MED ORDER — PROSOURCE TF20 ENFIT COMPATIBL EN LIQD
60.0000 mL | Freq: Two times a day (BID) | ENTERAL | Status: DC
  Administered 2022-08-13 (×2): 60 mL
  Filled 2022-08-12 (×2): qty 60

## 2022-08-12 MED ORDER — ADULT MULTIVITAMIN W/MINERALS CH
1.0000 | ORAL_TABLET | Freq: Every day | ORAL | Status: DC
  Administered 2022-08-13 – 2022-08-19 (×5): 1
  Filled 2022-08-12 (×5): qty 1

## 2022-08-12 MED ORDER — SPIRONOLACTONE 25 MG PO TABS: 25.0000 mg | ORAL_TABLET | Freq: Every day | ORAL | 1 refills | Status: DC

## 2022-08-12 MED ORDER — ADULT MULTIVITAMIN W/MINERALS CH: 1.0000 | ORAL_TABLET | Freq: Every day | ORAL | 1 refills | Status: DC

## 2022-08-12 MED ORDER — QUETIAPINE FUMARATE 25 MG PO TABS
25.0000 mg | ORAL_TABLET | Freq: Every day | ORAL | Status: DC
  Administered 2022-08-16 – 2022-08-18 (×2): 25 mg
  Filled 2022-08-12 (×2): qty 1

## 2022-08-12 MED ORDER — OSMOLITE 1.5 CAL PO LIQD
1000.0000 mL | ORAL | Status: DC
  Administered 2022-08-13: 1000 mL

## 2022-08-12 MED ORDER — OSMOLITE 1.5 CAL PO LIQD: 1000.0000 mL | ORAL | 0 refills | Status: DC

## 2022-08-12 MED ORDER — SPIRONOLACTONE 25 MG PO TABS
25.0000 mg | ORAL_TABLET | Freq: Every day | ORAL | Status: DC
  Administered 2022-08-12: 25 mg
  Filled 2022-08-12: qty 1

## 2022-08-12 MED ORDER — VALPROIC ACID 250 MG/5ML PO SOLN: 250.0000 mg | Freq: Three times a day (TID) | ORAL | 1 refills | Status: DC

## 2022-08-12 MED ORDER — ATORVASTATIN CALCIUM 40 MG PO TABS
40.0000 mg | ORAL_TABLET | Freq: Every day | ORAL | Status: DC
  Administered 2022-08-13 – 2022-08-19 (×5): 40 mg
  Filled 2022-08-12 (×5): qty 1

## 2022-08-12 MED ORDER — AMIODARONE HCL 200 MG PO TABS: 200.0000 mg | ORAL_TABLET | Freq: Every day | ORAL | Status: DC

## 2022-08-12 MED ORDER — PANTOPRAZOLE 2 MG/ML SUSPENSION
40.0000 mg | Freq: Every day | ORAL | Status: DC
  Administered 2022-08-13 – 2022-08-18 (×6): 40 mg
  Filled 2022-08-12 (×5): qty 20

## 2022-08-12 MED ORDER — VALPROIC ACID 250 MG/5ML PO SOLN
250.0000 mg | Freq: Three times a day (TID) | ORAL | Status: DC
  Administered 2022-08-13 (×2): 250 mg
  Filled 2022-08-12 (×2): qty 5

## 2022-08-12 MED ORDER — AMIODARONE HCL 400 MG PO TABS: 400.0000 mg | ORAL_TABLET | Freq: Two times a day (BID) | ORAL | 0 refills | Status: DC

## 2022-08-12 MED ORDER — QUETIAPINE FUMARATE 25 MG PO TABS: 12.5000 mg | ORAL_TABLET | Freq: Three times a day (TID) | ORAL | 1 refills | Status: DC | PRN

## 2022-08-12 MED ORDER — APIXABAN 5 MG PO TABS
5.0000 mg | ORAL_TABLET | Freq: Two times a day (BID) | ORAL | Status: DC
  Administered 2022-08-13 – 2022-08-19 (×11): 5 mg
  Filled 2022-08-12 (×11): qty 1

## 2022-08-12 MED ORDER — SPIRONOLACTONE 25 MG PO TABS
25.0000 mg | ORAL_TABLET | Freq: Every day | ORAL | Status: DC
  Administered 2022-08-13 – 2022-08-19 (×5): 25 mg
  Filled 2022-08-12 (×5): qty 1

## 2022-08-12 MED ORDER — APIXABAN 5 MG PO TABS: 5.0000 mg | ORAL_TABLET | Freq: Two times a day (BID) | ORAL | 1 refills | Status: DC

## 2022-08-12 MED ORDER — ATORVASTATIN CALCIUM 40 MG PO TABS: 40.0000 mg | ORAL_TABLET | Freq: Every day | ORAL | 1 refills | Status: DC

## 2022-08-12 MED ORDER — ACETAMINOPHEN 325 MG PO TABS
650.0000 mg | ORAL_TABLET | ORAL | Status: DC | PRN
  Administered 2022-08-16 – 2022-08-24 (×3): 650 mg via ORAL
  Filled 2022-08-12 (×3): qty 2

## 2022-08-12 MED ORDER — PROSOURCE TF20 ENFIT COMPATIBL EN LIQD: 60.0000 mL | Freq: Two times a day (BID) | ENTERAL | 0 refills | Status: DC

## 2022-08-12 MED ORDER — LORAZEPAM 2 MG/ML IJ SOLN
1.0000 mg | Freq: Once | INTRAMUSCULAR | Status: AC
Start: 2022-08-12 — End: 2022-08-12
  Administered 2022-08-12: 1 mg via INTRAVENOUS
  Filled 2022-08-12: qty 1

## 2022-08-12 MED ORDER — QUETIAPINE FUMARATE 25 MG PO TABS
12.5000 mg | ORAL_TABLET | Freq: Three times a day (TID) | ORAL | Status: DC | PRN
  Administered 2022-08-13 – 2022-08-15 (×2): 12.5 mg
  Filled 2022-08-12 (×3): qty 1

## 2022-08-12 MED ORDER — SENNOSIDES-DOCUSATE SODIUM 8.6-50 MG PO TABS: 1.0000 | ORAL_TABLET | Freq: Two times a day (BID) | ORAL | Status: DC | PRN

## 2022-08-12 MED ORDER — AMIODARONE HCL 200 MG PO TABS: 200.0000 mg | ORAL_TABLET | Freq: Every day | ORAL | 1 refills | Status: DC

## 2022-08-12 NOTE — Progress Notes (Signed)
PMR Admission Coordinator Pre-Admission Assessment   Patient: Dustin Blanchard is an 71 y.o., male MRN: [id number] DOB: 12/21/1951 Height: 6' (182.9 cm) Weight: 67.8 kg   Insurance Information HMO:     PPO:yes      PCP:      IPA:      80/20:      OTHER:  PRIMARY: [employer] Medicare       Policy#: [id number]      Subscriber: Pt CM Name: Bobbie Stack      Phone#:  [phone]    Fax#:  [phone] Pre-Cert#: [id number]      Employer: approved from 8/22-8/27 with updates due  8/27 Benefits:  Phone #:      Name:  Irene Shipper Date: 12/21/2021 - still active Deductible: does not have OOP Max: $4,500 ($209 met) CIR: $295/day co-pay with a max co-pay of $1,770/admission (6 days) SNF: $0/day co-pay for days 1-20, $196/day co-pay for days 21-100; limited to 100 days/cal yr. Outpatient:  $35/visit co-pay Home Health:  100% coverage DME: 80% coverage; 20% co-insurance Providers: in network    SECONDARY:       Policy#:      Phone#:    Development worker, community:       Phone#:    The Engineer, petroleum" for patients in Inpatient Rehabilitation Facilities with attached "Privacy Act [place] Records" was provided and verbally reviewed with: Family   Emergency Contact Information Contact Information       Name Relation Home Work Helen Spouse [phone]   [phone]    Mackenzie, Lia     [phone]           Current Medical History  Patient Admitting Diagnosis: CVA History of Present Illness:Dustin Blanchard is a 71 year old right-handed male with history of hypertension, hyperlipidemia, sinus bradycardia/chronic CHF with mitral regurgitation, BPH, history of tobacco use, vascular dementia diagnosed December 2022.  Per chart review patient lives with spouse.  1 level home 3 steps to entry.  Independent prior to admission and driving.  Presented 08/06/2022 with acute onset of right-sided weakness and aphasia.  Cranial CT scan showed small age-indeterminate left  PCA infarct at the left occipital lobe.,  CT cervical spine negative.  CT angiogram head and neck positive for left MCA territory oligemia, approximately 120 mL.  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  Hospital course cardiology consulted for new A-fib RVR/CHF.  Echocardiogram showed coarse trabeculation of the apex without obvious thrombus.  Left ventricular ejection fraction 35 to 40%.  No evidence of cardiac tamponade.  Patient initially placed on Cardizem drip close monitoring of bradycardia.  He was switched to amiodarone.  Plan outpatient ischemic work-up after recovery from CVA.  Eliquis has been initiated for CVA prophylaxis as well as atrial fibrillation.  He is currently n.p.o. with alternative means of nutritional support.  Bouts of sundowning with noted history of vascular dementia maintained on Seroquel as well as Depakote.  Therapy evaluations completed due to patient decreased functional mobility right side weakness was admitted for a comprehensive rehab program.   Complete NIHSS TOTAL: 15   Patient's medical record from [facility].  has been reviewed by the rehabilitation admission coordinator and physician.   Past Medical History      Past Medical History:  Diagnosis Date  Dementia ([place])        Has the patient had major surgery during 100 days prior to admission? Yes   Family History   family history is not on file.   Current Medications   Current Facility-Administered Medications:    acetaminophen (TYLENOL) tablet 650 mg, 650 mg, Oral, Q4H PRN **OR** acetaminophen (TYLENOL) 160 MG/5ML solution 650 mg, 650 mg, Per Tube, Q4H PRN **OR** acetaminophen (TYLENOL) suppository 650 mg, 650 mg, Rectal, Q4H PRN, de Sindy Messing, [place], MD   Derrill Memo  ON 08/18/2022] amiodarone (PACERONE) tablet 200 mg, 200 mg, Per Tube, Daily, Chandrasekhar, Mahesh A, MD   amiodarone (PACERONE) tablet 400 mg, 400 mg, Per Tube, BID, Chandrasekhar, Mahesh A, MD, 400 mg at 08/11/22 1214   apixaban (ELIQUIS) tablet 5 mg, 5 mg, Per Tube, BID, [name], MD, 5 mg at 08/11/22 0840   atorvastatin (LIPITOR) tablet 40 mg, 40 mg, Per Tube, Daily, [name], MD, 40 mg at 08/11/22 0840   Chlorhexidine Gluconate Cloth 2 % PADS 6 each, 6 each, Topical, Daily, Bhagat, Srishti L, MD, 6 each at 08/09/22 1000   feeding supplement (OSMOLITE 1.5 CAL) liquid 1,000 mL, 1,000 mL, Per Tube, Continuous, Garvin Fila, MD, Last Rate: 50 mL/hr at 08/10/22 1457, 1,000 mL at 08/10/22 1457   feeding supplement (PROSource TF20) liquid 60 mL, 60 mL, Per Tube, BID, Garvin Fila, MD, 60 mL at 08/11/22 0841   labetalol (NORMODYNE) injection 20 mg, 20 mg, Intravenous, Once **AND** [DISCONTINUED] clevidipine (CLEVIPREX) infusion 0.5 mg/mL, 0-21 mg/hr, Intravenous, Continuous, de Sindy Messing, [place], MD, Stopped at 08/06/22 1941   multivitamin with minerals tablet 1 tablet, 1 tablet, Per Tube, Daily, Garvin Fila, MD, 1 tablet at 08/11/22 0840   Oral care mouth rinse, 15 mL, Mouth Rinse, PRN, Garvin Fila, MD   pantoprazole (PROTONIX) 2 mg/mL oral suspension 40 mg, 40 mg, Per Tube, QHS, Kaleen Mask, RPH, 40 mg at 08/10/22 2133   QUEtiapine (SEROQUEL) tablet 12.5 mg, 12.5 mg, Per Tube, TID PRN, Metzger-Cihelka, Desiree, NP, 12.5 mg at 08/09/22 0107   QUEtiapine (SEROQUEL) tablet 25 mg, 25 mg, Per Tube, Q supper, Metzger-Cihelka, Desiree, NP, 25 mg at 08/10/22 1729   senna-docusate (Senokot-S) tablet 1 tablet, 1 tablet, Oral, BID PRN, Bhagat, Srishti L, MD   spironolactone (ALDACTONE) tablet 12.5 mg, 12.5 mg, Per Tube, Daily, Chandrasekhar, Mahesh A, MD, 12.5 mg at 08/11/22 1214   valproic acid (DEPAKENE) 250 MG/5ML solution 250 mg, 250 mg, Per Tube, TID Haze Rushing, MD, 250  mg at 08/11/22 1214   Patients Current Diet:  Diet Order                  Diet NPO time specified  Diet effective now                         Precautions / Restrictions Precautions Precautions: Fall Precaution Comments: R inattention Restrictions Weight Bearing Restrictions: No Other Position/Activity Restrictions: SBP 120-150    Has the patient had 2 or more falls or a fall with injury in the past year? Yes   Prior Activity Level Community (5-7x/wk): Pt. active n the community PTA   Prior Functional Level Self Care: Did the patient need help bathing, dressing, using the toilet or eating? Needed some help   Indoor Mobility: Did the patient need assistance with walking from room to room (with or without device)? Independent   Stairs: Did the patient need  assistance with internal or external stairs (with or without device)? Needed some help   Functional Cognition: Did the patient need help planning regular tasks such as shopping or remembering to take medications? Needed some help   Patient Information Are you of Hispanic, Latino/a,or Spanish origin?: A. No, not of Hispanic, Latino/a, or Spanish origin (Information from proxy) What is your race?: B. Black or African American Do you need or want an interpreter to communicate with a doctor or health care staff?: 0. No   Patient's Response To:  Health Literacy and Transportation Is the patient able to respond to health literacy and transportation needs?: No Health Literacy - How often do you need to have someone help you when you read instructions, pamphlets, or other written material from your doctor or pharmacy?: Patient unable to respond In the past 12 months, has lack of transportation kept you from medical appointments or from getting medications?: No (information from proxy) In the past 12 months, has lack of transportation kept you from meetings, work, or from getting things needed for daily living?: No   Water quality scientist / Equipment Home Equipment: Cane - single point, BSC/3in1, Civil engineer, contracting   Prior Device Use: Indicate devices/aids used by the patient prior to current illness, exacerbation or injury? None of the above   Current Functional Level Cognition   Arousal/Alertness:  (due to aphasia) Overall Cognitive Status: Difficult to assess Difficult to assess due to: Impaired communication Orientation Level: Disoriented X4 General Comments: pt moaning and appeared restless and eager to get up from recliner Attention: Focused Focused Attention: Impaired Focused Attention Impairment: Verbal complex    Extremity Assessment (includes Sensation/Coordination)   Upper Extremity Assessment: RUE deficits/detail, LUE deficits/detail RUE Deficits / Details: increased movemetn noted RUE - attmepting to use to donn socks; ablet o grasp sock to assist with pulling uip; poor in-hand manipulation skills RUE Sensation: decreased light touch RUE Coordination: decreased fine motor, decreased gross motor LUE Deficits / Details: generalized weakness  Lower Extremity Assessment: Defer to PT evaluation RLE Deficits / Details: apraxic with movements, limited against gravity but no buckling noted with standing activity. impaired command following further limited assessment. RLE Coordination: decreased gross motor, decreased fine motor     ADLs   Overall ADL's : Needs assistance/impaired Eating/Feeding: NPO (coretrack) Grooming: Moderate assistance Upper Body Bathing: Moderate assistance Lower Body Bathing: Moderate assistance Upper Body Dressing : Moderate assistance Lower Body Dressing: Moderate assistance Toilet Transfer: Minimal assistance, Ambulation Toileting- Clothing Manipulation and Hygiene: Total assistance Toileting - Clothing Manipulation Details (indicate cue type and reason): incontinenet of BM; wife states her husband was very "fidgity" earlier and she feels he was most likely needing to  have a BM; May benefit fomr use of picture communication baord - Functional mobility during ADLs: Minimal assistance, +2 for safety/equipment     Mobility   Overal bed mobility: Needs Assistance Bed Mobility: Supine to Sit, Rolling Rolling: Min assist Supine to sit: Min assist General bed mobility comments: in recliner upon entry and exit     Transfers   Overall transfer level: Needs assistance Equipment used: Rolling walker (2 wheels) Transfers: Sit to/from Stand Sit to Stand: Min assist General transfer comment: stood from recliner with RW and min A but required max hand over hand cues for hand placement on RW and pt attempting to pull at lines/leads     Ambulation / Gait / Stairs / Emergency planning/management officer   Ambulation/Gait Ambulation/Gait assistance: +2 safety/equipment, Mod assist Gait Distance (Feet):  125 Feet Assistive device: Rolling walker (2 wheels) Gait Pattern/deviations: Step-through pattern, Narrow base of support, Shuffle, Drifts right/left, Decreased step length - right, Decreased step length - left, Trunk flexed General Gait Details: pt required mod A to steer RW as pt with significant R inattention. Required +2 for recliner follow and +3 to manage lines/leads. Pt required cues for upright posture/gaze but with poor carry over of cues Gait velocity: decreased Gait velocity interpretation: <1.31 ft/sec, indicative of household ambulator     Posture / Balance Dynamic Sitting Balance Sitting balance - Comments: sitting at edge of recliner with close supervision Balance Overall balance assessment: Needs assistance Sitting-balance support: Feet supported, Bilateral upper extremity supported Sitting balance-Leahy Scale: Fair Sitting balance - Comments: sitting at edge of recliner with close supervision Standing balance support: During functional activity, Bilateral upper extremity supported (RW) Standing balance-Leahy Scale: Poor Standing balance comment: pt able to maintain  static standing balance with min guard but required min A/mod A for dynamic standing balance (mainly to control/steer RW)     Special needs/care consideration Special service needs coretrak    Previous Home Environment (from acute therapy documentation) Living Arrangements: Spouse/significant other  Lives With: Spouse Available Help at Discharge: Family, Available 24 hours/day Type of Home: House Home Layout: One level Home Access: Stairs to enter Entrance Stairs-Rails: None Entrance Stairs-Number of Steps: 3 Bathroom Shower/Tub: Chiropodist: Handicapped height Bathroom Accessibility: Yes How Accessible: Accessible via walker   Discharge Living Setting Plans for Discharge Living Setting: Patient's home Type of Home at Discharge: House Discharge Home Layout: One level Discharge Home Access: Stairs to enter Entrance Stairs-Rails: None Entrance Stairs-Number of Steps: 3 Discharge Bathroom Shower/Tub: Tub/shower unit Discharge Bathroom Toilet: Handicapped height Discharge Bathroom Accessibility: Yes How Accessible: Accessible via walker   Social/Family/Support Systems Patient Roles: Spouse Contact Information: [phone] Anticipated Caregiver: Child psychotherapist (wife Ability/Limitations of Caregiver: 24/7 Caregiver Availability: 24/7 Discharge Plan Discussed with Primary Caregiver: Yes Is Caregiver In Agreement with Plan?: Yes Does Caregiver/Family have Issues with Lodging/Transportation while Pt is in Rehab?: No   Goals Patient/Family Goal for Rehab: PT/OT Supervision, SLP Mod A Expected length of stay: 10-12 days Pt/Family Agrees to Admission and willing to participate: Yes Program Orientation Provided & Reviewed with Pt/Caregiver Including Roles  & Responsibilities: Yes   Decrease burden of Care through IP rehab admission: not anticipated    Possible need for SNF placement upon discharge: n/a   Patient Condition: I have reviewed medical records from  [facility] , spoken with CM, and patient and spouse. I met with patient at the bedside for inpatient rehabilitation assessment.  Patient will benefit from ongoing PT, OT, and SLP, can actively participate in 3 hours of therapy a day 5 days of the week, and can make measurable gains during the admission.  Patient will also benefit from the coordinated team approach during an Inpatient Acute Rehabilitation admission.  The patient will receive intensive therapy as well as Rehabilitation physician, nursing, social worker, and care management interventions.  Due to bladder management, bowel management, safety, skin/wound care, disease management, medication administration, pain management, and patient education the patient requires 24 hour a day rehabilitation nursing.  The patient is currently Min A with mobility and basic ADLs.  Discharge setting and therapy post discharge at home with home health is anticipated.  Patient has agreed to participate in the Acute Inpatient Rehabilitation Program and will admit today.   Preadmission Screen Completed By:  Genella Mech, 08/11/2022  1:09 PM ______________________________________________________________________   Discussed status with Dr. Ranell Patrick  on 08/12/22 at 72 and received approval for admission today.   Admission Coordinator:  Genella Mech, CCC-SLP, time 1200/Date 08/12/22    Assessment/Plan: Diagnosis: Left MCA CVA Does the need for close, 24 hr/day Medical supervision in concert with the patient's rehab needs make it unreasonable for this patient to be served in a less intensive setting? Yes Co-Morbidities requiring supervision/potential complications: sinus cause, acute systolic and diastolic heart failure, afib with RVR, dysphagia Due to bladder management, bowel management, safety, skin/wound care, disease management, medication administration, pain management, and patient education, does the patient require 24 hr/day rehab nursing?  Yes Does the patient require coordinated care of a physician, rehab nurse, PT, OT, and SLP to address physical and functional deficits in the context of the above medical diagnosis(es)? Yes Addressing deficits in the following areas: balance, endurance, locomotion, strength, transferring, bowel/bladder control, bathing, dressing, feeding, grooming, toileting, cognition, speech, swallowing, and psychosocial support Can the patient actively participate in an intensive therapy program of at least 3 hrs of therapy 5 days a week? Yes The potential for patient to make measurable gains while on inpatient rehab is excellent Anticipated functional outcomes upon discharge from inpatient rehab: supervision PT, supervision OT, supervision SLP Estimated rehab length of stay to reach the above functional goals is: 7-9 days Anticipated discharge destination: Home 10. Overall Rehab/Functional Prognosis: excellent     MD Signature: Leeroy Cha, MD

## 2022-08-12 NOTE — Progress Notes (Signed)
Inpatient Rehab Admissions Coordinator:    I have a CIR bed and can admit Pt. Today. RN may call report to [phone].  Megan Salon, MS, CCC-SLP Rehab Admissions Coordinator  [phone] (celll) [phone] (office)

## 2022-08-12 NOTE — Procedures (Signed)
Cortrak ? ?Person Inserting Tube:  Annsleigh Dragoo C, RD ?Tube Type:  Cortrak - 43 inches ?Tube Size:  10 ?Tube Location:  Left nare ?Secured by: Bridle ?Technique Used to Measure Tube Placement:  Marking at nare/corner of mouth ?Cortrak Secured At:  65 cm ? ?Cortrak Tube Team Note: ? ?Consult received to place a Cortrak feeding tube.  ? ?X-ray is required, abdominal x-ray has been ordered by the Cortrak team. Please confirm tube placement before using the Cortrak tube.  ? ?If the tube becomes dislodged please keep the tube and contact the Cortrak team at [email or web address] (password [email or web address]) for replacement.  ?If after hours and replacement cannot be delayed, place a NG tube and confirm placement with an abdominal x-ray.  ? ? ?Monifah Freehling P., RD, LDN, CNSC ?See AMiON for contact information  ? ? ?

## 2022-08-12 NOTE — Discharge Instructions (Addendum)
STROKE/TIA DISCHARGE INSTRUCTIONS SMOKING Cigarette smoking nearly doubles your risk of having a stroke & is the single most alterable risk factor  If you smoke or have smoked in the last 12 months, you are advised to quit smoking for your health. Most of the excess cardiovascular risk related to smoking disappears within a year of stopping. Ask you doctor about anti-smoking medications  Quit Line: 1-800-QUIT NOW Free Smoking Cessation Classes [phone]  CHOLESTEROL Know your levels; limit fat & cholesterol in your diet  Lipid Panel     Component Value Date/Time   CHOL 130 08/07/2022 0241   TRIG 60 08/07/2022 0241   HDL 47 08/07/2022 0241   CHOLHDL 2.8 08/07/2022 0241   VLDL 12 08/07/2022 0241   LDLCALC 71 08/07/2022 0241     Many patients benefit from treatment even if their cholesterol is at goal. Goal: Total Cholesterol (CHOL) less than 160 Goal:  Triglycerides (TRIG) less than 150 Goal:  HDL greater than 40 Goal:  LDL (LDLCALC) less than 100   BLOOD PRESSURE American Stroke Association blood pressure target is less that 120/80 mm/Hg  Your discharge blood pressure is:  BP: 126/72 Monitor your blood pressure Limit your salt and alcohol intake Many individuals will require more than one medication for high blood pressure  DIABETES (A1c is a blood sugar average for last 3 months) Goal HGBA1c is under 7% (HBGA1c is blood sugar average for last 3 months)  Diabetes: No known diagnosis of diabetes    Lab Results  Component Value Date   HGBA1C 4.6 (L) 08/06/2022    Your HGBA1c can be lowered with medications, healthy diet, and exercise. Check your blood sugar as directed by your physician Call your physician if you experience unexplained or low blood sugars.  PHYSICAL ACTIVITY/REHABILITATION Goal is 30 minutes at least 4 days per week  Activity: Increase activity slowly, Therapies: Physical Therapy: Home Health Return to work:  Activity decreases your risk of heart attack  and stroke and makes your heart stronger.  It helps control your weight and blood pressure; helps you relax and can improve your mood. Participate in a regular exercise program. Talk with your doctor about the best form of exercise for you (dancing, walking, swimming, cycling).  DIET/WEIGHT Goal is to maintain a healthy weight  Your discharge diet is:  Diet Order             Diet NPO time specified  Diet effective now                   liquids Your height is:  Height: 6' (182.9 cm) Your current weight is: Weight: 68.2 kg Your Body Mass Index (BMI) is:  BMI (Calculated): 20.39 Following the type of diet specifically designed for you will help prevent another stroke. Your goal weight range is:   Your goal Body Mass Index (BMI) is 19-24. Healthy food habits can help reduce 3 risk factors for stroke:  High cholesterol, hypertension, and excess weight.  RESOURCES Stroke/Support Group:  Call [phone]   STROKE EDUCATION PROVIDED/REVIEWED AND GIVEN TO PATIENT Stroke warning signs and symptoms How to activate emergency medical system (call 911). Medications prescribed at discharge. Need for follow-up after discharge. Personal risk factors for stroke. Pneumonia vaccine given: No Flu vaccine given: No My questions have been answered, the writing is legible, and I understand these instructions.  I will adhere to these goals & educational materials that have been provided to me after my discharge from the hospital.  Inpatient Rehab Discharge Instructions  Dustin Blanchard Discharge date and time: No discharge date for patient encounter.   Activities/Precautions/ Functional Status: Activity: As tolerated Diet: Dysphagia #1 nectar thick liquids Wound Care: Routine skin checks Functional status:  ___ No restrictions     ___ Walk up steps independently ___ 24/7 supervision/assistance   ___ Walk up steps with assistance ___ Intermittent supervision/assistance  ___ Bathe/dress  independently ___ Walk with walker     _x__ Bathe/dress with assistance ___ Walk Independently    ___ Shower independently ___ Walk with assistance    ___ Shower with assistance ___ No alcohol     ___ Return to work/school ________  Special Instructions:   COMMUNITY REFERRALS UPON DISCHARGE:    Home Health:   PT  OT  [facility]                Agency:[facility] Phone: [phone]  Medical Equipment/Items Ordered: TRANSPORT CHAIR, Levan Hurst AND 3 IN 1                                                 Agency/Supplier:[facility]   [phone]    No driving smoking or alcohol  My questions have been answered and I understand these instructions. I will adhere to these goals and the provided educational materials after my discharge from the hospital.  Patient/Caregiver Signature _______________________________ Date __________  Clinician Signature _______________________________________ Date __________  Please bring this form and your medication list with you to all your follow-up doctor's appointments.     Information on my medicine - ELIQUIS (apixaban)  This medication education was reviewed with me or my healthcare representative as part of my discharge preparation.    Why was Eliquis prescribed for you? Eliquis was prescribed for you to reduce the risk of a blood clot forming that can cause a stroke if you have a medical condition called atrial fibrillation (a type of irregular heartbeat).  What do You need to know about Eliquis ? Take your Eliquis TWICE DAILY - one tablet in the morning and one tablet in the evening with or without food. If you have difficulty swallowing the tablet whole please discuss with your pharmacist how to take the medication safely.  Take Eliquis exactly as prescribed by your doctor and DO NOT stop taking Eliquis without talking to the doctor who prescribed the medication.  Stopping may increase your risk of developing a stroke.  Refill  your prescription before you run out.  After discharge, you should have regular check-up appointments with your healthcare provider that is prescribing your Eliquis.  In the future your dose may need to be changed if your kidney function or weight changes by a significant amount or as you get older.  What do you do if you miss a dose? If you miss a dose, take it as soon as you remember on the same day and resume taking twice daily.  Do not take more than one dose of ELIQUIS at the same time to make up a missed dose.  Important Safety Information A possible side effect of Eliquis is bleeding. You should call your healthcare provider right away if you experience any of the following: Bleeding from an injury or your nose that does not stop. Unusual colored urine (red or dark brown) or unusual colored stools (red or black). Unusual bruising  for unknown reasons. A serious fall or if you hit your head (even if there is no bleeding).  Some medicines may interact with Eliquis and might increase your risk of bleeding or clotting while on Eliquis. To help avoid this, consult your healthcare provider or pharmacist prior to using any new prescription or non-prescription medications, including herbals, vitamins, non-steroidal anti-inflammatory drugs (NSAIDs) and supplements.  This website has more information on Eliquis (apixaban): http://www.eliquis.com/eliquis/home

## 2022-08-12 NOTE — Progress Notes (Signed)
Patient admitted to 17w09. Patient nonverbal, wife at bedside and able to assist with admission questions. Full linen and gown change performed due to incontinence. Restraints resumed following personal care. Skin assessment performed. No significant findings. Please see admission assessment in flowsheet. Bed alarms in place. Bed to lowest setting.

## 2022-08-12 NOTE — Progress Notes (Signed)
Nutrition Brief Note  Received a page from charge RN because X-ray noted that the tip of the tube is ~19 cm above the GE junction. Cortrak placer documented that the tube was left at 65 cm. When RN paged RD, tube at 25 cm. RD instructed RN to remove Cortrak tube and NG will have to be placed at bedside by RN.    Kirby Crigler RD, LDN Clinical Dietitian See Loretha Stapler for contact information.

## 2022-08-12 NOTE — TOC Transition Note (Signed)
Transition of Care [facility]) - CM/SW Discharge Note   Patient Details  Name: Dustin Blanchard MRN: [id number] Date of Birth: 1951-09-08  [facility] CM/SW Contact:  Kermit Balo, RN Phone Number: 08/12/2022, 1:09 PM   Clinical Narrative:    Pt is discharging to CIR today. CM signing off.   Final next level of care: IP Rehab Facility Barriers to Discharge: No Barriers Identified   Patient Goals and CMS Choice     Choice offered to / list presented to : Patient  Discharge Placement                       Discharge Plan and Services                                     Social Determinants of Health (SDOH) Interventions     Readmission Risk Interventions     No data to display

## 2022-08-12 NOTE — Progress Notes (Signed)
Physical Therapy Treatment Patient Details Name: Dustin Blanchard MRN: [id number] DOB: 1951-07-26 Today's Date: 08/12/2022   History of Present Illness The pt is a 71 yo male presenting 8/17 after a fall with slurred speech and R-sided weakness with L gaze. 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 Hospital course complicated by new onset afib with RVR, bradycardia, awaiting EP consult. PMH: hypertension, hyperlipidemia, sinus bradycardia, BPH, vascular dementia (dx in Dec, 2022 per family)    PT Comments    Pt has show steady improvement toward goals.  Emphasis on transitions, standing/balance activity at EOB limited by low focus, and progression of gait stability/quality with HHA and use of the rail.    Recommendations for follow up therapy are one component of a multi-disciplinary discharge planning process, led by the attending physician.  Recommendations may be updated based on patient status, additional functional criteria and insurance authorization.  Follow Up Recommendations  Acute inpatient rehab (3hours/day)     Assistance Recommended at Discharge Frequent or constant Supervision/Assistance  Patient can return home with the following A little help with walking and/or transfers;A little help with bathing/dressing/bathroom;Assistance with cooking/housework;Direct supervision/assist for medications management;Direct supervision/assist for financial management;Assist for transportation;Help with stairs or ramp for entrance   Equipment Recommendations  Other (comment) (TBD)    Recommendations for Other Services Rehab consult     Precautions / Restrictions Precautions Precautions: Fall Precaution Comments: R inattention Restrictions Weight Bearing Restrictions: No     Mobility  Bed Mobility Overal bed  mobility: Needs Assistance Bed Mobility: Rolling, Sidelying to Sit, Sit to Supine Rolling: Min assist Sidelying to sit: Min assist   Sit to supine: Min guard   General bed mobility comments: cues for direction/sequencing, assist for initiation of roll/transition via L elbow.    Transfers Overall transfer level: Needs assistance   Transfers: Sit to/from Stand Sit to Stand: Min assist           General transfer comment: cues fo hand placement, use of bed frame for stability.    Ambulation/Gait Ambulation/Gait assistance: Min assist Gait Distance (Feet): 90 Feet (x2) Assistive device: 1 person hand held assist Gait Pattern/deviations: Step-through pattern   Gait velocity interpretation: <1.8 ft/sec, indicate of risk for recurrent falls   General Gait Details: paretic gait on the right with narrowed BOS, drift right/soft stagger right at times.  Lots of distraction and loss of function, decreasing stability.   Stairs             Wheelchair Mobility    Modified Rankin (Stroke Patients Only) Modified Rankin (Stroke Patients Only) Pre-Morbid Rankin Score: No symptoms Modified Rankin: Moderately severe disability     Balance Overall balance assessment: Needs assistance Sitting-balance support: Feet supported, Bilateral upper extremity supported, No upper extremity supported Sitting balance-Leahy Scale: Fair     Standing balance support: During functional activity, Bilateral upper extremity supported, Single extremity supported Standing balance-Leahy Scale: Poor Standing balance comment: reliant on stationary surfaces/ external support.  worked on balance on blue matting, upright posture, trunk rotation and turning 360 as balance activity.                            Cognition Arousal/Alertness: Awake/alert Behavior During Therapy: Flat affect Overall Cognitive Status: Difficult to assess  General  Comments: low focus, distractable        Exercises      General Comments        Pertinent Vitals/Pain Pain Assessment Pain Assessment: Faces Faces Pain Scale: No hurt Pain Intervention(s): Monitored during session    Home Living                          Prior Function            PT Goals (current goals can now be found in the care plan section) Acute Rehab PT Goals Patient Stated Goal: rehab and return home PT Goal Formulation: With patient Time For Goal Achievement: 08/21/22 Potential to Achieve Goals: Good Progress towards PT goals: Progressing toward goals    Frequency    Min 4X/week      PT Plan Current plan remains appropriate    Co-evaluation              AM-PAC PT "6 Clicks" Mobility   Outcome Measure  Help needed turning from your back to your side while in a flat bed without using bedrails?: A Little Help needed moving from lying on your back to sitting on the side of a flat bed without using bedrails?: A Little Help needed moving to and from a bed to a chair (including a wheelchair)?: A Little Help needed standing up from a chair using your arms (e.g., wheelchair or bedside chair)?: A Little Help needed to walk in hospital room?: A Little Help needed climbing 3-5 steps with a railing? : A Lot 6 Click Score: 17    End of Session   Activity Tolerance: Patient tolerated treatment well Patient left: with family/visitor present;in bed;with bed alarm set;with call bell/phone within reach Nurse Communication: Mobility status PT Visit Diagnosis: Unsteadiness on feet (R26.81);Other abnormalities of gait and mobility (R26.89) Hemiplegia - Right/Left: Right Hemiplegia - dominant/non-dominant: Dominant Hemiplegia - caused by: Cerebral infarction     Time: 0254-2706 PT Time Calculation (min) (ACUTE ONLY): 30 min  Charges:  $Gait Training: 8-22 mins $Therapeutic Activity: 8-22 mins                     08/12/2022  Jacinto Halim., PT Acute  Rehabilitation Services [phone]  (pager) [phone]  (office)   Eliseo Gum Prinston Kynard 08/12/2022, 11:53 AM

## 2022-08-12 NOTE — Discharge Summary (Addendum)
Stroke Discharge Summary  Patient ID: Dustin Blanchard   MRN: [id number]      DOB: February 15, 1951  Date of Admission: 08/06/2022 Date of Discharge: 08/12/2022  Attending Physician:  Stroke, Md, MD, Stroke MD Consultant(s):   cardiology Patient's PCP:  [facility]  Discharge Diagnoses: left MCA patchy infarct and a punctate right ACA infarct due to left M3 occlusion s/p TNK and IR with [id number],  embolic source likely d/t new diagnosed Afib Principal Problem:   Acute ischemic left MCA stroke ([place]) Active Problems:   Sinus pause   Acute combined systolic and diastolic heart failure (HCC)   Atrial fibrillation with RVR ([place])   Acute on chronic combined systolic and diastolic CHF (congestive heart failure) ([place])   Medications to be continued on Rehab Allergies as of 08/12/2022   No Known Allergies      Medication List     STOP taking these medications    acetaminophen 500 MG tablet Commonly known as: TYLENOL   ibuprofen 200 MG tablet Commonly known as: ADVIL       TAKE these medications    amiodarone 400 MG tablet Commonly known as: PACERONE Place 1 tablet (400 mg total) into feeding tube 2 (two) times daily.   amiodarone 200 MG tablet Commonly known as: PACERONE Place 1 tablet (200 mg total) into feeding tube daily. Start taking on: August 18, 2022   apixaban 5 MG Tabs tablet Commonly known as: ELIQUIS Place 1 tablet (5 mg total) into feeding tube 2 (two) times daily.   atorvastatin 40 MG tablet Commonly known as: LIPITOR Place 1 tablet (40 mg total) into feeding tube daily. What changed:  medication strength how much to take how to take this   feeding supplement (OSMOLITE 1.5 CAL) Liqd Place 1,000 mLs into feeding tube continuous.   feeding supplement (PROSource TF20) liquid Place 60 mLs into feeding tube 2 (two) times daily.   multivitamin with minerals Tabs tablet Place 1 tablet into feeding tube daily.   polyethylene glycol 17 g  packet Commonly known as: MIRALAX / GLYCOLAX Take 17 g by mouth daily as needed for mild constipation.   QUEtiapine 25 MG tablet Commonly known as: SEROQUEL Place 1 tablet (25 mg total) into feeding tube daily with supper.   QUEtiapine 25 MG tablet Commonly known as: SEROQUEL Place 0.5 tablets (12.5 mg total) into feeding tube 3 (three) times daily as needed (agitation).   spironolactone 25 MG tablet Commonly known as: ALDACTONE Place 1 tablet (25 mg total) into feeding tube daily.   tamsulosin 0.4 MG Caps capsule Commonly known as: FLOMAX Take 0.4 mg by mouth daily.   valproic acid 250 MG/5ML solution Commonly known as: DEPAKENE Place 5 mLs (250 mg total) into feeding tube 3 (three) times daily before meals.   Vitamin D3 125 MCG (5000 UT) Caps Take 5,000 Units by mouth daily.        LABORATORY STUDIES CBC    Component Value Date/Time   WBC 4.2 08/06/2022 0437   RBC 4.22 08/06/2022 0437   HGB 13.7 08/06/2022 0437   HCT 40.5 08/06/2022 0437   PLT 210 08/06/2022 0437   MCV 96.0 08/06/2022 0437   MCH 32.5 08/06/2022 0437   MCHC 33.8 08/06/2022 0437   RDW 12.3 08/06/2022 0437   LYMPHSABS 1.2 08/06/2022 0437   MONOABS 0.3 08/06/2022 0437   EOSABS 0.1 08/06/2022 0437   BASOSABS 0.0 08/06/2022 0437   CMP    Component Value Date/Time  NA 140 08/11/2022 0419   K 4.0 08/11/2022 0419   CL 110 08/11/2022 0419   CO2 21 (L) 08/11/2022 0419   GLUCOSE 132 (H) 08/11/2022 0419   BUN 21 08/11/2022 0419   CREATININE 0.89 08/11/2022 0419   CALCIUM 8.9 08/11/2022 0419   PROT 5.3 (L) 08/11/2022 0419   ALBUMIN 2.8 (L) 08/11/2022 0419   AST 17 08/11/2022 0419   ALT 12 08/11/2022 0419   ALKPHOS 52 08/11/2022 0419   BILITOT 0.7 08/11/2022 0419   GFRNONAA >60 08/11/2022 0419   GFRAA >60 09/07/2016 2354   COAGS Lab Results  Component Value Date   INR 1.2 08/06/2022   Lipid Panel    Component Value Date/Time   CHOL 130 08/07/2022 0241   TRIG 60 08/07/2022 0241   HDL  47 08/07/2022 0241   CHOLHDL 2.8 08/07/2022 0241   VLDL 12 08/07/2022 0241   LDLCALC 71 08/07/2022 0241   HgbA1C  Lab Results  Component Value Date   HGBA1C 4.6 (L) 08/06/2022   Urinalysis    Component Value Date/Time   COLORURINE STRAW (A) 08/06/2022 0948   APPEARANCEUR CLEAR 08/06/2022 0948   LABSPEC 1.019 08/06/2022 0948   PHURINE 8.0 08/06/2022 0948   GLUCOSEU 50 (A) 08/06/2022 0948   HGBUR SMALL (A) 08/06/2022 0948   BILIRUBINUR NEGATIVE 08/06/2022 0948   KETONESUR NEGATIVE 08/06/2022 0948   PROTEINUR NEGATIVE 08/06/2022 0948   NITRITE NEGATIVE 08/06/2022 0948   LEUKOCYTESUR NEGATIVE 08/06/2022 0948   Urine Drug Screen     Component Value Date/Time   LABOPIA NONE DETECTED 08/06/2022 0948   COCAINSCRNUR NONE DETECTED 08/06/2022 0948   LABBENZ NONE DETECTED 08/06/2022 0948   AMPHETMU NONE DETECTED 08/06/2022 0948   THCU NONE DETECTED 08/06/2022 0948   LABBARB NONE DETECTED 08/06/2022 0948    Alcohol Level    Component Value Date/Time   ETH <10 08/06/2022 0437     SIGNIFICANT DIAGNOSTIC STUDIES DG Abd Portable 1V  Result Date: 08/10/2022 CLINICAL DATA:  Is a gastric tube placement. EXAM: PORTABLE ABDOMEN - 1 VIEW COMPARISON:  08/07/2022. FINDINGS: Feeding tube terminates in the distal gastric antrum. There is retained contrast in the colon. Visualized lung bases are grossly clear. IMPRESSION: Feeding tube terminates in the distal gastric antrum. Electronically Signed   By: Lorin Picket M.D.   On: 08/10/2022 14:18   DG Swallowing Func-Speech Pathology  Result Date: 08/07/2022 Table formatting from the original result was not included. Objective Swallowing Evaluation: Type of Study: MBS-Modified Barium Swallow Study  Patient Details Name: Dustin Blanchard MRN: [id number] Date of Birth: November 05, 1951 Today's Date: 08/07/2022 Time: SLP Start Time (ACUTE ONLY): 1312 -SLP Stop Time (ACUTE ONLY): 1334 SLP Time Calculation (min) (ACUTE ONLY): 22 min Past Medical History: Past  Medical History: Diagnosis Date  Dementia [facility])  Past Surgical History: Past Surgical History: Procedure Laterality Date  HERNIA REPAIR    HIP SURGERY    IR CT HEAD LTD  08/06/2022  IR CT HEAD LTD  08/06/2022  IR PERCUTANEOUS ART THROMBECTOMY/INFUSION INTRACRANIAL INC DIAG ANGIO  08/06/2022  IR US GUIDE VASC ACCESS RIGHT  08/06/2022  RADIOLOGY WITH ANESTHESIA N/A 08/06/2022  Procedure: IR WITH ANESTHESIA;  Surgeon: Luanne Bras, MD;  Location: [place];  Service: Radiology;  Laterality: N/A; HPI: Pt is a 71 y.o. male who presented with right-sided weakness and aphasia. CT head: Density overlying the left cerebral convexity is grossly similar  to the intra procedural images obtained earlier the same day and is  favored to reflect  predominantly contrast extravasation; however,  superimposed subarachnoid hemorrhage can not be entirely excluded. Small focus of hypodensity in the left frontal lobe cortex suspicious for small evolving infarct. PMH: hypertension, hyperlipidemia, sinus bradycardia, BPH, vascular dementia (dx in Dec, 2022 per family)  No data recorded  Recommendations for follow up therapy are one component of a multi-disciplinary discharge planning process, led by the attending physician.  Recommendations may be updated based on patient status, additional functional criteria and insurance authorization. Assessment / Plan / Recommendation   08/07/2022   1:12 PM Clinical Impressions Clinical Impression Pt presents with oropharyngeal dysphagia characterized by weak lingual manipulation, a pharyngeal delay, and reduction in bolus cohesion, tongue base retraction, anterior laryngeal movement, epiglottic inversion, and pharyngeal constriction. He exhibited premature spillage to the pyriform sinuses, vallecular residue, pyriform sinus residue, and reduced laryngeal closure. He demonstrated penetration (PAS 3, 5) and/or silent aspiration (PAS 8) across consistencies. Laryngeal invasion was secondary to the pharyngeal  delay, reduced laryngeal closure, and residue in the pyriform sinuses. Episodes of aspiration intermittently triggered subtle throat clearing, but no instances resulted in coughing, and throat clearing was ineffective in expelling material. More viscous boluses (i.e., thickened liquids/solids) reduced the immediacy of aspiration, but aspiration was still noted from residue. Multiple secondary swallows reduced pharyngeal residue, but not to a functional level. It is recommended that the pt's NPO status be maintained and that short-term non-oral alimentation (e.g., cortrak) be initiated. Pt may have limited (i.e., ~4-5/hour) individual ice chips after thorough oral care and SLP will follow for dysphagia treatment. SLP Visit Diagnosis Dysphagia, oropharyngeal phase (R13.12) Impact on safety and function Moderate aspiration risk;Severe aspiration risk     08/07/2022   1:12 PM Treatment Recommendations Treatment Recommendations Therapy as outlined in treatment plan below     08/07/2022   1:12 PM Prognosis Prognosis for Safe Diet Advancement Good Barriers to Reach Goals Cognitive deficits;Language deficits;Severity of deficits   08/07/2022   1:12 PM Diet Recommendations SLP Diet Recommendations NPO;Ice chips PRN after oral care Medication Administration Via alternative means     08/07/2022   1:12 PM Other Recommendations Oral Care Recommendations Oral care QID;Staff/trained caregiver to provide oral care Follow Up Recommendations Acute inpatient rehab (3hours/day) Assistance recommended at discharge Frequent or constant Supervision/Assistance Functional Status Assessment Patient has had a recent decline in their functional status and demonstrates the ability to make significant improvements in function in a reasonable and predictable amount of time.   08/07/2022   1:12 PM Frequency and Duration  Speech Therapy Frequency (ACUTE ONLY) min 2x/week Treatment Duration 2 weeks     08/07/2022   1:12 PM Oral Phase Oral Phase Impaired  Oral - Honey Teaspoon Lingual/palatal residue;Weak lingual manipulation;Delayed oral transit;Decreased bolus cohesion;Premature spillage Oral - Honey Cup Lingual/palatal residue;Weak lingual manipulation;Delayed oral transit;Decreased bolus cohesion;Premature spillage Oral - Nectar Cup Lingual/palatal residue;Weak lingual manipulation;Delayed oral transit;Decreased bolus cohesion;Premature spillage Oral - Nectar Straw Lingual/palatal residue;Weak lingual manipulation;Delayed oral transit;Premature spillage;Decreased bolus cohesion Oral - Thin Cup Lingual/palatal residue;Weak lingual manipulation;Right anterior bolus loss;Delayed oral transit;Decreased bolus cohesion;Premature spillage Oral - Thin Straw Lingual/palatal residue;Weak lingual manipulation;Right anterior bolus loss;Premature spillage;Decreased bolus cohesion;Delayed oral transit Oral - Puree Lingual/palatal residue;Weak lingual manipulation;Decreased bolus cohesion;Premature spillage Oral - Mech Soft Lingual/palatal residue;Weak lingual manipulation;Premature spillage;Decreased bolus cohesion;Delayed oral transit;Impaired mastication    08/07/2022   1:12 PM Pharyngeal Phase Pharyngeal Phase Impaired Pharyngeal- Honey Teaspoon Reduced pharyngeal peristalsis;Reduced epiglottic inversion;Delayed swallow initiation-pyriform sinuses;Reduced anterior laryngeal mobility;Reduced airway/laryngeal closure;Reduced tongue base retraction;Penetration/Apiration after swallow;Trace aspiration;Pharyngeal residue -  valleculae;Pharyngeal residue - pyriform;Pharyngeal residue - posterior pharnyx Pharyngeal Material enters airway, passes BELOW cords without attempt by patient to eject out (silent aspiration);Material enters airway, CONTACTS cords and not ejected out Pharyngeal- Honey Cup Reduced pharyngeal peristalsis;Reduced epiglottic inversion;Delayed swallow initiation-pyriform sinuses;Reduced anterior laryngeal mobility;Reduced airway/laryngeal closure;Reduced tongue  base retraction;Penetration/Apiration after swallow;Trace aspiration;Pharyngeal residue - valleculae;Pharyngeal residue - pyriform;Pharyngeal residue - posterior pharnyx;Penetration/Aspiration during swallow Pharyngeal Material enters airway, remains ABOVE vocal cords and not ejected out;Material enters airway, CONTACTS cords and not ejected out;Material enters airway, passes BELOW cords without attempt by patient to eject out (silent aspiration) Pharyngeal- Nectar Cup Reduced pharyngeal peristalsis;Reduced epiglottic inversion;Delayed swallow initiation-pyriform sinuses;Reduced anterior laryngeal mobility;Reduced airway/laryngeal closure;Reduced tongue base retraction;Pharyngeal residue - valleculae;Pharyngeal residue - pyriform;Pharyngeal residue - posterior pharnyx;Penetration/Aspiration during swallow;Penetration/Apiration after swallow Pharyngeal Material enters airway, CONTACTS cords and not ejected out;Material enters airway, passes BELOW cords without attempt by patient to eject out (silent aspiration) Pharyngeal- Nectar Straw Reduced pharyngeal peristalsis;Reduced epiglottic inversion;Delayed swallow initiation-pyriform sinuses;Reduced anterior laryngeal mobility;Reduced airway/laryngeal closure;Reduced tongue base retraction;Pharyngeal residue - valleculae;Pharyngeal residue - pyriform;Pharyngeal residue - posterior pharnyx;Penetration/Aspiration during swallow;Penetration/Apiration after swallow Pharyngeal Material enters airway, passes BELOW cords without attempt by patient to eject out (silent aspiration) Pharyngeal- Thin Cup Reduced pharyngeal peristalsis;Reduced epiglottic inversion;Delayed swallow initiation-pyriform sinuses;Reduced anterior laryngeal mobility;Reduced airway/laryngeal closure;Reduced tongue base retraction;Pharyngeal residue - valleculae;Pharyngeal residue - pyriform;Pharyngeal residue - posterior pharnyx;Penetration/Aspiration during swallow;Penetration/Apiration after  swallow;Penetration/Aspiration before swallow Pharyngeal Material enters airway, passes BELOW cords without attempt by patient to eject out (silent aspiration);Material enters airway, CONTACTS cords and not ejected out;Material enters airway, remains ABOVE vocal cords and not ejected out Pharyngeal- Thin Straw Reduced pharyngeal peristalsis;Reduced epiglottic inversion;Delayed swallow initiation-pyriform sinuses;Reduced anterior laryngeal mobility;Reduced airway/laryngeal closure;Reduced tongue base retraction;Pharyngeal residue - valleculae;Pharyngeal residue - pyriform;Pharyngeal residue - posterior pharnyx;Penetration/Aspiration during swallow;Penetration/Apiration after swallow Pharyngeal Material enters airway, passes BELOW cords without attempt by patient to eject out (silent aspiration);Material enters airway, CONTACTS cords and not ejected out Pharyngeal- Puree Reduced pharyngeal peristalsis;Reduced epiglottic inversion;Delayed swallow initiation-pyriform sinuses;Reduced anterior laryngeal mobility;Reduced airway/laryngeal closure;Reduced tongue base retraction;Pharyngeal residue - valleculae;Pharyngeal residue - pyriform;Pharyngeal residue - posterior pharnyx;Penetration/Aspiration during swallow Pharyngeal Material enters airway, CONTACTS cords and then ejected out Pharyngeal- Mechanical Soft Reduced pharyngeal peristalsis;Reduced epiglottic inversion;Delayed swallow initiation-pyriform sinuses;Reduced anterior laryngeal mobility;Reduced airway/laryngeal closure;Reduced tongue base retraction;Pharyngeal residue - valleculae;Pharyngeal residue - pyriform;Pharyngeal residue - posterior pharnyx;Penetration/Aspiration during swallow;Penetration/Apiration after swallow Pharyngeal Material enters airway, remains ABOVE vocal cords and not ejected out    08/07/2022   1:12 PM Cervical Esophageal Phase  Cervical Esophageal Phase [facility] Shanika I. Hardin Negus, [place], [place] Office number  [phone] Horton Marshall 08/07/2022, 3:46 PM                     DG Abd Portable 1V  Result Date: 08/07/2022 CLINICAL DATA:  NG tube placement. EXAM: PORTABLE ABDOMEN - 1 VIEW COMPARISON:  None Available. FINDINGS: Enteric tube is present as it courses through the stomach with tip over the right upper quadrant likely over the distal stomach or proximal duodenum. Visualized bowel gas pattern is nonobstructive. Contrast present within the large and small bowel. There is no free peritoneal air. Lung bases are clear. Mild cardiomegaly. Mild degenerative change of the spine. IMPRESSION: Enteric tube with tip over the right upper quadrant likely over the distal stomach or proximal duodenum. Electronically Signed   By: Marin Olp M.D.   On: 08/07/2022 15:17   MR BRAIN WO CONTRAST  Result Date: 08/06/2022 CLINICAL DATA:  Stroke follow-up EXAM: MRI HEAD WITHOUT CONTRAST TECHNIQUE: Multiplanar, multiecho pulse sequences of the brain and surrounding structures were obtained without intravenous contrast. COMPARISON:  None Available. FINDINGS: Brain: Multifocal acute/subacute ischemia in the left MCA territory, predominantly involving the left frontal operculum and posterior temporal lobe. There is a punctate focus of abnormal diffusion restriction within the paramedian right frontal lobe that is likely a susceptibility artifact related to falcine calcification. There is susceptibility effects in the left MCA territory from likely extravasated contrast. There is multifocal hyperintense T2-weighted signal within the white matter. Parenchymal volume and CSF spaces are normal. Small volume hemorrhage at the left external capsule (series 6, image 19), which appears to be old. Vascular: Major flow voids are preserved. Skull and upper cervical spine: Normal calvarium and skull base. Visualized upper cervical spine and soft tissues are normal. Sinuses/Orbits:No paranasal sinus fluid levels or advanced mucosal  thickening. No mastoid or middle ear effusion. Normal orbits. IMPRESSION: 1. Multifocal acute/subacute ischemia in the left MCA territory, predominantly involving the left frontal operculum and posterior temporal lobe. 2. Small volume hemorrhage at the left external capsule, favored to be old. Electronically Signed   By: Ulyses Jarred M.D.   On: 08/06/2022 18:54   ECHOCARDIOGRAM COMPLETE  Result Date: 08/06/2022    ECHOCARDIOGRAM REPORT   Patient Name:   Dustin Blanchard Date of Exam: 08/06/2022 Medical Rec #:  [id number]      Height:       72.0 in Accession #:    [id number]     Weight:       158.7 lb Date of Birth:  Nov 15, 1951       BSA:          1.931 m Patient Age:    71 years       BP:           136/90 mmHg Patient Gender: M              HR:           81 bpm. Exam Location:  Inpatient Procedure: 2D Echo, Cardiac Doppler and Color Doppler Indications:    Stroke  History:        Patient has no prior history of Echocardiogram examinations.                 Acute ischemic left MCA stroke ([place]), Dementia.  Sonographer:    Alvino Chapel RCS Referring Phys: [id number] [place]  1. Coarse trabeculation of the apex without obvious thrombus. Left ventricular ejection fraction, by estimation, is 35 to 40%. Left ventricular ejection fraction by 2D MOD biplane is 37.1 %. The left ventricle has moderately decreased function. The left  ventricle demonstrates global hypokinesis. There is mild left ventricular hypertrophy. Left ventricular diastolic parameters are consistent with Grade III diastolic dysfunction (restrictive). Elevated left ventricular end-diastolic pressure.  2. Right ventricular systolic function is low normal. The right ventricular size is normal. There is mildly elevated pulmonary artery systolic pressure. The estimated right ventricular systolic pressure is 83.2 mmHg.  3. Left atrial size was severely dilated.  4. Moderate pericardial effusion. The pericardial effusion is circumferential.  There is no evidence of cardiac tamponade.  5. The mitral valve is abnormal. Moderate mitral valve regurgitation.  6. The aortic valve is tricuspid. Aortic valve regurgitation is trivial.  7. The inferior vena cava is normal in size with greater than 50% respiratory variability, suggesting right atrial pressure of 3 mmHg. Comparison(s): No prior Echocardiogram. FINDINGS  Left Ventricle: Coarse trabeculation of the apex without obvious thrombus. Left ventricular ejection fraction, by estimation, is 35 to 40%. Left ventricular ejection fraction by 2D MOD biplane is 37.1 %. The left ventricle has moderately decreased function. The left ventricle demonstrates global hypokinesis. The left ventricular internal cavity size was normal in size. There is mild left ventricular hypertrophy. Left ventricular diastolic parameters are consistent with Grade III diastolic dysfunction (restrictive). Elevated left ventricular end-diastolic pressure. Right Ventricle: The right ventricular size is normal. No increase in right ventricular wall thickness. Right ventricular systolic function is low normal. There is mildly elevated pulmonary artery systolic pressure. The tricuspid regurgitant velocity is 3.21 m/s, and with an assumed right atrial pressure of 3 mmHg, the estimated right ventricular systolic pressure is 90.3 mmHg. Left Atrium: Left atrial size was severely dilated. Right Atrium: Right atrial size was normal in size. Pericardium: A moderately sized pericardial effusion is present. The pericardial effusion is circumferential. There is diastolic collapse of the right atrial wall and diastolic collapse of the right ventricular free wall. There is no evidence of cardiac tamponade. Mitral Valve: The mitral valve is abnormal. There is mild thickening of the anterior and posterior mitral valve leaflet(s). Moderate mitral valve regurgitation. Tricuspid Valve: The tricuspid valve is grossly normal. Tricuspid valve regurgitation is  mild. Aortic Valve: The aortic valve is tricuspid. Aortic valve regurgitation is trivial. Pulmonic Valve: The pulmonic valve was grossly normal. Pulmonic valve regurgitation is trivial. Aorta: The aortic root and ascending aorta are structurally normal, with no evidence of dilitation. Venous: The inferior vena cava is normal in size with greater than 50% respiratory variability, suggesting right atrial pressure of 3 mmHg. IAS/Shunts: No atrial level shunt detected by color flow Doppler.  LEFT VENTRICLE PLAX 2D                        Biplane EF (MOD) LVIDd:         5.40 cm         LV Biplane EF:   Left LVIDs:         3.90 cm                          ventricular LV PW:         1.30 cm                          ejection LV IVS:        1.10 cm                          fraction by LVOT diam:     2.00 cm                          2D MOD LV SV:         47                               biplane is LV SV Index:   24                               37.1 %. LVOT Area:     3.14 cm  Diastology                                LV e' medial:    5.98 cm/s LV Volumes (MOD)               LV E/e' medial:  15.6 LV vol d, MOD    125.5 ml      LV e' lateral:   8.49 cm/s A2C:                           LV E/e' lateral: 11.0 LV vol d, MOD    140.8 ml A4C: LV vol s, MOD    76.4 ml A2C: LV vol s, MOD    88.4 ml A4C: LV SV MOD A2C:   49.1 ml LV SV MOD A4C:   140.8 ml LV SV MOD BP:    50.3 ml RIGHT VENTRICLE RV S prime:     10.30 cm/s TAPSE (M-mode): 1.6 cm LEFT ATRIUM              Index        RIGHT ATRIUM           Index LA diam:        4.30 cm  2.23 cm/m   RA Area:     19.80 cm LA Vol (A2C):   94.0 ml  48.67 ml/m  RA Volume:   51.10 ml  26.46 ml/m LA Vol (A4C):   102.0 ml 52.82 ml/m LA Biplane Vol: 107.0 ml 55.40 ml/m  AORTIC VALVE LVOT Vmax:   75.60 cm/s LVOT Vmean:  48.600 cm/s LVOT VTI:    0.150 m  AORTA Ao Root diam: 4.10 cm MITRAL VALVE               TRICUSPID VALVE MV Area (PHT): 5.66 cm    TR Peak grad:    41.2 mmHg MV Decel Time: 134 msec    TR Vmax:        321.00 cm/s MV E velocity: 93.30 cm/s MV A velocity: 30.20 cm/s  SHUNTS MV E/A ratio:  3.09        Systemic VTI:  0.15 m                            Systemic Diam: 2.00 cm Lyman Bishop MD Electronically signed by Lyman Bishop MD Signature Date/Time: 08/06/2022/4:42:49 PM    Final    CT HEAD WO CONTRAST (5MM)  Result Date: 08/06/2022 CLINICAL DATA:  Stroke follow-up. Contrast extravasation during procedure. EXAM: CT HEAD WITHOUT CONTRAST TECHNIQUE: Contiguous axial images were obtained from the base of the skull through the vertex without intravenous contrast. RADIATION DOSE REDUCTION: This exam was performed according to the departmental dose-optimization program which includes automated exposure control, adjustment of the mA and/or kV according to patient size and/or use of iterative reconstruction technique. COMPARISON:  CT head dated 1 day prior and intra procedural images obtained earlier the same day FINDINGS: Brain: There is hyperdensity overlying the left cerebral convexity likely reflecting predominantly extravasated contrast from the recent procedure, though superimposed subarachnoid hemorrhage is difficult to entirely exclude. 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 There is no other evidence  of evolving territorial infarct. 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 The ventricles are stable in size. Additional patchy hypodensity in the supratentorial white matter likely reflecting sequela of background chronic white matter microangiopathy is unchanged There is no solid mass lesion. There is no mass effect or midline shift. Vascular: There is calcification of the bilateral cavernous ICAs. Skull: Normal. Negative for fracture or  focal lesion. Sinuses/Orbits: The imaged paranasal sinuses are clear. Globes and orbits are unremarkable. Other: None. IMPRESSION: 1. Density overlying the left cerebral convexity is grossly similar to the intra procedural images obtained earlier the same day and is favored to reflect predominantly contrast extravasation; however, superimposed subarachnoid hemorrhage can not be entirely excluded. 2. Small focus of hypodensity in the left frontal lobe cortex suspicious for small evolving infarct. Electronically Signed   By: Valetta Mole M.D.   On: 08/06/2022 12:34   IR PERCUTANEOUS ART THROMBECTOMY/INFUSION INTRACRANIAL INC DIAG ANGIO  Result Date: 08/06/2022 INDICATION: 71 year old male with past medical history significant for hypertension, hyperlipidemia, sinus bradycardia and BPH; baseline modified Rankin scale 3. He presented to outside hospital with right-sided weakness, aphasia and gaze deviation; NIHSS 18. CT angiogram of the head and neck head equivocal findings in fall EXAM: ULTRASOUND-GUIDED VASCULAR ACCESS DIAGNOSTIC CEREBRAL ANGIOGRAM MECHANICAL THROMBECTOMY FLAT PANEL HEAD CT COMPARISON:  None Available. MEDICATIONS: Ancef 2 g IV. The antibiotic was administered within 1 hour of the procedure ANESTHESIA/SEDATION: The procedure was performed under general anesthesia. CONTRAST:  100 mL of Omnipaque 300 milligram/mL FLUOROSCOPY: Radiation Exposure Index (as provided by the fluoroscopic device): 3,154 mGy Kerma COMPLICATIONS: SIR LEVEL B - Normal therapy, includes overnight admission for observation. TECHNIQUE: Informed written consent was obtained from the patient's wife after a thorough discussion of the procedural risks, benefits and alternatives. All questions were addressed. Maximal Sterile Barrier Technique was utilized including caps, mask, sterile gowns, sterile gloves, sterile drape, hand hygiene and skin antiseptic. A timeout was performed prior to the initiation of the procedure. The right  groin was prepped and draped in the usual sterile fashion. Using a micropuncture kit and the modified Seldinger technique, access was gained to the right common femoral artery and a 5 French sheath was placed. Real-time ultrasound guidance was utilized for vascular access including the acquisition of a permanent ultrasound image documenting patency of the accessed vessel. Under fluoroscopy, a 5 [place] Berenstein 2 catheter was navigated over a 0.035" Terumo Glidewire into the aortic arch. The catheter was placed into the left common carotid artery. Frontal roadmap was obtained and the catheter was then advanced over the wire into the left internal carotid artery. Frontal and lateral angiograms of the head were obtained. FINDINGS: 1. Mild atherosclerotic changes in the left common femoral artery without significant stenosis, adequate for vascular access. 2. 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 3. Hyperemia along the a patent left M3/MCA branch to the anterior parietal region with corresponding early venous opacification, suggesting ongoing ischemia. PROCEDURE: The Berenstein 2 catheter was removed over an exchange length Rosen wire and under biplane roadmap guidance. The 5 French sheath was then removed over the wire. An 8 French 25 cm femoral sheath was advanced over the wire. Then, a zoom 88 guide catheter was advanced over the wire into the upper cervical segment of the left ICA. Frontal and lateral angiograms of the head were obtained. Using biplane roadmap guidance, a zoom 55 aspiration catheter was navigated over a phenom 21 microcatheter and a Aristotle  14 microguidewire into the cavernous segment of the left ICA. The microcatheter was then navigated over the wire into a left M3/MCA middle division branch. Attempt to advance the microcatheter over the wire proved unsuccessful due to significant resistance. The microcatheter was retracted frontal and lateral angiogram of the head were  obtained. Small contrast extravasation was noted. The left M3/MCA branch appear recanalized with a persistent nonocclusive filling defect. Flat panel CT of the head was obtained and post processed in a separate workstation with concurrent attending physician supervision. Selected images were sent to PACS. Small contrast extravasation seen along the left sylvian fissure. Follow-up left internal carotid artery angiogram showed no evidence of further contrast extravasation. Delay left internal carotid artery angiogram showed stable recanalization with no evidence of further contrast extravasation. Repeat flat panel CT of the head was obtained and post processed in a separate workstation with concurrent attending physician supervision. Selected images were sent to PACS. Similar appearance of contrast extravasation in the left sylvian fissure. Repeat delay left internal carotid artery angiogram showed stable recanalization with no evidence of further contrast extravasation. Right common femoral artery angiogram was obtained in right anterior oblique view. The puncture is at the level of the common femoral artery. The artery has normal caliber, adequate for closure device. The sheath was exchanged over the wire for a Perclose prostyle which was utilized for access closure. Immediate hemostasis was achieved. IMPRESSION: 1. 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 2. Capillary hyperemia along patent anterior left parietal M3 branch with early venous drainage suggestive of ongoing ischemia. 3. Access to occluded branch with microwire resulted in recanalization (TICI 2C) with small contrast extravasation which resolved spontaneously. Delay flat panel CT and cerebral angiogram showed no further contrast extravasation. PLAN: Repeat head CT within 3 hours to evaluate for contrast extravasation stability. Electronically Signed   By: Pedro Earls M.D.   On: 08/06/2022 11:30   IR US Guide  Vasc Access Right  Result Date: 08/06/2022 INDICATION: 71 year old male with past medical history significant for hypertension, hyperlipidemia, sinus bradycardia and BPH; baseline modified Rankin scale 3. He presented to outside hospital with right-sided weakness, aphasia and gaze deviation; NIHSS 18. CT angiogram of the head and neck head equivocal findings in fall EXAM: ULTRASOUND-GUIDED VASCULAR ACCESS DIAGNOSTIC CEREBRAL ANGIOGRAM MECHANICAL THROMBECTOMY FLAT PANEL HEAD CT COMPARISON:  None Available. MEDICATIONS: Ancef 2 g IV. The antibiotic was administered within 1 hour of the procedure ANESTHESIA/SEDATION: The procedure was performed under general anesthesia. CONTRAST:  100 mL of Omnipaque 300 milligram/mL FLUOROSCOPY: Radiation Exposure Index (as provided by the fluoroscopic device): 1,610 mGy Kerma COMPLICATIONS: SIR LEVEL B - Normal therapy, includes overnight admission for observation. TECHNIQUE: Informed written consent was obtained from the patient's wife after a thorough discussion of the procedural risks, benefits and alternatives. All questions were addressed. Maximal Sterile Barrier Technique was utilized including caps, mask, sterile gowns, sterile gloves, sterile drape, hand hygiene and skin antiseptic. A timeout was performed prior to the initiation of the procedure. The right groin was prepped and draped in the usual sterile fashion. Using a micropuncture kit and the modified Seldinger technique, access was gained to the right common femoral artery and a 5 French sheath was placed. Real-time ultrasound guidance was utilized for vascular access including the acquisition of a permanent ultrasound image documenting patency of the accessed vessel. Under fluoroscopy, a 5 [place] Berenstein 2 catheter was navigated over a 0.035" Terumo Glidewire into the aortic arch. The catheter was placed  into the left common carotid artery. Frontal roadmap was obtained and the catheter was then advanced over the  wire into the left internal carotid artery. Frontal and lateral angiograms of the head were obtained. FINDINGS: 1. Mild atherosclerotic changes in the left common femoral artery without significant stenosis, adequate for vascular access. 2. 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 3. Hyperemia along the a patent left M3/MCA branch to the anterior parietal region with corresponding early venous opacification, suggesting ongoing ischemia. PROCEDURE: The Berenstein 2 catheter was removed over an exchange length Rosen wire and under biplane roadmap guidance. The 5 French sheath was then removed over the wire. An 8 French 25 cm femoral sheath was advanced over the wire. Then, a zoom 88 guide catheter was advanced over the wire into the upper cervical segment of the left ICA. Frontal and lateral angiograms of the head were obtained. Using biplane roadmap guidance, a zoom 55 aspiration catheter was navigated over a phenom 21 microcatheter and a Aristotle 14 microguidewire into the cavernous segment of the left ICA. The microcatheter was then navigated over the wire into a left M3/MCA middle division branch. Attempt to advance the microcatheter over the wire proved unsuccessful due to significant resistance. The microcatheter was retracted frontal and lateral angiogram of the head were obtained. Small contrast extravasation was noted. The left M3/MCA branch appear recanalized with a persistent nonocclusive filling defect. Flat panel CT of the head was obtained and post processed in a separate workstation with concurrent attending physician supervision. Selected images were sent to PACS. Small contrast extravasation seen along the left sylvian fissure. Follow-up left internal carotid artery angiogram showed no evidence of further contrast extravasation. Delay left internal carotid artery angiogram showed stable recanalization with no evidence of further contrast extravasation. Repeat flat panel CT of the head  was obtained and post processed in a separate workstation with concurrent attending physician supervision. Selected images were sent to PACS. Similar appearance of contrast extravasation in the left sylvian fissure. Repeat delay left internal carotid artery angiogram showed stable recanalization with no evidence of further contrast extravasation. Right common femoral artery angiogram was obtained in right anterior oblique view. The puncture is at the level of the common femoral artery. The artery has normal caliber, adequate for closure device. The sheath was exchanged over the wire for a Perclose prostyle which was utilized for access closure. Immediate hemostasis was achieved. IMPRESSION: 1. 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 2. Capillary hyperemia along patent anterior left parietal M3 branch with early venous drainage suggestive of ongoing ischemia. 3. Access to occluded branch with microwire resulted in recanalization (TICI 2C) with small contrast extravasation which resolved spontaneously. Delay flat panel CT and cerebral angiogram showed no further contrast extravasation. PLAN: Repeat head CT within 3 hours to evaluate for contrast extravasation stability. Electronically Signed   By: Pedro Earls M.D.   On: 08/06/2022 11:30   IR CT Head Ltd  Result Date: 08/06/2022 INDICATION: 71 year old male with past medical history significant for hypertension, hyperlipidemia, sinus bradycardia and BPH; baseline modified Rankin scale 3. He presented to outside hospital with right-sided weakness, aphasia and gaze deviation; NIHSS 18. CT angiogram of the head and neck head equivocal findings in fall EXAM: ULTRASOUND-GUIDED VASCULAR ACCESS DIAGNOSTIC CEREBRAL ANGIOGRAM MECHANICAL THROMBECTOMY FLAT PANEL HEAD CT COMPARISON:  None Available. MEDICATIONS: Ancef 2 g IV. The antibiotic was administered within 1 hour of the procedure ANESTHESIA/SEDATION: The procedure was  performed under general anesthesia.  CONTRAST:  100 mL of Omnipaque 300 milligram/mL FLUOROSCOPY: Radiation Exposure Index (as provided by the fluoroscopic device): 6,203 mGy Kerma COMPLICATIONS: SIR LEVEL B - Normal therapy, includes overnight admission for observation. TECHNIQUE: Informed written consent was obtained from the patient's wife after a thorough discussion of the procedural risks, benefits and alternatives. All questions were addressed. Maximal Sterile Barrier Technique was utilized including caps, mask, sterile gowns, sterile gloves, sterile drape, hand hygiene and skin antiseptic. A timeout was performed prior to the initiation of the procedure. The right groin was prepped and draped in the usual sterile fashion. Using a micropuncture kit and the modified Seldinger technique, access was gained to the right common femoral artery and a 5 French sheath was placed. Real-time ultrasound guidance was utilized for vascular access including the acquisition of a permanent ultrasound image documenting patency of the accessed vessel. Under fluoroscopy, a 5 [place] Berenstein 2 catheter was navigated over a 0.035" Terumo Glidewire into the aortic arch. The catheter was placed into the left common carotid artery. Frontal roadmap was obtained and the catheter was then advanced over the wire into the left internal carotid artery. Frontal and lateral angiograms of the head were obtained. FINDINGS: 1. Mild atherosclerotic changes in the left common femoral artery without significant stenosis, adequate for vascular access. 2. 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 3. Hyperemia along the a patent left M3/MCA branch to the anterior parietal region with corresponding early venous opacification, suggesting ongoing ischemia. PROCEDURE: The Berenstein 2 catheter was removed over an exchange length Rosen wire and under biplane roadmap guidance. The 5 French sheath was then removed over the wire. An 8 French 25  cm femoral sheath was advanced over the wire. Then, a zoom 88 guide catheter was advanced over the wire into the upper cervical segment of the left ICA. Frontal and lateral angiograms of the head were obtained. Using biplane roadmap guidance, a zoom 55 aspiration catheter was navigated over a phenom 21 microcatheter and a Aristotle 14 microguidewire into the cavernous segment of the left ICA. The microcatheter was then navigated over the wire into a left M3/MCA middle division branch. Attempt to advance the microcatheter over the wire proved unsuccessful due to significant resistance. The microcatheter was retracted frontal and lateral angiogram of the head were obtained. Small contrast extravasation was noted. The left M3/MCA branch appear recanalized with a persistent nonocclusive filling defect. Flat panel CT of the head was obtained and post processed in a separate workstation with concurrent attending physician supervision. Selected images were sent to PACS. Small contrast extravasation seen along the left sylvian fissure. Follow-up left internal carotid artery angiogram showed no evidence of further contrast extravasation. Delay left internal carotid artery angiogram showed stable recanalization with no evidence of further contrast extravasation. Repeat flat panel CT of the head was obtained and post processed in a separate workstation with concurrent attending physician supervision. Selected images were sent to PACS. Similar appearance of contrast extravasation in the left sylvian fissure. Repeat delay left internal carotid artery angiogram showed stable recanalization with no evidence of further contrast extravasation. Right common femoral artery angiogram was obtained in right anterior oblique view. The puncture is at the level of the common femoral artery. The artery has normal caliber, adequate for closure device. The sheath was exchanged over the wire for a Perclose prostyle which was utilized for access  closure. Immediate hemostasis was achieved. IMPRESSION: 1. 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 2. Capillary hyperemia along  patent anterior left parietal M3 branch with early venous drainage suggestive of ongoing ischemia. 3. Access to occluded branch with microwire resulted in recanalization (TICI 2C) with small contrast extravasation which resolved spontaneously. Delay flat panel CT and cerebral angiogram showed no further contrast extravasation. PLAN: Repeat head CT within 3 hours to evaluate for contrast extravasation stability. Electronically Signed   By: Pedro Earls M.D.   On: 08/06/2022 11:30   IR CT Head Ltd  Result Date: 08/06/2022 INDICATION: 71 year old male with past medical history significant for hypertension, hyperlipidemia, sinus bradycardia and BPH; baseline modified Rankin scale 3. He presented to outside hospital with right-sided weakness, aphasia and gaze deviation; NIHSS 18. CT angiogram of the head and neck head equivocal findings in fall EXAM: ULTRASOUND-GUIDED VASCULAR ACCESS DIAGNOSTIC CEREBRAL ANGIOGRAM MECHANICAL THROMBECTOMY FLAT PANEL HEAD CT COMPARISON:  None Available. MEDICATIONS: Ancef 2 g IV. The antibiotic was administered within 1 hour of the procedure ANESTHESIA/SEDATION: The procedure was performed under general anesthesia. CONTRAST:  100 mL of Omnipaque 300 milligram/mL FLUOROSCOPY: Radiation Exposure Index (as provided by the fluoroscopic device): 1,194 mGy Kerma COMPLICATIONS: SIR LEVEL B - Normal therapy, includes overnight admission for observation. TECHNIQUE: Informed written consent was obtained from the patient's wife after a thorough discussion of the procedural risks, benefits and alternatives. All questions were addressed. Maximal Sterile Barrier Technique was utilized including caps, mask, sterile gowns, sterile gloves, sterile drape, hand hygiene and skin antiseptic. A timeout was performed prior to the initiation  of the procedure. The right groin was prepped and draped in the usual sterile fashion. Using a micropuncture kit and the modified Seldinger technique, access was gained to the right common femoral artery and a 5 French sheath was placed. Real-time ultrasound guidance was utilized for vascular access including the acquisition of a permanent ultrasound image documenting patency of the accessed vessel. Under fluoroscopy, a 5 [place] Berenstein 2 catheter was navigated over a 0.035" Terumo Glidewire into the aortic arch. The catheter was placed into the left common carotid artery. Frontal roadmap was obtained and the catheter was then advanced over the wire into the left internal carotid artery. Frontal and lateral angiograms of the head were obtained. FINDINGS: 1. Mild atherosclerotic changes in the left common femoral artery without significant stenosis, adequate for vascular access. 2. 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 3. Hyperemia along the a patent left M3/MCA branch to the anterior parietal region with corresponding early venous opacification, suggesting ongoing ischemia. PROCEDURE: The Berenstein 2 catheter was removed over an exchange length Rosen wire and under biplane roadmap guidance. The 5 French sheath was then removed over the wire. An 8 French 25 cm femoral sheath was advanced over the wire. Then, a zoom 88 guide catheter was advanced over the wire into the upper cervical segment of the left ICA. Frontal and lateral angiograms of the head were obtained. Using biplane roadmap guidance, a zoom 55 aspiration catheter was navigated over a phenom 21 microcatheter and a Aristotle 14 microguidewire into the cavernous segment of the left ICA. The microcatheter was then navigated over the wire into a left M3/MCA middle division branch. Attempt to advance the microcatheter over the wire proved unsuccessful due to significant resistance. The microcatheter was retracted frontal and lateral  angiogram of the head were obtained. Small contrast extravasation was noted. The left M3/MCA branch appear recanalized with a persistent nonocclusive filling defect. Flat panel CT of the head was obtained and post processed in a separate workstation with  concurrent attending physician supervision. Selected images were sent to PACS. Small contrast extravasation seen along the left sylvian fissure. Follow-up left internal carotid artery angiogram showed no evidence of further contrast extravasation. Delay left internal carotid artery angiogram showed stable recanalization with no evidence of further contrast extravasation. Repeat flat panel CT of the head was obtained and post processed in a separate workstation with concurrent attending physician supervision. Selected images were sent to PACS. Similar appearance of contrast extravasation in the left sylvian fissure. Repeat delay left internal carotid artery angiogram showed stable recanalization with no evidence of further contrast extravasation. Right common femoral artery angiogram was obtained in right anterior oblique view. The puncture is at the level of the common femoral artery. The artery has normal caliber, adequate for closure device. The sheath was exchanged over the wire for a Perclose prostyle which was utilized for access closure. Immediate hemostasis was achieved. IMPRESSION: 1. 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 2. Capillary hyperemia along patent anterior left parietal M3 branch with early venous drainage suggestive of ongoing ischemia. 3. Access to occluded branch with microwire resulted in recanalization (TICI 2C) with small contrast extravasation which resolved spontaneously. Delay flat panel CT and cerebral angiogram showed no further contrast extravasation. PLAN: Repeat head CT within 3 hours to evaluate for contrast extravasation stability. Electronically Signed   By: Pedro Earls M.D.   On:  08/06/2022 11:30   CT ANGIO HEAD NECK W WO CM W PERF (CODE STROKE)  Result Date: 08/06/2022 CLINICAL DATA:  71 year old male code stroke presentation, left MCA symptomatology. EXAM: CT ANGIOGRAPHY HEAD AND NECK CT PERFUSION BRAIN TECHNIQUE: Multidetector CT imaging of the head and neck was performed using the standard protocol during bolus administration of intravenous contrast. Multiplanar CT image reconstructions and MIPs were obtained to evaluate the vascular anatomy. Carotid stenosis measurements (when applicable) are obtained utilizing NASCET criteria, using the distal internal carotid diameter as the denominator. Multiphase CT imaging of the brain was performed following IV bolus contrast injection. Subsequent parametric perfusion maps were calculated using RAPID software. RADIATION DOSE REDUCTION: This exam was performed according to the departmental dose-optimization program which includes automated exposure control, adjustment of the mA and/or kV according to patient size and/or use of iterative reconstruction technique. CONTRAST:  160m OMNIPAQUE IOHEXOL 350 MG/ML SOLN COMPARISON:  Plain head CT 0449 hours today. CT cervical spine reported separately. FINDINGS: CT Brain Perfusion Findings: ASPECTS: Ten CBF (<30%) Volume: 084m CBV parameter changes of 13-16 mL at the left operculum. The Perfusion (Tmax>6.0s) volume: 12337m Hypoperfusion index 0.2. Mismatch Volume: 123m40mfarction Location:Left MCA territory CTA NECK Skeleton: Cervical spine CT detailed separately. No acute osseous abnormality identified. Upper chest: Mild dependent atelectasis. Mild retained secretions in the upper thoracic esophagus. Other neck: Left thyroid nodules smaller than 1.5 cm Not clinically significant; no follow-up imaging recommended (ref: J Am Coll Radiol. 2015 Feb;12(2): 143-50).Otherwise negative. Aortic arch: Calcified aortic atherosclerosis. 3 vessel arch configuration. Right carotid system: Minimal plaque. Mild  motion artifact distal to the right ICA bulb. No stenosis. Left carotid system: Mild tortuosity and minimal plaque. No stenosis. Vertebral arteries: Minimal plaque except at the left vertebral artery origin and V1 segment (series 7, image 290) where there is mild stenosis. No other stenosis along the course of both vertebral arteries to the skull base. CTA HEAD Posterior circulation: Moderate left vertebral artery soft plaque and stenosis series 7, image 155. This is proximal to the left PICA origin which remains patent.  Patent vertebrobasilar junction. No distal right vertebral stenosis. Patent basilar artery without stenosis. Patent SCA and PCA origins. Posterior communicating arteries are diminutive or absent. Bilateral PCA branches appear fairly symmetric and within normal limits. No proximal left PCA occlusion. Anterior circulation: Bilateral ICA siphon atherosclerosis, with up to moderate plaque. Mild to moderate supraclinoid left ICA stenosis series 9, image 101. Contralateral right ICA siphon stenosis. Patent carotid termini. Patent MCA and ACA origins. Anterior communicating artery and bilateral ACA branches are within normal limits. Median artery of the corpus callosum (normal variant). Right MCA M1 segment and trifurcation are patent without stenosis. Right MCA branches are within normal limits. Left MCA M1 segment and left MCA trifurcation are patent. No left MCA M2 branch occlusion is identified. However, there is evidence of middle division M3 branch occlusion on series 12, image 26, corresponding to the level of the operculum. Venous sinuses: Grossly patent. Anatomic variants: Median artery of the corpus callosum. Review of the MIP images confirms the above findings Preliminary report of the above discussed by telephone with Dr. Joseph Berkshire on 08/06/2022 at 05:22 . IMPRESSION: 1. CT Perfusion is positive for left MCA territory oligemia, proximally 120 mL. No infarct core detected by strict  criteria, but abnormal CBV at the left operculum. 2. No large vessel occlusion identified on CTA, but left M3 operculum branch occlusion(s) suspected. Still, given the CTP findings and presentation recommend discussion with Neurology for consideration of NIR intervention. 3. The above was discussed by telephone with Dr. Joseph Berkshire in the ED on 08/06/2022 at 0522 hours, and also Dr. Curly Shores at [phone] hours. 4. 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 Bilateral ICA siphon plaque with up to moderate Left supraclinoid stenosis. Moderate Left Vertebral V4 stenosis. No proximal Left PCA branch occlusion (small age indeterminate left occipital pole infarct on plain CT). Electronically Signed   By: Genevie Ann M.D.   On: 08/06/2022 05:34   CT Cervical Spine Wo Contrast  Result Date: 08/06/2022 CLINICAL DATA:  71 year old male code stroke patient. Right side weakness. EXAM: CT CERVICAL SPINE WITHOUT CONTRAST TECHNIQUE: Multidetector CT imaging of the cervical spine was performed without intravenous contrast. Multiplanar CT image reconstructions were also generated. RADIATION DOSE REDUCTION: This exam was performed according to the departmental dose-optimization program which includes automated exposure control, adjustment of the mA and/or kV according to patient size and/or use of iterative reconstruction technique. COMPARISON:  Plain head CT the same time reported separately. FINDINGS: Alignment: Straightening of cervical lordosis. Cervicothoracic junction alignment is within normal limits. Bilateral posterior element alignment is within normal limits. Skull base and vertebrae: Visualized skull base is intact. No atlanto-occipital dissociation. C1 and C2 appear intact and aligned. No acute osseous abnormality identified. Soft tissues and spinal canal: No prevertebral fluid or swelling. No visible canal hematoma. Elongated stylohyoid ligament calcification greater on the left. Disc levels:  Widespread advanced cervical spine degeneration. Multilevel mild and up to moderate cervical spinal stenosis (series 9, image 31). Upper chest: Mild apical lung scarring. Chronic appearing mild T1 superior endplate deformity, Schmorl's node. Other: 14 mm left thyroid nodule, Not clinically significant; no follow-up imaging recommended (ref: J Am Coll Radiol. 2015 Feb;12(2): 143-50). IMPRESSION: 1. No acute traumatic injury identified in the cervical spine. 2. 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 Electronically Signed   By: Genevie Ann M.D.   On: 08/06/2022 05:19   CT HEAD CODE STROKE WO CONTRAST  Result Date: 08/06/2022 CLINICAL DATA:  Code stroke.  71 year old male. Fall at 0300 hours. Right side weakness and abnormal speech. EXAM: CT HEAD WITHOUT CONTRAST TECHNIQUE: Contiguous axial images were obtained from the base of the skull through the vertex without intravenous contrast. RADIATION DOSE REDUCTION: This exam was performed according to the departmental dose-optimization program which includes automated exposure control, adjustment of the mA and/or kV according to patient size and/or use of iterative reconstruction technique. COMPARISON:  None Available. FINDINGS: Brain: Cerebral volume is within normal limits for age. No midline shift, mass effect, or evidence of intracranial mass lesion. No ventriculomegaly. No acute intracranial hemorrhage identified. Patchy and confluent bilateral cerebral white matter hypodensity, fairly symmetric. No cortically based acute infarct identified in the left MCA territory. But there is confluent hypodensity in the left occipital pole in a small area on series 5, image 16 (about 15-20 mm). Vascular: Calcified atherosclerosis at the skull base. No suspicious intracranial vascular hyperdensity. Skull: No acute osseous abnormality identified. Sinuses/Orbits: Visualized paranasal sinuses and mastoids are clear.  Other: Leftward gaze. Scalp soft tissues appear within normal limits. ASPECTS [facility] Stroke Program Early CT Score) Total score (0-10 with 10 being normal): 10 (cytotoxic edema only in the left PCA territory). IMPRESSION: 1. Leftward gaze deviation, raising the possibility of cortical ischemia in this setting. No Left MCA territory cortical infarct changes are identified. But there is a small age indeterminate Left PCA infarct at the left occipital pole. 2. No acute intracranial hemorrhage identified. ASPECTS 10. 3. Advanced bilateral white matter changes most commonly due to small vessel disease. Study discussed by telephone with Dr. Joseph Berkshire on 08/06/2022 at 05:11 . Electronically Signed   By: Genevie Ann M.D.   On: 08/06/2022 05:11       HISTORY OF PRESENT ILLNESS Kire Ferg is a 71 y.o. male with a past medical history significant for hypertension, hyperlipidemia, sinus bradycardia, BPH   He was his normal state of health when he went to bed at 2330.  At approximately 2:58 AM his wife heard him grunting and found him on the floor in his urine.   At baseline he is continent, ambulates without assistance and still drives, although he has been encouraged to always drive with his wife in the car as he does sometimes get lost.  He is independent in all of his ADLs but does require assistance with his IADLs   Per family no other recent symptoms or concerns   CT perfusion imaging was completed and on reviewing this push notification to my phone I logged onto the telespecialist call to assist with transfer in case it was indicated based on their evaluation; of note he did require 1 mg of Ativan to facilitate imaging   Please note, per telestroke nurse note: "Code stroke activated at 0433; Pt to CT at 0443 and back at 0510. TS paged at [place] and Dr Gaynell Face on screen at [phone]. Dr Curly Shores [facility] Neurologist) also joined stroke Cart. 0530 Dr Betsey Holiday at bedside to give Dr Gaynell Face CTA/perfusion read  and advised that he had already contacted [facility] to speak with neurology at [facility] regarding transfer. After [place] disconnected, Dr Curly Shores did speak with family regarding transfer to [facility] for possible Thrombectomy."   LKW: 2330 tPA given?: No, due to out of the window IA performed?:  Yes Premorbid modified rankin scale:      3 - Moderate disability. Requires some help, but able to walk unassisted.  HOSPITAL COURSE Patient presented to outside hospital after being found down at home.  He  was found to have an acute left MCA stroke.  He was given TNK and transferred here for mechanical thrombectomy, which was successful.  Punctate right ACA infarct was also seen on MRI. He was found to have new onset atrial fibrillation and was stared on Eliquis.  He is now stable and ready for transfer to CIR.  Stroke: left MCA patchy infarct and a punctate right ACA infarct due to left M3 occlusion s/p TNK and IR with [id number],  embolic source likely d/t new diagnosed Afib CT showed no acute abnormality, old left occipital pole infarct.  Status post TNK.   CTA head and neck showed left M3 occlusion, bilateral siphon atherosclerosis with left moderate stenosis, left V4 stenosis.   CTP 0/123.   Status post IR with left M3 TICI2c reperfusion.   Repeat CT showed left frontal infarct without significant hemorrhage.   MRI showed left MCA patchy infarct with mild hemorrhagic conversion and a punctate right ACA infarct.   EF 35 to 40%. Severe LA dilation, LDL 71 A1c 4.6  UDS negative.  VTE prophylaxis -Lovenox No antithrombotic prior to admission, on Eliquis 5 mg bid.  Therapy recommendations:  CIR Disposition: CIR today    New Afib RVR Cardiomyopathy Tachybradycardia syndrome Cardiology and EP consulted  Amio IV -> PO 460m BID x1 week and then 2062mdaily Defer cardiac ischemic evaluation for now, but may consider GDMT later Eliquis started 8/21   Hypertension Home meds:  none  Stable Did req  Cleviprex gtt, now off Long-term BP goal normotensive   Hyperlipidemia Home meds: atorvastatin 20 LDL 71, goal < 70 Now on Lipitor 40 Continue statin at discharge   Dysphagia Did not pass swallow Has core track placed -> replaced 8/21 On tube feeding Speech on board   Cognitive impairment Delirium/sundowning Family reported cognitive impairment at baseline In-hospital delirium/sundowning On Seroquel 25 nightly and and 12.24m72mRN agitation On Depakote 250 every 8h Close monitoring, taper off meds once able   Other Stroke Risk Factors Advanced Age >/= 65 80hronic Heart Failure with mitral regurgitation- start spironolactone today per cardiology and follow up outpatient for ischemic workup  and cardiac CT  No BB due to bradycardia   Other Active Problems BPH  DISCHARGE EXAM Blood pressure 115/61, pulse (!) 56, temperature 98.7 F (37.1 C), temperature source Oral, resp. rate (!) 21, height 6' (1.829 m), weight 68.2 kg, SpO2 98 %. General:  Alert, well-nourished, well-developed patient in no acute distress Respiratory:  Regular, unlabored respirations on room air  NEURO:  Mental Status: AA&Ox1, will intermittently follow commands or mimic Speech/Language: speech is with receptive and expressive aphasia and some dysarthria  Cranial Nerves:  II: PERRL.  III, IV, VI: left gaze preference VII: right sided facial droop VIII: hearing intact to voice. Motor: 4/5 strength to LUE and BLE, 3+/5 to RUE Tone: is normal and bulk is normal Sensation- appears intact to light touch bilaterally.  Gait- deferred    Discharge Diet      Diet   Diet NPO time specified   liquids  DISCHARGE PLAN Disposition:  [facility], OT and ST Eliquis (apixaban) daily for secondary stroke prevention indefinitely Recommend ongoing stroke risk factor control by Primary Care Physician at time of discharge from inpatient rehabilitation. Follow-up PCP  [facility] 2 weeks following discharge from rehab. Follow-up in [facility] in 8 weeks following discharge from rehab, office to schedule an appointment.  35 minutes were spent preparing discharge.  [place] , MSN, AGACNP-BC [facility] See Amion for schedule and pager information 08/12/2022 10:18 AM   ATTENDING NOTE: I reviewed above note and agree with the assessment and plan. Pt was seen and examined.   Wife at the bedside. Pt lying in bed, lethargic but open eyes on vice and greeting to provider. Still has cortrak on TF. On eliquis amiodarone and statin via tube. Still has aphasia and RUE weakness. HR stable, will d/c to CIR today for further rehab. Follow up at [place] in 4 weeks. Follow up with cardiology as outpt.   For detailed assessment and plan, please refer to above/below as I have made changes wherever appropriate.   [name], MD PhD Stroke Neurology 08/12/2022 12:10 PM

## 2022-08-12 NOTE — Plan of Care (Signed)
Pt to admit today 

## 2022-08-12 NOTE — H&P (Signed)
Physical Medicine and Rehabilitation Admission H&P    Acute let ischemic stroke  HPI: Dustin Blanchard is a 71 year old right-handed male with history of hypertension, hyperlipidemia, sinus bradycardia/chronic CHF with mitral regurgitation, BPH, history of tobacco use, vascular dementia diagnosed December 2022.  Per chart review patient lives with spouse.  1 level home 3 steps to entry.  Independent prior to admission and driving.  Presented 08/06/2022 with acute onset of right-sided weakness and aphasia.  Cranial CT scan showed small age-indeterminate left PCA infarct at the left occipital lobe.,  CT cervical spine negative.  CT angiogram head and neck positive for left MCA territory oligemia, approximately 120 mL.  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  Hospital course cardiology consulted for new A-fib RVR/CHF.  Echocardiogram showed coarse trabeculation of the apex without obvious thrombus.  Left ventricular ejection fraction 35 to 40%.  No evidence of cardiac tamponade.  Patient initially placed on Cardizem drip close monitoring of bradycardia.  He was switched to amiodarone.  Plan outpatient ischemic work-up after recovery from CVA.  Eliquis has been initiated for CVA prophylaxis as well as atrial fibrillation.  He is currently n.p.o. with alternative means of nutritional support.  Bouts of sundowning with noted history of vascular dementia maintained on Seroquel as well as Depakote.  Therapy evaluations completed due to patient decreased functional mobility right side weakness was admitted for a comprehensive rehab program. Wife at bedside can provide 24/7 supervision.  Review of Systems  Constitutional:  Negative for chills and fever.  HENT:  Negative for hearing loss.   Eyes:   Negative for blurred vision and double vision.  Respiratory:  Negative for cough and shortness of breath.   Cardiovascular:  Positive for palpitations. Negative for chest pain and leg swelling.  Gastrointestinal:  Positive for constipation. Negative for heartburn, nausea and vomiting.  Genitourinary:  Positive for urgency. Negative for dysuria, flank pain and hematuria.  Musculoskeletal:  Positive for joint pain and myalgias.  Skin:  Negative for rash.  Neurological:  Positive for speech change and weakness.  Psychiatric/Behavioral:  Positive for memory loss. The patient has insomnia.   All other systems reviewed and are negative.  Past Medical History:  Diagnosis Date   Dementia [facility])    Past Surgical History:  Procedure Laterality Date   HERNIA REPAIR     HIP SURGERY     IR CT HEAD LTD  08/06/2022   IR CT HEAD LTD  08/06/2022   IR PERCUTANEOUS ART THROMBECTOMY/INFUSION INTRACRANIAL INC DIAG ANGIO  08/06/2022   IR US GUIDE VASC ACCESS RIGHT  08/06/2022   RADIOLOGY WITH ANESTHESIA N/A 08/06/2022   Procedure: IR WITH ANESTHESIA;  Surgeon: Luanne Bras, MD;  Location: [facility];  Service: Radiology;  Laterality: N/A;   No family history on file. Social History:  reports that he has quit smoking. He has never used smokeless tobacco. He reports that he does not drink alcohol. No history on file for drug use. Allergies: No Known Allergies Medications Prior to Admission  Medication Sig Dispense Refill   acetaminophen (TYLENOL) 500 MG tablet Take 500 mg by mouth as needed (pain).     atorvastatin (LIPITOR) 20 MG tablet Take 20 mg by mouth daily.     Cholecalciferol (VITAMIN D3) 125 MCG (5000 UT) CAPS Take 5,000 Units by  mouth daily.     ibuprofen (ADVIL) 200 MG tablet Take 200 mg by mouth as needed (pain).     polyethylene glycol (MIRALAX / GLYCOLAX) 17 g packet Take 17 g by mouth daily as needed for mild constipation.     tamsulosin (FLOMAX) 0.4 MG CAPS capsule Take 0.4 mg by mouth daily.       Home: Home Living Family/patient expects to be discharged to:: Private residence Living Arrangements: Spouse/significant other Available Help at Discharge: Family, Available 24 hours/day Type of Home: House Home Access: Stairs to enter [employer] of Steps: 3 Entrance Stairs-Rails: None Home Layout: One level Bathroom Shower/Tub: Engineer, 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systems: Handicapped height Bathroom Accessibility: Yes Home Equipment: Medical laboratory scientific officer - single point, BSC/3in1, Information systems manager  Lives With: Spouse   Functional History: Prior Function Prior Level of Function : Independent/Modified Independent, Driving Mobility Comments: no use of DME, enjoys working in yard ADLs Comments: independent   Functional Status:  Mobility: Bed Mobility Overal bed mobility: Needs Assistance Bed Mobility: Supine to Sit, Rolling Rolling: Min assist Supine to sit: Min assist General bed mobility comments: in recliner upon entry and exit Transfers Overall transfer level: Needs assistance Equipment used: Rolling walker (2 wheels) Transfers: Sit to/from Stand Sit to Stand: Min assist General transfer comment: stood from recliner with RW and min A but required max hand over hand cues for hand placement on RW and pt attempting to pull at lines/leads Ambulation/Gait Ambulation/Gait assistance: +2 safety/equipment, Mod assist Gait Distance (Feet): 125 Feet Assistive device: Rolling walker (2 wheels) Gait Pattern/deviations: Step-through pattern, Narrow base of support, Shuffle, Drifts right/left, Decreased step length - right, Decreased step length - left, Trunk flexed General Gait Details: pt required mod A to steer RW as pt with significant R inattention. Required +2 for recliner follow and +3 to manage lines/leads. Pt required cues for upright posture/gaze but with poor carry over of cues Gait velocity: decreased Gait velocity interpretation: <1.31 ft/sec, indicative of household ambulator    ADL: ADL Overall ADL's : Needs assistance/impaired Eating/Feeding: NPO (coretrack) Grooming: Moderate assistance Upper Body Bathing: Moderate assistance Lower Body Bathing: Moderate assistance Upper Body Dressing : Moderate assistance Lower Body Dressing: Moderate assistance Toilet Transfer: Minimal assistance, Ambulation Toileting- Clothing Manipulation and Hygiene: Total assistance Toileting - Clothing Manipulation Details (indicate cue type and reason): incontinenet of BM; wife states her husband was very "fidgity" earlier and she feels he was most likely needing to have a BM; May benefit fomr use of picture communication baord - Functional mobility during ADLs: Minimal assistance, +2 for safety/equipment   Cognition: Cognition Overall Cognitive Status: Difficult to assess Arousal/Alertness:  (due to aphasia) Orientation Level: Disoriented to place, Disoriented to time, Disoriented to situation Attention: Focused Focused Attention: Impaired Focused Attention Impairment: Verbal complex Cognition Arousal/Alertness: Awake/alert Behavior During Therapy: Flat affect Overall Cognitive Status: Difficult to assess General Comments: pt moaning and appeared restless and eager to get up from recliner Difficult to assess due to: Impaired communication      Physical Exam: There were no vitals taken for this visit. Physical Exam Gen: no distress, normal appearing, BMI 20.39 HEENT: oral mucosa pink and moist, NCAT, Cortrak in place Cardio: Reg rate Chest: normal effort, normal rate of breathing Abd: soft, non-distended Ext: no edema Psych: pleasant, normal affect Skin: intact Neurological:     Comments: Patient is alert.  Right facial droop. Globally aphasic.  He would follow some simple motor commands with significant prompting.     Results for orders placed  or performed during the hospital encounter of 08/06/22 (from the past 48 hour(s))  Glucose, capillary     Status:  Abnormal   Collection Time: 08/10/22  8:11 PM  Result Value Ref Range   Glucose-Capillary 148 (H) 70 - 99 mg/dL    Comment: Glucose reference range applies only to samples taken after fasting for at least 8 hours.  Glucose, capillary     Status: Abnormal   Collection Time: 08/11/22 12:08 AM  Result Value Ref Range   Glucose-Capillary 134 (H) 70 - 99 mg/dL    Comment: Glucose reference range applies only to samples taken after fasting for at least 8 hours.  Comprehensive metabolic panel     Status: Abnormal   Collection Time: 08/11/22  4:19 AM  Result Value Ref Range   Sodium 140 135 - 145 mmol/L   Potassium 4.0 3.5 - 5.1 mmol/L   Chloride 110 98 - 111 mmol/L   CO2 21 (L) 22 - 32 mmol/L   Glucose, Bld 132 (H) 70 - 99 mg/dL    Comment: Glucose reference range applies only to samples taken after fasting for at least 8 hours.   BUN 21 8 - 23 mg/dL   Creatinine, Ser 0.89 0.61 - 1.24 mg/dL   Calcium 8.9 8.9 - 10.3 mg/dL   Total Protein 5.3 (L) 6.5 - 8.1 g/dL   Albumin 2.8 (L) 3.5 - 5.0 g/dL   AST 17 15 - 41 U/L   ALT 12 0 - 44 U/L   Alkaline Phosphatase 52 38 - 126 U/L   Total Bilirubin 0.7 0.3 - 1.2 mg/dL   GFR, Estimated >60 >60 mL/min    Comment: (NOTE) Calculated using the CKD-EPI Creatinine Equation (2021)    Anion gap 9 5 - 15    Comment: Performed at [facility], [place] [address] [place], [place] [address]  Magnesium     Status: None   Collection Time: 08/11/22  4:19 AM  Result Value Ref Range   Magnesium 2.1 1.7 - 2.4 mg/dL    Comment: Performed at [facility] [address] [place], [place] [address]  Glucose, capillary     Status: Abnormal   Collection Time: 08/11/22  4:43 AM  Result Value Ref Range   Glucose-Capillary 136 (H) 70 - 99 mg/dL    Comment: Glucose reference range applies only to samples taken after fasting for at least 8 hours.  Glucose, capillary     Status: Abnormal   Collection Time: 08/11/22  8:06 AM  Result Value Ref Range    Glucose-Capillary 153 (H) 70 - 99 mg/dL    Comment: Glucose reference range applies only to samples taken after fasting for at least 8 hours.  Glucose, capillary     Status: Abnormal   Collection Time: 08/11/22 11:36 AM  Result Value Ref Range   Glucose-Capillary 120 (H) 70 - 99 mg/dL    Comment: Glucose reference range applies only to samples taken after fasting for at least 8 hours.  Glucose, capillary     Status: Abnormal   Collection Time: 08/11/22  4:04 PM  Result Value Ref Range   Glucose-Capillary 110 (H) 70 - 99 mg/dL    Comment: Glucose reference range applies only to samples taken after fasting for at least 8 hours.  Glucose, capillary     Status: Abnormal   Collection Time: 08/11/22  8:39 PM  Result Value Ref Range   Glucose-Capillary 123 (H) 70 - 99 mg/dL  Comment: Glucose reference range applies only to samples taken after fasting for at least 8 hours.  Glucose, capillary     Status: Abnormal   Collection Time: 08/12/22 12:01 AM  Result Value Ref Range   Glucose-Capillary 124 (H) 70 - 99 mg/dL    Comment: Glucose reference range applies only to samples taken after fasting for at least 8 hours.  Glucose, capillary     Status: Abnormal   Collection Time: 08/12/22  5:02 AM  Result Value Ref Range   Glucose-Capillary 110 (H) 70 - 99 mg/dL    Comment: Glucose reference range applies only to samples taken after fasting for at least 8 hours.  Glucose, capillary     Status: Abnormal   Collection Time: 08/12/22  8:12 AM  Result Value Ref Range   Glucose-Capillary 103 (H) 70 - 99 mg/dL    Comment: Glucose reference range applies only to samples taken after fasting for at least 8 hours.   Comment 1 Notify RN    Comment 2 Document in Chart   Comprehensive metabolic panel     Status: Abnormal   Collection Time: 08/12/22  8:53 AM  Result Value Ref Range   Sodium 142 135 - 145 mmol/L   Potassium 4.0 3.5 - 5.1 mmol/L   Chloride 109 98 - 111 mmol/L   CO2 28 22 - 32 mmol/L    Glucose, Bld 100 (H) 70 - 99 mg/dL    Comment: Glucose reference range applies only to samples taken after fasting for at least 8 hours.   BUN 20 8 - 23 mg/dL   Creatinine, Ser 2.77 0.61 - 1.24 mg/dL   Calcium 8.8 (L) 8.9 - 10.3 mg/dL   Total Protein 5.3 (L) 6.5 - 8.1 g/dL   Albumin 2.8 (L) 3.5 - 5.0 g/dL   AST 18 15 - 41 U/L   ALT 15 0 - 44 U/L   Alkaline Phosphatase 52 38 - 126 U/L   Total Bilirubin 1.3 (H) 0.3 - 1.2 mg/dL   GFR, Estimated >82 >42 mL/min    Comment: (NOTE) Calculated using the CKD-EPI Creatinine Equation (2021)    Anion gap 5 5 - 15    Comment: Performed at [facility], [address], [place], [place] [address]  Magnesium     Status: None   Collection Time: 08/12/22  8:53 AM  Result Value Ref Range   Magnesium 2.2 1.7 - 2.4 mg/dL    Comment: Performed at [facility], [address], [place], [place] [address]  Glucose, capillary     Status: Abnormal   Collection Time: 08/12/22 11:51 AM  Result Value Ref Range   Glucose-Capillary 117 (H) 70 - 99 mg/dL    Comment: Glucose reference range applies only to samples taken after fasting for at least 8 hours.   Comment 1 Notify RN    Comment 2 Document in Chart    No results found.    There were no vitals taken for this visit.  Medical Problem List and Plan: 1. Functional deficits secondary to left MCA patchy infarct and punctate right ACA infarct due to left M3 occlusion status post TNK and revascularization  -patient may shower  -ELOS/Goals: 7-9 days  -Admit to CIR 2.  Antithrombotics: -DVT/anticoagulation:  Pharmaceutical: Eliquis initiated 08/10/2022  -antiplatelet therapy: N/A 3. Pain Management: Tylenol as needed 4. Vascular dementia: continue Valproic acid 250 mg 3 times daily  -antipsychotic agents: Seroquel 25 mg daily with supper and 12.5 mg  3 times daily as needed. 5. Neuropsych/cognition: This patient is not capable of making decisions on his own behalf. 6. Skin/Wound Care: Routine  skin checks 7. Fluids/Electrolytes/Nutrition: Routine in and outs with follow-up chemistries 8.  New onset atrial fibrillation RVR/CHF.  Follow-up cardiology service.  Amiodarone as directed, Aldactone 12.5 mg daily.  Cardiac rate controlled.  Plan outpatient ischemic work-up after recovery from CVA 9.  Dysphagia.  NPO.  Alternative means of nutritional support.  Follow-up speech therapy 10.  Hyperlipidemia.  Continue Lipitor 11. Screening for vitamin D deficiency: check vitamin D level tomorrow. 12. Hyperglycemia: hemoglobin A1c reviewed and is 4.6, continue to monitor.   I have personally performed a face to face diagnostic evaluation, including, but not limited to relevant history and physical exam findings, of this patient and developed relevant assessment and plan.  Additionally, I have reviewed and concur with the physician assistant's documentation above.  Lavon Paganini Angiulli, PA-C   Izora Ribas, MD 08/12/2022

## 2022-08-12 NOTE — Progress Notes (Signed)
Inpatient Rehabilitation Admission Medication Review by a Pharmacist  A complete drug regimen review was completed for this patient to identify any potential clinically significant medication issues.  High Risk Drug Classes Is patient taking? Indication by Medication  Antipsychotic Yes Quetiapine - agitation   Anticoagulant Yes Eliquis - NOAF   Antibiotic No   Opioid No   Antiplatelet No   Hypoglycemics/insulin No   Vasoactive Medication Yes Amiodarone/Spironolactone - Chronic Heart Failure  Chemotherapy No   Other Yes Lipitor - HLD  valproic acid - mood stability  Protonix- GERD     Type of Medication Issue Identified Description of Issue Recommendation(s)  Drug Interaction(s) (clinically significant)     Duplicate Therapy     Allergy     No Medication Administration End Date     Incorrect Dose     Additional Drug Therapy Needed     Significant med changes from prior encounter (inform family/care partners about these prior to discharge).    Other  PTA meds: Vitamin D3 5000 units po daily Flomax 0.4 mg po daily Restart PTA meds when and if necessary during CIR admission or at time of discharge, if warranted    Clinically significant medication issues were identified that warrant physician communication and completion of prescribed/recommended actions by midnight of the next day:  No   Time spent performing this drug regimen review (minutes):  30   Earle Reome BS, PharmD, BCPS Clinical Pharmacist 08/12/2022 1:00 PM  Contact: [phone] after 3 PM  "Be curious, not judgmental..." -Debbora Dus

## 2022-08-12 NOTE — Progress Notes (Signed)
Progress Note  Patient Name: Dustin Blanchard Date of Encounter: 08/12/2022  Primary Cardiologist: Donato Heinz, MD   Subjective   Overnight he has yet to pass swallow. More aware than 08/11/22.  Follows commands with no issues. Nods head to questions.  Inpatient Medications    Scheduled Meds:  [START ON 08/18/2022] amiodarone  200 mg Per Tube Daily   amiodarone  400 mg Per Tube BID   apixaban  5 mg Per Tube BID   atorvastatin  40 mg Per Tube Daily   Chlorhexidine Gluconate Cloth  6 each Topical Daily   feeding supplement (PROSource TF20)  60 mL Per Tube BID   labetalol  20 mg Intravenous Once   multivitamin with minerals  1 tablet Per Tube Daily   pantoprazole  40 mg Per Tube QHS   QUEtiapine  25 mg Per Tube Q supper   spironolactone  12.5 mg Per Tube Daily   valproic acid  250 mg Per Tube TID AC   Continuous Infusions:  feeding supplement (OSMOLITE 1.5 CAL) 1,000 mL (08/10/22 1457)   PRN Meds: acetaminophen **OR** acetaminophen (TYLENOL) oral liquid 160 mg/5 mL **OR** acetaminophen, mouth rinse, QUEtiapine, senna-docusate   Vital Signs    Vitals:   08/11/22 2300 08/12/22 0430 08/12/22 0500 08/12/22 0726  BP: 101/62 110/66  115/61  Pulse:  (!) 52  (!) 56  Resp:    (!) 21  Temp:  98.7 F (37.1 C)  98.7 F (37.1 C)  TempSrc:  Oral  Oral  SpO2: 98% 100%  98%  Weight:   68.2 kg   Height:        Intake/Output Summary (Last 24 hours) at 08/12/2022 0857 Last data filed at 08/12/2022 0600 Gross per 24 hour  Intake 1531 ml  Output 1100 ml  Net 431 ml   Filed Weights   08/10/22 0359 08/11/22 0446 08/12/22 0500  Weight: 66.2 kg 67.8 kg 68.2 kg    Telemetry    Sinus bradycardia, with some junctional bradycardia; some Mobtiz Type I HB Personally Reviewed  Physical Exam   Gen: no distress   Neck: No JVD Cardiac: No Rubs or Gallops, Respiratory: Slight crackles in anterior left lung field only, improved with cough GI: Soft, nontender, non-distended   MS: No edema Integument: Skin feels warm  Labs    Chemistry Recent Labs  Lab 08/09/22 0603 08/10/22 0647 08/11/22 0419  NA 138 141 140  K 3.8 4.3 4.0  CL 112* 110 110  CO2 21* 24 21*  GLUCOSE 132* 97 132*  BUN 14 12 21   CREATININE 0.67 0.78 0.89  CALCIUM 8.4* 8.9 8.9  PROT 5.3* 6.0* 5.3*  ALBUMIN 3.0* 3.1* 2.8*  AST 15 16 17   ALT 10 13 12   ALKPHOS 55 58 52  BILITOT 1.0 1.1 0.7  GFRNONAA >60 >60 >60  ANIONGAP 5 7 9      Hematology Recent Labs  Lab 08/06/22 0437  WBC 4.2  RBC 4.22  HGB 13.7  HCT 40.5  MCV 96.0  MCH 32.5  MCHC 33.8  RDW 12.3  PLT 210    Cardiac EnzymesNo results for input(s): "TROPONINI" in the last 168 hours. No results for input(s): "TROPIPOC" in the last 168 hours.   BNPNo results for input(s): "BNP", "PROBNP" in the last 168 hours.   DDimer No results for input(s): "DDIMER" in the last 168 hours.   Radiology    DG Abd Portable 1V  Result Date: 08/10/2022 CLINICAL DATA:  Is a  gastric tube placement. EXAM: PORTABLE ABDOMEN - 1 VIEW COMPARISON:  08/07/2022. FINDINGS: Feeding tube terminates in the distal gastric antrum. There is retained contrast in the colon. Visualized lung bases are grossly clear. IMPRESSION: Feeding tube terminates in the distal gastric antrum. Electronically Signed   By: Leanna Battles M.D.   On: 08/10/2022 14:18    Cardiac Studies   ECHO COMPLETE WO IMAGING ENHANCING AGENT 08/06/2022  Narrative ECHOCARDIOGRAM REPORT    Patient Name:   Dustin Blanchard Date of Exam: 08/06/2022 Medical Rec #:  [id number]      Height:       72.0 in Accession #:    [id number]     Weight:       158.7 lb Date of Birth:  [birth year]       BSA:          1.931 m Patient Age:    71 years       BP:           136/90 mmHg Patient Gender: M              HR:           81 bpm. Exam Location:  Inpatient  Procedure: 2D Echo, Cardiac Doppler and Color Doppler  Indications:    Stroke  History:        Patient has no prior history of  Echocardiogram examinations. Acute ischemic left MCA stroke (HCC), Dementia.  Sonographer:    Celesta Gentile RCS Referring Phys: [id number] SRISHTI L BHAGAT  IMPRESSIONS   1. Coarse trabeculation of the apex without obvious thrombus. Left ventricular ejection fraction, by estimation, is 35 to 40%. Left ventricular ejection fraction by 2D MOD biplane is 37.1 %. The left ventricle has moderately decreased function. The left ventricle demonstrates global hypokinesis. There is mild left ventricular hypertrophy. Left ventricular diastolic parameters are consistent with Grade III diastolic dysfunction (restrictive). Elevated left ventricular end-diastolic pressure. 2. Right ventricular systolic function is low normal. The right ventricular size is normal. There is mildly elevated pulmonary artery systolic pressure. The estimated right ventricular systolic pressure is 44.2 mmHg. 3. Left atrial size was severely dilated. 4. Moderate pericardial effusion. The pericardial effusion is circumferential. There is no evidence of cardiac tamponade. 5. The mitral valve is abnormal. Moderate mitral valve regurgitation. 6. The aortic valve is tricuspid. Aortic valve regurgitation is trivial. 7. The inferior vena cava is normal in size with greater than 50% respiratory variability, suggesting right atrial pressure of 3 mmHg.  Comparison(s): No prior Echocardiogram.  FINDINGS Left Ventricle: Coarse trabeculation of the apex without obvious thrombus. Left ventricular ejection fraction, by estimation, is 35 to 40%. Left ventricular ejection fraction by 2D MOD biplane is 37.1 %. The left ventricle has moderately decreased function. The left ventricle demonstrates global hypokinesis. The left ventricular internal cavity size was normal in size. There is mild left ventricular hypertrophy. Left ventricular diastolic parameters are consistent with Grade III diastolic dysfunction (restrictive). Elevated left ventricular  end-diastolic pressure.  Right Ventricle: The right ventricular size is normal. No increase in right ventricular wall thickness. Right ventricular systolic function is low normal. There is mildly elevated pulmonary artery systolic pressure. The tricuspid regurgitant velocity is 3.21 m/s, and with an assumed right atrial pressure of 3 mmHg, the estimated right ventricular systolic pressure is 44.2 mmHg.  Left Atrium: Left atrial size was severely dilated.  Right Atrium: Right atrial size was normal in size.  Pericardium: A moderately sized pericardial effusion is present. The  pericardial effusion is circumferential. There is diastolic collapse of the right atrial wall and diastolic collapse of the right ventricular free wall. There is no evidence of cardiac tamponade.  Mitral Valve: The mitral valve is abnormal. There is mild thickening of the anterior and posterior mitral valve leaflet(s). Moderate mitral valve regurgitation.  Tricuspid Valve: The tricuspid valve is grossly normal. Tricuspid valve regurgitation is mild.  Aortic Valve: The aortic valve is tricuspid. Aortic valve regurgitation is trivial.  Pulmonic Valve: The pulmonic valve was grossly normal. Pulmonic valve regurgitation is trivial.  Aorta: The aortic root and ascending aorta are structurally normal, with no evidence of dilitation.  Venous: The inferior vena cava is normal in size with greater than 50% respiratory variability, suggesting right atrial pressure of 3 mmHg.  IAS/Shunts: No atrial level shunt detected by color flow Doppler.   LEFT VENTRICLE PLAX 2D                        Biplane EF (MOD) LVIDd:         5.40 cm         LV Biplane EF:   Left LVIDs:         3.90 cm                          ventricular LV PW:         1.30 cm                          ejection LV IVS:        1.10 cm                          fraction by LVOT diam:     2.00 cm                          2D MOD LV SV:         47                                biplane is LV SV Index:   24                               37.1 %. LVOT Area:     3.14 cm Diastology LV e' medial:    5.98 cm/s LV Volumes (MOD)               LV E/e' medial:  15.6 LV vol d, MOD    125.5 ml      LV e' lateral:   8.49 cm/s A2C:                           LV E/e' lateral: 11.0 LV vol d, MOD    140.8 ml A4C: LV vol s, MOD    76.4 ml A2C: LV vol s, MOD    88.4 ml A4C: LV SV MOD A2C:   49.1 ml LV SV MOD A4C:   140.8 ml LV SV MOD BP:    50.3 ml  RIGHT VENTRICLE RV S prime:     10.30 cm/s TAPSE (M-mode): 1.6 cm  LEFT ATRIUM  Index        RIGHT ATRIUM           Index LA diam:        4.30 cm  2.23 cm/m   RA Area:     19.80 cm LA Vol (A2C):   94.0 ml  48.67 ml/m  RA Volume:   51.10 ml  26.46 ml/m LA Vol (A4C):   102.0 ml 52.82 ml/m LA Biplane Vol: 107.0 ml 55.40 ml/m AORTIC VALVE LVOT Vmax:   75.60 cm/s LVOT Vmean:  48.600 cm/s LVOT VTI:    0.150 m  AORTA Ao Root diam: 4.10 cm  MITRAL VALVE               TRICUSPID VALVE MV Area (PHT): 5.66 cm    TR Peak grad:   41.2 mmHg MV Decel Time: 134 msec    TR Vmax:        321.00 cm/s MV E velocity: 93.30 cm/s MV A velocity: 30.20 cm/s  SHUNTS MV E/A ratio:  3.09        Systemic VTI:  0.15 m Systemic Diam: 2.00 cm  Zoila Shutter MD Electronically signed by Zoila Shutter MD Signature Date/Time: 08/06/2022/4:42:49 PM    Final     Patient Profile     71 y.o. 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   Medication Recommendations:  Amiodarone, aldactone, eliquis Other recommendations (labs, testing, etc):  will arrange outpatient ischemic testing after he is seen in follow up Follow up as an outpatient:  Will arrange cardiology f/u     For questions or updates, please contact [facility] Please consult [email or web address] for contact info under Cardiology/STEMI.      Riley Lam, MD FASE Cardiologist [facility]  [address] [place], [address] [place], [place] [address] [phone]  8:57 AM

## 2022-08-13 ENCOUNTER — Inpatient Hospital Stay (HOSPITAL_COMMUNITY): Payer: Medicare HMO

## 2022-08-13 DIAGNOSIS — I63512 Cerebral infarction due to unspecified occlusion or stenosis of left middle cerebral artery: Secondary | ICD-10-CM | POA: Diagnosis not present

## 2022-08-13 LAB — CBC WITH DIFFERENTIAL/PLATELET
Abs Immature Granulocytes: 0.05 10*3/uL (ref 0.00–0.07)
Basophils Absolute: 0 10*3/uL (ref 0.0–0.1)
Basophils Relative: 0 %
Eosinophils Absolute: 0 10*3/uL (ref 0.0–0.5)
Eosinophils Relative: 1 %
HCT: 41.7 % (ref 39.0–52.0)
Hemoglobin: 14.3 g/dL (ref 13.0–17.0)
Immature Granulocytes: 1 %
Lymphocytes Relative: 10 %
Lymphs Abs: 0.8 10*3/uL (ref 0.7–4.0)
MCH: 32.1 pg (ref 26.0–34.0)
MCHC: 34.3 g/dL (ref 30.0–36.0)
MCV: 93.7 fL (ref 80.0–100.0)
Monocytes Absolute: 0.7 10*3/uL (ref 0.1–1.0)
Monocytes Relative: 9 %
Neutro Abs: 6.4 10*3/uL (ref 1.7–7.7)
Neutrophils Relative %: 79 %
Platelets: 214 10*3/uL (ref 150–400)
RBC: 4.45 MIL/uL (ref 4.22–5.81)
RDW: 11.9 % (ref 11.5–15.5)
WBC: 8 10*3/uL (ref 4.0–10.5)
nRBC: 0 % (ref 0.0–0.2)

## 2022-08-13 LAB — CBC
HCT: 36.2 % — ABNORMAL LOW (ref 39.0–52.0)
Hemoglobin: 12.4 g/dL — ABNORMAL LOW (ref 13.0–17.0)
MCH: 29.6 pg (ref 26.0–34.0)
MCHC: 34.3 g/dL (ref 30.0–36.0)
MCV: 86.4 fL (ref 80.0–100.0)
Platelets: 319 10*3/uL (ref 150–400)
RBC: 4.19 MIL/uL — ABNORMAL LOW (ref 4.22–5.81)
RDW: 14.7 % (ref 11.5–15.5)
WBC: 9.3 10*3/uL (ref 4.0–10.5)
nRBC: 0 % (ref 0.0–0.2)

## 2022-08-13 LAB — COMPREHENSIVE METABOLIC PANEL
ALT: 14 U/L (ref 0–44)
AST: 16 U/L (ref 15–41)
Albumin: 2.9 g/dL — ABNORMAL LOW (ref 3.5–5.0)
Alkaline Phosphatase: 49 U/L (ref 38–126)
Anion gap: 7 (ref 5–15)
BUN: 22 mg/dL (ref 8–23)
CO2: 23 mmol/L (ref 22–32)
Calcium: 9.1 mg/dL (ref 8.9–10.3)
Chloride: 111 mmol/L (ref 98–111)
Creatinine, Ser: 0.8 mg/dL (ref 0.61–1.24)
GFR, Estimated: >60 mL/min (ref >60)
Glucose, Bld: 140 mg/dL — ABNORMAL HIGH (ref 70–99)
Potassium: 4.4 mmol/L (ref 3.5–5.1)
Sodium: 141 mmol/L (ref 135–145)
Total Bilirubin: 1.2 mg/dL (ref 0.3–1.2)
Total Protein: 5.8 g/dL — ABNORMAL LOW (ref 6.5–8.1)

## 2022-08-13 LAB — BASIC METABOLIC PANEL
Anion gap: 9 (ref 5–15)
BUN: 30 mg/dL — ABNORMAL HIGH (ref 8–23)
CO2: 26 mmol/L (ref 22–32)
Calcium: 9 mg/dL (ref 8.9–10.3)
Chloride: 98 mmol/L (ref 98–111)
Creatinine, Ser: 1.32 mg/dL — ABNORMAL HIGH (ref 0.61–1.24)
GFR, Estimated: 58 mL/min — ABNORMAL LOW (ref >60)
Glucose, Bld: 211 mg/dL — ABNORMAL HIGH (ref 70–99)
Potassium: 3.6 mmol/L (ref 3.5–5.1)
Sodium: 133 mmol/L — ABNORMAL LOW (ref 135–145)

## 2022-08-13 LAB — VITAMIN D 25 HYDROXY (VIT D DEFICIENCY, FRACTURES): Vit D, 25-Hydroxy: 58.3 ng/mL (ref 30–100)

## 2022-08-13 MED ORDER — CHOLECALCIFEROL 10 MCG/ML (400 UNIT/ML) PO LIQD
800.0000 [IU] | Freq: Every day | ORAL | Status: DC
  Filled 2022-08-13: qty 2

## 2022-08-13 MED ORDER — CHOLECALCIFEROL 10 MCG (400 UNIT) PO TABS
800.0000 [IU] | ORAL_TABLET | Freq: Every day | ORAL | Status: DC
  Administered 2022-08-13 – 2022-08-19 (×5): 800 [IU]
  Filled 2022-08-13 (×7): qty 2

## 2022-08-13 MED ORDER — FREE WATER
170.0000 mL | Status: DC
  Administered 2022-08-13 – 2022-08-17 (×19): 170 mL

## 2022-08-13 MED ORDER — B COMPLEX-C PO TABS
1.0000 | ORAL_TABLET | Freq: Every day | ORAL | Status: DC
  Administered 2022-08-13 – 2022-08-14 (×2): 1 via ORAL
  Filled 2022-08-13 (×2): qty 1

## 2022-08-13 MED ORDER — CEFAZOLIN SODIUM-DEXTROSE 1-4 GM/50ML-% IV SOLN
1.0000 g | Freq: Three times a day (TID) | INTRAVENOUS | Status: AC
Start: 2022-08-13 — End: 2022-08-19
  Administered 2022-08-14 – 2022-08-19 (×18): 1 g via INTRAVENOUS
  Filled 2022-08-13 (×19): qty 50

## 2022-08-13 MED ORDER — VALPROIC ACID 250 MG/5ML PO SOLN
250.0000 mg | Freq: Two times a day (BID) | ORAL | Status: DC
Start: 2022-08-13 — End: 2022-08-14
  Administered 2022-08-13: 250 mg
  Filled 2022-08-13: qty 5

## 2022-08-13 MED ORDER — OSMOLITE 1.5 CAL PO LIQD
1000.0000 mL | ORAL | Status: DC
  Administered 2022-08-13 (×2): 1000 mL

## 2022-08-13 MED ORDER — PROSOURCE TF20 ENFIT COMPATIBL EN LIQD
60.0000 mL | Freq: Every day | ENTERAL | Status: DC
  Administered 2022-08-14 – 2022-08-16 (×3): 60 mL
  Filled 2022-08-13 (×3): qty 60

## 2022-08-13 NOTE — Progress Notes (Signed)
Patient reassessed and found to be pooling oral secretions. Suctioned frequently as needed. No evidence of regurgitation of tube feed content. Chest sound a bit crackly. Patient remains in upright position. VS stable. NG Tube feeding running at recommended rate (50 mls/hr). No sign of respiratory distress noted.Patient made comfortable in bed. Will inform team/on-call of progress so far.

## 2022-08-13 NOTE — Progress Notes (Signed)
Inpatient Rehabilitation Center Individual Statement of Services  Patient Name:  Dustin Blanchard  Date:  08/13/2022  Welcome to the Inpatient Rehabilitation Center.  Our goal is to provide you with an individualized program based on your diagnosis and situation, designed to meet your specific needs.  With this comprehensive rehabilitation program, you will be expected to participate in at least 3 hours of rehabilitation therapies Monday-Friday, with modified therapy programming on the weekends.  Your rehabilitation program will include the following services:  Physical Therapy (PT), Occupational Therapy (OT), Speech Therapy (ST), 24 hour per day rehabilitation nursing, Care Coordinator, Rehabilitation Medicine, Nutrition Services, and Pharmacy Services  Weekly team conferences will be held on Wednesday to discuss your progress.  Your Inpatient Rehabilitation Care Coordinator will talk with you frequently to get your input and to update you on team discussions.  Team conferences with you and your family in attendance may also be held.  Expected length of stay: 2 weeks pending progress  Overall anticipated outcome: min-mod level of assist  Depending on your progress and recovery, your program may change. Your Inpatient Rehabilitation Care Coordinator will coordinate services and will keep you informed of any changes. Your Inpatient Rehabilitation Care Coordinator's name and contact numbers are listed  below.  The following services may also be recommended but are not provided by the Inpatient Rehabilitation Center:  Driving Evaluations Home Health Rehabiltiation Services Outpatient Rehabilitation Services    Arrangements will be made to provide these services after discharge if needed.  Arrangements include referral to agencies that provide these services.  Your insurance has been verified to be:  [employer] Your primary doctor is:  [employer]  Pertinent information will be  shared with your doctor and your insurance company.  Inpatient Rehabilitation Care Coordinator:  Dossie Der, [place] [phone] or Luna [place]  Information discussed with and copy given to patient by: Lucy Chris, 08/13/2022, 1:06 PM

## 2022-08-13 NOTE — Progress Notes (Signed)
Inpatient Rehabilitation  Patient information reviewed and entered into eRehab system by Mykael Batz M. Rylinn Linzy, M.A., CCC/SLP, PPS Coordinator.  Information including medical coding, functional ability and quality indicators will be reviewed and updated through discharge.    

## 2022-08-13 NOTE — Progress Notes (Addendum)
PROGRESS NOTE   Subjective/Complaints: No new complaints this morning Sleepy Was pooling oral secretions overnight.  Will order CXR given chest crackles  ROS: unable to obtain given mental status.  Objective:   DG Abd Portable 1V  Result Date: 08/13/2022 CLINICAL DATA:  NG placement. EXAM: PORTABLE ABDOMEN - 1 VIEW COMPARISON:  Chest radiograph dated 08/12/2022. FINDINGS: Enteric tube with tip and side-port in the left upper abdomen, in the body of the stomach. Mild cardiomegaly. No acute osseous pathology. IMPRESSION: Enteric tube with tip and side-port in the body of the stomach. Electronically Signed   By: Elgie Collard M.D.   On: 08/13/2022 00:17   DG Abd Portable 1V  Result Date: 08/12/2022 CLINICAL DATA:  Feeding tube placement EXAM: PORTABLE ABDOMEN - 1 VIEW COMPARISON:  08/10/2022 FINDINGS: Limited radiograph of the lower chest and upper abdomen was obtained for the purposes of enteric tube localization. Enteric tube is seen coursing below the diaphragm with distal tip terminating within the upper chest approximately 19 cm above the level of the GE junction. IMPRESSION: Enteric tube tip terminating within the upper chest centrally, approximately 19 cm above the level of the GE junction. Recommend advancing into the stomach. As the tube projects over the tracheal air column, endotracheal placement of the feeding tube is not excluded. Electronically Signed   By: Duanne Guess D.O.   On: 08/12/2022 16:42   Recent Labs    08/13/22 0520  WBC 8.0  HGB 14.3  HCT 41.7  PLT 214   Recent Labs    08/12/22 0853 08/13/22 0520  NA 142 141  K 4.0 4.4  CL 109 111  CO2 28 23  GLUCOSE 100* 140*  BUN 20 22  CREATININE 0.99 0.80  CALCIUM 8.8* 9.1   No intake or output data in the 24 hours ending 08/13/22 1314      Physical Exam: Vital Signs Blood pressure 138/77, pulse 63, temperature 97.9 F (36.6 C), temperature  source Oral, resp. rate 16, height 6' (1.829 m), weight 68.2 kg, SpO2 100 %. Gen: no distress, normal appearing, BMI 20.39, somnolent HEENT: oral mucosa pink and moist, NCAT, Cortrak in place Cardio: Reg rate Chest: normal effort, normal rate of breathing Abd: soft, non-distended Ext: no edema Psych: pleasant, normal affect Skin: intact Neurological:     Comments: Patient is alert.  Right facial droop. Globally aphasic.  He would follow some simple motor commands with significant prompting.     Assessment/Plan: 1. Functional deficits which require 3+ hours per day of interdisciplinary therapy in a comprehensive inpatient rehab setting. Physiatrist is providing close team supervision and 24 hour management of active medical problems listed below. Physiatrist and rehab team continue to assess barriers to discharge/monitor patient progress toward functional and medical goals  Care Tool:  Bathing              Bathing assist       Upper Body Dressing/Undressing Upper body dressing        Upper body assist      Lower Body Dressing/Undressing Lower body dressing            Lower body assist  Toileting Toileting    Toileting assist Assist for toileting: Dependent - Patient 0%     Transfers Chair/bed transfer  Transfers assist  Chair/bed transfer activity did not occur: Safety/medical concerns        Locomotion Ambulation   Ambulation assist              Walk 10 feet activity   Assist           Walk 50 feet activity   Assist           Walk 150 feet activity   Assist           Walk 10 feet on uneven surface  activity   Assist           Wheelchair     Assist               Wheelchair 50 feet with 2 turns activity    Assist            Wheelchair 150 feet activity     Assist          Blood pressure 138/77, pulse 63, temperature 97.9 F (36.6 C), temperature source Oral, resp. rate  16, height 6' (1.829 m), weight 68.2 kg, SpO2 100 %.  Medical Problem List and Plan: 1. 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  Antithrombotics: -DVT/anticoagulation:  Pharmaceutical: Eliquis initiated 08/10/2022             -antiplatelet therapy: N/A 3. Pain Management: Tylenol as needed 4. 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             -antipsychotic agents: Seroquel 25 mg daily with supper and 12.5 mg 3 times daily as needed. 5. Neuropsych/cognition: This patient is not capable of making decisions on his own behalf. 6. Skin/Wound Care: Routine skin checks 7. Fluids/Electrolytes/Nutrition: Routine in and outs with follow-up chemistries 8.  New onset atrial fibrillation RVR/CHF.  Follow-up cardiology service.  Amiodarone as directed, Aldactone 12.5 mg daily.  Cardiac rate controlled.  Plan outpatient ischemic work-up after recovery from CVA 9.  Dysphagia.  NPO.  Alternative means of nutritional support.  Follow-up speech therapy 10.  Hyperlipidemia.  Continue Lipitor 11. Suboptimal vitamin D level: start 800U daily 12. Hyperglycemia: likely secondary to tube feeds, hemoglobin A1c reviewed and is 4.6, continue to monitor. Does not need CBG checks.  13. Chest crackles: check CXR 14. Worsening lethargy: CT head ordered    LOS: 1 days A FACE TO FACE EVALUATION WAS PERFORMED  Clint Bolder P Sofia Vanmeter 08/13/2022, 1:14 PM

## 2022-08-13 NOTE — Evaluation (Signed)
Speech Language Pathology Assessment and Plan  Patient Details  Name: Dustin Blanchard MRN: [id number] Date of Birth: [birth year]  SLP Diagnosis: Dysphagia;Cognitive Impairments;Speech and Language deficits  Rehab Potential: Fair ELOS: ~2 weeks   Today's Date: 08/13/2022 SLP Individual Time: 0730-0809 SLP Individual Time Calculation (min): 25 min   Hospital Problem: Principal Problem:   Left middle cerebral artery stroke [facility]) Active Problems:   Acute ischemic left MCA stroke [facility])  Past Medical History:  Past Medical History:  Diagnosis Date   Dementia ([place])    Past Surgical History:  Past Surgical History:  Procedure Laterality Date   HERNIA REPAIR     HIP SURGERY     IR CT HEAD LTD  08/06/2022   IR CT HEAD LTD  08/06/2022   IR PERCUTANEOUS ART THROMBECTOMY/INFUSION INTRACRANIAL INC DIAG ANGIO  08/06/2022   IR US GUIDE VASC ACCESS RIGHT  08/06/2022   RADIOLOGY WITH ANESTHESIA N/A 08/06/2022   Procedure: IR WITH ANESTHESIA;  Surgeon: Luanne Bras, MD;  Location: [place];  Service: Radiology;  Laterality: N/A;    Assessment / Plan / Recommendation Clinical Impression History of Present Illness:Dustin Blanchard is a 71 year old right-handed male with history of hypertension, hyperlipidemia, sinus bradycardia/chronic CHF with mitral regurgitation, BPH, history of tobacco use, vascular dementia diagnosed December 2022.  Per chart review patient lives with spouse.  1 level home 3 steps to entry.  Independent prior to admission and driving.  Presented 08/06/2022 with acute onset of right-sided weakness and aphasia.  Cranial CT scan showed small age-indeterminate left PCA infarct at the left occipital lobe.,  CT cervical spine negative.  CT angiogram head and neck positive for left MCA territory oligemia, approximately 120 mL.  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  Patient did receive TNK as well as endovascular revascularization of  occlusion of the left M3 MCA middle division branch 08/06/2022 per interventional radiology.  Hospital course cardiology consulted for new A-fib RVR/CHF.  Echocardiogram showed coarse trabeculation of the apex without obvious thrombus.  Left ventricular ejection fraction 35 to 40%.  No evidence of cardiac tamponade.  Patient initially placed on Cardizem drip close monitoring of bradycardia.  He was switched to amiodarone.  Plan outpatient ischemic work-up after recovery from CVA.  Eliquis has been initiated for CVA prophylaxis as well as atrial fibrillation.  He is currently n.p.o. with alternative means of nutritional support.  Bouts of sundowning with noted history of vascular dementia maintained on Seroquel as well as Depakote.  Therapy evaluations completed due to patient decreased functional mobility right side weakness was admitted for a comprehensive rehab program on 08/12/2022.  SLP consulted to complete CSE and cognitive-linguistic evaluation in the setting of recent L MCA CVA; of note, pt with hx of vascular dementia. Pt received somnolent and lying in bed (HOB > 30 degrees), with NGT in place and tube feeding running and bilateral wrist restraints applied. Per chart review, pt provided with IV Ativan overnight given agitation, and need to place NGT. At onset of evaluation, SLP provided repositioning, increase in environmental stimuli (turning lights on, opening blinds, etc.), sternal rub, music, and cold compression to facilitate arousal, though no functional change in LOA noted. Despite Max A multimodal cues, pt unable to arouse for greater than 2-3 seconds on ~5 different occasions. Non-verbal throughout. During those brief moments of arousal (eyes opened), pt demonstrated head nod "yes" when asked to confirm his first name to be "Dustin Blanchard" and last name to be "  Dustin Blanchard." No further head nor hand gestures observed when asked additional biographical yes/no questions despite Max A. Only vocalization noted  was a non-contextual laugh x 2. Followed one-step commands, within highly automatic/contextual tasks (brushing teeth and washing face), only when given Total A > Max A multimodal cues.  From an oropharyngeal standpoint, oral mechanism significant for R sided weakness (ROM and strength) and seemingly decreased sensation (R  > L) resulting in decreased saliva management (R > L saliva pooling within buccal cavity and lateral sulci); required Total A from SLP for oral suctioning of secretions. Pt unable to follow commands to elicit volitional swallow response, to clear saliva, despite Max A multimodal cues to include hyoglossal assistance. When pt did laugh, vocal quality was noted to be wet and low in intensity. Pt exhibited weak throat clear x 2 that was non-productive in nature. No volitional nor involuntary swallow response observed throughout. Given pt's poor LOA, PO trials were not provided due to safety concerns. Oral care was completed via suction toothbrush with Total A. Provided education to RN and NT re: frequent oral care (QID) and continuation of NPO status.  Per chart review, pt's presentation appears to be a marked difference from acute care at which time pt was answering yes/no questions correctly ~60% of the time and following some basic commands. Suspect pt may be more somnolent, this date, due to medications administered overnight. At this time, skilled ST intervention appears indicated given significant change in status compared to baseline and that of pt's performance during acute admission. Upon improvement in LOA, recommend further evaluation of swallow function and cognitive-communication abilities. If pt's LOA does not improve, may consider palliative care consult to address goals of care re: nutrition and hydration.   Please note, pt missed 21 minutes of evaluation due to somnolence and inability to maintain alertness despite Max A.   Skilled Therapeutic Interventions          Informal  language evaluation completed. Please see above for details.  SLP Assessment  Patient will need skilled Speech Lanaguage Pathology Services during CIR admission    Recommendations  SLP Diet Recommendations: NPO Medication Administration: Via alternative means Supervision:  (N/A) Compensations:  (N/A) Postural Changes and/or Swallow Maneuvers:  (N/A) Oral Care Recommendations: Oral care QID;Staff/trained caregiver to provide oral care Patient destination: Home Follow up Recommendations: Home Health SLP;24 hour supervision/assistance Equipment Recommended: To be determined    SLP Frequency 3 to 5 out of 7 days   SLP Duration  SLP Intensity  SLP Treatment/Interventions ~2 weeks  Minumum of 1-2 x/day, 30 to 90 minutes  Functional tasks;Therapeutic Activities;Patient/family education;Dysphagia/aspiration precaution training    Pain PAINAD (Pain Assessment in Advanced Dementia) Breathing: normal Negative Vocalization: none Facial Expression: smiling or inexpressive Body Language: relaxed Consolability: no need to console PAINAD Score: 0  Prior Functioning Cognitive/Linguistic Baseline: Baseline deficits Baseline deficit details: Per chart review, pt dx with vascular dementia in December 2022 Type of Home: House (per previous documentation; pt unable to provide and caregiver/spouse unavailable at time of evaluation)  Lives With: Spouse (per previous documentation) Available Help at Discharge: Family;Available 24 hours/day (per previous documentation) Education: pt unable to provide information  SLP Evaluation Cognition Overall Cognitive Status: No family/caregiver present to determine baseline cognitive functioning Arousal/Alertness: Lethargic Orientation Level: Oriented to person (Nodded head "yes" when asked if his first name was Terre and last name "Melgoza") Attention: Focused Focused Attention: Impaired Focused Attention Impairment: Verbal basic;Functional  basic Memory:  (UTA) Awareness: Impaired (Pt with  bilateral wrist restraints due to pulling out NGT) Awareness Impairment: Intellectual impairment Problem Solving:  (UTA) Safety/Judgment: Impaired (Pt with bilateral wrist restraints due to pulling out NGT)  Comprehension Auditory Comprehension Overall Auditory Comprehension: Impaired Yes/No Questions: Impaired Basic Biographical Questions: 0-25% accurate (only shook head "yes" x 2 to verify first and last name; did not respond via head nod nor verbalizations to any additional biographical questions.) Basic Immediate Environment Questions: Other (comment) (UTA) Complex Questions: Other (comment) (UTA due to somnolence) Commands: Impaired One Step Basic Commands: 0-24% accurate (Pt followed contextual one-step comamnds on ~15% of opportunities given Max A multimodal cueing ) Two Step Basic Commands: Other (comment) (UTA) Multistep Basic Commands: Other (comment) (UTA) Conversation: Other (comment) (Pt unable to engage in verbal exchange despite multiple attempts and Max A multimodal cues). Interfering Components: Attention Visual Recognition/Discrimination Discrimination:  (UTA) Reading Comprehension Reading Status: Unable to assess (comment) (Pt unable to maintain focused attention/alertness to participate) Expression Expression Primary Mode of Expression: Nonverbal - gestures (Unable to determine - pt non-verbal this date and only communicated through facial expressions (grimacing) and head nod for "yes." Per documentation, pt was verbal, at times, during acute admission) Verbal Expression Overall Verbal Expression: Impaired Initiation: Impaired Automatic Speech:  (UTA; pt did not participate despite Max-Total A) Level of Generative/Spontaneous Verbalization:  (Pt was non-verbal) Repetition:  (Pt did not repeat clinician within the context of familiar/automatic words and phrases (e.g. name, greetings)) Naming:  (UTA) Pragmatics:  Unable to assess Interfering Components: Attention Written Expression Dominant Hand: Right Written Expression: Unable to assess (comment) (Pt too somnolent to participate) Oral Motor Oral Motor/Sensory Function Overall Oral Motor/Sensory Function: Moderate impairment Facial ROM: Reduced right;Suspected CN VII (facial) dysfunction Facial Symmetry: Abnormal symmetry right;Suspected CN VII (facial) dysfunction Facial Strength: Reduced right;Suspected CN VII (facial) dysfunction Facial Sensation: Reduced right Lingual ROM: Other (Comment) (UTA) Lingual Symmetry: Other (Comment) (UTA) Lingual Strength: Reduced;Suspected CN XII (hypoglossal) dysfunction Lingual Sensation: Other (Comment) (UTA) Velum: Other (comment) (UTA) Mandible: Other (Comment) (UTA - though pt was noted to exhibit open mouth posture with pooling of saliva along the R > L lateral sulci) Motor Speech Overall Motor Speech: Other (comment) (UTA - pt non-verbal) Phonation: Wet (When pt laughed subtly) Motor Planning:  (UTA)  Care Tool Care Tool Cognition Ability to hear (with hearing aid or hearing appliances if normally used Ability to hear (with hearing aid or hearing appliances if normally used): 1. Minimal difficulty - difficulty in some environments (e.g. when person speaks softly or setting is noisy)   Expression of Ideas and Wants Expression of Ideas and Wants: 1. Rarely/Never expressess or very difficult - rarely/never expresses self or speech is very difficult to understand   Understanding Verbal and Non-Verbal Content Understanding Verbal and Non-Verbal Content: 2. Sometimes understands - understands only basic conversations or simple, direct phrases. Frequently requires cues to understand  Memory/Recall Ability Memory/Recall Ability : None of the above were recalled   Bedside Swallowing Assessment General Date of Onset: 08/06/22 Previous Swallow Assessment: BSE and MBSS on 8/18 Diet Prior to this Study: NPO;NG  Tube Temperature Spikes Noted: No Respiratory Status: Room air History of Recent Intubation: No Behavior/Cognition: Lethargic/Drowsy;Doesn't follow directions (Doesn't following commands consistently and without Max-Total A) Oral Cavity - Dentition: Adequate natural dentition;Missing dentition Vision: Functional for self-feeding Patient Positioning: Upright in bed Baseline Vocal Quality: Wet Volitional Cough: Cognitively unable to elicit Volitional Swallow: Unable to elicit   Ice Chips Ice chips: Not tested (Too somnolent to participate in PO trials)  Thin Liquid Thin Liquid: Not tested (Too somnolent to participate in PO trials) Nectar Thick Nectar Thick Liquid: Not tested (Too somnolent to participate in PO trials) Honey Thick Honey Thick Liquid: Not tested (Too somnolent to participate in PO trials) Puree Puree: Not tested (Too somnolent to participate in PO trials) Solid Solid: Not tested (Too somnolent to participate in PO trials) BSE Assessment Suspected Esophageal Findings Suspected Esophageal Findings:  (N/A) Risk for Aspiration Impact on safety and function: Severe aspiration risk Other Related Risk Factors: Cognitive impairment;Lethargy;Decreased management of secretions;Deconditioning  Short Term Goals: Week 1: SLP Short Term Goal 1 (Week 1): Pt will maintain alertness for 3 minute intervals given Max A multimodal cues. SLP Short Term Goal 2 (Week 1): Following oral care, pt will participate in CSE and/or therapeutic PO trials of ice chips and tsp sips of water with minimal s/sx c/f laryngeal invasion in preparation for repeat instrumental swallow study. SLP Short Term Goal 3 (Week 1): Pt will follow contextual commands x 5 with Max A multimodal cues. SLP Short Term Goal 4 (Week 1): Pt will answer biographical yes/no questions x 3 given Max A multimodal cues. SLP Short Term Goal 5 (Week 1): Pt will participate in ongoing cognitive-linguistic assessment to further  determine ST POC and aid in d/c planning.  Refer to Care Plan for Long Term Goals  Recommendations for other services: None   Discharge Criteria: Patient will be discharged from SLP if patient refuses treatment 3 consecutive times without medical reason, if treatment goals not met, if there is a change in medical status, if patient makes no progress towards goals or if patient is discharged from hospital.  The above assessment, treatment plan, treatment alternatives and goals were discussed and mutually agreed upon: No family available/patient unable  Ariely Riddell A Tyshae Stair 08/13/2022, 9:38 AM

## 2022-08-13 NOTE — Progress Notes (Signed)
Notified on call Fayette Pho, PA),concerning if schedule CBG,is need and informed no new orders at this time,   2245 Patient repositioned and turned,with oral care provided, patient sleepy but will arouse slightly when name is called/,skin hot to touch, VS taken and recorded Temp 101.1,B/P 115/54,, HR 62, Resp.18. O2 SAT, 98%, . On call informed of changes and orders received and care out, Spouse at bedside and update provided. Marland Kitchen

## 2022-08-13 NOTE — Progress Notes (Signed)
Initial Nutrition Assessment  DOCUMENTATION CODES:   Not applicable  INTERVENTION:   Tube Feeds via NG: Increase Osmolite 1.5 to 65 mL/hr (1300 mL/day) Ok to hold up to 4 hours per day for therapy 60 mL ProSource TF20 - daily 170 mL free water flush q4h Provides 2030 kcal, 102 gm of protein, and 2011 mL total free water daily.  Continue Multivitamin w/ minerals daily  NUTRITION DIAGNOSIS:   Inadequate oral intake related to inability to eat as evidenced by NPO status.   GOAL:   Patient will meet greater than or equal to 90% of their needs  MONITOR:   PO intake, I & O's, Labs  REASON FOR ASSESSMENT:   Consult Enteral/tube feeding initiation and management, Assessment of nutrition requirement/status  ASSESSMENT:   71 y.o. male admitted to CIR after L MCA stroke s/p thrombectomy. PMH Includes dementia, CHF, HTN, and HLD.   Pt sleeping at time of RD visit. Pt did not wake to RD voice or touch. Discussed with RN, RN reports that pt has been very lethargic throughout shift. Discussed adjustment of tube feed regimen to better fit needs.   Unable to assess weight history due to lack of data.   Medications reviewed and include: B complex w/ Vitamin C, MVI, Protonix, Spironolactone Labs reviewed.   NUTRITION - FOCUSED PHYSICAL EXAM:  Deferred to follow-up.  Diet Order:   Diet Order             Diet NPO time specified  Diet effective now                   EDUCATION NEEDS:   Not appropriate for education at this time  Skin:  Skin Assessment: Reviewed RN Assessment  Last BM:  8/24 - type 7  Height:   Ht Readings from Last 1 Encounters:  08/12/22 6' (1.829 m)    Weight:   Wt Readings from Last 1 Encounters:  08/12/22 68.2 kg    Ideal Body Weight:  80.9 kg  BMI:  Body mass index is 20.39 kg/m.  Estimated Nutritional Needs:   Kcal:  1900-2100  Protein:  95-110 grams  Fluid:  >/= 1.9 L    Kirby Crigler RD, LDN Clinical Dietitian See  [facility] for contact information.

## 2022-08-13 NOTE — Evaluation (Signed)
Physical Therapy Assessment and Plan  Patient Details  Name: Dustin Blanchard MRN: [id number] Date of Birth: [birth year]  PT Diagnosis: Abnormal posture, Abnormality of gait, Cognitive deficits, Difficulty walking, Hemiparesis dominant, Impaired cognition, Impaired sensation, and Muscle spasms Rehab Potential: Poor ELOS: 2 weeks pending progress   Today's Date: 08/13/2022 PT Individual Time: 1300-1330 PT Individual Time Calculation (min): 30 min  and Today's Date: 08/13/2022 PT Missed Time: 84 Minutes Missed Time Reason: Patient fatigue   Hospital Problem: Principal Problem:   Left middle cerebral artery stroke [facility]) Active Problems:   Acute ischemic left MCA stroke [facility])   Past Medical History:  Past Medical History:  Diagnosis Date   Dementia ([place])    Past Surgical History:  Past Surgical History:  Procedure Laterality Date   HERNIA REPAIR     HIP SURGERY     IR CT HEAD LTD  08/06/2022   IR CT HEAD LTD  08/06/2022   IR PERCUTANEOUS ART THROMBECTOMY/INFUSION INTRACRANIAL INC DIAG ANGIO  08/06/2022   IR US GUIDE VASC ACCESS RIGHT  08/06/2022   RADIOLOGY WITH ANESTHESIA N/A 08/06/2022   Procedure: IR WITH ANESTHESIA;  Surgeon: Luanne Bras, MD;  Location: [place];  Service: Radiology;  Laterality: N/A;    Assessment & Plan Clinical Impression: Patient  is a 71 year old right-handed male with history of hypertension, hyperlipidemia, sinus bradycardia/chronic CHF with mitral regurgitation, BPH, history of tobacco use, vascular dementia diagnosed December 2022.  Per chart review patient lives with spouse.  1 level home 3 steps to entry.  Independent prior to admission and driving.  Presented 08/06/2022 with acute onset of right-sided weakness and aphasia.  Cranial CT scan showed small age-indeterminate left PCA infarct at the left occipital lobe.,  CT cervical spine negative.  CT angiogram head and neck positive for left MCA territory oligemia, approximately 120 mL.  No large vessel  occlusion identified on CTA, but left M3 opercular branch occlusion suspected.  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  Hospital course cardiology consulted for new A-fib RVR/CHF.  Echocardiogram showed coarse trabeculation of the apex without obvious thrombus.  Left ventricular ejection fraction 35 to 40%.  No evidence of cardiac tamponade.  Patient initially placed on Cardizem drip close monitoring of bradycardia.  He was switched to amiodarone.  Plan outpatient ischemic work-up after recovery from CVA.  Eliquis has been initiated for CVA prophylaxis as well as atrial fibrillation.  He is currently n.p.o. with alternative means of nutritional support.  Bouts of sundowning with noted history of vascular dementia maintained on Seroquel as well as Depakote.  Therapy evaluations completed due to patient decreased functional mobility right side weakness was admitted for a comprehensive rehab program. Wife at bedside can provide 24/7 supervision. Patient transferred to CIR on 08/12/2022 .   Patient currently requires total with mobility secondary to muscle weakness, decreased cardiorespiratoy endurance, unbalanced muscle activation, motor apraxia, and decreased coordination, decreased motor planning, decreased initiation, decreased attention, decreased awareness, decreased problem solving, decreased safety awareness, decreased memory, and delayed processing, and decreased sitting balance, decreased standing balance, decreased postural control, hemiplegia, and decreased balance strategies.  Prior to hospitalization, patient was modified independent  with mobility and lived with Spouse in a House home.  Home access is 3Stairs to enter.  Patient will benefit from skilled PT intervention to maximize safe functional mobility, minimize fall risk, and decrease caregiver burden for planned discharge home  with  24 hour assist.  Anticipate patient will benefit from follow up [place] at discharge.  PT - End of Session Activity Tolerance: Decreased this session Endurance Deficit: Yes Endurance Deficit Description: Too somnolent to participate at evaluation PT Assessment Rehab Potential (ACUTE/IP ONLY): Poor PT Barriers to Discharge: Incontinence;Lack of/limited family support;Insurance for SNF coverage;Weight;Behavior;Nutrition means;Home environment access/layout;Decreased caregiver support PT Patient demonstrates impairments in the following area(s): Balance;Behavior;Endurance;Motor;Nutrition;Pain;Perception;Safety;Sensory;Skin Integrity PT Transfers Functional Problem(s): Bed Mobility;Bed to Chair;Car PT Locomotion Functional Problem(s): Ambulation;Wheelchair Mobility;Stairs PT Plan PT Intensity: Minimum of 1-2 x/day ,45 to 90 minutes PT Frequency: 5 out of 7 days PT Duration Estimated Length of Stay: 2 weeks pending progress PT Treatment/Interventions: Ambulation/gait training;Discharge planning;Functional mobility training;Psychosocial support;Therapeutic Activities;Visual/perceptual remediation/compensation;Wheelchair propulsion/positioning;Therapeutic Exercise;Skin care/wound management;Neuromuscular re-education;Disease management/prevention;Balance/vestibular training;Cognitive remediation/compensation;DME/adaptive equipment instruction;Pain management;Splinting/orthotics;UE/LE Strength taining/ROM;UE/LE Coordination activities;Stair training;Patient/family education;Functional electrical stimulation;Community reintegration PT Transfers Anticipated Outcome(s): minA PT Locomotion Anticipated Outcome(s): minA PT Recommendation Follow Up Recommendations: Home health PT;24 hour supervision/assistance Patient destination: Home Equipment Recommended: To be determined   PT Evaluation Precautions/Restrictions Precautions Precautions: Fall Precaution Comments: NG tube, B wrist restraints, pelvic  restraint Restrictions Weight Bearing Restrictions: No Pain Interference Pain Interference Pain Effect on Sleep: 8. Unable to answer Pain Interference with Therapy Activities: 8. Unable to answer Pain Interference with Day-to-Day Activities: 8. Unable to answer Home Living/Prior Functioning Home Living Living Arrangements: Spouse/significant other Available Help at Discharge: Family;Available 24 hours/day Type of Home: House Home Access: Stairs to enter [employer] of Steps: 3 Entrance Stairs-Rails: None Home Layout: One level Bathroom Shower/Tub: Chiropodist: Handicapped height Bathroom Accessibility: Yes Additional Comments: Info gathered from previous documentation. Pt unable to answer questions due to lethargy  Lives With: Spouse Vision/Perception  Vision - History Ability to See in Adequate Light:  (UTA) Perception Perception: Impaired Praxis Praxis: Impaired Praxis Impairment Details: Initiation;Motor planning;Ideomotor  Cognition Overall Cognitive Status: No family/caregiver present to determine baseline cognitive functioning Arousal/Alertness: Lethargic Orientation Level: Other (comment) (UTA 2/2 lethargy) Attention: Focused Focused Attention: Impaired Focused Attention Impairment: Verbal basic;Functional basic Awareness: Impaired Awareness Impairment: Intellectual impairment Problem Solving: Impaired Problem Solving Impairment: Verbal basic;Functional basic Safety/Judgment: Impaired (B wrist restrains and pelvic restraints) Sensation Sensation Light Touch: Not tested Hot/Cold: Not tested Proprioception: Not tested Stereognosis: Not tested Coordination Gross Motor Movements are Fluid and Coordinated: Not tested Fine Motor Movements are Fluid and Coordinated: Not tested Motor  Motor Motor: Other (comment) Motor - Skilled Clinical Observations: UTA at eval due to somnolence and lethargy   Trunk/Postural Assessment  Cervical  Assessment Cervical Assessment: Exceptions to [facility] (collapsed forward flexion sitting EOB, uable to keep head in neutral with no cervical extension) Thoracic Assessment Thoracic Assessment: Exceptions to [facility] Lumbar Assessment Lumbar Assessment: Exceptions to [facility] Postural Control Postural Control: Deficits on evaluation Head Control: insufficient Trunk Control: inadequate Righting Reactions: no initiation Protective Responses: no initiation  Balance Balance Balance Assessed: Yes Static Sitting Balance Static Sitting - Balance Support: Feet supported;No upper extremity supported Static Sitting - Level of Assistance: 1: +2 Total assist Dynamic Sitting Balance Dynamic Sitting - Balance Support: Feet unsupported Dynamic Sitting - Level of Assistance: 1: +2 Total assist Extremity Assessment      RLE Assessment RLE Assessment: Exceptions to [facility] General Strength Comments: Unable to assess due to somnolence LLE Assessment LLE Assessment: Exceptions to [facility] General Strength Comments: Unable to assess due to somnolence  Care Tool Care Tool Bed Mobility Roll left and right activity   Roll left and right assist level: 2 Helpers  Sit to lying activity   Sit to lying assist level: 2 Helpers    Lying to sitting on side of bed activity   Lying to sitting on side of bed assist level: the ability to move from lying on the back to sitting on the side of the bed with no back support.: 2 Helpers     Care Tool Transfers Sit to stand transfer Sit to stand activity did not occur: Safety/medical concerns      Chair/bed transfer Chair/bed transfer activity did not occur: Safety/medical concerns       Toilet transfer Toilet transfer activity did not occur: Safety/medical concerns      Scientist, product/process development transfer activity did not occur: Safety/medical concerns        Care Tool Locomotion Ambulation Ambulation activity did not occur: Safety/medical concerns        Walk 10 feet activity  Walk 10 feet activity did not occur: Safety/medical concerns       Walk 50 feet with 2 turns activity Walk 50 feet with 2 turns activity did not occur: Safety/medical concerns      Walk 150 feet activity Walk 150 feet activity did not occur: Safety/medical concerns      Walk 10 feet on uneven surfaces activity Walk 10 feet on uneven surfaces activity did not occur: Safety/medical concerns      Stairs Stair activity did not occur: Safety/medical concerns        Walk up/down 1 step activity Walk up/down 1 step or curb (drop down) activity did not occur: Safety/medical concerns      Walk up/down 4 steps activity Walk up/down 4 steps activity did not occur: Safety/medical concerns      Walk up/down 12 steps activity Walk up/down 12 steps activity did not occur: Safety/medical concerns      Pick up small objects from floor Pick up small object from the floor (from standing position) activity did not occur: Safety/medical concerns      Wheelchair Is the patient using a wheelchair?: Yes Type of Wheelchair: Manual Wheelchair activity did not occur: Safety/medical concerns      Wheel 50 feet with 2 turns activity Wheelchair 50 feet with 2 turns activity did not occur: Safety/medical concerns    Wheel 150 feet activity Wheelchair 150 feet activity did not occur: Safety/medical concerns      Refer to Care Plan for Long Term Goals  SHORT TERM GOAL WEEK 1 PT Short Term Goal 1 (Week 1): Pt will complete bed mobility with maxA PT Short Term Goal 2 (Week 1): Pt will complete bed<>chair transfers with maxA and LRAD PT Short Term Goal 3 (Week 1): Pt will ambulate 81f with maxA and LRAD PT Short Term Goal 4 (Week 1): Pt will initiate stair training  Recommendations for other services: None   Skilled Therapeutic Intervention Mobility Bed Mobility Bed Mobility: Rolling Right;Rolling Left;Supine to Sit;Sit to Supine;Sitting - Scoot to [place] Bed Rolling Right: 2 Helpers Rolling  Left: 2 Helpers Supine to Sit: 2 Helpers Sitting - Scoot to [place] Bed: 2 Helpers Sit to Supine: 2 Helpers Transfers Transfers:  (unsafe to attempt due to somnolence) Locomotion  Gait Ambulation: No Gait Gait: No Stairs / Additional Locomotion Stairs: No Wheelchair Mobility Wheelchair Mobility: No  Skilled Intervention: Pt presenting in bed sleeping soundly and unable to arouse with max multi cues, including sternal rub, pain response, or loud noises. BP taken reading 101/62. Pt did not open eyes throughout session and showed no  signs of communication or arousal. With +2 assist from NT, pt completed supine<>sitting EOB. Once EOB, arousal did not improve with his head collapsing forward with no effort to activate cervical extension for neutral posture. Absent righting and protective responses, no functional or spontaneous movement observed. Pt returned to supine position for safety concerns as pt was unable to be alert enough for OOB mobilization on evaluation. Repositioned with +2 assist, re-applied B wrist restraints and pelvic restraint. HOB raised, tube feeds resumed. He missed 45 minutes of PT assessment/treatment due to lethargy.   Discharge Criteria: Patient will be discharged from PT if patient refuses treatment 3 consecutive times without medical reason, if treatment goals not met, if there is a change in medical status, if patient makes no progress towards goals or if patient is discharged from hospital.  The above assessment, treatment plan, treatment alternatives and goals were discussed and mutually agreed upon: No family available/patient unable  [place] PT 08/13/2022, 1:30 PM

## 2022-08-13 NOTE — Progress Notes (Signed)
Called provider to discuss plan of care after X2 attempted failure to pass NG tube as patient was extremely agitated and uncooperative. Orders obtained for Ativan 1 mg IV to help settle patient. NG tube finally inserted with the patient seated in the upright position and much calmer this time (assisted by 2 other staff members). CXR obtained to confirm placement and all due medications given. Patient remains in bed with HOB elevation greater than 30 degrees. Mouth suctioned as needed of Oral secretions.

## 2022-08-13 NOTE — Progress Notes (Signed)
Inpatient Rehabilitation Care Coordinator Assessment and Plan Patient Details  Name: Muhammad Vacca MRN: [id number] Date of Birth: [birth year]  Today's Date: 08/13/2022  Hospital Problems: Principal Problem:   Left middle cerebral artery stroke [facility]) Active Problems:   Acute ischemic left MCA stroke [facility])  Past Medical History:  Past Medical History:  Diagnosis Date   Dementia [facility])    Past Surgical History:  Past Surgical History:  Procedure Laterality Date   HERNIA REPAIR     HIP SURGERY     IR CT HEAD LTD  08/06/2022   IR CT HEAD LTD  08/06/2022   IR PERCUTANEOUS ART THROMBECTOMY/INFUSION INTRACRANIAL INC DIAG ANGIO  08/06/2022   IR US GUIDE VASC ACCESS RIGHT  08/06/2022   RADIOLOGY WITH ANESTHESIA N/A 08/06/2022   Procedure: IR WITH ANESTHESIA;  Surgeon: Julieanne Cotton, MD;  Location: [facility] OR;  Service: Radiology;  Laterality: N/A;   Social History:  reports that he has quit smoking. He has never used smokeless tobacco. He reports that he does not currently use drugs. He reports that he does not drink alcohol.  Family / Support Systems Marital Status: Married Patient Roles: Spouse, Parent Spouse/Significant Other: Rinaldo Cloud [phone]  [phone] Children: Phillip-son [phone] lives next door Other Supports: Two other children-daughter and son in [place] Anticipated Caregiver: Wife Ability/Limitations of Caregiver: 24/7 wife is in good health Caregiver Availability: 24/7 Family Dynamics: Close with son who lives next door and their other two children. Their daughter is a RT and will assist with home set up. They have neighbors and church members who will check on but main person is his wife  Social History Preferred language: English Religion: Unknown Cultural Background: No issues Education: HS Health Literacy - How often do you need to have someone help you when you read instructions, pamphlets, or other written material from your doctor or pharmacy?: Patient unable  to respond Writes: Yes Employment Status: Retired Marine scientist Issues: No issues Guardian/Conservator: None-according to MD pt is not fully capable of making his own decisions wife would like to pursue guardianship due to dementia diagnosis from 11/2021 and having issues at home prior to admission   Abuse/Neglect Abuse/Neglect Assessment Can Be Completed: Unable to assess, patient is non-responsive or altered mental status  Patient response to: Social Isolation - How often do you feel lonely or isolated from those around you?: Patient unable to respond  Emotional Status Pt's affect, behavior and adjustment status: Obtained information from wife for assessment due to pt unable to. She reports he was independent and did yard work and at [employer] would drive down the street. he was having memory issues and needed supervision prior to admission. He was mobile and she hoppes he can get back to this along with his swallowing. Recent Psychosocial Issues: other health issues-was diagnosed with vascular dementia 11/2021 Psychiatric History: Diagnosed with vascular dementia 11/2021 had no other issues Substance Abuse History: No issues  Patient / Family Perceptions, Expectations & Goals Pt/Family understanding of illness & functional limitations: Wife has spoken with the MD and feels she has a good understanding of his treatment plan. her hhope is his swallowing will get better and he will be able to eat and he becomes more mobile. Premorbid pt/family roles/activities: Husband, father, retiree, church member,neighbor Anticipated changes in roles/activities/participation: resume Pt/family expectations/goals: Wife states: " I hope he can get his swallowing back and he can move better, so I can manage him."  [employer]: None Premorbid Home Care/DME Agencies: Other (Comment) (  cane, bsc, tub seat) Transportation available at discharge: wife Is the patient able to  respond to transportation needs?: Yes In the past 12 months, has lack of transportation kept you from medical appointments or from getting medications?: No In the past 12 months, has lack of transportation kept you from meetings, work, or from getting things needed for daily living?: No  Discharge Planning Living Arrangements: Spouse/significant other Support Systems: Spouse/significant other, Children, Friends/neighbors, Church/faith community Type of Residence: Private residence Insurance Resources: Media planner (specify) Administrator Medicare) Financial Resources: Social Security, Family Support Financial Screen Referred: No Living Expenses: Own Money Management: Spouse Does the patient have any problems obtaining your medications?: No Home Management: wife, pt did help with yardwork Patient/Family Preliminary Plans: Return home with wife providing assist, she is in good health and was providing supervision prior to admission due to pt's dementia. Their son lives next door so is close by to assist if needed, but he does work. Aware being evaluated and goals being set for discharge. Care Coordinator Barriers to Discharge: Insurance for SNF coverage Care Coordinator Anticipated Follow Up Needs: HH/OP  Clinical Impression Wife providing information for assessment due to pt lethargic and not able to answer the questions. She reported pt was mobile prior to admission but had good and bad days with his cognition. She will plan to work on guardianship of him since no POA can be down now. Their children are involved only one is local. Will await therapy evaluations and work on discharge needs.  Lucy Chris 08/13/2022, 1:04 PM

## 2022-08-13 NOTE — Evaluation (Signed)
Occupational Therapy Assessment and Plan  Patient Details  Name: Dustin Blanchard MRN: [id number] Date of Birth: 1951/02/24  OT Diagnosis: abnormal posture, cognitive deficits, hemiplegia affecting dominant side, and muscle weakness (generalized) Rehab Potential: Rehab Potential (ACUTE ONLY): Poor ELOS: 14 days pending progress   Today's Date: 08/13/2022 OT Individual Time: 1015-1130 75 min        Hospital Problem: Principal Problem:   Left middle cerebral artery stroke [facility]) Active Problems:   Acute ischemic left MCA stroke [facility])   Past Medical History:  Past Medical History:  Diagnosis Date   Dementia ([place])    Past Surgical History:  Past Surgical History:  Procedure Laterality Date   HERNIA REPAIR     HIP SURGERY     IR CT HEAD LTD  08/06/2022   IR CT HEAD LTD  08/06/2022   IR PERCUTANEOUS ART THROMBECTOMY/INFUSION INTRACRANIAL INC DIAG ANGIO  08/06/2022   IR US GUIDE VASC ACCESS RIGHT  08/06/2022   RADIOLOGY WITH ANESTHESIA N/A 08/06/2022   Procedure: IR WITH ANESTHESIA;  Surgeon: Luanne Bras, MD;  Location: [place];  Service: Radiology;  Laterality: N/A;    Assessment & Plan Clinical Impression: Patient is a 71 y.o. year old R handed male w/ hx HTN, hyperlipidemia, sinus bradycardia/chronic CHF with mitral regurtion, BPH, history of tobacco use, vascular dementia dx 12/22. Pt lives with spouse.1 level home 3 steps to entry.  I pta & driving [facility] 5/63/[id number] with acute onset of right-sided weakness and aphasia.  Cranial CT scan- small age-indeterminate left PCA infarct at the left occipital lobe CT cervical spine  CT agram head and neck positive for left MCA territory oligemia, approximately 120 mL. left M3 opercular branch occlusion suspected.  Patient did receive TNK as well as endovascular revascularization of occlusion of the left M3 MCA middle div branch 08/06/2022 per IVR.  Hospital course cardiology consulted for new A-fib RVR/CHF.  Echo showed coarse trabeculation of the  apex without obvious thrombus.  Left ventricular ejection fraction 35 to 40% Plan outpatient ischemic work-up after recovery from CVA.  Eliquis for CVA prophylaxis as well as afib.  Currently n.p.o. with alternative means of nutritional support.  Bouts of sundowning with noted hx of vasc dementia maintained on Seroquel as well as Depakote. Admitted to CIR 8/23.   Patient currently requires total with basic self-care skills and IADL secondary to muscle weakness, decreased cardiorespiratoy endurance, abnormal tone, unbalanced muscle activation, decreased coordination, and decreased motor planning, decreased visual perceptual skills and decreased visual motor skills, decreased midline orientation, decreased attention to left, decreased attention to right, and decreased motor planning, decreased initiation, decreased attention, decreased awareness, decreased problem solving, decreased safety awareness, decreased memory, and delayed processing, and decreased sitting balance, decreased standing balance, decreased postural control, hemiplegia, and decreased balance strategies.  Prior to hospitalization, patient could complete BADL's and some driving with modified independent .  Patient will benefit from skilled intervention to decrease level of assist with basic self-care skills, increase independence with basic self-care skills, and increase level of independence with iADL prior to discharge home with care partner.  Anticipate patient will require 24 hour supervision and moderate physical assestance and follow up home health.  OT - End of Session Activity Tolerance: Decreased this session Endurance Deficit: Yes Endurance Deficit Description: Too somnolent to participate at evaluation OT Assessment Rehab Potential (ACUTE ONLY): Poor OT Barriers to Discharge: Incontinence;Behavior;Nutrition means;Other (comments) (poor LOA this eval wiht earlier behavioral/safety issues) OT Barriers to Discharge Comments:  NPO/NG tube. poor  LOA and perticipation, significant physical assist for all care OT Patient demonstrates impairments in the following area(s): Balance;Behavior;Cognition;Endurance;Motor;Nutrition;Vision;Sensory;Safety;Perception OT Basic ADL's Functional Problem(s): Eating;Grooming;Bathing;Dressing;Toileting OT Transfers Functional Problem(s): Toilet;Tub/Shower OT Additional Impairment(s): Fuctional Use of Upper Extremity OT Plan OT Intensity: Minimum of 1-2 x/day, 45 to 90 minutes OT Frequency: 5 out of 7 days OT Duration/Estimated Length of Stay: 14 days pending progress OT Treatment/Interventions: Balance/vestibular training;Community reintegration;Disease mangement/prevention;Functional electrical stimulation;Neuromuscular re-education;Patient/family education;Self Care/advanced ADL retraining;Splinting/orthotics;Therapeutic Exercise;UE/LE Coordination activities;Wheelchair propulsion/positioning;Cognitive remediation/compensation;Discharge planning;DME/adaptive equipment instruction;Functional mobility training;Psychosocial support;Therapeutic Activities;UE/LE Strength taining/ROM;Visual/perceptual remediation/compensation OT Self Feeding Anticipated Outcome(s): NPO thus TBA OT Basic Self-Care Anticipated Outcome(s): mod A OT Toileting Anticipated Outcome(s): mod A OT Bathroom Transfers Anticipated Outcome(s): mod A OT Recommendation Patient destination: Home Follow Up Recommendations: Home health OT;24 hour supervision/assistance Equipment Recommended: Wheelchair cushion (measurements);Wheelchair (measurements);To be determined Equipment Details: most likely hospital bed, w/c if d/cing home vs SNF   OT Evaluation Precautions/Restrictions  Precautions Precautions: Fall Precaution Comments: NG tube, B wrist restraints, pelvic restraint Restrictions Weight Bearing Restrictions: No General Chart Reviewed: Yes Family/Caregiver Present: No Vital Signs Therapy Vitals Temp: 99.4 F  (37.4 C) Pulse Rate: 60 Resp: 17 BP: 131/65 Patient Position (if appropriate): Lying Oxygen Therapy SpO2: 100 % O2 Device: Room Air Pain Pain Assessment Pain Scale: 0-10 Pain Score: 0-No pain Multiple Pain Sites: No PAINAD (Pain Assessment in Advanced Dementia) Breathing: normal Home Living/Prior Functioning Home Living Family/patient expects to be discharged to:: Private residence Living Arrangements: Spouse/significant other Available Help at Discharge: Family, Available 24 hours/day Type of Home: House Home Access: Stairs to enter [employer] of Steps: 3 Entrance Stairs-Rails: None Home Layout: One level Bathroom Shower/Tub: Chiropodist: Handicapped height Bathroom Accessibility: Yes Additional Comments: Info gathered from previous documentation. Pt unable to answer questions due to lethargy  Lives With: Spouse IADL History Homemaking Responsibilities: Yes Current License: Yes Mode of TransportationOccupational psychologist Education: pt unable to provide information Type of Occupation: tbd IADL Comments: was driving Prior Function Level of Independence: Independent with basic ADLs, Independent with gait  Able to Take Stairs?: Yes Driving: Yes Vocation: Retired Surveyor, mining Baseline Vision/History: 1 Wears glasses Ability to See in Adequate Light:  (unable to assess due to eyes closed most of session) Vision Assessment?: Vision impaired- to be further tested in functional context Additional Comments: Wehn eyes open briefly- tendency toward L gaze but poor head control this session and poor LOA Perception  Perception: Impaired Inattention/Neglect: Does not attend to right visual field;Impaired-to be further tested in functional context Praxis Praxis: Impaired Praxis Impairment Details: Initiation;Motor planning;Ideomotor Cognition Cognition Overall Cognitive Status: No family/caregiver present to determine baseline cognitive  functioning Arousal/Alertness: Lethargic Attention: Focused Focused Attention: Impaired Focused Attention Impairment: Verbal basic;Functional basic Awareness: Impaired Awareness Impairment: Intellectual impairment Problem Solving: Impaired Problem Solving Impairment: Verbal basic;Functional basic Behaviors: Restless Safety/Judgment: Impaired Brief Interview for Mental Status (BIMS) Repetition of Three Words (First Attempt): No answer Temporal Orientation: Year: No answer Temporal Orientation: Month: No answer Temporal Orientation: Day: No answer Recall: "Sock": No answer Recall: "Blue": No answer Recall: "Bed": No answer BIMS Summary Score: 99 Sensation Sensation Light Touch: Not tested Hot/Cold: Not tested Proprioception: Not tested Stereognosis: Not tested Coordination Gross Motor Movements are Fluid and Coordinated: No Fine Motor Movements are Fluid and Coordinated: No Coordination and Movement Description: severe forward head, poor trunk control and no UE initiation noted this session Motor  Motor Motor: Other (comment) Motor - Skilled Clinical Observations: UTA at eval due to somnolence and lethargy  Trunk/Postural Assessment  Cervical Assessment Cervical Assessment: Exceptions to [facility] Thoracic Assessment Thoracic Assessment: Exceptions to [facility] Lumbar Assessment Lumbar Assessment: Exceptions to [facility] Postural Control Postural Control: Deficits on evaluation Head Control: insufficient Trunk Control: inadequate Righting Reactions: no initiation Protective Responses: no initiation  Balance Balance Balance Assessed: Yes Static Sitting Balance Static Sitting - Balance Support: Feet supported;No upper extremity supported Static Sitting - Level of Assistance: 1: +2 Total assist Dynamic Sitting Balance Dynamic Sitting - Balance Support: Feet unsupported Dynamic Sitting - Level of Assistance: 1: +2 Total assist Extremity/Trunk Assessment RUE Assessment RUE  Assessment: Exceptions to [facility] LUE Assessment LUE Assessment: Exceptions to [facility]  Care Tool Care Tool Self Care Eating Eating activity did not occur: Safety/medical concerns      Oral Care    Oral Care Assist Level: Total assistance - Patient < 25%    Bathing   Body parts bathed by patient: Face Body parts bathed by helper: Right arm;Left arm;Chest;Abdomen;Front perineal area;Buttocks;Left lower leg;Right upper leg;Right lower leg;Left upper leg   Assist Level: Dependent - Patient 0%    Upper Body Dressing(including orthotics)   What is the patient wearing?: Hospital gown only   Assist Level: Dependent - Patient 0%    Lower Body Dressing (excluding footwear)   What is the patient wearing?: Hospital gown only Assist for lower body dressing: Dependent - Patient 0%    Putting on/Taking off footwear   What is the patient wearing?: Non-skid slipper socks Assist for footwear: Dependent - Patient 0%       Care Tool Toileting Toileting activity   Assist for toileting: Dependent - Patient 0%     Care Tool Bed Mobility Roll left and right activity   Roll left and right assist level: 2 Helpers    Sit to lying activity Sit to lying activity did not occur: Safety/medical concerns Sit to lying assist level: 2 Helpers    Lying to sitting on side of bed activity Lying to sitting on side of bed activity did not occur: the ability to move from lying on the back to sitting on the side of the bed with no back support.: Safety/medical concerns Lying to sitting on side of bed assist level: the ability to move from lying on the back to sitting on the side of the bed with no back support.: 2 Helpers     Care Tool Transfers Sit to stand transfer Sit to stand activity did not occur: Safety/medical concerns      Chair/bed transfer Chair/bed transfer activity did not occur: Safety/medical concerns       Toilet transfer Toilet transfer activity did not occur: Safety/medical concerns        Care Tool Cognition  Expression of Ideas and Wants Expression of Ideas and Wants: 1. Rarely/Never expressess or very difficult - rarely/never expresses self or speech is very difficult to understand  Understanding Verbal and Non-Verbal Content Understanding Verbal and Non-Verbal Content: 2. Sometimes understands - understands only basic conversations or simple, direct phrases. Frequently requires cues to understand   Memory/Recall Ability Memory/Recall Ability : None of the above were recalled   Refer to Care Plan for Long Term Goals  SHORT TERM GOAL WEEK 1 OT Short Term Goal 1 (Week 1): Pt will sit EOB with mod a and cues for simple ADL OT Short Term Goal 2 (Week 1): Pt will bathe UB with max a and mod cues OT Short Term Goal 3 (Week 1): Pt will complete simple grooming task with mod  cues with 1/3 step initiation OT Short Term Goal 4 (Week 1): Pt will don pull over shirt wiht max A and mod cues using hemi techniques  Recommendations for other services: Neuropsych   Skilled Therapeutic Intervention ADL ADL Eating: NPO Grooming: Maximal assistance Where Assessed-Grooming: Edge of bed;Bed level Upper Body Bathing: Dependent Lower Body Bathing: Dependent Where Assessed-Lower Body Bathing: Bed level Upper Body Dressing: Dependent Where Assessed-Upper Body Dressing: Bed level Lower Body Dressing: Dependent Where Assessed-Lower Body Dressing: Bed level Toileting: Dependent Where Assessed-Toileting: Bed level Toilet Transfer: Unable to assess Toilet Transfer Method: Unable to assess Tub/Shower Transfer: Unable to assess Social research officer, government: Unable to assess ADL Comments: SN reports pt had been agitated in the night when NG tube being place and was given sedative meds and restraints placed to avoid pt pulling at new line. Pt with poor LOA and particiaption this session and day. OT worked with NT to position pt EOB to obtain increased stimulation and movmeent to elicit maximal  response. Pt remained asleep, eyes closed except for minimal face washing, suction oral care and when bathing performed at buttocks and peri region- no skin breakdown noted.Poor visual regard and tracking despite sun filled room, music playing and max visual stimulai presented.  Significant forward head, kyphotic posture and R lean needing max support. TIS w/c with [employer] cushion provided but unable to participate in OOB this sesison due to safety concerns. Mobility  Bed Mobility Bed Mobility: Rolling Right;Rolling Left;Supine to Sit;Sit to Supine;Sitting - Scoot to [employer] of Bed Rolling Right: 2 Helpers Rolling Left: 2 Helpers Supine to Sit: 2 Helpers Sitting - Scoot to [employer] of Bed: 2 Helpers Sit to Supine: 2 Helpers  Treatment/Interventions: SN reports pt had been agitated in the night when NG tube being place and was given sedative meds and restraints placed to avoid pt pulling at new line. Pt with poor LOA and particiaption this session and day. OT worked with NT to position pt EOB to obtain increased stimulation and movmeent to elicit maximal response. Pt remained asleep, eyes closed except for minimal face washing, suction oral care and when bathing performed at buttocks and peri region- no skin breakdown noted.Poor visual regard and tracking despite sun filled room, music playing and max visual stimulai presented.  Significant forward head, kyphotic posture and R lean needing max support. TIS w/c with [employer] cushion provided but unable to participate in OOB this sesison due to safety concerns. Replaced wrist and abdominal restraints, place nurse call button in reach and activated bed alarm.   Discharge Criteria: Patient will be discharged from OT if patient refuses treatment 3 consecutive times without medical reason, if treatment goals not met, if there is a change in medical status, if patient makes no progress towards goals or if patient is discharged from hospital.  The above assessment,  treatment plan, treatment alternatives and goals were discussed and mutually agreed upon: No family available/patient unable  Barnabas Lister 08/13/2022, 10:21 PM

## 2022-08-13 NOTE — Plan of Care (Signed)
  Problem: RH Balance Goal: LTG Patient will maintain dynamic sitting balance (PT) Description: LTG:  Patient will maintain dynamic sitting balance with assistance during mobility activities (PT) Flowsheets (Taken 08/13/2022 1322) LTG: Pt will maintain dynamic sitting balance during mobility activities with:: Minimal Assistance - Patient > 75% Goal: LTG Patient will maintain dynamic standing balance (PT) Description: LTG:  Patient will maintain dynamic standing balance with assistance during mobility activities (PT) Flowsheets (Taken 08/13/2022 1322) LTG: Pt will maintain dynamic standing balance during mobility activities with:: Minimal Assistance - Patient > 75%   Problem: Sit to Stand Goal: LTG:  Patient will perform sit to stand with assistance level (PT) Description: LTG:  Patient will perform sit to stand with assistance level (PT) Flowsheets (Taken 08/13/2022 1322) LTG: PT will perform sit to stand in preparation for functional mobility with assistance level: Minimal Assistance - Patient > 75%   Problem: RH Bed Mobility Goal: LTG Patient will perform bed mobility with assist (PT) Description: LTG: Patient will perform bed mobility with assistance, with/without cues (PT). Flowsheets (Taken 08/13/2022 1322) LTG: Pt will perform bed mobility with assistance level of: Minimal Assistance - Patient > 75%   Problem: RH Bed to Chair Transfers Goal: LTG Patient will perform bed/chair transfers w/assist (PT) Description: LTG: Patient will perform bed to chair transfers with assistance (PT). Flowsheets (Taken 08/13/2022 1322) LTG: Pt will perform Bed to Chair Transfers with assistance level: Minimal Assistance - Patient > 75%   Problem: RH Car Transfers Goal: LTG Patient will perform car transfers with assist (PT) Description: LTG: Patient will perform car transfers with assistance (PT). Flowsheets (Taken 08/13/2022 1322) LTG: Pt will perform car transfers with assist:: Minimal Assistance -  Patient > 75%   Problem: RH Ambulation Goal: LTG Patient will ambulate in controlled environment (PT) Description: LTG: Patient will ambulate in a controlled environment, # of feet with assistance (PT). Flowsheets (Taken 08/13/2022 1322) LTG: Pt will ambulate in controlled environ  assist needed:: Minimal Assistance - Patient > 75% LTG: Ambulation distance in controlled environment: 122ft Goal: LTG Patient will ambulate in home environment (PT) Description: LTG: Patient will ambulate in home environment, # of feet with assistance (PT). Flowsheets (Taken 08/13/2022 1322) LTG: Pt will ambulate in home environ  assist needed:: Minimal Assistance - Patient > 75% LTG: Ambulation distance in home environment: 35ft   Problem: RH Stairs Goal: LTG Patient will ambulate up and down stairs w/assist (PT) Description: LTG: Patient will ambulate up and down # of stairs with assistance (PT) Flowsheets (Taken 08/13/2022 1322) LTG: Pt will ambulate up/down stairs assist needed:: Minimal Assistance - Patient > 75% LTG: Pt will  ambulate up and down number of stairs: 4

## 2022-08-13 NOTE — Plan of Care (Signed)
  Problem: RH Swallowing Goal: LTG Patient will participate in dysphagia therapy to increase swallow function with assistance (SLP) Description: LTG:  Patient will participate in dysphagia therapy to increase swallow function with assistance (SLP) Flowsheets (Taken 08/13/2022 0939) LTG: Pt will participate in dysphagia therapy to increase swallow function with assistance of (SLP): Maximal Assistance - Patient 25 - 49% Goal: LTG Pt will demonstrate functional change in swallow as evidenced by bedside/clinical objective assessment (SLP) Description: LTG: Patient will demonstrate functional change in swallow as evidenced by bedside/clinical objective assessment (SLP) Flowsheets (Taken 08/13/2022 0939) LTG: Patient will demonstrate functional change in swallow as evidenced by bedside/clinical objective assessment: Oropharyngeal swallow   Problem: RH Comprehension Communication Goal: LTG Patient will comprehend basic/complex auditory (SLP) Description: LTG: Patient will comprehend basic/complex auditory information with cues (SLP). Flowsheets (Taken 08/13/2022 [phone]) LTG: Patient will comprehend: Basic auditory information LTG: Patient will comprehend auditory information with cueing (SLP): Moderate Assistance - Patient 50 - 74%   Problem: RH Expression Communication Goal: LTG Patient will express needs/wants via multi-modal(SLP) Description: LTG:  Patient will express needs/wants via multi-modal communication (gestures/written, etc) with cues (SLP) Flowsheets (Taken 08/13/2022 0939) LTG: Patient will express needs/wants via multimodal communication (gestures/written, etc) with cueing (SLP): Maximal Assistance - Patient 25 - 49% Goal: LTG Patient will verbally express basic/complex needs(SLP) Description: LTG:  Patient will verbally express basic/complex needs, wants or ideas with cues  (SLP) Flowsheets (Taken 08/13/2022 0939) LTG: Patient will verbally express basic/complex needs, wants or ideas  (SLP): Maximal Assistance - Patient 25 - 49%   Problem: RH Attention Goal: LTG Patient will demonstrate this level of attention during functional activites (SLP) Description: LTG:  Patient will will demonstrate this level of attention during functional activites (SLP) Flowsheets (Taken 08/13/2022 [phone]) Patient will demonstrate during cognitive/linguistic activities the attention type of: Focused Patient will demonstrate this level of attention during cognitive/linguistic activities in: Controlled LTG: Patient will demonstrate this level of attention during cognitive/linguistic activities with assistance of (SLP): Moderate Assistance - Patient 50 - 74% Number of minutes patient will demonstrate attention during cognitive/linguistic activities: 10 minutes

## 2022-08-13 NOTE — Plan of Care (Signed)
Problem: RH Balance Goal: LTG: Patient will maintain dynamic sitting balance (OT) Description: LTG:  Patient will maintain dynamic sitting balance with assistance during activities of daily living (OT) Flowsheets (Taken 08/13/2022 1110) LTG: Pt will maintain dynamic sitting balance during ADLs with: Contact Guard/Touching assist   Problem: Sit to Stand Goal: LTG:  Patient will perform sit to stand in prep for activites of daily living with assistance level (OT) Description: LTG:  Patient will perform sit to stand in prep for activites of daily living with assistance level (OT) Flowsheets (Taken 08/13/2022 1110) LTG: PT will perform sit to stand in prep for activites of daily living with assistance level: Minimal Assistance - Patient > 75%   Problem: RH Eating Goal: LTG Patient will perform eating w/assist, cues/equip (OT) Description: LTG: Patient will perform eating with assist, with/without cues using equipment (OT) Flowsheets (Taken 08/13/2022 1110) LTG: Pt will perform eating with assistance level of: Minimal Assistance - Patient > 75% Note: When and if progressed off NPO and diet upgraded    Problem: RH Grooming Goal: LTG Patient will perform grooming w/assist,cues/equip (OT) Description: LTG: Patient will perform grooming with assist, with/without cues using equipment (OT) Flowsheets (Taken 08/13/2022 1110) LTG: Pt will perform grooming with assistance level of: Minimal Assistance - Patient > 75%   Problem: RH Bathing Goal: LTG Patient will bathe all body parts with assist levels (OT) Description: LTG: Patient will bathe all body parts with assist levels (OT) Flowsheets (Taken 08/13/2022 1110) LTG: Pt will perform bathing with assistance level/cueing: Moderate Assistance - Patient 50 - 74%   Problem: RH Dressing Goal: LTG Patient will perform upper body dressing (OT) Description: LTG Patient will perform upper body dressing with assist, with/without cues (OT). Flowsheets (Taken  08/13/2022 1110) LTG: Pt will perform upper body dressing with assistance level of: Minimal Assistance - Patient > 75% Goal: LTG Patient will perform lower body dressing w/assist (OT) Description: LTG: Patient will perform lower body dressing with assist, with/without cues in positioning using equipment (OT) Flowsheets (Taken 08/13/2022 1110) LTG: Pt will perform lower body dressing with assistance level of: Moderate Assistance - Patient 50 - 74%   Problem: RH Functional Use of Upper Extremity Goal: LTG Patient will use RT/LT upper extremity as a (OT) Description: LTG: Patient will use right/left upper extremity as a stabilizer/gross assist/diminished/nondominant/dominant level with assist, with/without cues during functional activity (OT) Flowsheets (Taken 08/13/2022 1110) LTG: Use of upper extremity in functional activities:  RUE as diminished level  LUE as diminished level LTG: Pt will use upper extremity in functional activity with assistance level of: Minimal Assistance - Patient > 75%   Problem: RH Toilet Transfers Goal: LTG Patient will perform toilet transfers w/assist (OT) Description: LTG: Patient will perform toilet transfers with assist, with/without cues using equipment (OT) Flowsheets (Taken 08/13/2022 1110) LTG: Pt will perform toilet transfers with assistance level of: Minimal Assistance - Patient > 75%   Problem: RH Tub/Shower Transfers Goal: LTG Patient will perform tub/shower transfers w/assist (OT) Description: LTG: Patient will perform tub/shower transfers with assist, with/without cues using equipment (OT) Flowsheets (Taken 08/13/2022 1110) LTG: Pt will perform tub/shower stall transfers with assistance level of: Moderate Assistance - Patient 50 - 74%   Problem: RH Attention Goal: LTG Patient will demonstrate this level of attention during functional activites (OT) Description: LTG:  Patient will demonstrate this level of attention during functional activites   (OT) Flowsheets (Taken 08/13/2022 1110) Patient will demonstrate this level of attention during functional activites: Focused LTG: Patient  will demonstrate this level of attention during functional activites (OT): Minimal Assistance - Patient > 75%   Problem: RH Awareness Goal: LTG: Patient will demonstrate awareness during functional activites type of (OT) Description: LTG: Patient will demonstrate awareness during functional activites type of (OT) Flowsheets (Taken 08/13/2022 1110) Patient will demonstrate awareness during functional activites type of:  Emergent  Anticipatory LTG: Patient will demonstrate awareness during functional activites type of (OT): Minimal Assistance - Patient > 75%

## 2022-08-14 ENCOUNTER — Inpatient Hospital Stay (HOSPITAL_COMMUNITY): Payer: Medicare HMO

## 2022-08-14 DIAGNOSIS — I63512 Cerebral infarction due to unspecified occlusion or stenosis of left middle cerebral artery: Secondary | ICD-10-CM | POA: Diagnosis not present

## 2022-08-14 LAB — URINALYSIS, ROUTINE W REFLEX MICROSCOPIC
Bacteria, UA: NONE SEEN
Bilirubin Urine: NEGATIVE
Glucose, UA: NEGATIVE mg/dL
Ketones, ur: NEGATIVE mg/dL
Nitrite: NEGATIVE
Protein, ur: 100 mg/dL — AB
RBC / HPF: >50 RBC/hpf — ABNORMAL HIGH (ref 0–5)
Specific Gravity, Urine: 1.021 (ref 1.005–1.030)
WBC, UA: >50 WBC/hpf — ABNORMAL HIGH (ref 0–5)
pH: 7 (ref 5.0–8.0)

## 2022-08-14 LAB — CBC WITH DIFFERENTIAL/PLATELET
Abs Immature Granulocytes: 0.11 10*3/uL — ABNORMAL HIGH (ref 0.00–0.07)
Basophils Absolute: 0 10*3/uL (ref 0.0–0.1)
Basophils Relative: 0 %
Eosinophils Absolute: 0 10*3/uL (ref 0.0–0.5)
Eosinophils Relative: 0 %
HCT: 39.1 % (ref 39.0–52.0)
Hemoglobin: 13.3 g/dL (ref 13.0–17.0)
Immature Granulocytes: 1 %
Lymphocytes Relative: 10 %
Lymphs Abs: 1 10*3/uL (ref 0.7–4.0)
MCH: 32.2 pg (ref 26.0–34.0)
MCHC: 34 g/dL (ref 30.0–36.0)
MCV: 94.7 fL (ref 80.0–100.0)
Monocytes Absolute: 1 10*3/uL (ref 0.1–1.0)
Monocytes Relative: 10 %
Neutro Abs: 8.6 10*3/uL — ABNORMAL HIGH (ref 1.7–7.7)
Neutrophils Relative %: 79 %
Platelets: 228 10*3/uL (ref 150–400)
RBC: 4.13 MIL/uL — ABNORMAL LOW (ref 4.22–5.81)
RDW: 11.9 % (ref 11.5–15.5)
WBC: 10.8 10*3/uL — ABNORMAL HIGH (ref 4.0–10.5)
nRBC: 0 % (ref 0.0–0.2)

## 2022-08-14 MED ORDER — OSMOLITE 1.5 CAL PO LIQD
1000.0000 mL | ORAL | Status: DC
Start: 2022-08-14 — End: 2022-08-19
  Administered 2022-08-14 – 2022-08-15 (×2): 1000 mL
  Filled 2022-08-14 (×3): qty 1000

## 2022-08-14 MED ORDER — VALPROIC ACID 250 MG/5ML PO SOLN
250.0000 mg | Freq: Every day | ORAL | Status: DC
Start: 2022-08-14 — End: 2022-08-19
  Administered 2022-08-14 – 2022-08-18 (×4): 250 mg
  Filled 2022-08-14 (×4): qty 5

## 2022-08-14 MED ORDER — SENNOSIDES-DOCUSATE SODIUM 8.6-50 MG PO TABS: 1.0000 | ORAL_TABLET | Freq: Two times a day (BID) | ORAL | Status: DC | PRN

## 2022-08-14 MED ORDER — SODIUM CHLORIDE 0.9 % IV SOLN: INTRAVENOUS | Status: DC

## 2022-08-14 MED ORDER — B COMPLEX-C PO TABS
1.0000 | ORAL_TABLET | Freq: Every day | ORAL | Status: DC
Start: 2022-08-15 — End: 2022-08-19
  Administered 2022-08-15 – 2022-08-19 (×3): 1
  Filled 2022-08-14 (×3): qty 1

## 2022-08-14 NOTE — Progress Notes (Signed)
Patient ID: Dustin Blanchard, male   DOB: [birth year], 71 y.o.   MRN: [id number]  Gave capacity letter to wife for her to pursue guardianship of pt. She will follow up with clerk of court to begin the process, since he is unable to do POA. She is here to observe him in therapies and see how he is doing and moving today. He is much more alert today and able to participate in therapies.

## 2022-08-14 NOTE — Progress Notes (Signed)
Physical Therapy Session Note  Patient Details  Name: Dustin Blanchard MRN: [id number] Date of Birth: Sep 10, 1951  Today's Date: 08/14/2022 PT Individual Time: 6948-5462 + 1130-1157 PT Individual Time Calculation (min): 60 min  + 27 min  Short Term Goals: Week 1:  PT Short Term Goal 1 (Week 1): Pt will complete bed mobility with maxA PT Short Term Goal 2 (Week 1): Pt will complete bed<>chair transfers with maxA and LRAD PT Short Term Goal 3 (Week 1): Pt will ambulate 80f with maxA and LRAD PT Short Term Goal 4 (Week 1): Pt will initiate stair training  Skilled Therapeutic Interventions/Progress Updates:      1st session: Direct handoff of care to start from OT with patient sitting in TIS w/c. Wife, Dustin Blanchard, at the bedside. OT assisted with ambulatory transfer from TIS w/c to a larger TIS w/c to better fit the patient. +2 minA ambulatory transfer with assist for line management (NG and PIV). Pt needing constant monitoring for reaching for his NG tube during therapy session (on B wrist restraints outside of therapy).  LPN notified of some nose bleeding from NG tube irritation. LPN entering and disconnecting PIV and NG to allow freedom of mobility. Donned knee-high TED"s with dependant assist. Gathered PLOF and social factors from wife as pt was too somnolent yesterday to provide details (pt also aphasic, nonverbal). Wife confirms pt was fully indep at baseline, chopping wood and driving. Able to live on main level of home and has 4 STE without rails. Wife reports they are getting B hand rails installed prior to DC. Wife seems very supportive.  Transported patient to main rehab gym for time management.   Instructed in gait training 1679f+ [id number] rest) with +2 assist using B HHA. Very light minA for stability. Pt with decreaed B step length, knees flexed in stance, Head in downward gaze. Increased unsteadiness with turns with some stepping strategies noted.   Stair training completed with +1  assist and had +2 assist on standby for safety. Pt negotiated up/down 4, 6inch steps with 2 hand rails. Pt self selected reciprocal stepping while forward facing, unsafe with foot placement and decreased awareness of ensuring full foot on step - he would keep only his forefoot on the step despite cues and instruction on correction.  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 Encouragement provided. Pt aphasic and nonverbal, appeared to attempt to verbalize but unable. Would shake his head yes/no at times to questions and seems to understand simple verbal commands.  Returned to his room and completed ambulatory transfer with +1 assist using HHA to his bed. MinA for sit>supine and repositioned to comfort. B wrist restraints applied to prevent pulling at NG tube. All needs met, bed alarm on. Pt quickly falling asleep once returned to bed.    2nd session: Pt presenting in bed with his wife at the bedside. Pt awake but lethargic. Agreeable to therapy with no indications of pain. Pt nodding head 'yes' and laughing when asked if he needs to use the bathroom. Supine<>sitting EOB with min/modA with hospital bed features. Wife assisting with IV pole management during session. Sit<>stand from EOB with min/modA and no AD. Ambulates within his room to the bathroom with minA and HHA - poor motor planning while navigating tight spaces in the bathroom, also noted R inattention impacting safety awareness. Pt required maxA for clothes in standing and was continent of  bladder and loose stool (pt on IV anti-biotics). He required totalA for posterior pericare and applied a new clean brief. He ambulated back to his bed in similar manner as above and required modA for sit>supine for BLE management and trunk support. Pt able to reposition towards HOB with minA. Pt appearing more fatigued this session compared to earlier. B wrist  restraints donned, bed alarm on, all needs met.     Therapy Documentation Precautions:  Precautions Precautions: Fall Precaution Comments: NG tube, B wrist restraints, pelvic restraint Restrictions Weight Bearing Restrictions: No General:     Therapy/Group: Individual Therapy  Dejanae Helser P Zoanne Newill PT 08/14/2022, 7:54 AM

## 2022-08-14 NOTE — Progress Notes (Signed)
PROGRESS NOTE   Subjective/Complaints: No new complaints this morning Wife at bedside More alert with OT Discussed pneumonia and UTI  ROS: unable to obtain given mental status. Wife notes seems to be leaking tube feeds.  Objective:   CT HEAD WO CONTRAST ( )  Result Date: 08/14/2022 CLINICAL DATA:  Mental status change. EXAM: CT HEAD WITHOUT CONTRAST TECHNIQUE: Contiguous axial images were obtained from the base of the skull through the vertex without intravenous contrast. RADIATION DOSE REDUCTION: This exam was performed according to the departmental dose-optimization program which includes automated exposure control, adjustment of the mA and/or kV according to patient size and/or use of iterative reconstruction technique. COMPARISON:  CT head and brain MRI 08/06/2022 FINDINGS: Brain: There is a small focus of acute to early subacute intraparenchymal hemorrhage in the left external capsule measuring up to 1.2 cm AP x 0.7 cm TV by 1.5 cm cc, similar in size to the prior brain MRI allowing for differences in modality. There is mild surrounding edema but no mass effect. Previously seen hyperdensity overlying the left cerebral convexity on the prior head CT has resolved. There is no new extra-axial blood. There is hypodensity in the left cerebral hemisphere primarily involving the frontal operculum consistent with evolving ischemia as seen on prior MRI. There is no new acute large vessel territorial infarct. Background parenchymal volume is stable. The ventricles are stable in size. A remote infarct in the left occipital lobe is unchanged. There is no midline shift. Vascular: There is calcification of the bilateral cavernous ICAs. Skull: Normal. Negative for fracture or focal lesion. Sinuses/Orbits: The paranasal sinuses are clear. The globes and orbits are unremarkable. Other: A nasoenteric catheter is partially imaged. IMPRESSION: 1. Small focus  of acute to early subacute intraparenchymal blood in the left external capsule, unchanged in size compared to the MRI from 08/06/2022 and without significant mass effect. 2. Resolved hyperdensity overlying the left cerebral convexity which likely reflected primarily extravasated contrast. 3. Evolving infarcts in the left MCA distribution. 4. No new extra-axial blood or new acute territorial infarct. Electronically Signed   By: Lesia Hausen M.D.   On: 08/14/2022 08:12   DG CHEST PORT 1 VIEW  Result Date: 08/13/2022 CLINICAL DATA:  Fever EXAM: PORTABLE CHEST 1 VIEW COMPARISON:  08/13/2022 FINDINGS: Esophageal tube tip in the stomach. Low lung volumes. Elevation of right diaphragm. Streaky right basilar opacity. Cardiomegaly. IMPRESSION: Elevated right diaphragm with right basilar streaky atelectasis or mild pneumonia. Cardiomegaly Electronically Signed   By: Jasmine Pang M.D.   On: 08/13/2022 23:43   DG Chest 2 View  Result Date: 08/13/2022 CLINICAL DATA:  Chest crackles. EXAM: CHEST - 2 VIEW COMPARISON:  None Available. FINDINGS: Cardiac enlargement. Vascularity normal. Negative for heart failure. Right lower lobe airspace disease with elevated right hemidiaphragm. This may be atelectasis or pneumonia. No pleural effusion. Left lung clear NG tube in the stomach. IMPRESSION: Elevated right hemidiaphragm. Right lower lobe atelectasis or pneumonia. NG tube in the stomach. Electronically Signed   By: Marlan Palau M.D.   On: 08/13/2022 19:03   DG Abd Portable 1V  Result Date: 08/13/2022 CLINICAL DATA:  NG placement. EXAM: PORTABLE ABDOMEN - 1 VIEW  COMPARISON:  Chest radiograph dated 08/12/2022. FINDINGS: Enteric tube with tip and side-port in the left upper abdomen, in the body of the stomach. Mild cardiomegaly. No acute osseous pathology. IMPRESSION: Enteric tube with tip and side-port in the body of the stomach. Electronically Signed   By: Elgie Collard M.D.   On: 08/13/2022 00:17   DG Abd Portable  1V  Result Date: 08/12/2022 CLINICAL DATA:  Feeding tube placement EXAM: PORTABLE ABDOMEN - 1 VIEW COMPARISON:  08/10/2022 FINDINGS: Limited radiograph of the lower chest and upper abdomen was obtained for the purposes of enteric tube localization. Enteric tube is seen coursing below the diaphragm with distal tip terminating within the upper chest approximately 19 cm above the level of the GE junction. IMPRESSION: Enteric tube tip terminating within the upper chest centrally, approximately 19 cm above the level of the GE junction. Recommend advancing into the stomach. As the tube projects over the tracheal air column, endotracheal placement of the feeding tube is not excluded. Electronically Signed   By: Duanne Guess D.O.   On: 08/12/2022 16:42    Recent Labs    08/13/22 2318 08/14/22 0811  WBC 9.3 10.8*  HGB 12.4* 13.3  HCT 36.2* 39.1  PLT 319 228   Recent Labs    08/13/22 0520 08/13/22 2318  NA 141 133*  K 4.4 3.6  CL 111 98  CO2 23 26  GLUCOSE 140* 211*  BUN 22 30*  CREATININE 0.80 1.32*  CALCIUM 9.1 9.0    Intake/Output Summary (Last 24 hours) at 08/14/2022 1256 Last data filed at 08/13/2022 1700 Gross per 24 hour  Intake 807.5 ml  Output --  Net 807.5 ml        Physical Exam: Vital Signs Blood pressure (!) 106/58, pulse (!) 55, temperature 99.2 F (37.3 C), temperature source Oral, resp. rate 16, height 6' (1.829 m), weight 68.2 kg, SpO2 97 %. Gen: no distress, normal appearing, BMI 20.39, somnolent HEENT: oral mucosa pink and moist, NCAT, Cortrak in place Cardio: Bradycardia Chest: normal effort, normal rate of breathing Abd: soft, non-distended Ext: no edema Psych: pleasant, normal affect Skin: intact Neurological:     Comments: Patient is alert.  Right facial droop. Globally aphasic.  He would follow some simple motor commands with significant prompting.     Assessment/Plan: 1. Functional deficits which require 3+ hours per day of interdisciplinary  therapy in a comprehensive inpatient rehab setting. Physiatrist is providing close team supervision and 24 hour management of active medical problems listed below. Physiatrist and rehab team continue to assess barriers to discharge/monitor patient progress toward functional and medical goals  Care Tool:  Bathing    Body parts bathed by patient: Face, Chest, Left upper leg   Body parts bathed by helper: Right arm, Left arm, Abdomen, Front perineal area, Buttocks, Right lower leg, Left lower leg, Right upper leg     Bathing assist Assist Level: Maximal Assistance - Patient 24 - 49%     Upper Body Dressing/Undressing Upper body dressing   What is the patient wearing?: Pull over shirt    Upper body assist Assist Level: Maximal Assistance - Patient 25 - 49%    Lower Body Dressing/Undressing Lower body dressing      What is the patient wearing?: Pants, Incontinence brief     Lower body assist Assist for lower body dressing: Maximal Assistance - Patient 25 - 49%     Toileting Toileting    Toileting assist Assist for toileting: Maximal Assistance -  Patient 25 - 49%     Transfers Chair/bed transfer  Transfers assist  Chair/bed transfer activity did not occur: Safety/medical concerns  Chair/bed transfer assist level: Minimal Assistance - Patient > 75%     Locomotion Ambulation   Ambulation assist   Ambulation activity did not occur: Safety/medical concerns  Assist level: 2 helpers Assistive device: Hand held assist Max distance: 122ft   Walk 10 feet activity   Assist  Walk 10 feet activity did not occur: Safety/medical concerns  Assist level: 2 helpers Assistive device: Hand held assist   Walk 50 feet activity   Assist Walk 50 feet with 2 turns activity did not occur: Safety/medical concerns  Assist level: Minimal Assistance - Patient > 75%      Walk 150 feet activity   Assist Walk 150 feet activity did not occur: Safety/medical concerns  Assist  level: Minimal Assistance - Patient > 75% Assistive device: Hand held assist    Walk 10 feet on uneven surface  activity   Assist Walk 10 feet on uneven surfaces activity did not occur: Safety/medical concerns         Wheelchair     Assist Is the patient using a wheelchair?: Yes Type of Wheelchair: Manual Wheelchair activity did not occur: Safety/medical concerns         Wheelchair 50 feet with 2 turns activity    Assist    Wheelchair 50 feet with 2 turns activity did not occur: Safety/medical concerns       Wheelchair 150 feet activity     Assist  Wheelchair 150 feet activity did not occur: Safety/medical concerns       Blood pressure (!) 106/58, pulse (!) 55, temperature 99.2 F (37.3 C), temperature source Oral, resp. rate 16, height 6' (1.829 m), weight 68.2 kg, SpO2 97 %.  Medical Problem List and Plan: 1. 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  Antithrombotics: -DVT/anticoagulation:  Pharmaceutical: Eliquis initiated 08/10/2022             -antiplatelet therapy: N/A 3. Pain Management: Tylenol as needed 4. 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             -antipsychotic agents: Seroquel 25 mg daily with supper and 12.5 mg 3 times daily as needed. 5. Neuropsych/cognition: This patient is not capable of making decisions on his own behalf. 6. Skin/Wound Care: Routine skin checks 7. Fluids/Electrolytes/Nutrition: Routine in and outs with follow-up chemistries 8.  New onset atrial fibrillation RVR/CHF.  Follow-up cardiology service.  Amiodarone as directed, Aldactone 12.5 mg daily.  Cardiac rate controlled.  Plan outpatient ischemic work-up after recovery from CVA 9.  Dysphagia.  NPO.   Alternative means of nutritional support.  Follow-up speech therapy 10.  Hyperlipidemia.  Continue Lipitor 11. Suboptimal vitamin D level: start 800U daily 12. Hyperglycemia: likely secondary to tube feeds, hemoglobin A1c reviewed and is 4.6, continue to monitor. Does not need CBG checks.  13. Right lower lobe pneumonia: start Ancef 14. Worsening lethargy: CT head ordered and stable, improved, likely secondary to pneumonia 15. Bradycardia: will reach out to cardiology to  see if Amiodarone can be decreased 16. UTI: on Ancef, f/u UC    LOS: 2 days A FACE TO FACE EVALUATION WAS PERFORMED  Clide Deutscher Lianni Kanaan 08/14/2022, 12:56 PM

## 2022-08-14 NOTE — Progress Notes (Signed)
Patient monitor closely will open eyes slightly when name is called and request  given with  verbal stimulation and touching..Repositioned, and oral care provided, continue TF per orders, HOB at 30 degrees.   0600 Incontinent care provided and patient shows more stimulation by moving upper and lower extremities with out being coached. more movement attempting to push against hand when repositioning, .Incontinent of urine and stool , care provided. Notified by CT Radiology, that patient has a pending CT--Brain ordered and will be up to get patient, wife remain at bedside and will inform .

## 2022-08-14 NOTE — Progress Notes (Addendum)
Speech Language Pathology Daily Session Note  Patient Details  Name: Matthew Cina MRN: [id number] Date of Birth: 1951-10-16  Today's Date: 08/14/2022 SLP Individual Time: 3338-3291 SLP Individual Time Calculation (min): 60 min  Short Term Goals: Week 1: SLP Short Term Goal 1 (Week 1): Pt will maintain alertness for 3 minute intervals given Max A multimodal cues. - Pt maintained alertness for 5+ minutes given overall Mod A multimodal cues.  SLP Short Term Goal 2 (Week 1): Following oral care, pt will participate in CSE and/or therapeutic PO trials of ice chips and tsp sips of water with minimal s/sx c/f laryngeal invasion in preparation for repeat instrumental swallow study. - Did not formally address this session. Pt with wet vocal quality at baseline with difficulty clearing suspected pharyngeal secretions.   SLP Short Term Goal 3 (Week 1): Pt will follow contextual commands x 5 with Max A multimodal cues. - Pt followed contextual commands x 5 with Mod-Max A multimodal cues.  SLP Short Term Goal 4 (Week 1): Pt will answer biographical yes/no questions x 3 given Max A multimodal cues. - Pt answered biographical yes/no questions x 5 accurately given Min-Mod A verbal and visual/written cues.   SLP Short Term Goal 5 (Week 1): Pt will participate in ongoing cognitive-linguistic assessment to further determine ST POC and aid in d/c planning. - Participated in answering yes/no questions with 50% accuracy; did not response to more basic and semi-complex questions. Unable to repeat single phonemes or words. No response to automatic speech task to include counting to 3. Unable to verbalize any words. Followed commands ~50% of the time given Max A multimodal cues.   Skilled Therapeutic Interventions: S: Pt seen this date for skilled ST intervention targeting speech and language goals outlined above. Pt received awake, though lethargic and lying in bed (HOB > 30 degrees) with b/l wrist restraints applied.  Wife arrived at onset of session. Agreeable to ST intervention at bedside.  O: Please see above for objective data re: pt's performance on targeted goals. SLP provided skilled education to pt's wife re: importance and rationale of providing oral care QID, ST POC, NPO status, aspiration precaution re: HOB placement (at or greater than 30 degrees), current deficits, and use of basic yes/no questions and Max A multimodal cues for command following. [name] was receptive to all education and returned demonstration of providing oral care via suction toothbrush with coaching from SLP. [name] also verbalized understanding of current communication limitations and that yes/no responses may not always be accurate. 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 Pt's performance appeared limited by decreased focused attention, poor initiation + motor planning, decreased attention to R, sequencing, and awareness. Required overall Mod-Max A for command following within contextual and familiar tasks and Total A for selecting his first name and pt's wife's first name in field of 3 despite Max A multimodal cues. At this time, pt's only functional communication methods is response to basic yes/no questions via head nods/shakes. Pt was noted to moan and laugh during today's session with wet vocal quality appreciated; pt unable to verbalize single phonemes nor words on command or spontaneously. Did not attempt trial ice chips, though completed oral care with Max A via suction toothbrush (SLP and wife).  A: Pt appeared minimally sitmulable for skilled ST intervention as evident by improved arousal/alertness and response to yes/no questions this date. May attempt use of yes/no questions for information gathering; however, answers are not always reliable and pt  does not always respond. Provide extra processing time and Max A multimodal cueing to facilitate command following during functional tasks and ADL's.  Continue to recommend NPO with alternate means of nutrition and hydration at this time.  P: Pt left in bed with all safety measures activated and b/l wrist restraints re-applied. Spouse present. Call bell reviewed and within reach and all immediate needs met. Continue per current ST POC next session.    Pain PAINAD (Pain Assessment in Advanced Dementia) Breathing: normal Negative Vocalization: none Facial Expression: smiling or inexpressive Body Language: relaxed Consolability: no need to console PAINAD Score: 0  Therapy/Group: Individual Therapy  Bulah Lurie A Dory Verdun 08/14/2022, 1:06 PM

## 2022-08-14 NOTE — Progress Notes (Signed)
Occupational Therapy Session Note  Patient Details  Name: Dustin Blanchard MRN: [id number] Date of Birth: August 06, 1951  Today's Date: 08/14/2022 OT Individual Time: 2778-2423 OT Individual Time Calculation (min): 72 min    Short Term Goals: Week 1:  OT Short Term Goal 1 (Week 1): Pt will sit EOB with mod a and cues for simple ADL OT Short Term Goal 2 (Week 1): Pt will bathe UB with max a and mod cues OT Short Term Goal 3 (Week 1): Pt will complete simple grooming task with mod cues with 1/3 step initiation OT Short Term Goal 4 (Week 1): Pt will don pull over shirt wiht max A and mod cues using hemi techniques  Skilled Therapeutic Interventions/Progress Updates:    Patient received supine in bed just returning from CT scan.  Patient's eyes open and turned to his name being called.  Patient's restraints released - wife at bedside.  Patient without any signs of agitation.  Provided brief orientation to patient and asked him to sit at edge of bed.  Patient needed increased time and intermittent physical assistance, but followed gestural and verbal commands.  Patient completed stand step transfer to wheelchair, and used feet to help propel self toward sink.  Patient needed hand over hand guidance to start bathing, but then could participate.  Patient stopped washing - and when asked if he needed to use the bathroom he nodded yes.  Patient transported into bathroom and again completed stand step transfer to toilet.  Patient with stool already in brief, but completed MB and had continent void on toilet.  Patient's wife assisted with hygiene.  Transferred back to wheelchair to finish bathing and dressing.  Patient's wife very attentive to patient during this session.  Patient transferred into taller tilt in space chair to better accommodate his frame.  Direct hand off to PT.    Therapy Documentation Precautions:  Precautions Precautions: Fall Precaution Comments: NG tube, B wrist restraints, pelvic  restraint Restrictions Weight Bearing Restrictions: No  Pain:  Patient shakes head no when asked if he has pain   Therapy/Group: Individual Therapy  Collier Salina 08/14/2022, 9:42 AM

## 2022-08-15 ENCOUNTER — Inpatient Hospital Stay (HOSPITAL_COMMUNITY): Payer: Medicare HMO

## 2022-08-15 DIAGNOSIS — R1312 Dysphagia, oropharyngeal phase: Secondary | ICD-10-CM | POA: Diagnosis not present

## 2022-08-15 DIAGNOSIS — J189 Pneumonia, unspecified organism: Secondary | ICD-10-CM

## 2022-08-15 DIAGNOSIS — N3000 Acute cystitis without hematuria: Secondary | ICD-10-CM | POA: Diagnosis not present

## 2022-08-15 DIAGNOSIS — I63512 Cerebral infarction due to unspecified occlusion or stenosis of left middle cerebral artery: Secondary | ICD-10-CM | POA: Diagnosis not present

## 2022-08-15 LAB — BASIC METABOLIC PANEL
Anion gap: 8 (ref 5–15)
BUN: 20 mg/dL (ref 8–23)
CO2: 21 mmol/L — ABNORMAL LOW (ref 22–32)
Calcium: 9 mg/dL (ref 8.9–10.3)
Chloride: 107 mmol/L (ref 98–111)
Creatinine, Ser: 0.81 mg/dL (ref 0.61–1.24)
GFR, Estimated: >60 mL/min (ref >60)
Glucose, Bld: 95 mg/dL (ref 70–99)
Potassium: 4.8 mmol/L (ref 3.5–5.1)
Sodium: 136 mmol/L (ref 135–145)

## 2022-08-15 LAB — URINE CULTURE
Culture: 7000 — AB
Special Requests: NORMAL

## 2022-08-15 MED ORDER — AMIODARONE HCL 200 MG PO TABS
200.0000 mg | ORAL_TABLET | Freq: Every day | ORAL | Status: DC
Start: 2022-08-16 — End: 2022-08-19
  Administered 2022-08-16 – 2022-08-19 (×2): 200 mg
  Filled 2022-08-15 (×2): qty 1

## 2022-08-15 NOTE — Progress Notes (Signed)
Physical Therapy Session Note  Patient Details  Name: Dustin Blanchard MRN: [id number] Date of Birth: Nov 10, 1951  Today's Date: 08/15/2022 PT Individual Time: 1350-1445 PT Individual Time Calculation (min): 55 min   Short Term Goals: Week 1:  PT Short Term Goal 1 (Week 1): Pt will complete bed mobility with maxA PT Short Term Goal 2 (Week 1): Pt will complete bed<>chair transfers with maxA and LRAD PT Short Term Goal 3 (Week 1): Pt will ambulate 67ft with maxA and LRAD PT Short Term Goal 4 (Week 1): Pt will initiate stair training  Skilled Therapeutic Interventions/Progress Updates:      Therapy Documentation Precautions:  Precautions Precautions: Fall Precaution Comments: NG tube, B wrist restraints, pelvic restraint Restrictions Weight Bearing Restrictions: No   Other Treatments:   Pt initially sleeping soundly, difficult to arouse.  Supine to sit total to initiate but mod to complete.  Sits at edge of bed w/mod assist fading to min assist. Heavily flexed cervical spine w/downward gaze. Sit to stand w/+2 HHA overall min.  stand pivot transfert o wc w/same, mod assist w/turning/backing. Transported to gym. Sit to stand w/mod assist Gait 172 ft x 2 w/+2 HHA/mod overall, heavily flexed posture w/downward gaze and difficulty correcting, HHA provided in high guard position to facilitate upright.  Decreased clearance RLE vs L.  Seated overhead reaching task to promote cervical strengthening/extenstion w/chair tilted a bit and manual assist to facilitate extension, hand over hand assist to initiate task/maintain task due to pt very easily distracted w/very limited attention, prone to falling asleep. Worked on clipping clothespins to overhead basketball net, then removing and reaching to place on table to L and cross body to R (much more cueing required), unable to perform w/RUE.    Pt w/increased tone cervical flexors contributing to difficulty w/head control, drooling.  Pt left oob in  wc, w/wc tilted for safety, family in room/very attentive to pt and agreed to supervise, nursing aware, w/alarm belt set and needs in reach   Therapy/Group: Individual Therapy Rada Hay, PT   Shearon Balo 08/15/2022, 4:20 PM

## 2022-08-15 NOTE — Progress Notes (Addendum)
PROGRESS NOTE   Subjective/Complaints: Pt pulled out NG tube last night, it was replaced. No additional concerns.  Wife at bedside  ROS: unable to obtain given mental status.   Objective:   DG Abd 1 View  Result Date: 08/15/2022 CLINICAL DATA:  Evaluate feeding tube placement EXAM: ABDOMEN - 1 VIEW COMPARISON:  None Available. FINDINGS: The NG/OG tube has been advanced in the interval. The side port is in the distal stomach/antral region. The distal tip is likely either at or just beyond the pylorus. No other changes. IMPRESSION: The NG/OG tube is been advanced with the side port in the distal body/antral region and the distal tip at or just beyond the pylorus. Electronically Signed   By: Dorise Bullion III M.D.   On: 08/15/2022 08:31   DG Abd 1 View  Result Date: 08/15/2022 CLINICAL DATA:  Evaluate NG tube EXAM: ABDOMEN - 1 VIEW COMPARISON:  August 15, 2022 at 3:01 a.m. FINDINGS: The NG tube terminates in the stomach. Both the side port and distal tip are in the stomach. The distal tip is likely in the distal gastric body. No other acute abnormalities. IMPRESSION: The NG tube appears to be in good position as above. Electronically Signed   By: Dorise Bullion III M.D.   On: 08/15/2022 08:30   DG Abd 1 View  Result Date: 08/15/2022 CLINICAL DATA:  Check gastric catheter placement EXAM: ABDOMEN - 1 VIEW COMPARISON:  None Available. FINDINGS: Gastric catheter is noted with the tip in the stomach. Proximal side hole however lies in the distal esophagus. This should be advanced several cm deeper into the stomach. IMPRESSION: Gastric catheter as described. This should be advanced several cm deeper into the stomach. Electronically Signed   By: Inez Catalina M.D.   On: 08/15/2022 03:22   CT HEAD WO CONTRAST (5MM)  Result Date: 08/14/2022 CLINICAL DATA:  Mental status change. EXAM: CT HEAD WITHOUT CONTRAST TECHNIQUE: Contiguous axial images  were obtained from the base of the skull through the vertex without intravenous contrast. RADIATION DOSE REDUCTION: This exam was performed according to the departmental dose-optimization program which includes automated exposure control, adjustment of the mA and/or kV according to patient size and/or use of iterative reconstruction technique. COMPARISON:  CT head and brain MRI 08/06/2022 FINDINGS: Brain: There is a small focus of acute to early subacute intraparenchymal hemorrhage in the left external capsule measuring up to 1.2 cm AP x 0.7 cm TV by 1.5 cm cc, similar in size to the prior brain MRI allowing for differences in modality. There is mild surrounding edema but no mass effect. Previously seen hyperdensity overlying the left cerebral convexity on the prior head CT has resolved. There is no new extra-axial blood. There is hypodensity in the left cerebral hemisphere primarily involving the frontal operculum consistent with evolving ischemia as seen on prior MRI. There is no new acute large vessel territorial infarct. Background parenchymal volume is stable. The ventricles are stable in size. A remote infarct in the left occipital lobe is unchanged. There is no midline shift. Vascular: There is calcification of the bilateral cavernous ICAs. Skull: Normal. Negative for fracture or focal lesion. Sinuses/Orbits: The paranasal sinuses  are clear. The globes and orbits are unremarkable. Other: A nasoenteric catheter is partially imaged. IMPRESSION: 1. Small focus of acute to early subacute intraparenchymal blood in the left external capsule, unchanged in size compared to the MRI from 08/06/2022 and without significant mass effect. 2. Resolved hyperdensity overlying the left cerebral convexity which likely reflected primarily extravasated contrast. 3. Evolving infarcts in the left MCA distribution. 4. No new extra-axial blood or new acute territorial infarct. Electronically Signed   By: Lesia Hausen M.D.   On:  08/14/2022 08:12   DG CHEST PORT 1 VIEW  Result Date: 08/13/2022 CLINICAL DATA:  Fever EXAM: PORTABLE CHEST 1 VIEW COMPARISON:  08/13/2022 FINDINGS: Esophageal tube tip in the stomach. Low lung volumes. Elevation of right diaphragm. Streaky right basilar opacity. Cardiomegaly. IMPRESSION: Elevated right diaphragm with right basilar streaky atelectasis or mild pneumonia. Cardiomegaly Electronically Signed   By: Jasmine Pang M.D.   On: 08/13/2022 23:43   DG Chest 2 View  Result Date: 08/13/2022 CLINICAL DATA:  Chest crackles. EXAM: CHEST - 2 VIEW COMPARISON:  None Available. FINDINGS: Cardiac enlargement. Vascularity normal. Negative for heart failure. Right lower lobe airspace disease with elevated right hemidiaphragm. This may be atelectasis or pneumonia. No pleural effusion. Left lung clear NG tube in the stomach. IMPRESSION: Elevated right hemidiaphragm. Right lower lobe atelectasis or pneumonia. NG tube in the stomach. Electronically Signed   By: Marlan Palau M.D.   On: 08/13/2022 19:03    Recent Labs    08/13/22 2318 08/14/22 0811  WBC 9.3 10.8*  HGB 12.4* 13.3  HCT 36.2* 39.1  PLT 319 228    Recent Labs    08/13/22 2318 08/15/22 0704  NA 133* 136  K 3.6 4.8  CL 98 107  CO2 26 21*  GLUCOSE 211* 95  BUN 30* 20  CREATININE 1.32* 0.81  CALCIUM 9.0 9.0     Intake/Output Summary (Last 24 hours) at 08/15/2022 1257 Last data filed at 08/14/2022 1600 Gross per 24 hour  Intake 184.52 ml  Output --  Net 184.52 ml         Physical Exam: Vital Signs Blood pressure 118/64, pulse 60, temperature 99 F (37.2 C), temperature source Oral, resp. rate 18, height 6' (1.829 m), weight 63.7 kg, SpO2 100 %. Gen: no distress, normal appearing, BMI 20.39, somnolent HEENT: oral mucosa pink and moist, NCAT, Cortrak in place Cardio: RRR Chest: CTAB, normal effort, normal rate of breathing Abd: soft, non-distended Ext: no edema Psych: pleasant, normal affect Skin:  intact Neurological:     Comments: Patient is alert.  Right facial droop. Globally aphasic.  He would follow some simple motor commands with significant prompting.     Assessment/Plan: 1. Functional deficits which require 3+ hours per day of interdisciplinary therapy in a comprehensive inpatient rehab setting. Physiatrist is providing close team supervision and 24 hour management of active medical problems listed below. Physiatrist and rehab team continue to assess barriers to discharge/monitor patient progress toward functional and medical goals  Care Tool:  Bathing    Body parts bathed by patient: Face, Chest, Left upper leg   Body parts bathed by helper: Right arm, Left arm, Abdomen, Front perineal area, Buttocks, Right lower leg, Left lower leg, Right upper leg     Bathing assist Assist Level: Maximal Assistance - Patient 24 - 49%     Upper Body Dressing/Undressing Upper body dressing   What is the patient wearing?: Pull over shirt    Upper body assist Assist  Level: Maximal Assistance - Patient 25 - 49%    Lower Body Dressing/Undressing Lower body dressing      What is the patient wearing?: Pants, Incontinence brief     Lower body assist Assist for lower body dressing: Maximal Assistance - Patient 25 - 49%     Toileting Toileting    Toileting assist Assist for toileting: Maximal Assistance - Patient 25 - 49%     Transfers Chair/bed transfer  Transfers assist  Chair/bed transfer activity did not occur: Safety/medical concerns  Chair/bed transfer assist level: Minimal Assistance - Patient > 75%     Locomotion Ambulation   Ambulation assist   Ambulation activity did not occur: Safety/medical concerns  Assist level: 2 helpers Assistive device: Hand held assist Max distance: 129ft   Walk 10 feet activity   Assist  Walk 10 feet activity did not occur: Safety/medical concerns  Assist level: 2 helpers Assistive device: Hand held assist   Walk 50  feet activity   Assist Walk 50 feet with 2 turns activity did not occur: Safety/medical concerns  Assist level: Minimal Assistance - Patient > 75%      Walk 150 feet activity   Assist Walk 150 feet activity did not occur: Safety/medical concerns  Assist level: Minimal Assistance - Patient > 75% Assistive device: Hand held assist    Walk 10 feet on uneven surface  activity   Assist Walk 10 feet on uneven surfaces activity did not occur: Safety/medical concerns         Wheelchair     Assist Is the patient using a wheelchair?: Yes Type of Wheelchair: Manual Wheelchair activity did not occur: Safety/medical concerns         Wheelchair 50 feet with 2 turns activity    Assist    Wheelchair 50 feet with 2 turns activity did not occur: Safety/medical concerns       Wheelchair 150 feet activity     Assist  Wheelchair 150 feet activity did not occur: Safety/medical concerns       Blood pressure 118/64, pulse 60, temperature 99 F (37.2 C), temperature source Oral, resp. rate 18, height 6' (1.829 m), weight 63.7 kg, SpO2 100 %.  Medical Problem List and Plan: 1. 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  Antithrombotics: -DVT/anticoagulation:  Pharmaceutical: Eliquis initiated 08/10/2022             -antiplatelet therapy: N/A 3. Pain Management: Tylenol as needed 4. 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             -antipsychotic agents: Seroquel 25 mg daily with supper and 12.5 mg 3 times daily as needed. 5. Neuropsych/cognition: This patient is not capable of making decisions on his own behalf. 6. Skin/Wound Care: Routine skin checks 7. Fluids/Electrolytes/Nutrition: Routine in and outs with  follow-up chemistries 8.  New onset atrial fibrillation RVR/CHF.  Follow-up cardiology service.  Amiodarone as directed, Aldactone 12.5 mg daily.  Cardiac rate controlled.  Plan outpatient ischemic work-up after recovery from CVA 9.  Dysphagia.  NPO.  Alternative means of nutritional support.  Follow-up speech therapy  -  08/15/22 He pulled out NG tube, replaced, Restraints started 10.  Hyperlipidemia.  Continue Lipitor 11. Suboptimal vitamin D level: start 800U daily 12. Hyperglycemia: likely secondary to tube feeds, hemoglobin A1c reviewed and is 4.6, continue to monitor. Does not need CBG checks.  13. Right lower lobe pneumonia: start Ancef  -8/26 O2 sats 100% on RA, normal RR, continue ancef 14. Worsening lethargy: CT head ordered and stable, improved, likely secondary to pneumonia 15. Bradycardia: will reach out to cardiology to see if Amiodarone can be decreased  -Spoke with cardiology 8/26, will decrease amiodarone to 200daily, appreciate assistance  16. UTI: on Ancef, f/u UC- 7000 CFU e cloacae resistant to cefazolin  -Foley draining clear yellow urine, only 7000CFU-likely more contaminant- continue to monitor    LOS: 3 days A FACE TO FACE EVALUATION WAS PERFORMED  Jennye Boroughs 08/15/2022, 12:57 PM

## 2022-08-15 NOTE — Progress Notes (Signed)
Occupational Therapy Session Note  Patient Details  Name: Dustin Blanchard MRN: [id number] Date of Birth: 06-04-1951  Today's Date: 08/15/2022 OT Individual Time: 1045-1200 OT Individual Time Calculation (min): 75 min    Short Term Goals: Week 1:  OT Short Term Goal 1 (Week 1): Pt will sit EOB with mod a and cues for simple ADL OT Short Term Goal 2 (Week 1): Pt will bathe UB with max a and mod cues OT Short Term Goal 3 (Week 1): Pt will complete simple grooming task with mod cues with 1/3 step initiation OT Short Term Goal 4 (Week 1): Pt will don pull over shirt wiht max A and mod cues using hemi techniques Week 2:     Skilled Therapeutic Interventions/Progress Updates:    Patient in bed upon arrival with family present, the pt was very lethargic  this treatment session. I provided a cold cloth to was his face, attempted to awake him with sternal rub and range BUE, the pt offered very little response other than occassionally opening of his eyes  in response to staff and family attempting to engage him.  The pt was able to come to the EOB with MaxA x2 and he was able to come from EOB to standing with ModA using the RW and ModA x2 transfer to the w/c . The pt was transfer back to bed secondary to poor compliance while engaging in hand over hand techniques for washing his face and instructing him in AROM for BUE. The pt was very lethargic this treatment session The pt was returned to bed LOF requiring MaxA x2 with his restraints in place and his bed alarm activated.  The family was present throughout the treatment session.   Therapy Documentation Precautions:  Precautions Precautions: Fall Precaution Comments: NG tube, B wrist restraints, pelvic restraint Restrictions Weight Bearing Restrictions: No   Therapy/Group: Individual Therapy  Lavona Mound 08/15/2022, 4:47 PM

## 2022-08-15 NOTE — IPOC Note (Signed)
Overall Plan of Care [facility]) Patient Details Name: Dustin Blanchard MRN: [id number] DOB: [birth year]  Admitting Diagnosis: Left middle cerebral artery stroke [facility])  Hospital Problems: Principal Problem:   Left middle cerebral artery stroke (HCC) Active Problems:   Acute ischemic left MCA stroke (HCC)     Functional Problem List: Nursing Behavior, Nutrition, Bladder, Bowel, Perception, Safety, Endurance, Sensory, Medication Management, Skin Integrity  PT Balance, Behavior, Endurance, Motor, Nutrition, Pain, Perception, Safety, Sensory, Skin Integrity  OT Balance, Behavior, Cognition, Endurance, Motor, Nutrition, Vision, Sensory, Safety, Perception  SLP    TR         Basic ADL's: OT Eating, Grooming, Bathing, Dressing, Toileting     Advanced  ADL's: OT       Transfers: PT Bed Mobility, Bed to Chair, Customer service manager, Tub/Shower     Locomotion: PT Ambulation, Psychologist, prison and probation services, Stairs     Additional Impairments: OT Fuctional Use of Upper Extremity  SLP Swallowing, Communication, Social Cognition comprehension, expression Attention, Awareness  TR      Anticipated Outcomes Item Anticipated Outcome  Self Feeding NPO thus TBA  Swallowing  Max A   Basic self-care  mod A  Toileting  mod A   Bathroom Transfers mod A  Bowel/Bladder  to be continent x 2  Transfers  minA  Locomotion  minA  Communication  Max A  Cognition  Max A  Pain  less than 3  Safety/Judgment  to remain fall free while in rehab   Therapy Plan: PT Intensity: Minimum of 1-2 x/day ,45 to 90 minutes PT Frequency: 5 out of 7 days PT Duration Estimated Length of Stay: 2 weeks pending progress OT Intensity: Minimum of 1-2 x/day, 45 to 90 minutes OT Frequency: 5 out of 7 days OT Duration/Estimated Length of Stay: 14 days pending progress SLP Intensity: Minumum of 1-2 x/day, 30 to 90 minutes SLP Frequency: 3 to 5 out of 7 days SLP Duration/Estimated Length of Stay: ~2 weeks   Team  Interventions: Nursing Interventions Patient/Family Education, Disease Management/Prevention, Skin Care/Wound Management, Discharge Planning, Bladder Management, Cognitive Remediation/Compensation, Psychosocial Support, Bowel Management, Medication Management, Dysphagia/Aspiration Precaution Training  PT interventions Ambulation/gait training, Discharge planning, Functional mobility training, Psychosocial support, Therapeutic Activities, Visual/perceptual remediation/compensation, Wheelchair propulsion/positioning, Therapeutic Exercise, Skin care/wound management, Neuromuscular re-education, Disease management/prevention, Warden/ranger, Cognitive remediation/compensation, DME/adaptive equipment instruction, Pain management, Splinting/orthotics, UE/LE Strength taining/ROM, UE/LE Coordination activities, Stair training, Patient/family education, Functional electrical stimulation, Community reintegration  OT Interventions Warden/ranger, Community reintegration, Disease mangement/prevention, Development worker, international aid stimulation, Neuromuscular re-education, Patient/family education, Self Care/advanced ADL retraining, Splinting/orthotics, Therapeutic Exercise, UE/LE Coordination activities, Wheelchair propulsion/positioning, Cognitive remediation/compensation, Discharge planning, DME/adaptive equipment instruction, Functional mobility training, Psychosocial support, Therapeutic Activities, UE/LE Strength taining/ROM, Visual/perceptual remediation/compensation  SLP Interventions Functional tasks, Therapeutic Activities, Patient/family education, Dysphagia/aspiration precaution training  TR Interventions    SW/CM Interventions Discharge Planning, Psychosocial Support, Patient/Family Education   Barriers to Discharge MD  Medical stability  Nursing Decreased caregiver support, Incontinence, Wound Care, Behavior, Nutrition means    PT Incontinence, Lack of/limited family support, Insurance  for SNF coverage, Weight, Behavior, Nutrition means, Home environment access/layout, Decreased caregiver support    OT Incontinence, Behavior, Nutrition means, Other (comments) (poor LOA this eval wiht earlier behavioral/safety issues) NPO/NG tube. poor LOA and perticipation, significant physical assist for all care  SLP      SW Insurance for SNF coverage     Team Discharge Planning: Destination: PT-Home ,OT- Home , SLP-Home Projected Follow-up: PT-Home health PT, 24 hour supervision/assistance, OT-  Home health OT, 24 hour supervision/assistance, SLP-Home Health SLP, 24 hour supervision/assistance Projected Equipment Needs: PT-To be determined, OT- Wheelchair cushion (measurements), Wheelchair (measurements), To be determined, SLP-To be determined Equipment Details: PT- , OT-most likely hospital bed, w/c if d/cing home vs SNF Patient/family involved in discharge planning: PT- Patient,  OT-Patient, Patient unable/family or caregiver not available, SLP-Patient unable/family or caregive not available  MD ELOS: 10-14 days Medical Rehab Prognosis:  Excellent Assessment: The patient has been admitted for CIR therapies with the diagnosis of CVA.The team will be addressing functional mobility, strength, stamina, balance, safety, adaptive techniques and equipment, self-care, bowel and bladder mgt, patient and caregiver education. Goals have been set at supervision. Anticipated discharge destination is home.         See Team Conference Notes for weekly updates to the plan of care

## 2022-08-15 NOTE — Progress Notes (Signed)
Observed patient alert  and resting in bed  fowlers position,speech is garbled. Wife at bedside stating he has been more awake today. NGT intact to right nare,with small amount of old bloody drainage to nasal area, Continue TF per orders, IVF's infusing at 50 ml to Left FA No acute distress noted, reassurance continue.  08/15/22 @ 0055 NGT found in bed on rounding,notified on call  and new orders received, NGT reinserted to left nare, tolerated well.,XRAY requested for confirmation of placement.

## 2022-08-15 NOTE — Progress Notes (Signed)
Speech Language Pathology Daily Session Note  Patient Details  Name: Dustin Blanchard MRN: [id number] Date of Birth: 1951-01-21  Today's Date: 08/15/2022 SLP Individual Time: 0850-0950 SLP Individual Time Calculation (min): 60 min  Short Term Goals: Week 1: SLP Short Term Goal 1 (Week 1): Pt will maintain alertness for 3 minute intervals given Max A multimodal cues. SLP Short Term Goal 2 (Week 1): Following oral care, pt will participate in CSE and/or therapeutic PO trials of ice chips and tsp sips of water with minimal s/sx c/f laryngeal invasion in preparation for repeat instrumental swallow study. SLP Short Term Goal 3 (Week 1): Pt will follow contextual commands x 5 with Max A multimodal cues. SLP Short Term Goal 4 (Week 1): Pt will answer biographical yes/no questions x 3 given Max A multimodal cues. SLP Short Term Goal 5 (Week 1): Pt will participate in ongoing cognitive-linguistic assessment to further determine ST POC and aid in d/c planning.  Skilled Therapeutic Interventions:  Pt was seen for skilled ST targeting cognitive-linguistic and dysphagia goals.  Upon arrival pt was in bed, awake, alert, and agreeable to participating in treatment as indicated via head nodding.  Pt required hand over hand assist to facilitate oral care prior to trials of ice chips.  During trials, pt demonstrated what appeared to be a delay in swallow initiation.  Limited anterior hyolaryngeal movement was noted during the swallow per palpation.  Pt was able to voice on command with max assist cues and increased time.  Vocal quality was clear post trials and pt had no other overt s/s of aspiration with ~5 ice chips (trials were limited due to pt's request to stop as indicated by head shaking when asked if he wanted more).  Recommend that pt continue trials of POs with SLP to continue working toward repeat instrumental swallow study.  Pt answered environmental and biographical yes/no questions with max to total assist  gestural cues.   He had minimal vocalizations when attempting to sing or count in unison with therapist although he was noted to nod his head in time with song and rhythm of counting.  Pt appeared aware of his deficits as he frequently shook his head in what appeared to be frustration.  SLP provided support and encouragement.  Pt became restless towards end of session and began moving himself to the edge of the bed while also pulling at his NG tube.  Wife asked if pt had to go to the bathroom but pt was unable to respond.  Pt was transported to the bathroom where he was noted to have already been incontinent.  Pt left seated on commode with wife and nursing present.  Continue per current plan of care.    Pain Pain Assessment Pain Scale: 0-10 Pain Score: 0-No pain  Therapy/Group: Individual Therapy  Dustin Blanchard, Dustin Blanchard 08/15/2022, 11:25 AM

## 2022-08-15 NOTE — Progress Notes (Signed)
   Pt has been having some HR in the 50s. No reported sx.  Attending called asking about decreasing the amio.  It was planned that we would decrease it to 200 mg qd on 08/29.  Suggested that they go ahead and cut the dose back to 200 mg qd. He is on no other rate-lowering meds.   As long as HR increases appropriately with activity and no sx from a HR in the 50s, nothing further needs to be done.   Theodore Demark, PA-C 08/15/2022 1:19 PM

## 2022-08-15 NOTE — Progress Notes (Signed)
NGT reinserted in right nare after finding that patient had pulled out tubing on rounds. Wife and bedside an made aware of reinsertion, of NGT.Bilateral wrist restraint intact and readjusted.Closely monitor,ABD xray requested for confirmation of placement so that TF can be restarted.

## 2022-08-16 ENCOUNTER — Inpatient Hospital Stay (HOSPITAL_COMMUNITY): Payer: Medicare HMO

## 2022-08-16 DIAGNOSIS — J189 Pneumonia, unspecified organism: Secondary | ICD-10-CM | POA: Diagnosis not present

## 2022-08-16 DIAGNOSIS — I63512 Cerebral infarction due to unspecified occlusion or stenosis of left middle cerebral artery: Secondary | ICD-10-CM | POA: Diagnosis not present

## 2022-08-16 DIAGNOSIS — D72829 Elevated white blood cell count, unspecified: Secondary | ICD-10-CM | POA: Diagnosis not present

## 2022-08-16 DIAGNOSIS — R1312 Dysphagia, oropharyngeal phase: Secondary | ICD-10-CM | POA: Diagnosis not present

## 2022-08-16 DIAGNOSIS — R001 Bradycardia, unspecified: Secondary | ICD-10-CM

## 2022-08-16 LAB — CBC WITH DIFFERENTIAL/PLATELET
Abs Immature Granulocytes: 0.13 10*3/uL — ABNORMAL HIGH (ref 0.00–0.07)
Basophils Absolute: 0.1 10*3/uL (ref 0.0–0.1)
Basophils Relative: 1 %
Eosinophils Absolute: 0.1 10*3/uL (ref 0.0–0.5)
Eosinophils Relative: 1 %
HCT: 42.2 % (ref 39.0–52.0)
Hemoglobin: 13.7 g/dL (ref 13.0–17.0)
Immature Granulocytes: 2 %
Lymphocytes Relative: 10 %
Lymphs Abs: 0.8 10*3/uL (ref 0.7–4.0)
MCH: 32.2 pg (ref 26.0–34.0)
MCHC: 32.5 g/dL (ref 30.0–36.0)
MCV: 99.1 fL (ref 80.0–100.0)
Monocytes Absolute: 0.8 10*3/uL (ref 0.1–1.0)
Monocytes Relative: 10 %
Neutro Abs: 5.7 10*3/uL (ref 1.7–7.7)
Neutrophils Relative %: 76 %
Platelets: 244 10*3/uL (ref 150–400)
RBC: 4.26 MIL/uL (ref 4.22–5.81)
RDW: 11.9 % (ref 11.5–15.5)
WBC: 7.5 10*3/uL (ref 4.0–10.5)
nRBC: 0 % (ref 0.0–0.2)

## 2022-08-16 LAB — BASIC METABOLIC PANEL
Anion gap: 6 (ref 5–15)
BUN: 18 mg/dL (ref 8–23)
CO2: 25 mmol/L (ref 22–32)
Calcium: 8.8 mg/dL — ABNORMAL LOW (ref 8.9–10.3)
Chloride: 105 mmol/L (ref 98–111)
Creatinine, Ser: 0.78 mg/dL (ref 0.61–1.24)
GFR, Estimated: >60 mL/min (ref >60)
Glucose, Bld: 105 mg/dL — ABNORMAL HIGH (ref 70–99)
Potassium: 4.5 mmol/L (ref 3.5–5.1)
Sodium: 136 mmol/L (ref 135–145)

## 2022-08-16 NOTE — Progress Notes (Signed)
Occupational Therapy Session Note  Patient Details  Name: Dustin Blanchard MRN: [id number] Date of Birth: Jul 17, 1951  Today's Date: 08/16/2022 OT Individual Time: 1115-1200 OT Individual Time Calculation (min): 45 min    Short Term Goals: Week 1:  OT Short Term Goal 1 (Week 1): Pt will sit EOB with mod a and cues for simple ADL OT Short Term Goal 2 (Week 1): Pt will bathe UB with max a and mod cues OT Short Term Goal 3 (Week 1): Pt will complete simple grooming task with mod cues with 1/3 step initiation OT Short Term Goal 4 (Week 1): Pt will don pull over shirt wiht max A and mod cues using hemi techniques  Skilled Therapeutic Interventions/Progress Updates:    Upon OT arrival, pt appears as though he is trying to get out of his bed. Pt is in soft wrist restraints with rails up. Pt nonverbal and therapist assumed pt needed to use the bathroom. Pt moaning. Pt agreeable to OT treatment. Pt completes stand step transfer to [facility] with Mod A and doffs pants which further lead to a large episode of incontinence of loose stool. Pt became tearful and therapist assured pt he would get cleaned up. Pt was bathed completely with Max A while seated on BSC and standing infront of BSC with Min A. Significant increased time for clean up. Brief donned with Max A, clean pair of socks donned with Max A. Pt's RN notified regarding loose stool on lines indicating needing to be changed as well as his shirt but ng tube running. Pt was left sitting EOB at end of session with RN present to manage pt and change his shirt. Mod verbal cues required during session.   Therapy Documentation Precautions:  Precautions Precautions: Fall Precaution Comments: NG tube, B wrist restraints, pelvic restraint Restrictions Weight Bearing Restrictions: No    Therapy/Group: Individual Therapy  Carolin Sicks 08/16/2022, 1:27 PM

## 2022-08-16 NOTE — Progress Notes (Signed)
PROGRESS NOTE   Subjective/Complaints: He pulled out NG tube again even with use of restraints, it was replaced.    ROS: unable to obtain given mental status.   Objective:   DG Abd 1 View  Result Date: 08/15/2022 CLINICAL DATA:  Evaluate feeding tube placement EXAM: ABDOMEN - 1 VIEW COMPARISON:  None Available. FINDINGS: The NG/OG tube has been advanced in the interval. The side port is in the distal stomach/antral region. The distal tip is likely either at or just beyond the pylorus. No other changes. IMPRESSION: The NG/OG tube is been advanced with the side port in the distal body/antral region and the distal tip at or just beyond the pylorus. Electronically Signed   By: Dorise Bullion III M.D.   On: 08/15/2022 08:31   DG Abd 1 View  Result Date: 08/15/2022 CLINICAL DATA:  Evaluate NG tube EXAM: ABDOMEN - 1 VIEW COMPARISON:  August 15, 2022 at 3:01 a.m. FINDINGS: The NG tube terminates in the stomach. Both the side port and distal tip are in the stomach. The distal tip is likely in the distal gastric body. No other acute abnormalities. IMPRESSION: The NG tube appears to be in good position as above. Electronically Signed   By: Dorise Bullion III M.D.   On: 08/15/2022 08:30   DG Abd 1 View  Result Date: 08/15/2022 CLINICAL DATA:  Check gastric catheter placement EXAM: ABDOMEN - 1 VIEW COMPARISON:  None Available. FINDINGS: Gastric catheter is noted with the tip in the stomach. Proximal side hole however lies in the distal esophagus. This should be advanced several cm deeper into the stomach. IMPRESSION: Gastric catheter as described. This should be advanced several cm deeper into the stomach. Electronically Signed   By: Inez Catalina M.D.   On: 08/15/2022 03:22    Recent Labs    08/13/22 2318 08/14/22 0811  WBC 9.3 10.8*  HGB 12.4* 13.3  HCT 36.2* 39.1  PLT 319 228    Recent Labs    08/13/22 2318 08/15/22 0704  NA 133* 136   K 3.6 4.8  CL 98 107  CO2 26 21*  GLUCOSE 211* 95  BUN 30* 20  CREATININE 1.32* 0.81  CALCIUM 9.0 9.0     Intake/Output Summary (Last 24 hours) at 08/16/2022 1205 Last data filed at 08/16/2022 0514 Gross per 24 hour  Intake 3719.58 ml  Output --  Net 3719.58 ml         Physical Exam: Vital Signs Blood pressure 106/63, pulse (!) 51, temperature 98.4 F (36.9 C), temperature source Oral, resp. rate 16, height 6' (1.829 m), weight 65.9 kg, SpO2 100 %. Gen: no distress, normal appearing, BMI 20.39, somnolent HEENT: oral mucosa pink and moist, NCAT, NG tube in place Cardio: Bradycardic  Chest: CTAB, normal effort, normal rate of breathing Abd: soft, non-distended, +BS Ext: no edema Psych: pleasant, normal affect Skin: intact Neurological:     Comments: Patient is alert.  Right facial droop. Globally aphasic.  He would follow some simple motor commands with significant prompting.     Assessment/Plan: 1. Functional deficits which require 3+ hours per day of interdisciplinary therapy in a comprehensive inpatient rehab setting. Physiatrist  is providing close team supervision and 24 hour management of active medical problems listed below. Physiatrist and rehab team continue to assess barriers to discharge/monitor patient progress toward functional and medical goals  Care Tool:  Bathing    Body parts bathed by patient: Face, Chest, Left upper leg   Body parts bathed by helper: Right arm, Left arm, Abdomen, Front perineal area, Buttocks, Right lower leg, Left lower leg, Right upper leg     Bathing assist Assist Level: Maximal Assistance - Patient 24 - 49%     Upper Body Dressing/Undressing Upper body dressing   What is the patient wearing?: Pull over shirt    Upper body assist Assist Level: Maximal Assistance - Patient 25 - 49%    Lower Body Dressing/Undressing Lower body dressing      What is the patient wearing?: Pants, Incontinence brief     Lower body  assist Assist for lower body dressing: Maximal Assistance - Patient 25 - 49%     Toileting Toileting    Toileting assist Assist for toileting: Maximal Assistance - Patient 25 - 49%     Transfers Chair/bed transfer  Transfers assist  Chair/bed transfer activity did not occur: Safety/medical concerns  Chair/bed transfer assist level: Minimal Assistance - Patient > 75%     Locomotion Ambulation   Ambulation assist   Ambulation activity did not occur: Safety/medical concerns  Assist level: 2 helpers Assistive device: Hand held assist Max distance: 175ft   Walk 10 feet activity   Assist  Walk 10 feet activity did not occur: Safety/medical concerns  Assist level: 2 helpers Assistive device: Hand held assist   Walk 50 feet activity   Assist Walk 50 feet with 2 turns activity did not occur: Safety/medical concerns  Assist level: Minimal Assistance - Patient > 75%      Walk 150 feet activity   Assist Walk 150 feet activity did not occur: Safety/medical concerns  Assist level: Minimal Assistance - Patient > 75% Assistive device: Hand held assist    Walk 10 feet on uneven surface  activity   Assist Walk 10 feet on uneven surfaces activity did not occur: Safety/medical concerns         Wheelchair     Assist Is the patient using a wheelchair?: Yes Type of Wheelchair: Manual Wheelchair activity did not occur: Safety/medical concerns         Wheelchair 50 feet with 2 turns activity    Assist    Wheelchair 50 feet with 2 turns activity did not occur: Safety/medical concerns       Wheelchair 150 feet activity     Assist  Wheelchair 150 feet activity did not occur: Safety/medical concerns       Blood pressure 106/63, pulse (!) 51, temperature 98.4 F (36.9 C), temperature source Oral, resp. rate 16, height 6' (1.829 m), weight 65.9 kg, SpO2 100 %.  Medical Problem List and Plan: 1. 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  Antithrombotics: -DVT/anticoagulation:  Pharmaceutical: Eliquis initiated 08/10/2022             -antiplatelet therapy: N/A 3. Pain Management: Tylenol as  needed 4. 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             -antipsychotic agents: Seroquel 25 mg daily with supper and 12.5 mg 3 times daily as needed. 5. Neuropsych/cognition: This patient is not capable of making decisions on his own behalf. 6. Skin/Wound Care: Routine skin checks 7. Fluids/Electrolytes/Nutrition: Routine in and outs with follow-up chemistries 8.  New onset atrial fibrillation RVR/CHF.  Follow-up cardiology service.  Amiodarone as directed, Aldactone 12.5 mg daily.  Cardiac rate controlled.  Plan outpatient ischemic work-up after recovery from CVA 9.  Dysphagia.  NPO.  Alternative means of nutritional support.  Follow-up speech therapy  -08/15/22 He pulled out NG tube, replaced, Restraints started  -8/27 appears to be tolerating tube feeding but pulled out NG tube again with restraints, may need cortrak tomorrow 10.  Hyperlipidemia.  Continue Lipitor 11. Suboptimal vitamin D level: start 800U daily 12. Hyperglycemia: likely secondary to tube feeds, hemoglobin A1c reviewed and is 4.6, continue to monitor. Does not need CBG checks.  13. Right lower lobe pneumonia: start Ancef  -8/27 100% O2 RA, good air movement,afebrile, continue current treatment 14. Worsening lethargy: CT head ordered and stable, improved, likely secondary to pneumonia 15. Bradycardia: will reach out to cardiology to see if Amiodarone can be decreased  -Spoke with cardiology 8/26, will decrease amiodarone to 200daily, appreciate assistance   8/27 Continues to have episodic bradycardia, Cardiology advises  as long as HR increased with activity appropriately and no sx HR in 50s, no further action needed 16. UTI: on Ancef, f/u UC- 7000 CFU e cloacae resistant to cefazolin  -Foley draining clear yellow urine, only 7000CFU-likely more contaminant- continue to monitor  -8/27 leukocytosis resolved on CBC today  LOS: 4 days A FACE TO FACE EVALUATION WAS PERFORMED  Fanny Dance 08/16/2022, 12:05 PM

## 2022-08-16 NOTE — Progress Notes (Signed)
Occupational Therapy Session Note  Patient Details  Name: Dustin Blanchard MRN: [id number] Date of Birth: 07-03-1951  Today's Date: 08/16/2022 OT Individual Time: 6349-4944 OT Individual Time Calculation (min): 43 min    Short Term Goals: Week 1:  OT Short Term Goal 1 (Week 1): Pt will sit EOB with mod a and cues for simple ADL OT Short Term Goal 2 (Week 1): Pt will bathe UB with max a and mod cues OT Short Term Goal 3 (Week 1): Pt will complete simple grooming task with mod cues with 1/3 step initiation OT Short Term Goal 4 (Week 1): Pt will don pull over shirt wiht max A and mod cues using hemi techniques  Skilled Therapeutic Interventions/Progress Updates: Patient improved with self care skills, but has not yet met dressing and bathing goals. Patient needs extra assistance to manage clothing around NG tube and IV for safety. Utilized one step directions for ADL's and increased time needed for patient to initiate self care tasks.     Therapy Documentation Precautions:  Precautions Precautions: Fall Precaution Comments: NG tube, B wrist restraints, pelvic restraint Restrictions Weight Bearing Restrictions: No General: Patient received resting in bed with IV and NG feed running. Patient with wrist restraints bilaterally.      Pain: No Baker/Wong pain scale assessed. No signs of pain during treatment.   ADL: ADL Eating: NPO Grooming: Maximal assistance Where Assessed-Grooming: Edge of bed, Bed level Upper Body Bathing: Total assist Lower Body Bathing: Dependent Where Assessed-Lower Body Bathing: Bed level Upper Body Dressing: Max Assist Where Assessed-Upper Body Dressing: Bed level Lower Body Dressing: Total Assist Where Assessed-Lower Body Dressing: Bed level/ Edge of bed Toileting: Dependent-received incontinent of bladder Where Assessed-Toileting: Bed level Patient able to assist with hand over hand to initiate each step of bed bath and dressing task. Patient unable to  consistently answer yes/no questions and did not verbalize during this treatment session. Patient became tearful during dressing task when therapist asked if he would like lotion applied to his feet before donning new socks. Therapist unable to identify reason for distress. Did not appear to be in any pain. Continue working on functional transfers and dressing tasks to improve task initiation and ability to follow verbal cuing and directions.    Balance: Patient able to stand with minimal/moderate assistance to manage clothing.       Therapy/Group: Individual Therapy  Hermina Barters 08/16/2022, 12:31 PM

## 2022-08-17 DIAGNOSIS — I63512 Cerebral infarction due to unspecified occlusion or stenosis of left middle cerebral artery: Secondary | ICD-10-CM | POA: Diagnosis not present

## 2022-08-17 LAB — I-STAT CHEM 8, ED
BUN: 9 mg/dL (ref 8–23)
Calcium, Ion: 1.23 mmol/L (ref 1.15–1.40)
Chloride: 106 mmol/L (ref 98–111)
Creatinine, Ser: 1 mg/dL (ref 0.61–1.24)
Glucose, Bld: 109 mg/dL — ABNORMAL HIGH (ref 70–99)
HCT: 44 % (ref 39.0–52.0)
Hemoglobin: 15 g/dL (ref 13.0–17.0)
Potassium: 4.2 mmol/L (ref 3.5–5.1)
Sodium: 143 mmol/L (ref 135–145)
TCO2: 26 mmol/L (ref 22–32)

## 2022-08-17 MED ORDER — FLUOXETINE HCL 10 MG PO CAPS
10.0000 mg | ORAL_CAPSULE | Freq: Every day | ORAL | Status: DC
Start: 2022-08-17 — End: 2022-08-19
  Administered 2022-08-18: 10 mg
  Filled 2022-08-17 (×2): qty 1

## 2022-08-17 MED ORDER — TAMSULOSIN HCL 0.4 MG PO CAPS
0.4000 mg | ORAL_CAPSULE | Freq: Every day | ORAL | Status: DC
  Administered 2022-08-18 – 2022-08-24 (×7): 0.4 mg via ORAL
  Filled 2022-08-17 (×7): qty 1

## 2022-08-17 NOTE — Progress Notes (Signed)
Occupational Therapy Session Note  Patient Details  Name: Dustin Blanchard MRN: [id number] Date of Birth: 25-Oct-1951  Today's Date: 08/17/2022 OT Individual Time: 1130-1200 & 1300-1400 OT Individual Time Calculation (min): 30 min & 60 min   Short Term Goals: Week 1:  OT Short Term Goal 1 (Week 1): Pt will sit EOB with mod a and cues for simple ADL OT Short Term Goal 2 (Week 1): Pt will bathe UB with max a and mod cues OT Short Term Goal 3 (Week 1): Pt will complete simple grooming task with mod cues with 1/3 step initiation OT Short Term Goal 4 (Week 1): Pt will don pull over shirt wiht max A and mod cues using hemi techniques  Skilled Therapeutic Interventions/Progress Updates:    Session 1: Upon OT arrival, pt seated in TIS with spouse present. Pt agreeable to OT treatment. Treatment intervention with a focus on command following, item retrieval, use of the R UE, and dynamic sitting balance to improve independence during self care. Pt was transported to dayroom gym via TIS and Total A. Pt sits upright in w/c to retrieve colored squigz using B UE in all planes from mirror. Pt was able to correctly retrieve ~50 percent of squigz based on color therapist named and places into the container located to the R side. Pt requires max verbal and mod tactile cues to complete. Pt drops 3 squigz onto the floor and was able to retrieve while seated. Pt nonverbal during session. Pt tolerates activity well and requires increased time. Pt requires verbal cues to redirect as pt attempts to stand up from the TIS 3-4 times during session. Pt was transported back to his room via TIS with Total A and was left in TIS with all safety measures in place and needs met.   Session 2: Upon OT arrival, pt seated in TIS with spouse present in room. Pt spouse requesting to assist pt to bathroom/bedside commode to avoid future incontinence episodes. Treatment intervention with a focus on family training, self care training, fine  motor coordination and awareness to R side. Pt spouse and this therapist engaged in family education training including functional mobility, functional transfers, balance, positioning to R side of pt and verbal/tactile cues during self care to ensure safe handling of pt when spouse assists pt. Pt's spouse demonstrates safe handling of pt during toileting, toilet transfer, grooming, and functional mobility with Min A for all aspects. Pt cleared to assist pt with toileting, toilet transfer, and grooming to [facility] and bathroom. RN and NT made aware of clearance and board in room was updated. Pt was transported to main therapy gym via TIS and Total A for time management. Pt seated in TIS to retrieve and thread wooden beads onto plastic string using B UE. Pt holds string in the R hand and wooden beads in the L hand. Pt demonstrates mod difficulty to complete and requires increased time and verbal cues. Pt able to thread 4 beads onto string. Pt removes all 4 beads from string and hands them to this therapist to put away. Pt was transported back to his room via TIS and total A and completes stand step transfer from bed to w/c with Min A. Pt completes sit to supine transfer with CGA and was left in bed with all needs met and safety measures in place. RN and spouse present in room at end of session. Pt demonstrates improved activity tolerance, command following, and coordination in the R UE and continues to benefit from  OT services to achieve highest level of independence.   Therapy Documentation Precautions:  Precautions Precautions: Fall Precaution Comments: NG tube, B wrist restraints, pelvic restraint Restrictions Weight Bearing Restrictions: No  ADL: Grooming: Moderate cueing, Minimal assistance Where Assessed-Grooming: Standing at sink Toileting: Moderate cueing, Minimal assistance Where Assessed-Toileting: Glass blower/designer: Psychiatric nurse Method: Information systems manager: Bedside commode (located in bathroom)   Therapy/Group: Individual Therapy  Marvetta Gibbons 08/17/2022, 12:05 PM

## 2022-08-17 NOTE — Progress Notes (Addendum)
Speech Language Pathology Daily Session Note  Patient Details  Name: Carlito Bogert MRN: [id number] Date of Birth: [birth year]  Today's Date: 08/17/2022 SLP Individual Time: 1030-1130 Total SLP Time: 60 minutes    Short Term Goals: Week 1: SLP Short Term Goal 1 (Week 1): Pt will maintain alertness for 3 minute intervals given Max A multimodal cues. SLP Short Term Goal 2 (Week 1): Following oral care, pt will participate in CSE and/or therapeutic PO trials of ice chips and tsp sips of water with minimal s/sx c/f laryngeal invasion in preparation for repeat instrumental swallow study. SLP Short Term Goal 3 (Week 1): Pt will follow contextual commands x 5 with Max A multimodal cues. SLP Short Term Goal 4 (Week 1): Pt will answer biographical yes/no questions x 3 given Max A multimodal cues. SLP Short Term Goal 5 (Week 1): Pt will participate in ongoing cognitive-linguistic assessment to further determine ST POC and aid in d/c planning.  Skilled Therapeutic Interventions: S: Pt seen this date for skilled ST intervention targeting *** goals outlined above. Pt received awake/alert and ***. *** pain. Agreeable to ST intervention ***.  O: Please see above for objective data re: pt's performance on targeted goals. SLP provided skilled education re: ***  A: Pt appears *** sitmulable for skilled ST intervention as evident by ***. Recommend ***.  P: Pt left *** with all safety measures activated. Call bell reviewed and within reach and all immediate needs met. Continue per current ST POC next session.   Pain No distress noted; NAD  Therapy/Group: Individual Therapy  Dave Mannes A Adara Kittle 08/17/2022, 5:05 PM

## 2022-08-17 NOTE — Progress Notes (Addendum)
Walked in to check on pt and pt had pulled out NG tube again. Restraints still in place.  2130 - Dr. Natale Lay made aware. No new orders given.

## 2022-08-17 NOTE — Progress Notes (Signed)
Physical Therapy Session Note  Patient Details  Name: Dustin Blanchard MRN: [id number] Date of Birth: 06/15/1951  Today's Date: 08/17/2022 PT Individual Time: 0800-0858 PT Individual Time Calculation (min): 58 min   Short Term Goals: Week 1:  PT Short Term Goal 1 (Week 1): Pt will complete bed mobility with maxA PT Short Term Goal 2 (Week 1): Pt will complete bed<>chair transfers with maxA and LRAD PT Short Term Goal 3 (Week 1): Pt will ambulate 85f with maxA and LRAD PT Short Term Goal 4 (Week 1): Pt will initiate stair training  Skilled Therapeutic Interventions/Progress Updates:      Pt supine in bed to start with B wrist restraints on. Pt appears agreeable to therapy, nodding head 'yes.' Pt nonverbal throughout session. Donned knee-high compression socks with dependant assist for time. Bed mobility completed with supervision with HOB slightly raised. Sit<>stand with minA and no AD from EOB and ambulated to bathroom with minA and no AD - pt quite unsteady, reaching out for furniture to aid in stability. Pt continent of loose stool and bladder once on the toilet. He required totalA for pericare. Provided clean brief as well. Pants donned with modA for threading and pt assisting in pulling over hips in standing.   Transported to day room rehab gym for time management. Assisted to Nustep with minA ambulatory transfer. SetupA required for Nustep and he completed 10 minutes total at L3 resistance, completed for neural priming, activity tolerance. Pt with motor apraxia for task, also with difficulty maintaining RUE grip to handle.   Gait training 1656fwith minA and noAD around nurses station, forward flexed trunk, downward gaze, short stride length, LOB with distractions or head turns.   Pt emotional during session, perhaps to increased awareness of deficits and situation.   Spoke with RN at end of session to clarify needs for B wrist restriants since no more NG tube - RN approved as well as the  MD at the end of session.  Pt ended treatment reclined in TIS w/c with all needs met, safety belt alarm on. Wife entering room at end of session.,  Therapy Documentation Precautions:  Precautions Precautions: Fall Precaution Comments: NG tube, B wrist restraints, pelvic restraint Restrictions Weight Bearing Restrictions: No General:     Therapy/Group: Individual Therapy  ChAlger Simons/28/2023, 7:27 AM

## 2022-08-17 NOTE — Progress Notes (Addendum)
Patient ID: Dustin Blanchard, male   DOB: [birth year], 71 y.o.   MRN: [id number] Met with the patient and wife to review current situation, team conference, rehab process and plan of care. Patient very drowsy. Wife reported concerns with NG tube; nose guard ordered; tape/tube irritating skin, small amount of bleeding right nare noted. Nursing informed of need. Reviewed secondary risk management, medications, etc. Continue to follow along to discharge to address educational needs to facilitate preparation for discharge. Margarito Liner

## 2022-08-17 NOTE — Progress Notes (Signed)
PROGRESS NOTE   Subjective/Complaints: Patient is tearful regarding his situation this morning Wife feels he would benefit from an antidepressant   ROS: unable to obtain given mental status.   Objective:   DG Abd 1 View  Result Date: 08/16/2022 CLINICAL DATA:  NG tube placement EXAM: ABDOMEN - 1 VIEW COMPARISON:  None Available. FINDINGS: NG tube tip is in the stomach with side port near the GE junction. No gas-filled dilated loops of bowel seen in the visualized abdomen. Calcifications of the right upper quadrant, likely gallstones. Elevation of the left hemidiaphragm and left basilar atelectasis. IMPRESSION: NG tube tip is in the stomach with side port near the GE junction, recommend advancement for optimal positioning. Electronically Signed   By: Yetta Glassman M.D.   On: 08/16/2022 15:35    Recent Labs    08/16/22 1524  WBC 7.5  HGB 13.7  HCT 42.2  PLT 244   Recent Labs    08/15/22 0704 08/16/22 1837  NA 136 136  K 4.8 4.5  CL 107 105  CO2 21* 25  GLUCOSE 95 105*  BUN 20 18  CREATININE 0.81 0.78  CALCIUM 9.0 8.8*    Intake/Output Summary (Last 24 hours) at 08/17/2022 0930 Last data filed at 08/16/2022 1329 Gross per 24 hour  Intake 745 ml  Output --  Net 745 ml        Physical Exam: Vital Signs Blood pressure 120/70, pulse (!) 56, temperature 98 F (36.7 C), temperature source Oral, resp. rate 16, height 6' (1.829 m), weight 67.7 kg, SpO2 98 %. Gen: no distress, normal appearing, BMI 20.39, somnolent HEENT: oral mucosa pink and moist, NCAT, NG tube in place Cardio: Bradycardic  Chest: CTAB, normal effort, normal rate of breathing Abd: soft, non-distended, +BS Ext: no edema Psych: tearful regarding situation Skin: intact Neurological:     Comments: Patient is alert.  Right facial droop. Globally aphasic.  He would follow some simple motor commands with significant prompting.      Assessment/Plan: 1. Functional deficits which require 3+ hours per day of interdisciplinary therapy in a comprehensive inpatient rehab setting. Physiatrist is providing close team supervision and 24 hour management of active medical problems listed below. Physiatrist and rehab team continue to assess barriers to discharge/monitor patient progress toward functional and medical goals  Care Tool:  Bathing    Body parts bathed by patient: Chest, Face   Body parts bathed by helper: Right arm, Left arm, Buttocks, Right upper leg, Left upper leg, Right lower leg, Left lower leg, Front perineal area     Bathing assist Assist Level: Total Assistance - Patient < 25%     Upper Body Dressing/Undressing Upper body dressing   What is the patient wearing?: Pull over shirt    Upper body assist Assist Level: Maximal Assistance - Patient 25 - 49%    Lower Body Dressing/Undressing Lower body dressing      What is the patient wearing?: Pants, Incontinence brief     Lower body assist Assist for lower body dressing: Maximal Assistance - Patient 25 - 49%     Toileting Toileting    Toileting assist Assist for toileting: Maximal Assistance -  Patient 25 - 49%     Transfers Chair/bed transfer  Transfers assist  Chair/bed transfer activity did not occur: Safety/medical concerns  Chair/bed transfer assist level: Minimal Assistance - Patient > 75%     Locomotion Ambulation   Ambulation assist   Ambulation activity did not occur: Safety/medical concerns  Assist level: 2 helpers Assistive device: Hand held assist Max distance: 14ft   Walk 10 feet activity   Assist  Walk 10 feet activity did not occur: Safety/medical concerns  Assist level: 2 helpers Assistive device: Hand held assist   Walk 50 feet activity   Assist Walk 50 feet with 2 turns activity did not occur: Safety/medical concerns  Assist level: Minimal Assistance - Patient > 75%      Walk 150 feet  activity   Assist Walk 150 feet activity did not occur: Safety/medical concerns  Assist level: Minimal Assistance - Patient > 75% Assistive device: Hand held assist    Walk 10 feet on uneven surface  activity   Assist Walk 10 feet on uneven surfaces activity did not occur: Safety/medical concerns         Wheelchair     Assist Is the patient using a wheelchair?: Yes Type of Wheelchair: Manual Wheelchair activity did not occur: Safety/medical concerns         Wheelchair 50 feet with 2 turns activity    Assist    Wheelchair 50 feet with 2 turns activity did not occur: Safety/medical concerns       Wheelchair 150 feet activity     Assist  Wheelchair 150 feet activity did not occur: Safety/medical concerns       Blood pressure 120/70, pulse (!) 56, temperature 98 F (36.7 C), temperature source Oral, resp. rate 16, height 6' (1.829 m), weight 67.7 kg, SpO2 98 %.  Medical Problem List and Plan: 1. 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  Antithrombotics: -DVT/anticoagulation:  Pharmaceutical: Eliquis initiated 08/10/2022             -antiplatelet therapy: N/A 3. Pain Management: Tylenol as needed 4. 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             -antipsychotic agents: Seroquel 25 mg daily with supper and 12.5 mg 3 times daily as needed. 5. Neuropsych/cognition: This patient is not capable of making decisions on his own behalf. 6. Skin/Wound Care: Routine skin checks 7. Fluids/Electrolytes/Nutrition: Routine in and outs with follow-up chemistries 8.  New onset atrial fibrillation RVR/CHF.  Follow-up cardiology service.  Amiodarone as directed, Aldactone 12.5 mg  daily.  Cardiac rate controlled.  Plan outpatient ischemic work-up after recovery from CVA 9.  Dysphagia.  NPO.  Alternative means of nutritional support.  Follow-up speech therapy. Discussed eval with SLP today before replacing NGT 10.  Hyperlipidemia.  Continue Lipitor 11. Suboptimal vitamin D level: start 800U daily 12. Hyperglycemia: likely secondary to tube feeds, hemoglobin A1c reviewed and is 4.6, continue to monitor. Does not need CBG checks.  13. Right lower lobe pneumonia: continue Ancef 14. Worsening lethargy: CT head ordered and stable, improved,  likely secondary to pneumonia 15. Bradycardia: will reach out to cardiology to see if Amiodarone can be decreased  -Spoke with cardiology 8/26, will decrease amiodarone to 200daily, appreciate assistance   8/27 Continues to have episodic bradycardia, Cardiology advises as long as HR increased with activity appropriately and no sx HR in 50s, no further action needed 16. UTI: on Ancef, f/u UC- 7000 CFU e cloacae resistant to cefazolin  -Foley draining clear yellow urine, only 7000CFU-likely more contaminant- continue to monitor  -8/27 leukocytosis resolved on CBC today 18. Chronic BPH: flomax restarted HS 19. Depression: started Prozac 10mg  HS  LOS: 5 days A FACE TO FACE EVALUATION WAS PERFORMED  Shannon Kirkendall P Donae Kueker 08/17/2022, 9:30 AM

## 2022-08-18 ENCOUNTER — Inpatient Hospital Stay (HOSPITAL_COMMUNITY): Payer: Medicare HMO

## 2022-08-18 DIAGNOSIS — I63512 Cerebral infarction due to unspecified occlusion or stenosis of left middle cerebral artery: Secondary | ICD-10-CM | POA: Diagnosis not present

## 2022-08-18 MED ORDER — MELATONIN 3 MG PO TABS
3.0000 mg | ORAL_TABLET | Freq: Every day | ORAL | Status: DC
  Administered 2022-08-18 – 2022-08-24 (×7): 3 mg via ORAL
  Filled 2022-08-18 (×7): qty 1

## 2022-08-18 MED ORDER — MAGNESIUM GLUCONATE 500 MG PO TABS
250.0000 mg | ORAL_TABLET | Freq: Every day | ORAL | Status: DC
  Administered 2022-08-18 – 2022-08-24 (×7): 250 mg via ORAL
  Filled 2022-08-18 (×7): qty 1

## 2022-08-18 NOTE — Progress Notes (Signed)
Modified Barium Swallow Progress Note  Patient Details  Name: Dustin Blanchard MRN: [id number] Date of Birth: July 05, 1951  Today's Date: 08/18/2022  Modified Barium Swallow completed.  Full report located under Chart Review in the Imaging Section.  Brief recommendations include the following:  Clinical Impression MBSS completed secondary to hx of silent aspiration with all consistencies following MBSS completed on 08/07/2022. Of note, pt with subtle improvement in swallow function since MBSS completed on 08/07/2022.  Per instrumental findings, pt presents with mild-moderate oropharyngeal dysphagia in the setting of deconditioning, hx of vascular dementia, and recent CVA. Oral phase remarkable for lingual weakness, decreased bolus manipulation + cohesion, delayed oral transit, and premature spillage in the oropharynx prior to swallow initiation. Pharyngeal phase remarkable for delayed swallow initiation, reduced base of tongue approximation to the posterior pharyngeal wall,  decreased pharyngeal drive/peristalsis, diminished hyolaryngeal excursion + epiglottic inversion, and decreased airway closure.   Aforementioned deficits resulted in moderate > mild pharyngeal stasis at the level of the valleculae, along the lateral channels, and within the pyriform sinuses, post-prandially, which did not clear with additional swallows. Pt unable to follow commands to perform secondary swallow nor did he do this reflexively. Intermittent cord-level penetration noted (PAS 4), via the interarytenoid space, with intake nectar-thick liquids via cup and straw. Pt did appeared sensate to penetration and produce throat clear, which appeared to clear penetrated material from laryngeal vestibule. No aspiration resulted from aforementioned penetration. Conversely, deep penetration (PAS 5) was observed with intake of thin liquid via cup and straw, which pt was sensate to, but unable to clear. No penetration nor aspiration  appreciated with puree texture. It should be noted that pt was unable to consistently follow commands to perform postural modifications or compensatory techniques during today's study.   Given instrumental findings, pt's stable medical status, and improving ambulation, recommend initiation of Dysphagia 1 diet with nectar-thick liquids with close monitoring and strict adherence to aspiration precautions to include fully upright positioning for all PO intake, slow rate, small + single bites/sips, alternating bites/sips, minimizing distractions, and full supervision. Pt and pt's wife education on MBSS findings and recommendations, and are in agreement with proposed plan. Nevertheless, pt remains at an elevated risk of aspiration due to his cognitive-communication deficits and volume of pharyngeal stasis post-swallow. Discussed options and risk/benefits of PO diet vs alternate means of nutrition/hydration. Pt and pt's wife informed of elevated risk for aspiration, though verbalized that she would also like to trial PO diet to assess tolerance. If unable to tolerate safely or if he is unable to maintain his nutrition and hydration via PO alone, would be interested in considering long-term alternate means of nutrition and hydration via PEG. May upgrade diet textures, clinically, as able. However, recommend repeat instrumental study prior to liquid upgrade. May consider initiation of [employer] Protocol between meals, if pt tolerates current diet consistencies. Please see below for additional details.   Swallow Evaluation Recommendations   SLP Diet Recommendations: Dysphagia 1 (Puree) solids;Nectar thick liquid   Liquid Administration via: Cup;Straw   Medication Administration: Crushed with puree   Supervision: Patient able to self feed;Staff to assist with self feeding;Full supervision/cueing for compensatory strategies   Compensations: Minimize environmental distractions;Slow rate;Small  sips/bites;Follow solids with liquid;Other (Comment) (Place liquids on L side)   Postural Changes: Remain semi-upright after after feeds/meals (Comment);Seated upright at 90 degrees   Oral Care Recommendations: Oral care BID     Odies Desa A Rickayla Blanchard 08/18/2022,9:15 PM

## 2022-08-18 NOTE — Progress Notes (Signed)
Physical Therapy Session Note  Patient Details  Name: Dustin Blanchard MRN: [id number] Date of Birth: Dec 05, 1951  Today's Date: 08/18/2022 PT Individual Time: 7915-0569 + 1415-1445 PT Individual Time Calculation (min): 60 min  + 30 min  Short Term Goals: Week 1:  PT Short Term Goal 1 (Week 1): Pt will complete bed mobility with maxA PT Short Term Goal 2 (Week 1): Pt will complete bed<>chair transfers with maxA and LRAD PT Short Term Goal 3 (Week 1): Pt will ambulate 68f with maxA and LRAD PT Short Term Goal 4 (Week 1): Pt will initiate stair training  Skilled Therapeutic Interventions/Progress Updates:      1st session: Pt resting in bed with wife at bedside. Pt has posey belt and B mittens on, removed at start of session. Pt completing supine<>sitting EOB with instruction at supervision level. Able to scoot forward to EOB with supervision. Sit<>stand with no AD and light minA for stability - ambulated within his room with minA and no AD to bathroom. Required mod safety cues for approaching the toilet. Pt continent of bladder once on the toilet (charted). Required assist for donning pajama pants with R inattention noted, some motor planning deficits too with task sequencing. Ambulated to TIS w/c with minA and then transported to main rehab gym for time management.   Gait training in rehab hallways 1686f+ 16566fith minA and no AD, no HHA either compared to prior sessions. Gait remains unsteady with B knees flexed, forward flexed trunk, downward gaze, increased unsteadiness with turns.  Instructed in stair training using 6inch steps and 2 hand rails, forward facing. Pt with self selected reciprocal stepping for ascent and step-to pattern for descent. Requires minA for stability and mod safety cues for awareness of foot placement, especially on R. Completed 12 steps total without rest break.  Ambulated ~150f43f ortho rehab gym with minA and no AD with cues for upright posture, forward gaze, and  visual scanning to the R. Once in rehab gym, completed car transfer with minA (pt selecting side stepping method) with car height simulating typical sedan height. Pt scooting over to the driver's seat.   Assisted patient to the Nustep machine with L5 resistance using BUE/BLE. PT returned to car simulator to clean and pt impulsively standing unassisted (other PT's in rehab gym assisting once standing). Pt appeared confused and not wanting to return to Nustep. He began wandering to patients and patient rooms. He began pulling PT's hand away from him, becoming mildly agitated. He was redirected and guided back to his room where his wife was. He was assisted to the TIS w/c with minA. Safety belt alarm on, call bell in reach, wife remaining at bedside.    2nd session: Pt supine in bed with wife at bedside. Pt appears agreeable to therapy session and wanting to get OOB. Pt with posey belt and B mittens - removed at start of session and reapplied at end of session.   Bed mobility completed with supervision and able to scoot himself to EOB without assist. Sit<>Stand with no AD and minA - ambulates to bathroom where pt was continent of bladder and loose BM - charted. Required assist for pericare and LB management. Ambulates to day room rehab gym, ~150ft26fth minA and no AD - tactile and verbal cueing for upright posture and visual scanning to the R. Completed x2 games of horseshoe toss in standing to work on dynamic standing balance - pt appeared to enjoy this activity and was laughing with  missed shots. He was instructed to retrieve horseshoes which he did with minA for picking up object from floor.   Ambulated back to his room, ~178f, with minA and no AD with similar cues as provided above. Assisted to bed with minA and left with wife at bedside. All needs met.      Therapy Documentation Precautions:  Precautions Precautions: Fall Precaution Comments: NG tube, B wrist restraints, pelvic  restraint Restrictions Weight Bearing Restrictions: No General:    Therapy/Group: Individual Therapy  CAlger Simons8/29/2023, 7:33 AM

## 2022-08-18 NOTE — Progress Notes (Signed)
PROGRESS NOTE   Subjective/Complaints: Somnolent this morning, had a restless night, will add melatonin supplement for tonight, plan for MBS today. If fails, wife would like PEG placed  ROS: unable to obtain given mental status.   Objective:   DG Abd 1 View  Result Date: 08/16/2022 CLINICAL DATA:  NG tube placement EXAM: ABDOMEN - 1 VIEW COMPARISON:  None Available. FINDINGS: NG tube tip is in the stomach with side port near the GE junction. No gas-filled dilated loops of bowel seen in the visualized abdomen. Calcifications of the right upper quadrant, likely gallstones. Elevation of the left hemidiaphragm and left basilar atelectasis. IMPRESSION: NG tube tip is in the stomach with side port near the GE junction, recommend advancement for optimal positioning. Electronically Signed   By: Allegra Lai M.D.   On: 08/16/2022 15:35    Recent Labs    08/16/22 1524  WBC 7.5  HGB 13.7  HCT 42.2  PLT 244   Recent Labs    08/16/22 1837  NA 136  K 4.5  CL 105  CO2 25  GLUCOSE 105*  BUN 18  CREATININE 0.78  CALCIUM 8.8*   No intake or output data in the 24 hours ending 08/18/22 1013       Physical Exam: Vital Signs Blood pressure (!) 154/95, pulse 73, temperature 97.9 F (36.6 C), resp. rate 16, height 6' (1.829 m), weight 64.5 kg, SpO2 100 %. Gen: no distress, normal appearing, BMI 19.29, somnolent HEENT: oral mucosa pink and moist, NCAT Cardio: Bradycardic  Chest: CTAB, normal effort, normal rate of breathing Abd: soft, non-distended, +BS Ext: no edema Psych: tearful regarding situation Skin: intact Neurological:     Comments: Patient is alert.  Right facial droop. Globally aphasic.  He would follow some simple motor commands with significant prompting.     Assessment/Plan: 1. Functional deficits which require 3+ hours per day of interdisciplinary therapy in a comprehensive inpatient rehab setting. Physiatrist  is providing close team supervision and 24 hour management of active medical problems listed below. Physiatrist and rehab team continue to assess barriers to discharge/monitor patient progress toward functional and medical goals  Care Tool:  Bathing    Body parts bathed by patient: Chest, Face   Body parts bathed by helper: Right arm, Left arm, Buttocks, Right upper leg, Left upper leg, Right lower leg, Left lower leg, Front perineal area     Bathing assist Assist Level: Total Assistance - Patient < 25%     Upper Body Dressing/Undressing Upper body dressing   What is the patient wearing?: Pull over shirt    Upper body assist Assist Level: Maximal Assistance - Patient 25 - 49%    Lower Body Dressing/Undressing Lower body dressing      What is the patient wearing?: Pants, Incontinence brief     Lower body assist Assist for lower body dressing: Maximal Assistance - Patient 25 - 49%     Toileting Toileting    Toileting assist Assist for toileting: Minimal Assistance - Patient > 75%     Transfers Chair/bed transfer  Transfers assist  Chair/bed transfer activity did not occur: Safety/medical concerns  Chair/bed transfer assist level: Minimal Assistance -  Patient > 75%     Locomotion Ambulation   Ambulation assist   Ambulation activity did not occur: Safety/medical concerns  Assist level: 2 helpers Assistive device: Hand held assist Max distance: 184ft   Walk 10 feet activity   Assist  Walk 10 feet activity did not occur: Safety/medical concerns  Assist level: 2 helpers Assistive device: Hand held assist   Walk 50 feet activity   Assist Walk 50 feet with 2 turns activity did not occur: Safety/medical concerns  Assist level: Minimal Assistance - Patient > 75%      Walk 150 feet activity   Assist Walk 150 feet activity did not occur: Safety/medical concerns  Assist level: Minimal Assistance - Patient > 75% Assistive device: Hand held assist     Walk 10 feet on uneven surface  activity   Assist Walk 10 feet on uneven surfaces activity did not occur: Safety/medical concerns         Wheelchair     Assist Is the patient using a wheelchair?: Yes Type of Wheelchair: Manual Wheelchair activity did not occur: Safety/medical concerns         Wheelchair 50 feet with 2 turns activity    Assist    Wheelchair 50 feet with 2 turns activity did not occur: Safety/medical concerns       Wheelchair 150 feet activity     Assist  Wheelchair 150 feet activity did not occur: Safety/medical concerns       Blood pressure (!) 154/95, pulse 73, temperature 97.9 F (36.6 C), resp. rate 16, height 6' (1.829 m), weight 64.5 kg, SpO2 100 %.  Medical Problem List and Plan: 1. 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  Atrial fibrillation with RVR: Eliquis initiated 08/10/2022. Discussed with team would need to be held prior to PEG placement             -antiplatelet therapy: N/A 3. Pain: n/a 4. 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 Add melatonin 3mg  HS             -antipsychotic agents: Seroquel 25 mg daily with supper and 12.5 mg 3 times daily as needed. 5. Neuropsych/cognition: This patient is not capable of making decisions on his own behalf. 6. Skin/Wound Care: Routine skin checks 7. Fluids/Electrolytes/Nutrition: Routine in and outs with follow-up chemistries 8.  New onset atrial fibrillation RVR/CHF.  Follow-up cardiology service.  Amiodarone as directed, Aldactone 12.5 mg daily. Add magnesium supplement 250mg  gluconate HS. Cardiac rate controlled.  Plan outpatient ischemic work-up after  recovery from CVA 9.  Dysphagia.  NPO.  Alternative means of nutritional support.  Follow-up speech therapy. Discussed eval with SLP today before replacing NGT 10.  Hyperlipidemia.  Continue Lipitor 11. Suboptimal vitamin D level: start 800U daily 12. Hyperglycemia: likely secondary to tube feeds, hemoglobin A1c reviewed and is 4.6, continue to monitor. Does not need CBG checks.  13. Right lower lobe pneumonia: continue Ancef 14. Worsening lethargy: CT head ordered and stable, improved, likely secondary to pneumonia 15. Bradycardia: will reach out to  cardiology to see if Amiodarone can be decreased  -Spoke with cardiology 8/26, will decrease amiodarone to 200daily, appreciate assistance   8/27 Continues to have episodic bradycardia, Cardiology advises as long as HR increased with activity appropriately and no sx HR in 50s, no further action needed 16. UTI: on Ancef, f/u UC- 7000 CFU e cloacae resistant to cefazolin  -Foley draining clear yellow urine, only 7000CFU-likely more contaminant- continue to monitor  -8/27 leukocytosis resolved on CBC today 18. Chronic BPH: flomax restarted HS 19. Depression: started Prozac 10mg  HS  LOS: 6 days A FACE TO FACE EVALUATION WAS PERFORMED  Tylicia Sherman P Tymara Saur 08/18/2022, 10:13 AM

## 2022-08-18 NOTE — Progress Notes (Signed)
Pt continuously attempting to get out of bed through out night. Attempted to reorient multiple times, unsuccessfully. Prior IV ripped out by pt, new one put in, fluids continued. Safety measures in place, pt remains in bed.

## 2022-08-18 NOTE — Progress Notes (Signed)
Speech Language Pathology Daily Session Note  Patient Details  Name: Dustin Blanchard MRN: [id number] Date of Birth: [birth year]  Today's Date: 08/18/2022 SLP Individual Time: 1200-1225  SLP Individual Time Calculation (min): 25 min  Short Term Goals: Week 1: SLP Short Term Goal 1 (Week 1): Pt will maintain alertness for 3 minute intervals given Max A multimodal cues. SLP Short Term Goal 2 (Week 1): Following oral care, pt will participate in CSE and/or therapeutic PO trials of ice chips and tsp sips of water with minimal s/sx c/f laryngeal invasion in preparation for repeat instrumental swallow study. SLP Short Term Goal 3 (Week 1): Pt will follow contextual commands x 5 with Max A multimodal cues. SLP Short Term Goal 4 (Week 1): Pt will answer biographical yes/no questions x 3 given Max A multimodal cues. SLP Short Term Goal 5 (Week 1): Pt will participate in ongoing cognitive-linguistic assessment to further determine ST POC and aid in d/c planning.  Skilled Therapeutic Interventions: S: Pt seen this date for skilled ST intervention targeting deglutition goals outlined above. Pt received awake/alert and finishing OT session; getting back in bed. Wife present for education following MBSS. Pt attempting to get OOB intermittently during today's session; easily redirectable. Agreeable to ST intervention at bedside.  O: SLP facilitated today's session by providing controlled PO trials of puree and nectar-thick liquid consistencies via cup to assess tolerance following MBSS and to provide skilled education to pt's wife re: recommendations and safe swallowing strategies/aspiration precautions. SLP showed pt's wife MBSS video to further explain pt's swallow function, diet recommendations, and elevated aspiration risk, post-prandially, due to pharyngeal residuals; pt's wife verbalized understanding. With Max A for slow administration of boluses and providing extra time between bites, no clinical s/sx  concerning for airway invasion were observed with Dysphagia 1 texture and nectar-thick liquid via cup. Vocal quality clear and dry post-swallow. Provided education to pt's wife re: pt's need for full supervision and assistance for PO intake, and to note any consistent throat clearing and/or vocal quality that may sound wet, as an indication of airway invasion. Of note, pt with minimal intake this session, before declining further trials.  A: Pt appears sitmulable for skilled ST intervention as evident by initiation of PO diet and increased responses to basic yes/no questions via head gestures and verbal approximations. Recommend trialing Dysphagia 1 textures and nectar-thick liquids given adherence to aspiration precautions (upright positioning, small + single bites/sips, slow rate, minimizing distractions). [name] verbalized understanding of education. Contacted MD re: recommendations and to consult RD to insure adequate caloric intake to support fully PO diet. Will plan to observe PO tolerance with AM meal next date; pt's wife aware and in agreement with plan.   P: Pt left in bed with all safety measures activated, posey lap belt on, and wife present. Call bell reviewed and within reach and all immediate needs met. Continue per current ST POC next session.  Pain Pain Assessment Pain Scale: Faces Faces Pain Scale: No hurt  Therapy/Group: Individual Therapy  Icess Bertoni A Estel Tonelli 08/18/2022, 1:28 PM

## 2022-08-18 NOTE — Progress Notes (Signed)
Occupational Therapy Session Note  Patient Details  Name: Dustin Blanchard MRN: [id number] Date of Birth: May 08, 1951  Today's Date: 08/18/2022 OT Individual Time: 1050-1200 OT Individual Time Calculation (min): 70 min    Short Term Goals: Week 1:  OT Short Term Goal 1 (Week 1): Pt will sit EOB with mod a and cues for simple ADL OT Short Term Goal 2 (Week 1): Pt will bathe UB with max a and mod cues OT Short Term Goal 3 (Week 1): Pt will complete simple grooming task with mod cues with 1/3 step initiation OT Short Term Goal 4 (Week 1): Pt will don pull over shirt wiht max A and mod cues using hemi techniques  Skilled Therapeutic Interventions/Progress Updates:  Skilled OT intervention completed with focus on tub transfers and ADL retraining with pt's wife [name] present and incorporated into session for hands on training. Pt received tilted in TIS chair with wife present. Pt remained aphasic/non-verbal during session but did respond to simple phrases with head nods and grunts, no pain indicated during session.   Transported dependently in w/c for time <> ADL bathroom. Discussed with [name] the bathroom set up, with tub/shower being in their home with shower curtain. Education provided on DME available such as tub bench that can be purchased for use at home, suggested use of [facility] for ease of bathing, shower curtain management to prevent water spillage, minimizing stands via lateral leans for pericare, use of grab bars/installation as well as effective safety strategies for exiting the shower to eliminate falls. [name] assisted pt with min A short ambulatory transfer from TIS chair to tub bench then CGA for managing BLE over threshold. Min A sit > stand and ambulatory transfer back to TIS chair.   Pt does consistently require multimodal cueing for command following, benefiting from visual and tactile/hand over hand cueing most during session with [name] educated on different uses and appropriate times for each vs  pulling and pushing pt to direct him. [name] with demonstration of doing for the pt vs allowing for delayed processing, with education also provided regarding letting pt attempt and recognize mistakes vs automatically doing for pt.  Back in room, pt completed all bathing at sink side with min A for buttocks only in stance. Able to reach all regions with time and cueing. Donned deodorant with supervision. UB dressing completed with mod A, with cues needed for orienting shirt 2/2 apraxic motor planning and poor sequencing. Min A LB dressing with use of figure 4 position. Able to donn socks with supervision only with same technique.   Pt became slightly restless and confused towards end of session, with therapist allowing pt to roam in room for ambulatory endurance until ready to return to bed. Required CGA to min A for balance, with directional cues needed for safety. At EOB pt initiated straightening his bed sheets/linens, then mod cues and encouragement needed to sit EOB. Mod A for bed mobility with assist for BLE management 2/2 motor planning.   Pt remained upright in bed, with lap belt donned/activated, wife in room, all needs in reach at end of session.  Therapy Documentation Precautions:  Precautions Precautions: Fall Precaution Comments: NG tube, B wrist restraints, pelvic restraint Restrictions Weight Bearing Restrictions: No    Therapy/Group: Individual Therapy  Melvyn Novas, MS, OTR/L  08/18/2022, 12:14 PM

## 2022-08-19 MED ORDER — ATORVASTATIN CALCIUM 40 MG PO TABS
40.0000 mg | ORAL_TABLET | Freq: Every day | ORAL | Status: DC
  Administered 2022-08-20 – 2022-08-25 (×6): 40 mg via ORAL
  Filled 2022-08-19 (×6): qty 1

## 2022-08-19 MED ORDER — MIRTAZAPINE 15 MG PO TABS
7.5000 mg | ORAL_TABLET | Freq: Every day | ORAL | Status: DC
  Administered 2022-08-19 – 2022-08-24 (×6): 7.5 mg via ORAL
  Filled 2022-08-19 (×6): qty 1

## 2022-08-19 MED ORDER — B COMPLEX-C PO TABS
1.0000 | ORAL_TABLET | Freq: Every day | ORAL | Status: DC
  Administered 2022-08-20 – 2022-08-25 (×6): 1 via ORAL
  Filled 2022-08-19 (×6): qty 1

## 2022-08-19 MED ORDER — SENNOSIDES-DOCUSATE SODIUM 8.6-50 MG PO TABS
1.0000 | ORAL_TABLET | Freq: Two times a day (BID) | ORAL | Status: DC | PRN
  Administered 2022-08-22 – 2022-08-24 (×4): 1 via ORAL
  Filled 2022-08-19 (×4): qty 1

## 2022-08-19 MED ORDER — VALPROIC ACID 250 MG/5ML PO SOLN
250.0000 mg | Freq: Every day | ORAL | Status: DC
  Administered 2022-08-19 – 2022-08-24 (×6): 250 mg via ORAL
  Filled 2022-08-19 (×6): qty 5

## 2022-08-19 MED ORDER — QUETIAPINE FUMARATE 25 MG PO TABS
25.0000 mg | ORAL_TABLET | Freq: Every day | ORAL | Status: DC
  Administered 2022-08-19 – 2022-08-23 (×5): 25 mg via ORAL
  Filled 2022-08-19 (×6): qty 1

## 2022-08-19 MED ORDER — ADULT MULTIVITAMIN W/MINERALS CH
1.0000 | ORAL_TABLET | Freq: Every day | ORAL | Status: DC
Start: 2022-08-20 — End: 2022-08-25
  Administered 2022-08-20 – 2022-08-25 (×6): 1 via ORAL
  Filled 2022-08-19 (×6): qty 1

## 2022-08-19 MED ORDER — AMIODARONE HCL 200 MG PO TABS
200.0000 mg | ORAL_TABLET | Freq: Every day | ORAL | Status: DC
  Administered 2022-08-20 – 2022-08-25 (×6): 200 mg via ORAL
  Filled 2022-08-19 (×6): qty 1

## 2022-08-19 MED ORDER — QUETIAPINE FUMARATE 25 MG PO TABS
12.5000 mg | ORAL_TABLET | Freq: Three times a day (TID) | ORAL | Status: DC | PRN
  Administered 2022-08-19 – 2022-08-24 (×3): 12.5 mg via ORAL
  Filled 2022-08-19 (×2): qty 1

## 2022-08-19 MED ORDER — SPIRONOLACTONE 25 MG PO TABS
25.0000 mg | ORAL_TABLET | Freq: Every day | ORAL | Status: DC
  Administered 2022-08-20 – 2022-08-25 (×6): 25 mg via ORAL
  Filled 2022-08-19 (×6): qty 1

## 2022-08-19 MED ORDER — CHOLECALCIFEROL 10 MCG (400 UNIT) PO TABS
800.0000 [IU] | ORAL_TABLET | Freq: Every day | ORAL | Status: DC
  Administered 2022-08-20 – 2022-08-25 (×6): 800 [IU] via ORAL
  Filled 2022-08-19 (×6): qty 2

## 2022-08-19 MED ORDER — PANTOPRAZOLE 2 MG/ML SUSPENSION
40.0000 mg | Freq: Every day | ORAL | Status: DC
  Administered 2022-08-19 – 2022-08-24 (×6): 40 mg via ORAL
  Filled 2022-08-19 (×6): qty 20

## 2022-08-19 MED ORDER — APIXABAN 5 MG PO TABS
5.0000 mg | ORAL_TABLET | Freq: Two times a day (BID) | ORAL | Status: DC
  Administered 2022-08-19 – 2022-08-25 (×12): 5 mg via ORAL
  Filled 2022-08-19 (×12): qty 1

## 2022-08-19 NOTE — Progress Notes (Signed)
Patient ID: Dustin Blanchard, male   DOB: 1951-03-21, 71 y.o.   MRN: [id number]  Met with pt and wife who is present to update both on team conference goals of supervision-min assist level and target discharge date of 9/5. Pt became tearful when discussing being able to go home soon. Wife has been here and participates in therapies with him which helps him cooperate more with her here. Discussed equipment needs and follow up. Will work on follow up home health and equipment. Wife to look for tub bench on line since not covered item. Continue to work on discharge needs for next Tuesday

## 2022-08-19 NOTE — Progress Notes (Signed)
PROGRESS NOTE   Subjective/Complaints: Educating wife regarding feeding slowly, MaxA for feeding Very alert today Working with PT  ROS: Unable to obtain given mental status.   Objective:   DG Swallowing Func-Speech Pathology  Result Date: 08/18/2022 Table formatting from the original result was not included. Objective Swallowing Evaluation: Type of Study: MBS-Modified Barium Swallow Study  Patient Details Name: Riordan Hiland MRN: [id number] Date of Birth: November 10, 1951 Today's Date: 08/18/2022 Time: SLP Start Time (ACUTE ONLY): [id number] -SLP Stop Time (ACUTE ONLY): 1001 SLP Time Calculation (min) (ACUTE ONLY): 24 min Past Medical History: Past Medical History: Diagnosis Date  Dementia [facility])  Past Surgical History: Past Surgical History: Procedure Laterality Date  HERNIA REPAIR    HIP SURGERY    IR CT HEAD LTD  08/06/2022  IR CT HEAD LTD  08/06/2022  IR PERCUTANEOUS ART THROMBECTOMY/INFUSION INTRACRANIAL INC DIAG ANGIO  08/06/2022  IR US GUIDE VASC ACCESS RIGHT  08/06/2022  RADIOLOGY WITH ANESTHESIA N/A 08/06/2022  Procedure: IR WITH ANESTHESIA;  Surgeon: Julieanne Cotton, MD;  Location: [facility] OR;  Service: Radiology;  Laterality: N/A; HPI: Pt is a 71 y.o. male who presented with right-sided weakness and aphasia. CT head: Density overlying the left cerebral convexity is grossly similar  to the intra procedural images obtained earlier the same day and is  favored to reflect predominantly contrast extravasation; however,  superimposed subarachnoid hemorrhage can not be entirely excluded. Small focus of hypodensity in the left frontal lobe cortex suspicious for small evolving infarct. PMH: hypertension, hyperlipidemia, sinus bradycardia, BPH, vascular dementia (dx in Dec, 2022 per family)  No data recorded  Recommendations for follow up therapy are one component of a multi-disciplinary discharge planning process, led by the attending physician.  Recommendations may  be updated based on patient status, additional functional criteria and insurance authorization. Assessment / Plan / Recommendation   08/18/2022   9:13 PM Clinical Impressions Clinical Impression MBSS completed secondary to hx of silent aspiration with all consistencies following MBSS completed on 08/07/2022. Of note, pt with subtle improvement in swallow function since MBSS completed on 08/07/2022.  Per instrumental findings, pt presents with mild-moderate oropharyngeal dysphagia in the setting of deconditioning, hx of vascular dementia, and recent CVA. Oral phase remarkable for lingual weakness, decreased bolus manipulation + cohesion, delayed oral transit, and premature spillage in the oropharynx prior to swallow initiation. Pharyngeal phase remarkable for delayed swallow initiation, reduced base of tongue approximation to the posterior pharyngeal wall,  decreased pharyngeal drive/peristalsis, diminished hyolaryngeal excursion + epiglottic inversion, and decreased airway closure.  Aforementioned deficits resulted in moderate > mild pharyngeal stasis at the level of the valleculae, along the lateral channels, and within the pyriform sinuses, post-prandially, which did not clear with additional swallows. Pt unable to follow commands to perform secondary swallow nor did he do this reflexively. Intermittent cord-level penetration noted (PAS 4), via the interarytenoid space, with intake nectar-thick liquids via cup and straw. Pt did appeared sensate to penetration and produce throat clear, which appeared to clear penetrated material from laryngeal vestibule. No aspiration resulted from aforementioned penetration. Conversely, deep penetration (PAS 5) was observed with intake of thin liquid via cup and straw, which pt was sensate to,  but unable to clear. No penetration nor aspiration appreciated with puree texture. It should be noted that pt was unable to consistently follow commands to perform postural modifications or  compensatory techniques during today's study.  Given instrumental findings, pt's stable medical status, and improving ambulation, recommend initiation of Dysphagia 1 diet with nectar-thick liquids with close monitoring and strict adherence to aspiration precautions to include fully upright positioning for all PO intake, slow rate, small + single bites/sips, alternating bites/sips, minimizing distractions, and full supervision. Pt and pt's wife education on MBSS findings and recommendations, and are in agreement with proposed plan. Nevertheless, pt remains at an elevated risk of aspiration due to his cognitive-communication deficits and volume of pharyngeal stasis post-swallow. Discussed options and risk/benefits of PO diet vs alternate means of nutrition/hydration. Pt and pt's wife informed of elevated risk for aspiration, though verbalized that she would also like to trial PO diet to assess tolerance. If unable to tolerate safely or if he is unable to maintain his nutrition and hydration via PO alone, would be interested in considering long-term alternate means of nutrition and hydration via PEG. May upgrade diet textures, clinically, as able. However, recommend repeat instrumental study prior to liquid upgrade. May consider initiation of [employer] Protocol between meals, if pt tolerates current diet consistencies. Please see below for additional details.   SLP Visit Diagnosis Dysphagia, oropharyngeal phase (R13.12);Aphasia (R47.01) Impact on safety and function Moderate aspiration risk;Mild aspiration risk;Risk for inadequate nutrition/hydration     08/07/2022   1:12 PM Treatment Recommendations Treatment Recommendations Therapy as outlined in treatment plan below     08/18/2022   9:14 PM Prognosis Prognosis for Safe Diet Advancement Good Barriers to Reach Goals Cognitive deficits;Language deficits;Severity of deficits   08/18/2022   9:13 PM Diet Recommendations SLP Diet Recommendations Dysphagia 1 (Puree)  solids;Nectar thick liquid Liquid Administration via Cup;Straw Medication Administration Crushed with puree Compensations Minimize environmental distractions;Slow rate;Small sips/bites;Follow solids with liquid;Other (Comment) Postural Changes Remain semi-upright after after feeds/meals (Comment);Seated upright at 90 degrees     08/18/2022   9:13 PM Other Recommendations Oral Care Recommendations Oral care BID   08/07/2022   1:12 PM Frequency and Duration  Speech Therapy Frequency (ACUTE ONLY) min 2x/week Treatment Duration 2 weeks     08/18/2022   9:04 PM Oral Phase Oral Phase Impaired Oral - Honey Teaspoon NT Oral - Honey Cup NT Oral - Nectar Teaspoon NT Oral - Nectar Cup Weak lingual manipulation;Delayed oral transit;Decreased bolus cohesion;Premature spillage Oral - Nectar Straw Weak lingual manipulation;Delayed oral transit;Decreased bolus cohesion;Premature spillage Oral - Thin Teaspoon Delayed oral transit;Weak lingual manipulation;Decreased bolus cohesion;Premature spillage Oral - Thin Cup Weak lingual manipulation;Right anterior bolus loss;Delayed oral transit;Decreased bolus cohesion;Premature spillage Oral - Thin Straw Weak lingual manipulation;Right anterior bolus loss;Delayed oral transit;Decreased bolus cohesion;Premature spillage Oral - Puree Weak lingual manipulation;Delayed oral transit;Decreased bolus cohesion;Premature spillage Oral - Mech Soft NT Oral - Regular NT Oral - Multi-Consistency NT Oral - Pill NT    08/18/2022   9:06 PM Pharyngeal Phase Pharyngeal Phase Impaired Pharyngeal- Honey Teaspoon NT Pharyngeal- Honey Cup NT Pharyngeal- Nectar Teaspoon NT Pharyngeal- Nectar Cup Delayed swallow initiation-vallecula;Reduced pharyngeal peristalsis;Reduced tongue base retraction;Pharyngeal residue - valleculae;Pharyngeal residue - pyriform;Lateral channel residue;Inter-arytenoid space residue;Penetration/Apiration after swallow;Reduced epiglottic inversion;Reduced anterior laryngeal mobility;Pharyngeal  residue - posterior pharnyx Pharyngeal Material enters airway, CONTACTS cords and then ejected out Pharyngeal- Nectar Straw Delayed swallow initiation-vallecula;Reduced pharyngeal peristalsis;Reduced tongue base retraction;Penetration/Apiration after swallow;Pharyngeal residue - valleculae;Pharyngeal residue - pyriform;Inter-arytenoid space residue;Lateral channel  residue;Reduced epiglottic inversion;Reduced anterior laryngeal mobility;Pharyngeal residue - posterior pharnyx Pharyngeal Material enters airway, CONTACTS cords and then ejected out Pharyngeal- Thin Teaspoon Delayed swallow initiation-pyriform sinuses;Reduced pharyngeal peristalsis;Reduced epiglottic inversion;Reduced tongue base retraction;Pharyngeal residue - valleculae;Lateral channel residue;Reduced anterior laryngeal mobility;Pharyngeal residue - pyriform;Pharyngeal residue - posterior pharnyx Pharyngeal Material does not enter airway Pharyngeal- Thin Cup Delayed swallow initiation-pyriform sinuses;Reduced pharyngeal peristalsis;Reduced epiglottic inversion;Reduced anterior laryngeal mobility;Reduced airway/laryngeal closure;Reduced tongue base retraction;Penetration/Aspiration during swallow;Pharyngeal residue - pyriform;Pharyngeal residue - valleculae;Lateral channel residue Pharyngeal Material enters airway, remains ABOVE vocal cords and not ejected out Pharyngeal- Thin Straw Delayed swallow initiation-pyriform sinuses;Reduced epiglottic inversion;Reduced airway/laryngeal closure;Reduced anterior laryngeal mobility;Reduced tongue base retraction;Penetration/Aspiration during swallow;Pharyngeal residue - valleculae;Pharyngeal residue - pyriform;Lateral channel residue;Pharyngeal residue - posterior pharnyx Pharyngeal Material enters airway, remains ABOVE vocal cords and not ejected out Pharyngeal- Puree Delayed swallow initiation-vallecula;Reduced pharyngeal peristalsis;Reduced anterior laryngeal mobility;Reduced tongue base retraction;Pharyngeal  residue - valleculae;Pharyngeal residue - pyriform;Pharyngeal residue - posterior pharnyx;Lateral channel residue Pharyngeal Material does not enter airway Pharyngeal- Mechanical Soft NT Pharyngeal- Regular NT Pharyngeal- Multi-consistency NT Pharyngeal- Pill NT    08/18/2022   9:13 PM Cervical Esophageal Phase  Cervical Esophageal Phase [facility] A Lutes 08/18/2022, 9:26 PM                      Recent Labs    08/16/22 1524  WBC 7.5  HGB 13.7  HCT 42.2  PLT 244   Recent Labs    08/16/22 1837  NA 136  K 4.5  CL 105  CO2 25  GLUCOSE 105*  BUN 18  CREATININE 0.78  CALCIUM 8.8*    Intake/Output Summary (Last 24 hours) at 08/19/2022 1123 Last data filed at 08/19/2022 0730 Gross per 24 hour  Intake 20 ml  Output --  Net 20 ml         Physical Exam: Vital Signs Blood pressure 109/65, pulse (!) 54, temperature 97.7 F (36.5 C), resp. rate 16, height 6' (1.829 m), weight 64.1 kg, SpO2 98 %. Gen: no distress, normal appearing, BMI 19.29, somnolent HEENT: oral mucosa pink and moist, NCAT Cardio: Bradycardic  Chest: CTAB, normal effort, normal rate of breathing Abd: soft, non-distended, +BS Ext: no edema Psych: tearful regarding situation Skin: intact Neurological:     Comments: Patient is alert.  Right facial droop. Globally aphasic.  He would follow some simple motor commands with significant prompting.  Severe right sided inattention   Assessment/Plan: 1. Functional deficits which require 3+ hours per day of interdisciplinary therapy in a comprehensive inpatient rehab setting. Physiatrist is providing close team supervision and 24 hour management of active medical problems listed below. Physiatrist and rehab team continue to assess barriers to discharge/monitor patient progress toward functional and medical goals  Care Tool:  Bathing    Body parts bathed by patient: Right arm, Left arm, Chest, Abdomen, Front perineal area, Right upper leg, Left upper leg, Right lower  leg, Left lower leg, Face   Body parts bathed by helper: Buttocks     Bathing assist Assist Level: Minimal Assistance - Patient > 75%     Upper Body Dressing/Undressing Upper body dressing   What is the patient wearing?: Pull over shirt    Upper body assist Assist Level: Moderate Assistance - Patient 50 - 74%    Lower Body Dressing/Undressing Lower body dressing      What is the patient wearing?: Pants, Incontinence brief     Lower body assist Assist for lower body dressing: Minimal Assistance -  Patient > 75%     Editor, commissioning assist Assist for toileting: Minimal Assistance - Patient > 75%     Transfers Chair/bed transfer  Transfers assist  Chair/bed transfer activity did not occur: Safety/medical concerns  Chair/bed transfer assist level: Minimal Assistance - Patient > 75%     Locomotion Ambulation   Ambulation assist   Ambulation activity did not occur: Safety/medical concerns  Assist level: 2 helpers Assistive device: Hand held assist Max distance: 148ft   Walk 10 feet activity   Assist  Walk 10 feet activity did not occur: Safety/medical concerns  Assist level: 2 helpers Assistive device: Hand held assist   Walk 50 feet activity   Assist Walk 50 feet with 2 turns activity did not occur: Safety/medical concerns  Assist level: Minimal Assistance - Patient > 75%      Walk 150 feet activity   Assist Walk 150 feet activity did not occur: Safety/medical concerns  Assist level: Minimal Assistance - Patient > 75% Assistive device: Hand held assist    Walk 10 feet on uneven surface  activity   Assist Walk 10 feet on uneven surfaces activity did not occur: Safety/medical concerns         Wheelchair     Assist Is the patient using a wheelchair?: Yes Type of Wheelchair: Manual Wheelchair activity did not occur: Safety/medical concerns         Wheelchair 50 feet with 2 turns activity    Assist     Wheelchair 50 feet with 2 turns activity did not occur: Safety/medical concerns       Wheelchair 150 feet activity     Assist  Wheelchair 150 feet activity did not occur: Safety/medical concerns       Blood pressure 109/65, pulse (!) 54, temperature 97.7 F (36.5 C), resp. rate 16, height 6' (1.829 m), weight 64.1 kg, SpO2 98 %.  Medical Problem List and Plan: 1. 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  Atrial fibrillation with RVR: Eliquis initiated 08/10/2022. Continue amiodarone 200mg  daily             -antiplatelet therapy: N/A 3. Pain: n/a 4. 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 Add melatonin 3mg  HS             -antipsychotic agents: Seroquel 25 mg daily with supper and 12.5 mg 3 times daily as needed. 5. Neuropsych/cognition: This patient is not capable of making decisions on his own behalf. 6. Skin/Wound Care: Routine skin checks 7. Fluids/Electrolytes/Nutrition: Routine in and outs with follow-up chemistries 8.  New onset atrial fibrillation RVR/CHF.  Follow-up cardiology service.  Amiodarone as directed, Aldactone 12.5 mg daily. Add magnesium supplement 250mg  gluconate HS. Cardiac rate controlled.  Plan outpatient ischemic work-up after recovery from CVA 9.  Dysphagia.  NPO.  Alternative means of nutritional support.  Follow-up speech therapy. Discussed eval with SLP today before replacing NGT 10.  Hyperlipidemia.  Continue Lipitor 11. Suboptimal vitamin D level: start 800U daily  12. Hyperglycemia: likely secondary to tube feeds, hemoglobin A1c reviewed and is 4.6, continue to monitor. Does not need  CBG checks.  13. Right lower lobe pneumonia: continue Ancef 14. Worsening lethargy: CT head ordered and stable, improved, likely secondary to pneumonia 15. Bradycardia: will reach out to cardiology to see if Amiodarone can be decreased  -Spoke with cardiology 8/26, will decrease amiodarone to 200daily, appreciate assistance   8/27 Continues to have episodic bradycardia, Cardiology advises as long as HR increased with activity appropriately and no sx HR in 50s, no further action needed 16. UTI: on Ancef, f/u UC- 7000 CFU e cloacae resistant to cefazolin  -Foley draining clear yellow urine, only 7000CFU-likely more contaminant- continue to monitor  -8/27 leukocytosis resolved on CBC today 18. Chronic BPH: flomax restarted HS 19. Depression: replace Prozac with Remeron to help stimulate appetite 20. Decreased appetite: start Remeron 7.5mg  HS  LOS: 7 days A FACE TO FACE EVALUATION WAS PERFORMED  Drema Pry Kahne Helfand 08/19/2022, 11:23 AM

## 2022-08-19 NOTE — Progress Notes (Signed)
Physical Therapy Session Note  Patient Details  Name: Dustin Blanchard MRN: [id number] Date of Birth: Oct 31, 1951  Today's Date: 08/19/2022 PT Individual Time: 1135-1200 PT Individual Time Calculation (min): 25 min   Short Term Goals: Week 1:  PT Short Term Goal 1 (Week 1): Pt will complete bed mobility with maxA PT Short Term Goal 2 (Week 1): Pt will complete bed<>chair transfers with maxA and LRAD PT Short Term Goal 3 (Week 1): Pt will ambulate 67f with maxA and LRAD PT Short Term Goal 4 (Week 1): Pt will initiate stair training  Skilled Therapeutic Interventions/Progress Updates:      Pt sitting in TIS w/c with his wife next to him, listening to music. Pt agreeable to therapy without reports of pain. Discussed DC date that was set during team meeting - wife understandable and content with date. Wife confirms that B hand rails have been installed for home entrance that has 4 steps to enter.   Sit<>stand with no AD from TIS w/c with CGA/minA for initiation. Ambulated to main rehab gym ~1526fwith no AD and CGA/minA - verbal and visual cues for attending to his R throughout. Also attempted to initiate increased trunk extension but pt rigid and suspect he may have had this posture PTA.   In rehab gym, continued to work on visual scanning to his R with colored targets that were placed on mirror however pt with limited sustained/selective attention and difficult to complete. Downgraded task to 1 target and worked on visual tracking - difficult to discern significant R inattention vs R visual field cut - will continue to assess functionally.   Ambulated back to his room in similar manner as above. Remained seated in TIS w/c with safety belt alarm on, wife at bedside. All needs met.    Therapy Documentation Precautions:  Precautions Precautions: Fall Precaution Comments: NG tube, B wrist restraints, pelvic restraint Restrictions Weight Bearing Restrictions: No General:     Therapy/Group:  Individual Therapy  ChAlger Simons/30/2023, 7:47 AM

## 2022-08-19 NOTE — Progress Notes (Addendum)
Nutrition Follow-up  DOCUMENTATION CODES:   Not applicable  INTERVENTION:   Mighty Shake TID with meals, each supplement provides 330 kcals and 9 grams of protein Continue Multivitamin w/ minerals daily Feeding assist with all meals   NUTRITION DIAGNOSIS:   Inadequate oral intake related to inability to eat as evidenced by NPO status. - Progressing, now on diet   GOAL:   Patient will meet greater than or equal to 90% of their needs - Ongoing  MONITOR:   PO intake, Supplement acceptance, Labs, I & O's  REASON FOR ASSESSMENT:   Consult Enteral/tube feeding initiation and management, Assessment of nutrition requirement/status  ASSESSMENT:   71 y.o. male admitted to CIR after L MCA stroke s/p thrombectomy. PMH Includes dementia, CHF, HTN, and HLD.   8/23 - NGT placed 8/27 - NGT replaced 8/28 - NGT removed 8/29 - MBS, recommend Dysphagia 1 and Nectar Liquids; diet advanced  Pt up in wheelchair, wife at bedside. Reports that he has been eating some. Denies any nausea or vomiting. RD observed pt lunch tray, <25% of tray consumed. RD discussed the use of ONS to assist with pt PO intake; wife agreeable. Wife asking about diet once going home, RD discussed that it will be an ongoing discussion and will be talked about closer to discharge as pt diet may change.  Meal completions: 25% x 2 meals  Medications reviewed and include: B complex w/ Vitamin C, Vitamin D3, Magonate, Melatonin, MVI, Protonix, Spironolactone Labs reviewed.   NUTRITION - FOCUSED PHYSICAL EXAM:   Diet Order:   Diet Order             DIET - DYS 1 Room service appropriate? Yes; Fluid consistency: Nectar Thick  Diet effective now                   EDUCATION NEEDS:   Education needs have been addressed  Skin:  Skin Assessment: Reviewed RN Assessment  Last BM:  8/29  Height:   Ht Readings from Last 1 Encounters:  08/12/22 6' (1.829 m)    Weight:   Wt Readings from Last 1 Encounters:   08/19/22 64.1 kg    Ideal Body Weight:  80.9 kg  BMI:  Body mass index is 19.17 kg/m.  Estimated Nutritional Needs:   Kcal:  1900-2100  Protein:  95-110 grams  Fluid:  >/= 1.9 L    Kirby Crigler RD, LDN Clinical Dietitian See [facility] for contact information.

## 2022-08-19 NOTE — Discharge Summary (Signed)
Physician Discharge Summary  Patient ID: Dustin Blanchard MRN: [id number] DOB/AGE: 1951/12/13 71 y.o.  Admit date: 08/12/2022 Discharge date: 08/25/2022  Discharge Diagnoses:  Principal Problem:   Left middle cerebral artery stroke [facility]) Active Problems:   Acute ischemic left MCA stroke (HCC)   Pneumonia of right lower lobe due to infectious organism Atrial fibrillation Vascular dementia Dysphagia Hyperlipidemia Chronic BPH Decreased nutritional storage Tobacco use Right lower lobe pneumonia  Discharged Condition: Stable  Significant Diagnostic Studies: DG Swallowing Func-Speech Pathology  Result Date: 08/18/2022 Table formatting from the original result was not included. Objective Swallowing Evaluation: Type of Study: MBS-Modified Barium Swallow Study  Patient Details Name: Dustin Blanchard MRN: [id number] Date of Birth: 07-24-1951 Today's Date: 08/18/2022 Time: SLP Start Time (ACUTE ONLY): [phone] -SLP Stop Time (ACUTE ONLY): 1001 SLP Time Calculation (min) (ACUTE ONLY): 24 min Past Medical History: Past Medical History: Diagnosis Date  Dementia [facility])  Past Surgical History: Past Surgical History: Procedure Laterality Date  HERNIA REPAIR    HIP SURGERY    IR CT HEAD LTD  08/06/2022  IR CT HEAD LTD  08/06/2022  IR PERCUTANEOUS ART THROMBECTOMY/INFUSION INTRACRANIAL INC DIAG ANGIO  08/06/2022  IR US GUIDE VASC ACCESS RIGHT  08/06/2022  RADIOLOGY WITH ANESTHESIA N/A 08/06/2022  Procedure: IR WITH ANESTHESIA;  Surgeon: Luanne Bras, MD;  Location: [place];  Service: Radiology;  Laterality: N/A; HPI: Pt is a 71 y.o. male who presented with right-sided weakness and aphasia. CT head: Density overlying the left cerebral convexity is grossly similar  to the intra procedural images obtained earlier the same day and is  favored to reflect predominantly contrast extravasation; however,  superimposed subarachnoid hemorrhage can not be entirely excluded. Small focus of hypodensity in the left frontal lobe cortex  suspicious for small evolving infarct. PMH: hypertension, hyperlipidemia, sinus bradycardia, BPH, vascular dementia (dx in Dec, 2022 per family)  No data recorded  Recommendations for follow up therapy are one component of a multi-disciplinary discharge planning process, led by the attending physician.  Recommendations may be updated based on patient status, additional functional criteria and insurance authorization. Assessment / Plan / Recommendation   08/18/2022   9:13 PM Clinical Impressions Clinical Impression MBSS completed secondary to hx of silent aspiration with all consistencies following MBSS completed on 08/07/2022. Of note, pt with subtle improvement in swallow function since MBSS completed on 08/07/2022.  Per instrumental findings, pt presents with mild-moderate oropharyngeal dysphagia in the setting of deconditioning, hx of vascular dementia, and recent CVA. Oral phase remarkable for lingual weakness, decreased bolus manipulation + cohesion, delayed oral transit, and premature spillage in the oropharynx prior to swallow initiation. Pharyngeal phase remarkable for delayed swallow initiation, reduced base of tongue approximation to the posterior pharyngeal wall,  decreased pharyngeal drive/peristalsis, diminished hyolaryngeal excursion + epiglottic inversion, and decreased airway closure.  Aforementioned deficits resulted in moderate > mild pharyngeal stasis at the level of the valleculae, along the lateral channels, and within the pyriform sinuses, post-prandially, which did not clear with additional swallows. Pt unable to follow commands to perform secondary swallow nor did he do this reflexively. Intermittent cord-level penetration noted (PAS 4), via the interarytenoid space, with intake nectar-thick liquids via cup and straw. Pt did appeared sensate to penetration and produce throat clear, which appeared to clear penetrated material from laryngeal vestibule. No aspiration resulted from aforementioned  penetration. Conversely, deep penetration (PAS 5) was observed with intake of thin liquid via cup and straw, which pt was sensate to, but unable to clear. No  penetration nor aspiration appreciated with puree texture. It should be noted that pt was unable to consistently follow commands to perform postural modifications or compensatory techniques during today's study.  Given instrumental findings, pt's stable medical status, and improving ambulation, recommend initiation of Dysphagia 1 diet with nectar-thick liquids with close monitoring and strict adherence to aspiration precautions to include fully upright positioning for all PO intake, slow rate, small + single bites/sips, alternating bites/sips, minimizing distractions, and full supervision. Pt and pt's wife education on MBSS findings and recommendations, and are in agreement with proposed plan. Nevertheless, pt remains at an elevated risk of aspiration due to his cognitive-communication deficits and volume of pharyngeal stasis post-swallow. Discussed options and risk/benefits of PO diet vs alternate means of nutrition/hydration. Pt and pt's wife informed of elevated risk for aspiration, though verbalized that she would also like to trial PO diet to assess tolerance. If unable to tolerate safely or if he is unable to maintain his nutrition and hydration via PO alone, would be interested in considering long-term alternate means of nutrition and hydration via PEG. May upgrade diet textures, clinically, as able. However, recommend repeat instrumental study prior to liquid upgrade. May consider initiation of [employer] Protocol between meals, if pt tolerates current diet consistencies. Please see below for additional details.   SLP Visit Diagnosis Dysphagia, oropharyngeal phase (R13.12);Aphasia (R47.01) Impact on safety and function Moderate aspiration risk;Mild aspiration risk;Risk for inadequate nutrition/hydration     08/07/2022   1:12 PM Treatment  Recommendations Treatment Recommendations Therapy as outlined in treatment plan below     08/18/2022   9:14 PM Prognosis Prognosis for Safe Diet Advancement Good Barriers to Reach Goals Cognitive deficits;Language deficits;Severity of deficits   08/18/2022   9:13 PM Diet Recommendations SLP Diet Recommendations Dysphagia 1 (Puree) solids;Nectar thick liquid Liquid Administration via Cup;Straw Medication Administration Crushed with puree Compensations Minimize environmental distractions;Slow rate;Small sips/bites;Follow solids with liquid;Other (Comment) Postural Changes Remain semi-upright after after feeds/meals (Comment);Seated upright at 90 degrees     08/18/2022   9:13 PM Other Recommendations Oral Care Recommendations Oral care BID   08/07/2022   1:12 PM Frequency and Duration  Speech Therapy Frequency (ACUTE ONLY) min 2x/week Treatment Duration 2 weeks     08/18/2022   9:04 PM Oral Phase Oral Phase Impaired Oral - Honey Teaspoon NT Oral - Honey Cup NT Oral - Nectar Teaspoon NT Oral - Nectar Cup Weak lingual manipulation;Delayed oral transit;Decreased bolus cohesion;Premature spillage Oral - Nectar Straw Weak lingual manipulation;Delayed oral transit;Decreased bolus cohesion;Premature spillage Oral - Thin Teaspoon Delayed oral transit;Weak lingual manipulation;Decreased bolus cohesion;Premature spillage Oral - Thin Cup Weak lingual manipulation;Right anterior bolus loss;Delayed oral transit;Decreased bolus cohesion;Premature spillage Oral - Thin Straw Weak lingual manipulation;Right anterior bolus loss;Delayed oral transit;Decreased bolus cohesion;Premature spillage Oral - Puree Weak lingual manipulation;Delayed oral transit;Decreased bolus cohesion;Premature spillage Oral - Mech Soft NT Oral - Regular NT Oral - Multi-Consistency NT Oral - Pill NT    08/18/2022   9:06 PM Pharyngeal Phase Pharyngeal Phase Impaired Pharyngeal- Honey Teaspoon NT Pharyngeal- Honey Cup NT Pharyngeal- Nectar Teaspoon NT Pharyngeal- Nectar  Cup Delayed swallow initiation-vallecula;Reduced pharyngeal peristalsis;Reduced tongue base retraction;Pharyngeal residue - valleculae;Pharyngeal residue - pyriform;Lateral channel residue;Inter-arytenoid space residue;Penetration/Apiration after swallow;Reduced epiglottic inversion;Reduced anterior laryngeal mobility;Pharyngeal residue - posterior pharnyx Pharyngeal Material enters airway, CONTACTS cords and then ejected out Pharyngeal- Nectar Straw Delayed swallow initiation-vallecula;Reduced pharyngeal peristalsis;Reduced tongue base retraction;Penetration/Apiration after swallow;Pharyngeal residue - valleculae;Pharyngeal residue - pyriform;Inter-arytenoid space residue;Lateral channel residue;Reduced epiglottic inversion;Reduced anterior laryngeal mobility;Pharyngeal  residue - posterior pharnyx Pharyngeal Material enters airway, CONTACTS cords and then ejected out Pharyngeal- Thin Teaspoon Delayed swallow initiation-pyriform sinuses;Reduced pharyngeal peristalsis;Reduced epiglottic inversion;Reduced tongue base retraction;Pharyngeal residue - valleculae;Lateral channel residue;Reduced anterior laryngeal mobility;Pharyngeal residue - pyriform;Pharyngeal residue - posterior pharnyx Pharyngeal Material does not enter airway Pharyngeal- Thin Cup Delayed swallow initiation-pyriform sinuses;Reduced pharyngeal peristalsis;Reduced epiglottic inversion;Reduced anterior laryngeal mobility;Reduced airway/laryngeal closure;Reduced tongue base retraction;Penetration/Aspiration during swallow;Pharyngeal residue - pyriform;Pharyngeal residue - valleculae;Lateral channel residue Pharyngeal Material enters airway, remains ABOVE vocal cords and not ejected out Pharyngeal- Thin Straw Delayed swallow initiation-pyriform sinuses;Reduced epiglottic inversion;Reduced airway/laryngeal closure;Reduced anterior laryngeal mobility;Reduced tongue base retraction;Penetration/Aspiration during swallow;Pharyngeal residue -  valleculae;Pharyngeal residue - pyriform;Lateral channel residue;Pharyngeal residue - posterior pharnyx Pharyngeal Material enters airway, remains ABOVE vocal cords and not ejected out Pharyngeal- Puree Delayed swallow initiation-vallecula;Reduced pharyngeal peristalsis;Reduced anterior laryngeal mobility;Reduced tongue base retraction;Pharyngeal residue - valleculae;Pharyngeal residue - pyriform;Pharyngeal residue - posterior pharnyx;Lateral channel residue Pharyngeal Material does not enter airway Pharyngeal- Mechanical Soft NT Pharyngeal- Regular NT Pharyngeal- Multi-consistency NT Pharyngeal- Pill NT    08/18/2022   9:13 PM Cervical Esophageal Phase  Cervical Esophageal Phase [facility] A [facility] 08/18/2022, 9:26 PM                     DG Abd 1 View  Result Date: 08/16/2022 CLINICAL DATA:  NG tube placement EXAM: ABDOMEN - 1 VIEW COMPARISON:  None Available. FINDINGS: NG tube tip is in the stomach with side port near the GE junction. No gas-filled dilated loops of bowel seen in the visualized abdomen. Calcifications of the right upper quadrant, likely gallstones. Elevation of the left hemidiaphragm and left basilar atelectasis. IMPRESSION: NG tube tip is in the stomach with side port near the GE junction, recommend advancement for optimal positioning. Electronically Signed   By: Yetta Glassman M.D.   On: 08/16/2022 15:35   DG Abd 1 View  Result Date: 08/15/2022 CLINICAL DATA:  Evaluate feeding tube placement EXAM: ABDOMEN - 1 VIEW COMPARISON:  None Available. FINDINGS: The NG/OG tube has been advanced in the interval. The side port is in the distal stomach/antral region. The distal tip is likely either at or just beyond the pylorus. No other changes. IMPRESSION: The NG/OG tube is been advanced with the side port in the distal body/antral region and the distal tip at or just beyond the pylorus. Electronically Signed   By: Dorise Bullion III M.D.   On: 08/15/2022 08:31   DG Abd 1 View  Result Date:  08/15/2022 CLINICAL DATA:  Evaluate NG tube EXAM: ABDOMEN - 1 VIEW COMPARISON:  August 15, 2022 at 3:01 a.m. FINDINGS: The NG tube terminates in the stomach. Both the side port and distal tip are in the stomach. The distal tip is likely in the distal gastric body. No other acute abnormalities. IMPRESSION: The NG tube appears to be in good position as above. Electronically Signed   By: Dorise Bullion III M.D.   On: 08/15/2022 08:30   DG Abd 1 View  Result Date: 08/15/2022 CLINICAL DATA:  Check gastric catheter placement EXAM: ABDOMEN - 1 VIEW COMPARISON:  None Available. FINDINGS: Gastric catheter is noted with the tip in the stomach. Proximal side hole however lies in the distal esophagus. This should be advanced several cm deeper into the stomach. IMPRESSION: Gastric catheter as described. This should be advanced several cm deeper into the stomach. Electronically Signed   By: Inez Catalina M.D.   On: 08/15/2022 03:22   CT HEAD  WO CONTRAST (5MM)  Result Date: 08/14/2022 CLINICAL DATA:  Mental status change. EXAM: CT HEAD WITHOUT CONTRAST TECHNIQUE: Contiguous axial images were obtained from the base of the skull through the vertex without intravenous contrast. RADIATION DOSE REDUCTION: This exam was performed according to the departmental dose-optimization program which includes automated exposure control, adjustment of the mA and/or kV according to patient size and/or use of iterative reconstruction technique. COMPARISON:  CT head and brain MRI 08/06/2022 FINDINGS: Brain: There is a small focus of acute to early subacute intraparenchymal hemorrhage in the left external capsule measuring up to 1.2 cm AP x 0.7 cm TV by 1.5 cm cc, similar in size to the prior brain MRI allowing for differences in modality. There is mild surrounding edema but no mass effect. Previously seen hyperdensity overlying the left cerebral convexity on the prior head CT has resolved. There is no new extra-axial blood. There is  hypodensity in the left cerebral hemisphere primarily involving the frontal operculum consistent with evolving ischemia as seen on prior MRI. There is no new acute large vessel territorial infarct. Background parenchymal volume is stable. The ventricles are stable in size. A remote infarct in the left occipital lobe is unchanged. There is no midline shift. Vascular: There is calcification of the bilateral cavernous ICAs. Skull: Normal. Negative for fracture or focal lesion. Sinuses/Orbits: The paranasal sinuses are clear. The globes and orbits are unremarkable. Other: A nasoenteric catheter is partially imaged. IMPRESSION: 1. Small focus of acute to early subacute intraparenchymal blood in the left external capsule, unchanged in size compared to the MRI from 08/06/2022 and without significant mass effect. 2. Resolved hyperdensity overlying the left cerebral convexity which likely reflected primarily extravasated contrast. 3. Evolving infarcts in the left MCA distribution. 4. No new extra-axial blood or new acute territorial infarct. Electronically Signed   By: Valetta Mole M.D.   On: 08/14/2022 08:12   DG CHEST PORT 1 VIEW  Result Date: 08/13/2022 CLINICAL DATA:  Fever EXAM: PORTABLE CHEST 1 VIEW COMPARISON:  08/13/2022 FINDINGS: Esophageal tube tip in the stomach. Low lung volumes. Elevation of right diaphragm. Streaky right basilar opacity. Cardiomegaly. IMPRESSION: Elevated right diaphragm with right basilar streaky atelectasis or mild pneumonia. Cardiomegaly Electronically Signed   By: Donavan Foil M.D.   On: 08/13/2022 23:43   DG Chest 2 View  Result Date: 08/13/2022 CLINICAL DATA:  Chest crackles. EXAM: CHEST - 2 VIEW COMPARISON:  None Available. FINDINGS: Cardiac enlargement. Vascularity normal. Negative for heart failure. Right lower lobe airspace disease with elevated right hemidiaphragm. This may be atelectasis or pneumonia. No pleural effusion. Left lung clear NG tube in the stomach. IMPRESSION:  Elevated right hemidiaphragm. Right lower lobe atelectasis or pneumonia. NG tube in the stomach. Electronically Signed   By: Franchot Gallo M.D.   On: 08/13/2022 19:03   DG Abd Portable 1V  Result Date: 08/13/2022 CLINICAL DATA:  NG placement. EXAM: PORTABLE ABDOMEN - 1 VIEW COMPARISON:  Chest radiograph dated 08/12/2022. FINDINGS: Enteric tube with tip and side-port in the left upper abdomen, in the body of the stomach. Mild cardiomegaly. No acute osseous pathology. IMPRESSION: Enteric tube with tip and side-port in the body of the stomach. Electronically Signed   By: Anner Crete M.D.   On: 08/13/2022 00:17   DG Abd Portable 1V  Result Date: 08/12/2022 CLINICAL DATA:  Feeding tube placement EXAM: PORTABLE ABDOMEN - 1 VIEW COMPARISON:  08/10/2022 FINDINGS: Limited radiograph of the lower chest and upper abdomen was obtained for the purposes  of enteric tube localization. Enteric tube is seen coursing below the diaphragm with distal tip terminating within the upper chest approximately 19 cm above the level of the GE junction. IMPRESSION: Enteric tube tip terminating within the upper chest centrally, approximately 19 cm above the level of the GE junction. Recommend advancing into the stomach. As the tube projects over the tracheal air column, endotracheal placement of the feeding tube is not excluded. Electronically Signed   By: Davina Poke D.O.   On: 08/12/2022 16:42   DG Abd Portable 1V  Result Date: 08/10/2022 CLINICAL DATA:  Is a gastric tube placement. EXAM: PORTABLE ABDOMEN - 1 VIEW COMPARISON:  08/07/2022. FINDINGS: Feeding tube terminates in the distal gastric antrum. There is retained contrast in the colon. Visualized lung bases are grossly clear. IMPRESSION: Feeding tube terminates in the distal gastric antrum. Electronically Signed   By: Lorin Picket M.D.   On: 08/10/2022 14:18   DG Swallowing Func-Speech Pathology  Result Date: 08/07/2022 Table formatting from the original  result was not included. Objective Swallowing Evaluation: Type of Study: MBS-Modified Barium Swallow Study  Patient Details Name: Dustin Blanchard MRN: [id number] Date of Birth: August 11, 1951 Today's Date: 08/07/2022 Time: SLP Start Time (ACUTE ONLY): 1312 -SLP Stop Time (ACUTE ONLY): 1334 SLP Time Calculation (min) (ACUTE ONLY): 22 min Past Medical History: Past Medical History: Diagnosis Date  Dementia [facility])  Past Surgical History: Past Surgical History: Procedure Laterality Date  HERNIA REPAIR    HIP SURGERY    IR CT HEAD LTD  08/06/2022  IR CT HEAD LTD  08/06/2022  IR PERCUTANEOUS ART THROMBECTOMY/INFUSION INTRACRANIAL INC DIAG ANGIO  08/06/2022  IR US GUIDE VASC ACCESS RIGHT  08/06/2022  RADIOLOGY WITH ANESTHESIA N/A 08/06/2022  Procedure: IR WITH ANESTHESIA;  Surgeon: Luanne Bras, MD;  Location: [place];  Service: Radiology;  Laterality: N/A; HPI: Pt is a 71 y.o. male who presented with right-sided weakness and aphasia. CT head: Density overlying the left cerebral convexity is grossly similar  to the intra procedural images obtained earlier the same day and is  favored to reflect predominantly contrast extravasation; however,  superimposed subarachnoid hemorrhage can not be entirely excluded. Small focus of hypodensity in the left frontal lobe cortex suspicious for small evolving infarct. PMH: hypertension, hyperlipidemia, sinus bradycardia, BPH, vascular dementia (dx in Dec, 2022 per family)  No data recorded  Recommendations for follow up therapy are one component of a multi-disciplinary discharge planning process, led by the attending physician.  Recommendations may be updated based on patient status, additional functional criteria and insurance authorization. Assessment / Plan / Recommendation   08/07/2022   1:12 PM Clinical Impressions Clinical Impression Pt presents with oropharyngeal dysphagia characterized by weak lingual manipulation, a pharyngeal delay, and reduction in bolus cohesion, tongue base retraction,  anterior laryngeal movement, epiglottic inversion, and pharyngeal constriction. He exhibited premature spillage to the pyriform sinuses, vallecular residue, pyriform sinus residue, and reduced laryngeal closure. He demonstrated penetration (PAS 3, 5) and/or silent aspiration (PAS 8) across consistencies. Laryngeal invasion was secondary to the pharyngeal delay, reduced laryngeal closure, and residue in the pyriform sinuses. Episodes of aspiration intermittently triggered subtle throat clearing, but no instances resulted in coughing, and throat clearing was ineffective in expelling material. More viscous boluses (i.e., thickened liquids/solids) reduced the immediacy of aspiration, but aspiration was still noted from residue. Multiple secondary swallows reduced pharyngeal residue, but not to a functional level. It is recommended that the pt's NPO status be maintained and that short-term non-oral alimentation (e.g.,  cortrak) be initiated. Pt may have limited (i.e., ~4-5/hour) individual ice chips after thorough oral care and SLP will follow for dysphagia treatment. SLP Visit Diagnosis Dysphagia, oropharyngeal phase (R13.12) Impact on safety and function Moderate aspiration risk;Severe aspiration risk     08/07/2022   1:12 PM Treatment Recommendations Treatment Recommendations Therapy as outlined in treatment plan below     08/07/2022   1:12 PM Prognosis Prognosis for Safe Diet Advancement Good Barriers to Reach Goals Cognitive deficits;Language deficits;Severity of deficits   08/07/2022   1:12 PM Diet Recommendations SLP Diet Recommendations NPO;Ice chips PRN after oral care Medication Administration Via alternative means     08/07/2022   1:12 PM Other Recommendations Oral Care Recommendations Oral care QID;Staff/trained caregiver to provide oral care Follow Up Recommendations Acute inpatient rehab (3hours/day) Assistance recommended at discharge Frequent or constant Supervision/Assistance Functional Status Assessment  Patient has had a recent decline in their functional status and demonstrates the ability to make significant improvements in function in a reasonable and predictable amount of time.   08/07/2022   1:12 PM Frequency and Duration  Speech Therapy Frequency (ACUTE ONLY) min 2x/week Treatment Duration 2 weeks     08/07/2022   1:12 PM Oral Phase Oral Phase Impaired Oral - Honey Teaspoon Lingual/palatal residue;Weak lingual manipulation;Delayed oral transit;Decreased bolus cohesion;Premature spillage Oral - Honey Cup Lingual/palatal residue;Weak lingual manipulation;Delayed oral transit;Decreased bolus cohesion;Premature spillage Oral - Nectar Cup Lingual/palatal residue;Weak lingual manipulation;Delayed oral transit;Decreased bolus cohesion;Premature spillage Oral - Nectar Straw Lingual/palatal residue;Weak lingual manipulation;Delayed oral transit;Premature spillage;Decreased bolus cohesion Oral - Thin Cup Lingual/palatal residue;Weak lingual manipulation;Right anterior bolus loss;Delayed oral transit;Decreased bolus cohesion;Premature spillage Oral - Thin Straw Lingual/palatal residue;Weak lingual manipulation;Right anterior bolus loss;Premature spillage;Decreased bolus cohesion;Delayed oral transit Oral - Puree Lingual/palatal residue;Weak lingual manipulation;Decreased bolus cohesion;Premature spillage Oral - Mech Soft Lingual/palatal residue;Weak lingual manipulation;Premature spillage;Decreased bolus cohesion;Delayed oral transit;Impaired mastication    08/07/2022   1:12 PM Pharyngeal Phase Pharyngeal Phase Impaired Pharyngeal- Honey Teaspoon Reduced pharyngeal peristalsis;Reduced epiglottic inversion;Delayed swallow initiation-pyriform sinuses;Reduced anterior laryngeal mobility;Reduced airway/laryngeal closure;Reduced tongue base retraction;Penetration/Apiration after swallow;Trace aspiration;Pharyngeal residue - valleculae;Pharyngeal residue - pyriform;Pharyngeal residue - posterior pharnyx Pharyngeal Material  enters airway, passes BELOW cords without attempt by patient to eject out (silent aspiration);Material enters airway, CONTACTS cords and not ejected out Pharyngeal- Honey Cup Reduced pharyngeal peristalsis;Reduced epiglottic inversion;Delayed swallow initiation-pyriform sinuses;Reduced anterior laryngeal mobility;Reduced airway/laryngeal closure;Reduced tongue base retraction;Penetration/Apiration after swallow;Trace aspiration;Pharyngeal residue - valleculae;Pharyngeal residue - pyriform;Pharyngeal residue - posterior pharnyx;Penetration/Aspiration during swallow Pharyngeal Material enters airway, remains ABOVE vocal cords and not ejected out;Material enters airway, CONTACTS cords and not ejected out;Material enters airway, passes BELOW cords without attempt by patient to eject out (silent aspiration) Pharyngeal- Nectar Cup Reduced pharyngeal peristalsis;Reduced epiglottic inversion;Delayed swallow initiation-pyriform sinuses;Reduced anterior laryngeal mobility;Reduced airway/laryngeal closure;Reduced tongue base retraction;Pharyngeal residue - valleculae;Pharyngeal residue - pyriform;Pharyngeal residue - posterior pharnyx;Penetration/Aspiration during swallow;Penetration/Apiration after swallow Pharyngeal Material enters airway, CONTACTS cords and not ejected out;Material enters airway, passes BELOW cords without attempt by patient to eject out (silent aspiration) Pharyngeal- Nectar Straw Reduced pharyngeal peristalsis;Reduced epiglottic inversion;Delayed swallow initiation-pyriform sinuses;Reduced anterior laryngeal mobility;Reduced airway/laryngeal closure;Reduced tongue base retraction;Pharyngeal residue - valleculae;Pharyngeal residue - pyriform;Pharyngeal residue - posterior pharnyx;Penetration/Aspiration during swallow;Penetration/Apiration after swallow Pharyngeal Material enters airway, passes BELOW cords without attempt by patient to eject out (silent aspiration) Pharyngeal- Thin Cup Reduced pharyngeal  peristalsis;Reduced epiglottic inversion;Delayed swallow initiation-pyriform sinuses;Reduced anterior laryngeal mobility;Reduced airway/laryngeal closure;Reduced tongue base retraction;Pharyngeal residue - valleculae;Pharyngeal residue - pyriform;Pharyngeal residue - posterior pharnyx;Penetration/Aspiration during swallow;Penetration/Apiration after swallow;Penetration/Aspiration before  swallow Pharyngeal Material enters airway, passes BELOW cords without attempt by patient to eject out (silent aspiration);Material enters airway, CONTACTS cords and not ejected out;Material enters airway, remains ABOVE vocal cords and not ejected out Pharyngeal- Thin Straw Reduced pharyngeal peristalsis;Reduced epiglottic inversion;Delayed swallow initiation-pyriform sinuses;Reduced anterior laryngeal mobility;Reduced airway/laryngeal closure;Reduced tongue base retraction;Pharyngeal residue - valleculae;Pharyngeal residue - pyriform;Pharyngeal residue - posterior pharnyx;Penetration/Aspiration during swallow;Penetration/Apiration after swallow Pharyngeal Material enters airway, passes BELOW cords without attempt by patient to eject out (silent aspiration);Material enters airway, CONTACTS cords and not ejected out Pharyngeal- Puree Reduced pharyngeal peristalsis;Reduced epiglottic inversion;Delayed swallow initiation-pyriform sinuses;Reduced anterior laryngeal mobility;Reduced airway/laryngeal closure;Reduced tongue base retraction;Pharyngeal residue - valleculae;Pharyngeal residue - pyriform;Pharyngeal residue - posterior pharnyx;Penetration/Aspiration during swallow Pharyngeal Material enters airway, CONTACTS cords and then ejected out Pharyngeal- Mechanical Soft Reduced pharyngeal peristalsis;Reduced epiglottic inversion;Delayed swallow initiation-pyriform sinuses;Reduced anterior laryngeal mobility;Reduced airway/laryngeal closure;Reduced tongue base retraction;Pharyngeal residue - valleculae;Pharyngeal residue -  pyriform;Pharyngeal residue - posterior pharnyx;Penetration/Aspiration during swallow;Penetration/Apiration after swallow Pharyngeal Material enters airway, remains ABOVE vocal cords and not ejected out    08/07/2022   1:12 PM Cervical Esophageal Phase  Cervical Esophageal Phase [facility] Dustin Blanchard, [place], [place] Office number [phone] Horton Marshall 08/07/2022, 3:46 PM                     DG Abd Portable 1V  Result Date: 08/07/2022 CLINICAL DATA:  NG tube placement. EXAM: PORTABLE ABDOMEN - 1 VIEW COMPARISON:  None Available. FINDINGS: Enteric tube is present as it courses through the stomach with tip over the right upper quadrant likely over the distal stomach or proximal duodenum. Visualized bowel gas pattern is nonobstructive. Contrast present within the large and small bowel. There is no free peritoneal air. Lung bases are clear. Mild cardiomegaly. Mild degenerative change of the spine. IMPRESSION: Enteric tube with tip over the right upper quadrant likely over the distal stomach or proximal duodenum. Electronically Signed   By: Marin Olp M.D.   On: 08/07/2022 15:17   MR BRAIN WO CONTRAST  Result Date: 08/06/2022 CLINICAL DATA:  Stroke follow-up EXAM: MRI HEAD WITHOUT CONTRAST TECHNIQUE: Multiplanar, multiecho pulse sequences of the brain and surrounding structures were obtained without intravenous contrast. COMPARISON:  None Available. FINDINGS: Brain: Multifocal acute/subacute ischemia in the left MCA territory, predominantly involving the left frontal operculum and posterior temporal lobe. There is a punctate focus of abnormal diffusion restriction within the paramedian right frontal lobe that is likely a susceptibility artifact related to falcine calcification. There is susceptibility effects in the left MCA territory from likely extravasated contrast. There is multifocal hyperintense T2-weighted signal within the white matter. Parenchymal volume and CSF  spaces are normal. Small volume hemorrhage at the left external capsule (series 6, image 19), which appears to be old. Vascular: Major flow voids are preserved. Skull and upper cervical spine: Normal calvarium and skull base. Visualized upper cervical spine and soft tissues are normal. Sinuses/Orbits:No paranasal sinus fluid levels or advanced mucosal thickening. No mastoid or middle ear effusion. Normal orbits. IMPRESSION: 1. Multifocal acute/subacute ischemia in the left MCA territory, predominantly involving the left frontal operculum and posterior temporal lobe. 2. Small volume hemorrhage at the left external capsule, favored to be old. Electronically Signed   By: Ulyses Jarred M.D.   On: 08/06/2022 18:54   ECHOCARDIOGRAM COMPLETE  Result Date: 08/06/2022    ECHOCARDIOGRAM REPORT   Patient Name:   Dustin Blanchard Date of Exam: 08/06/2022 Medical Rec #:  [id number]  Height:       72.0 in Accession #:    [id number]     Weight:       158.7 lb Date of Birth:  [birth year]       BSA:          1.931 m Patient Age:    39 years       BP:           136/90 mmHg Patient Gender: M              HR:           81 bpm. Exam Location:  Inpatient Procedure: 2D Echo, Cardiac Doppler and Color Doppler Indications:    Stroke  History:        Patient has no prior history of Echocardiogram examinations.                 Acute ischemic left MCA stroke ([place]), Dementia.  Sonographer:    Alvino Chapel RCS Referring Phys: [id number] [place]  1. Coarse trabeculation of the apex without obvious thrombus. Left ventricular ejection fraction, by estimation, is 35 to 40%. Left ventricular ejection fraction by 2D MOD biplane is 37.1 %. The left ventricle has moderately decreased function. The left  ventricle demonstrates global hypokinesis. There is mild left ventricular hypertrophy. Left ventricular diastolic parameters are consistent with Grade III diastolic dysfunction (restrictive). Elevated left ventricular end-diastolic  pressure.  2. Right ventricular systolic function is low normal. The right ventricular size is normal. There is mildly elevated pulmonary artery systolic pressure. The estimated right ventricular systolic pressure is 18.5 mmHg.  3. Left atrial size was severely dilated.  4. Moderate pericardial effusion. The pericardial effusion is circumferential. There is no evidence of cardiac tamponade.  5. The mitral valve is abnormal. Moderate mitral valve regurgitation.  6. The aortic valve is tricuspid. Aortic valve regurgitation is trivial.  7. The inferior vena cava is normal in size with greater than 50% respiratory variability, suggesting right atrial pressure of 3 mmHg. Comparison(s): No prior Echocardiogram. FINDINGS  Left Ventricle: Coarse trabeculation of the apex without obvious thrombus. Left ventricular ejection fraction, by estimation, is 35 to 40%. Left ventricular ejection fraction by 2D MOD biplane is 37.1 %. The left ventricle has moderately decreased function. The left ventricle demonstrates global hypokinesis. The left ventricular internal cavity size was normal in size. There is mild left ventricular hypertrophy. Left ventricular diastolic parameters are consistent with Grade III diastolic dysfunction (restrictive). Elevated left ventricular end-diastolic pressure. Right Ventricle: The right ventricular size is normal. No increase in right ventricular wall thickness. Right ventricular systolic function is low normal. There is mildly elevated pulmonary artery systolic pressure. The tricuspid regurgitant velocity is 3.21 m/s, and with an assumed right atrial pressure of 3 mmHg, the estimated right ventricular systolic pressure is 63.1 mmHg. Left Atrium: Left atrial size was severely dilated. Right Atrium: Right atrial size was normal in size. Pericardium: A moderately sized pericardial effusion is present. The pericardial effusion is circumferential. There is diastolic collapse of the right atrial wall and  diastolic collapse of the right ventricular free wall. There is no evidence of cardiac tamponade. Mitral Valve: The mitral valve is abnormal. There is mild thickening of the anterior and posterior mitral valve leaflet(s). Moderate mitral valve regurgitation. Tricuspid Valve: The tricuspid valve is grossly normal. Tricuspid valve regurgitation is mild. Aortic Valve: The aortic valve is tricuspid. Aortic valve regurgitation is trivial. Pulmonic Valve: The pulmonic valve was grossly  normal. Pulmonic valve regurgitation is trivial. Aorta: The aortic root and ascending aorta are structurally normal, with no evidence of dilitation. Venous: The inferior vena cava is normal in size with greater than 50% respiratory variability, suggesting right atrial pressure of 3 mmHg. IAS/Shunts: No atrial level shunt detected by color flow Doppler.  LEFT VENTRICLE PLAX 2D                        Biplane EF (MOD) LVIDd:         5.40 cm         LV Biplane EF:   Left LVIDs:         3.90 cm                          ventricular LV PW:         1.30 cm                          ejection LV IVS:        1.10 cm                          fraction by LVOT diam:     2.00 cm                          2D MOD LV SV:         47                               biplane is LV SV Index:   24                               37.1 %. LVOT Area:     3.14 cm                                Diastology                                LV e' medial:    5.98 cm/s LV Volumes (MOD)               LV E/e' medial:  15.6 LV vol d, MOD    125.5 ml      LV e' lateral:   8.49 cm/s A2C:                           LV E/e' lateral: 11.0 LV vol d, MOD    140.8 ml A4C: LV vol s, MOD    76.4 ml A2C: LV vol s, MOD    88.4 ml A4C: LV SV MOD A2C:   49.1 ml LV SV MOD A4C:   140.8 ml LV SV MOD BP:    50.3 ml RIGHT VENTRICLE RV S prime:     10.30 cm/s TAPSE (M-mode): 1.6 cm LEFT ATRIUM              Index        RIGHT ATRIUM           Index LA diam:  4.30 cm  2.23 cm/m   RA Area:     19.80  cm LA Vol (A2C):   94.0 ml  48.67 ml/m  RA Volume:   51.10 ml  26.46 ml/m LA Vol (A4C):   102.0 ml 52.82 ml/m LA Biplane Vol: 107.0 ml 55.40 ml/m  AORTIC VALVE LVOT Vmax:   75.60 cm/s LVOT Vmean:  48.600 cm/s LVOT VTI:    0.150 m  AORTA Ao Root diam: 4.10 cm MITRAL VALVE               TRICUSPID VALVE MV Area (PHT): 5.66 cm    TR Peak grad:   41.2 mmHg MV Decel Time: 134 msec    TR Vmax:        321.00 cm/s MV E velocity: 93.30 cm/s MV A velocity: 30.20 cm/s  SHUNTS MV E/A ratio:  3.09        Systemic VTI:  0.15 m                            Systemic Diam: 2.00 cm Lyman Bishop MD Electronically signed by Lyman Bishop MD Signature Date/Time: 08/06/2022/4:42:49 PM    Final    CT HEAD WO CONTRAST (5MM)  Result Date: 08/06/2022 CLINICAL DATA:  Stroke follow-up. Contrast extravasation during procedure. EXAM: CT HEAD WITHOUT CONTRAST TECHNIQUE: Contiguous axial images were obtained from the base of the skull through the vertex without intravenous contrast. RADIATION DOSE REDUCTION: This exam was performed according to the departmental dose-optimization program which includes automated exposure control, adjustment of the mA and/or kV according to patient size and/or use of iterative reconstruction technique. COMPARISON:  CT head dated 1 day prior and intra procedural images obtained earlier the same day FINDINGS: Brain: There is hyperdensity overlying the left cerebral convexity likely reflecting predominantly extravasated contrast from the recent procedure, though superimposed subarachnoid hemorrhage is difficult to entirely exclude. 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 There is no other evidence of evolving territorial infarct. 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 The ventricles are  stable in size. Additional patchy hypodensity in the supratentorial white matter likely reflecting sequela of background chronic white matter microangiopathy is unchanged There is no solid mass lesion. There is no mass effect or midline shift. Vascular: There is calcification of the bilateral cavernous ICAs. Skull: Normal. Negative for fracture or focal lesion. Sinuses/Orbits: The imaged paranasal sinuses are clear. Globes and orbits are unremarkable. Other: None. IMPRESSION: 1. Density overlying the left cerebral convexity is grossly similar to the intra procedural images obtained earlier the same day and is favored to reflect predominantly contrast extravasation; however, superimposed subarachnoid hemorrhage can not be entirely excluded. 2. Small focus of hypodensity in the left frontal lobe cortex suspicious for small evolving infarct. Electronically Signed   By: Valetta Mole M.D.   On: 08/06/2022 12:34   IR PERCUTANEOUS ART THROMBECTOMY/INFUSION INTRACRANIAL INC DIAG ANGIO  Result Date: 08/06/2022 INDICATION: 71 year old male with past medical history significant for hypertension, hyperlipidemia, sinus bradycardia and BPH; baseline modified Rankin scale 3. He presented to outside hospital with right-sided weakness, aphasia and gaze deviation; NIHSS 18. CT angiogram of the head and neck head equivocal findings in fall EXAM: ULTRASOUND-GUIDED VASCULAR ACCESS DIAGNOSTIC CEREBRAL ANGIOGRAM MECHANICAL THROMBECTOMY FLAT PANEL HEAD CT  COMPARISON:  None Available. MEDICATIONS: Ancef 2 g IV. The antibiotic was administered within 1 hour of the procedure ANESTHESIA/SEDATION: The procedure was performed under general anesthesia. CONTRAST:  100 mL of Omnipaque 300 milligram/mL FLUOROSCOPY: Radiation Exposure Index (as provided by the fluoroscopic device): 5,093 mGy Kerma COMPLICATIONS: SIR LEVEL B - Normal therapy, includes overnight admission for observation. TECHNIQUE: Informed written consent was obtained from the  patient's wife after a thorough discussion of the procedural risks, benefits and alternatives. All questions were addressed. Maximal Sterile Barrier Technique was utilized including caps, mask, sterile gowns, sterile gloves, sterile drape, hand hygiene and skin antiseptic. A timeout was performed prior to the initiation of the procedure. The right groin was prepped and draped in the usual sterile fashion. Using a micropuncture kit and the modified Seldinger technique, access was gained to the right common femoral artery and a 5 French sheath was placed. Real-time ultrasound guidance was utilized for vascular access including the acquisition of a permanent ultrasound image documenting patency of the accessed vessel. Under fluoroscopy, a 5 [place] Berenstein 2 catheter was navigated over a 0.035" Terumo Glidewire into the aortic arch. The catheter was placed into the left common carotid artery. Frontal roadmap was obtained and the catheter was then advanced over the wire into the left internal carotid artery. Frontal and lateral angiograms of the head were obtained. FINDINGS: 1. Mild atherosclerotic changes in the left common femoral artery without significant stenosis, adequate for vascular access. 2. 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 3. Hyperemia along the a patent left M3/MCA branch to the anterior parietal region with corresponding early venous opacification, suggesting ongoing ischemia. PROCEDURE: The Berenstein 2 catheter was removed over an exchange length Rosen wire and under biplane roadmap guidance. The 5 French sheath was then removed over the wire. An 8 French 25 cm femoral sheath was advanced over the wire. Then, a zoom 88 guide catheter was advanced over the wire into the upper cervical segment of the left ICA. Frontal and lateral angiograms of the head were obtained. Using biplane roadmap guidance, a zoom 55 aspiration catheter was navigated over a phenom 21 microcatheter and a  Aristotle 14 microguidewire into the cavernous segment of the left ICA. The microcatheter was then navigated over the wire into a left M3/MCA middle division branch. Attempt to advance the microcatheter over the wire proved unsuccessful due to significant resistance. The microcatheter was retracted frontal and lateral angiogram of the head were obtained. Small contrast extravasation was noted. The left M3/MCA branch appear recanalized with a persistent nonocclusive filling defect. Flat panel CT of the head was obtained and post processed in a separate workstation with concurrent attending physician supervision. Selected images were sent to PACS. Small contrast extravasation seen along the left sylvian fissure. Follow-up left internal carotid artery angiogram showed no evidence of further contrast extravasation. Delay left internal carotid artery angiogram showed stable recanalization with no evidence of further contrast extravasation. Repeat flat panel CT of the head was obtained and post processed in a separate workstation with concurrent attending physician supervision. Selected images were sent to PACS. Similar appearance of contrast extravasation in the left sylvian fissure. Repeat delay left internal carotid artery angiogram showed stable recanalization with no evidence of further contrast extravasation. Right common femoral artery angiogram was obtained in right anterior oblique view. The puncture is at the level of the common femoral artery. The artery has normal caliber, adequate for closure device. The sheath was exchanged over the wire for a  Perclose prostyle which was utilized for access closure. Immediate hemostasis was achieved. IMPRESSION: 1. 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 2. Capillary hyperemia along patent anterior left parietal M3 branch with early venous drainage suggestive of ongoing ischemia. 3. Access to occluded branch with microwire resulted in recanalization  (TICI 2C) with small contrast extravasation which resolved spontaneously. Delay flat panel CT and cerebral angiogram showed no further contrast extravasation. PLAN: Repeat head CT within 3 hours to evaluate for contrast extravasation stability. Electronically Signed   By: Pedro Earls M.D.   On: 08/06/2022 11:30   IR US Guide Vasc Access Right  Result Date: 08/06/2022 INDICATION: 71 year old male with past medical history significant for hypertension, hyperlipidemia, sinus bradycardia and BPH; baseline modified Rankin scale 3. He presented to outside hospital with right-sided weakness, aphasia and gaze deviation; NIHSS 18. CT angiogram of the head and neck head equivocal findings in fall EXAM: ULTRASOUND-GUIDED VASCULAR ACCESS DIAGNOSTIC CEREBRAL ANGIOGRAM MECHANICAL THROMBECTOMY FLAT PANEL HEAD CT COMPARISON:  None Available. MEDICATIONS: Ancef 2 g IV. The antibiotic was administered within 1 hour of the procedure ANESTHESIA/SEDATION: The procedure was performed under general anesthesia. CONTRAST:  100 mL of Omnipaque 300 milligram/mL FLUOROSCOPY: Radiation Exposure Index (as provided by the fluoroscopic device): 6,333 mGy Kerma COMPLICATIONS: SIR LEVEL B - Normal therapy, includes overnight admission for observation. TECHNIQUE: Informed written consent was obtained from the patient's wife after a thorough discussion of the procedural risks, benefits and alternatives. All questions were addressed. Maximal Sterile Barrier Technique was utilized including caps, mask, sterile gowns, sterile gloves, sterile drape, hand hygiene and skin antiseptic. A timeout was performed prior to the initiation of the procedure. The right groin was prepped and draped in the usual sterile fashion. Using a micropuncture kit and the modified Seldinger technique, access was gained to the right common femoral artery and a 5 French sheath was placed. Real-time ultrasound guidance was utilized for vascular access  including the acquisition of a permanent ultrasound image documenting patency of the accessed vessel. Under fluoroscopy, a 5 [place] Berenstein 2 catheter was navigated over a 0.035" Terumo Glidewire into the aortic arch. The catheter was placed into the left common carotid artery. Frontal roadmap was obtained and the catheter was then advanced over the wire into the left internal carotid artery. Frontal and lateral angiograms of the head were obtained. FINDINGS: 1. Mild atherosclerotic changes in the left common femoral artery without significant stenosis, adequate for vascular access. 2. 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 3. Hyperemia along the a patent left M3/MCA branch to the anterior parietal region with corresponding early venous opacification, suggesting ongoing ischemia. PROCEDURE: The Berenstein 2 catheter was removed over an exchange length Rosen wire and under biplane roadmap guidance. The 5 French sheath was then removed over the wire. An 8 French 25 cm femoral sheath was advanced over the wire. Then, a zoom 88 guide catheter was advanced over the wire into the upper cervical segment of the left ICA. Frontal and lateral angiograms of the head were obtained. Using biplane roadmap guidance, a zoom 55 aspiration catheter was navigated over a phenom 21 microcatheter and a Aristotle 14 microguidewire into the cavernous segment of the left ICA. The microcatheter was then navigated over the wire into a left M3/MCA middle division branch. Attempt to advance the microcatheter over the wire proved unsuccessful due to significant resistance. The microcatheter was retracted frontal and lateral angiogram of the head were obtained. Small contrast extravasation was  noted. The left M3/MCA branch appear recanalized with a persistent nonocclusive filling defect. Flat panel CT of the head was obtained and post processed in a separate workstation with concurrent attending physician supervision. Selected  images were sent to PACS. Small contrast extravasation seen along the left sylvian fissure. Follow-up left internal carotid artery angiogram showed no evidence of further contrast extravasation. Delay left internal carotid artery angiogram showed stable recanalization with no evidence of further contrast extravasation. Repeat flat panel CT of the head was obtained and post processed in a separate workstation with concurrent attending physician supervision. Selected images were sent to PACS. Similar appearance of contrast extravasation in the left sylvian fissure. Repeat delay left internal carotid artery angiogram showed stable recanalization with no evidence of further contrast extravasation. Right common femoral artery angiogram was obtained in right anterior oblique view. The puncture is at the level of the common femoral artery. The artery has normal caliber, adequate for closure device. The sheath was exchanged over the wire for a Perclose prostyle which was utilized for access closure. Immediate hemostasis was achieved. IMPRESSION: 1. 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 2. Capillary hyperemia along patent anterior left parietal M3 branch with early venous drainage suggestive of ongoing ischemia. 3. Access to occluded branch with microwire resulted in recanalization (TICI 2C) with small contrast extravasation which resolved spontaneously. Delay flat panel CT and cerebral angiogram showed no further contrast extravasation. PLAN: Repeat head CT within 3 hours to evaluate for contrast extravasation stability. Electronically Signed   By: Pedro Earls M.D.   On: 08/06/2022 11:30   IR CT Head Ltd  Result Date: 08/06/2022 INDICATION: 71 year old male with past medical history significant for hypertension, hyperlipidemia, sinus bradycardia and BPH; baseline modified Rankin scale 3. He presented to outside hospital with right-sided weakness, aphasia and gaze deviation;  NIHSS 18. CT angiogram of the head and neck head equivocal findings in fall EXAM: ULTRASOUND-GUIDED VASCULAR ACCESS DIAGNOSTIC CEREBRAL ANGIOGRAM MECHANICAL THROMBECTOMY FLAT PANEL HEAD CT COMPARISON:  None Available. MEDICATIONS: Ancef 2 g IV. The antibiotic was administered within 1 hour of the procedure ANESTHESIA/SEDATION: The procedure was performed under general anesthesia. CONTRAST:  100 mL of Omnipaque 300 milligram/mL FLUOROSCOPY: Radiation Exposure Index (as provided by the fluoroscopic device): 1,962 mGy Kerma COMPLICATIONS: SIR LEVEL B - Normal therapy, includes overnight admission for observation. TECHNIQUE: Informed written consent was obtained from the patient's wife after a thorough discussion of the procedural risks, benefits and alternatives. All questions were addressed. Maximal Sterile Barrier Technique was utilized including caps, mask, sterile gowns, sterile gloves, sterile drape, hand hygiene and skin antiseptic. A timeout was performed prior to the initiation of the procedure. The right groin was prepped and draped in the usual sterile fashion. Using a micropuncture kit and the modified Seldinger technique, access was gained to the right common femoral artery and a 5 French sheath was placed. Real-time ultrasound guidance was utilized for vascular access including the acquisition of a permanent ultrasound image documenting patency of the accessed vessel. Under fluoroscopy, a 5 [place] Berenstein 2 catheter was navigated over a 0.035" Terumo Glidewire into the aortic arch. The catheter was placed into the left common carotid artery. Frontal roadmap was obtained and the catheter was then advanced over the wire into the left internal carotid artery. Frontal and lateral angiograms of the head were obtained. FINDINGS: 1. Mild atherosclerotic changes in the left common femoral artery without significant stenosis, adequate for vascular access. 2. A left M3/MCA occlusion to  the frontal parietal  region. 3. Hyperemia along the a patent left M3/MCA branch to the anterior parietal region with corresponding early venous opacification, suggesting ongoing ischemia. PROCEDURE: The Berenstein 2 catheter was removed over an exchange length Rosen wire and under biplane roadmap guidance. The 5 French sheath was then removed over the wire. An 8 French 25 cm femoral sheath was advanced over the wire. Then, a zoom 88 guide catheter was advanced over the wire into the upper cervical segment of the left ICA. Frontal and lateral angiograms of the head were obtained. Using biplane roadmap guidance, a zoom 55 aspiration catheter was navigated over a phenom 21 microcatheter and a Aristotle 14 microguidewire into the cavernous segment of the left ICA. The microcatheter was then navigated over the wire into a left M3/MCA middle division branch. Attempt to advance the microcatheter over the wire proved unsuccessful due to significant resistance. The microcatheter was retracted frontal and lateral angiogram of the head were obtained. Small contrast extravasation was noted. The left M3/MCA branch appear recanalized with a persistent nonocclusive filling defect. Flat panel CT of the head was obtained and post processed in a separate workstation with concurrent attending physician supervision. Selected images were sent to PACS. Small contrast extravasation seen along the left sylvian fissure. Follow-up left internal carotid artery angiogram showed no evidence of further contrast extravasation. Delay left internal carotid artery angiogram showed stable recanalization with no evidence of further contrast extravasation. Repeat flat panel CT of the head was obtained and post processed in a separate workstation with concurrent attending physician supervision. Selected images were sent to PACS. Similar appearance of contrast extravasation in the left sylvian fissure. Repeat delay left internal carotid artery angiogram showed stable  recanalization with no evidence of further contrast extravasation. Right common femoral artery angiogram was obtained in right anterior oblique view. The puncture is at the level of the common femoral artery. The artery has normal caliber, adequate for closure device. The sheath was exchanged over the wire for a Perclose prostyle which was utilized for access closure. Immediate hemostasis was achieved. IMPRESSION: 1. 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 2. Capillary hyperemia along patent anterior left parietal M3 branch with early venous drainage suggestive of ongoing ischemia. 3. Access to occluded branch with microwire resulted in recanalization (TICI 2C) with small contrast extravasation which resolved spontaneously. Delay flat panel CT and cerebral angiogram showed no further contrast extravasation. PLAN: Repeat head CT within 3 hours to evaluate for contrast extravasation stability. Electronically Signed   By: Pedro Earls M.D.   On: 08/06/2022 11:30   IR CT Head Ltd  Result Date: 08/06/2022 INDICATION: 71 year old male with past medical history significant for hypertension, hyperlipidemia, sinus bradycardia and BPH; baseline modified Rankin scale 3. He presented to outside hospital with right-sided weakness, aphasia and gaze deviation; NIHSS 18. CT angiogram of the head and neck head equivocal findings in fall EXAM: ULTRASOUND-GUIDED VASCULAR ACCESS DIAGNOSTIC CEREBRAL ANGIOGRAM MECHANICAL THROMBECTOMY FLAT PANEL HEAD CT COMPARISON:  None Available. MEDICATIONS: Ancef 2 g IV. The antibiotic was administered within 1 hour of the procedure ANESTHESIA/SEDATION: The procedure was performed under general anesthesia. CONTRAST:  100 mL of Omnipaque 300 milligram/mL FLUOROSCOPY: Radiation Exposure Index (as provided by the fluoroscopic device): 3,295 mGy Kerma COMPLICATIONS: SIR LEVEL B - Normal therapy, includes overnight admission for observation. TECHNIQUE: Informed  written consent was obtained from the patient's wife after a thorough discussion of the procedural risks, benefits and alternatives. All questions were addressed.  Maximal Sterile Barrier Technique was utilized including caps, mask, sterile gowns, sterile gloves, sterile drape, hand hygiene and skin antiseptic. A timeout was performed prior to the initiation of the procedure. The right groin was prepped and draped in the usual sterile fashion. Using a micropuncture kit and the modified Seldinger technique, access was gained to the right common femoral artery and a 5 French sheath was placed. Real-time ultrasound guidance was utilized for vascular access including the acquisition of a permanent ultrasound image documenting patency of the accessed vessel. Under fluoroscopy, a 5 [place] Berenstein 2 catheter was navigated over a 0.035" Terumo Glidewire into the aortic arch. The catheter was placed into the left common carotid artery. Frontal roadmap was obtained and the catheter was then advanced over the wire into the left internal carotid artery. Frontal and lateral angiograms of the head were obtained. FINDINGS: 1. Mild atherosclerotic changes in the left common femoral artery without significant stenosis, adequate for vascular access. 2. 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 3. Hyperemia along the a patent left M3/MCA branch to the anterior parietal region with corresponding early venous opacification, suggesting ongoing ischemia. PROCEDURE: The Berenstein 2 catheter was removed over an exchange length Rosen wire and under biplane roadmap guidance. The 5 French sheath was then removed over the wire. An 8 French 25 cm femoral sheath was advanced over the wire. Then, a zoom 88 guide catheter was advanced over the wire into the upper cervical segment of the left ICA. Frontal and lateral angiograms of the head were obtained. Using biplane roadmap guidance, a zoom 55 aspiration catheter was navigated  over a phenom 21 microcatheter and a Aristotle 14 microguidewire into the cavernous segment of the left ICA. The microcatheter was then navigated over the wire into a left M3/MCA middle division branch. Attempt to advance the microcatheter over the wire proved unsuccessful due to significant resistance. The microcatheter was retracted frontal and lateral angiogram of the head were obtained. Small contrast extravasation was noted. The left M3/MCA branch appear recanalized with a persistent nonocclusive filling defect. Flat panel CT of the head was obtained and post processed in a separate workstation with concurrent attending physician supervision. Selected images were sent to PACS. Small contrast extravasation seen along the left sylvian fissure. Follow-up left internal carotid artery angiogram showed no evidence of further contrast extravasation. Delay left internal carotid artery angiogram showed stable recanalization with no evidence of further contrast extravasation. Repeat flat panel CT of the head was obtained and post processed in a separate workstation with concurrent attending physician supervision. Selected images were sent to PACS. Similar appearance of contrast extravasation in the left sylvian fissure. Repeat delay left internal carotid artery angiogram showed stable recanalization with no evidence of further contrast extravasation. Right common femoral artery angiogram was obtained in right anterior oblique view. The puncture is at the level of the common femoral artery. The artery has normal caliber, adequate for closure device. The sheath was exchanged over the wire for a Perclose prostyle which was utilized for access closure. Immediate hemostasis was achieved. IMPRESSION: 1. 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 2. Capillary hyperemia along patent anterior left parietal M3 branch with early venous drainage suggestive of ongoing ischemia. 3. Access to occluded branch with  microwire resulted in recanalization (TICI 2C) with small contrast extravasation which resolved spontaneously. Delay flat panel CT and cerebral angiogram showed no further contrast extravasation. PLAN: Repeat head CT within 3 hours to evaluate for contrast extravasation stability.  Electronically Signed   By: Pedro Earls M.D.   On: 08/06/2022 11:30   CT ANGIO HEAD NECK W WO CM W PERF (CODE STROKE)  Result Date: 08/06/2022 CLINICAL DATA:  71 year old male code stroke presentation, left MCA symptomatology. EXAM: CT ANGIOGRAPHY HEAD AND NECK CT PERFUSION BRAIN TECHNIQUE: Multidetector CT imaging of the head and neck was performed using the standard protocol during bolus administration of intravenous contrast. Multiplanar CT image reconstructions and MIPs were obtained to evaluate the vascular anatomy. Carotid stenosis measurements (when applicable) are obtained utilizing NASCET criteria, using the distal internal carotid diameter as the denominator. Multiphase CT imaging of the brain was performed following IV bolus contrast injection. Subsequent parametric perfusion maps were calculated using RAPID software. RADIATION DOSE REDUCTION: This exam was performed according to the departmental dose-optimization program which includes automated exposure control, adjustment of the mA and/or kV according to patient size and/or use of iterative reconstruction technique. CONTRAST:  146m OMNIPAQUE IOHEXOL 350 MG/ML SOLN COMPARISON:  Plain head CT 0449 hours today. CT cervical spine reported separately. FINDINGS: CT Brain Perfusion Findings: ASPECTS: Ten CBF (<30%) Volume: 069m CBV parameter changes of 13-16 mL at the left operculum. The Perfusion (Tmax>6.0s) volume: 12355m Hypoperfusion index 0.2. Mismatch Volume: 123m95mfarction Location:Left MCA territory CTA NECK Skeleton: Cervical spine CT detailed separately. No acute osseous abnormality identified. Upper chest: Mild dependent atelectasis. Mild  retained secretions in the upper thoracic esophagus. Other neck: Left thyroid nodules smaller than 1.5 cm Not clinically significant; no follow-up imaging recommended (ref: J Am Coll Radiol. 2015 Feb;12(2): 143-50).Otherwise negative. Aortic arch: Calcified aortic atherosclerosis. 3 vessel arch configuration. Right carotid system: Minimal plaque. Mild motion artifact distal to the right ICA bulb. No stenosis. Left carotid system: Mild tortuosity and minimal plaque. No stenosis. Vertebral arteries: Minimal plaque except at the left vertebral artery origin and V1 segment (series 7, image 290) where there is mild stenosis. No other stenosis along the course of both vertebral arteries to the skull base. CTA HEAD Posterior circulation: Moderate left vertebral artery soft plaque and stenosis series 7, image 155. This is proximal to the left PICA origin which remains patent. Patent vertebrobasilar junction. No distal right vertebral stenosis. Patent basilar artery without stenosis. Patent SCA and PCA origins. Posterior communicating arteries are diminutive or absent. Bilateral PCA branches appear fairly symmetric and within normal limits. No proximal left PCA occlusion. Anterior circulation: Bilateral ICA siphon atherosclerosis, with up to moderate plaque. Mild to moderate supraclinoid left ICA stenosis series 9, image 101. Contralateral right ICA siphon stenosis. Patent carotid termini. Patent MCA and ACA origins. Anterior communicating artery and bilateral ACA branches are within normal limits. Median artery of the corpus callosum (normal variant). Right MCA M1 segment and trifurcation are patent without stenosis. Right MCA branches are within normal limits. Left MCA M1 segment and left MCA trifurcation are patent. No left MCA M2 branch occlusion is identified. However, there is evidence of middle division M3 branch occlusion on series 12, image 26, corresponding to the level of the operculum. Venous sinuses: Grossly  patent. Anatomic variants: Median artery of the corpus callosum. Review of the MIP images confirms the above findings Preliminary report of the above discussed by telephone with Dr. CHRIJoseph Berkshire8/17/2023 at 05:22 . IMPRESSION: 1. CT Perfusion is positive for left MCA territory oligemia, proximally 120 mL. No infarct core detected by strict criteria, but abnormal CBV at the left operculum. 2. No large vessel occlusion identified on CTA, but left M3  operculum branch occlusion(s) suspected. Still, given the CTP findings and presentation recommend discussion with Neurology for consideration of NIR intervention. 3. The above was discussed by telephone with Dr. Joseph Berkshire in the ED on 08/06/2022 at 0522 hours, and also Dr. Curly [place] at [phone] hours. 4. 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 Bilateral ICA siphon plaque with up to moderate Left supraclinoid stenosis. Moderate Left Vertebral V4 stenosis. No proximal Left PCA branch occlusion (small age indeterminate left occipital pole infarct on plain CT). Electronically Signed   By: Genevie Ann M.D.   On: 08/06/2022 05:34   CT Cervical Spine Wo Contrast  Result Date: 08/06/2022 CLINICAL DATA:  71 year old male code stroke patient. Right side weakness. EXAM: CT CERVICAL SPINE WITHOUT CONTRAST TECHNIQUE: Multidetector CT imaging of the cervical spine was performed without intravenous contrast. Multiplanar CT image reconstructions were also generated. RADIATION DOSE REDUCTION: This exam was performed according to the departmental dose-optimization program which includes automated exposure control, adjustment of the mA and/or kV according to patient size and/or use of iterative reconstruction technique. COMPARISON:  Plain head CT the same time reported separately. FINDINGS: Alignment: Straightening of cervical lordosis. Cervicothoracic junction alignment is within normal limits. Bilateral posterior element alignment is within normal  limits. Skull base and vertebrae: Visualized skull base is intact. No atlanto-occipital dissociation. C1 and C2 appear intact and aligned. No acute osseous abnormality identified. Soft tissues and spinal canal: No prevertebral fluid or swelling. No visible canal hematoma. Elongated stylohyoid ligament calcification greater on the left. Disc levels: Widespread advanced cervical spine degeneration. Multilevel mild and up to moderate cervical spinal stenosis (series 9, image 31). Upper chest: Mild apical lung scarring. Chronic appearing mild T1 superior endplate deformity, Schmorl's node. Other: 14 mm left thyroid nodule, Not clinically significant; no follow-up imaging recommended (ref: J Am Coll Radiol. 2015 Feb;12(2): 143-50). IMPRESSION: 1. No acute traumatic injury identified in the cervical spine. 2. 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 Electronically Signed   By: Genevie Ann M.D.   On: 08/06/2022 05:19   CT HEAD CODE STROKE WO CONTRAST  Result Date: 08/06/2022 CLINICAL DATA:  Code stroke. 71 year old male. Fall at 0300 hours. Right side weakness and abnormal speech. EXAM: CT HEAD WITHOUT CONTRAST TECHNIQUE: Contiguous axial images were obtained from the base of the skull through the vertex without intravenous contrast. RADIATION DOSE REDUCTION: This exam was performed according to the departmental dose-optimization program which includes automated exposure control, adjustment of the mA and/or kV according to patient size and/or use of iterative reconstruction technique. COMPARISON:  None Available. FINDINGS: Brain: Cerebral volume is within normal limits for age. No midline shift, mass effect, or evidence of intracranial mass lesion. No ventriculomegaly. No acute intracranial hemorrhage identified. Patchy and confluent bilateral cerebral white matter hypodensity, fairly symmetric. No cortically based acute infarct identified in the left MCA  territory. But there is confluent hypodensity in the left occipital pole in a small area on series 5, image 16 (about 15-20 mm). Vascular: Calcified atherosclerosis at the skull base. No suspicious intracranial vascular hyperdensity. Skull: No acute osseous abnormality identified. Sinuses/Orbits: Visualized paranasal sinuses and mastoids are clear. Other: Leftward gaze. Scalp soft tissues appear within normal limits. ASPECTS [facility] Stroke Program Early CT Score) Total score (0-10 with 10 being normal): 10 (cytotoxic edema only in the left PCA territory). IMPRESSION: 1. Leftward gaze deviation, raising the possibility of cortical ischemia in this setting. No Left MCA territory cortical infarct changes are identified. But there  is a small age indeterminate Left PCA infarct at the left occipital pole. 2. No acute intracranial hemorrhage identified. ASPECTS 10. 3. Advanced bilateral white matter changes most commonly due to small vessel disease. Study discussed by telephone with Dr. Joseph Berkshire on 08/06/2022 at 05:11 . Electronically Signed   By: Genevie Ann M.D.   On: 08/06/2022 05:11    Labs:  Basic Metabolic Panel: Recent Labs  Lab 08/22/22 0934  NA 144  K 3.6  CL 109  CO2 29  GLUCOSE 113*  BUN 9  CREATININE 0.94  CALCIUM 9.2    CBC: No results for input(s): "WBC", "NEUTROABS", "HGB", "HCT", "MCV", "PLT" in the last 168 hours.   CBG: No results for input(s): "GLUCAP" in the last 168 hours.  Brief HPI:   Dustin Blanchard is a 71 y.o. right-handed male with history of hypertension hyperlipidemia sinus bradycardia/chronic CHF with mild regurgitation BPH history of tobacco use vascular dementia diagnosed 2022.  Per chart review lives with spouse.  Independent prior to admission.  Presented 08/06/2022 with acute onset of right-sided weakness and aphasia.  Cranial CT scan showed small age-indeterminate left PCA infarct of the left occipital lobe.  CT cervical spine negative.  CT angiogram head and  neck positive for left MCA territory oligemia.  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  Patient did receive TNK as well as endovascular revascularization of occlusion of left M3 MCA middle division branch 08/06/2022 per interventional radiology.  Hospital course cardiology service consulted for new A-fib RVR/CHF.  Echocardiogram showed coarse trabeculation of the apex without obvious thrombus.  Left ventricular ejection fraction 35 to 40%.  Initially placed on Cardizem drip with close monitoring of bradycardia switch to amiodarone.  Plan outpatient ischemic work-up after recovery from CVA.  Eliquis initiated for CVA prophylaxis as well as atrial fibrillation.  Currently n.p.o. with alternative means of nutritional support.  Bouts of sundowning with noted history of vascular dementia maintained on Seroquel as well as Depakote.  Therapy evaluations completed due to patient decreased functional mobility was admitted for a comprehensive rehab program.   Hospital Course: Lawsen Arnott was admitted to rehab 08/12/2022 for inpatient therapies to consist of PT, ST and OT at least three hours five days a week. Past admission physiatrist, therapy team and rehab RN have worked together to provide customized collaborative inpatient rehab.  Pertaining to patient's left MCA patchy infarct and punctate right ACA infarct due to left M3 occlusion status post TNK and revascularization.  Patient remained on Eliquis therapy.  Noted history of vascular dementia recently diagnosed maintained on valproic acid melatonin as well as Seroquel.  He was attending therapies.  New onset atrial fibrillation RVR follow-up cardiology services amiodarone as advised with Aldactone.  Plan outpatient ischemic work-up after recovery of CVA.  N.p.o. initially alternative means of nutritional support follow-up modified barium swallow currently on dysphagia #1 nectar liquid diet.  Lipitor for  hyperlipidemia.  Hospital course he did complete a course of antibiotic therapy for right lower lobe pneumonia.  Chronic BPH Flomax resumed he was treated for UTI during hospital course.  Decreased nutritional storage with Remeron added which would also help aid in patient's mental stability.   Blood pressures were monitored on TID basis and soft and monitored     Rehab course: During patient's stay in rehab weekly team conferences were held to monitor patient's progress, set goals and discuss barriers to discharge. At admission, patient required moderate assist 125  feet rolling walker minimal assist sit to stand  Physical exam.  Blood pressure 110/70 pulse 70 temperature 98 respirations 16 oxygen saturations 92% room air Constitutional.  No acute distress HEENT Head.  Normocephalic and atraumatic Eyes.  Pupils round and reactive to light no discharge without nystagmus Neck.  Supple nontender no JVD without thyromegaly Cardiac regular rate and rhythm without any extra sounds or murmur heard Abdomen.  Soft nontender positive bowel sounds without rebound Respiratory effort normal no respiratory distress without wheeze Skin.  Intact Neurologic.  Alert right facial droop globally aphasic.  He would follow some simple motor commands with prompting.  He/She  has had improvement in activity tolerance, balance, postural control as well as ability to compensate for deficits. He/She has had improvement in functional use RUE/LUE  and RLE/LLE as well as improvement in awareness.  Completing supine to sit edge of bed supervision.  Able to scoot forward to edge of bed with supervision.  Ambulates 165 feet minimal assist without assistive device.  He did need cues for safety.  He is min assist for toileting contact-guard for managing bilateral lower extremity during tub bench transfers.  Completes bathing at sink with minimal assist for buttocks only to stand.  Minimal assist lower body dressing with the use  of figure 4 positioning.  Speech therapy continue to follow for aphasia/dysphagia..  Full family teaching completed discharge to home       Disposition.  Discharge to home:     Diet: Dysphagia #1 nectar thick liquids  Special Instructions: No driving smoking or alcohol  Medications at discharge 1.  Tylenol as needed 2.  Amiodarone 200 mg daily 3.  Eliquis 5 mg twice daily 4.  Lipitor 40 mg daily 5.  B complex with vitamin C 1 tab daily 6.  Vitamin D 800 units daily 7.  Magnesium gluconate 250 mg p.o. nightly 8.  Melatonin 3 mg nightly 9.  Remeron 7.5 mg nightly 10.  Multivitamin daily 11.  Protonix 40 mg daily 12.  Seroquel 25 mg p.o. daily with supper 13.  Aldactone 25 mg p.o. daily 14.  Flomax 0.4 mg daily after supper 15.  Valproic acid 250 mg nightly  30-35 minutes were spent completing discharge summary and discharge planning  Discharge Instructions     Ambulatory referral to Neurology   Complete by: As directed    An appointment is requested in approximately: 4 weeks left MCA infarction   Ambulatory referral to Physical Medicine Rehab   Complete by: As directed    Moderate complexity follow-up 1 to 2 weeks left MCA infarction        Follow-up Information     Raulkar, Clide Deutscher, MD Follow up.   Specialty: Physical Medicine and Rehabilitation Why: Office to call for appointment Contact information: [address] [place] [address] [phone]         Donato Heinz, MD Follow up.   Specialties: Cardiology, Radiology Why: Call for appointment Contact information: [address] [place] [address] Norco, Katyucia, MD Follow up.   Specialties: Radiology, Interventional Radiology Why: Call for appointment Contact information: [address] [place] [address] [phone]                 Signed: Cathlyn Parsons 08/25/2022, 5:12 AM

## 2022-08-19 NOTE — Patient Care Conference (Signed)
Inpatient RehabilitationTeam Conference and Plan of Care Update Date: 08/19/2022   Time: 11:20 AM    Patient Name: Dustin Blanchard      Medical Record Number: [id number]  Date of Birth: December 19, 1951 Sex: Male         Room/Bed: 4W09C/4W09C-01 Payor Info: Payor: [employer] MEDICARE / Plan: [employer] MEDICARE HMO/PPO / Product Type: *No Product type* /    Admit Date/Time:  08/12/2022  4:46 PM  Primary Diagnosis:  Left middle cerebral artery stroke [facility])  Hospital Problems: Principal Problem:   Left middle cerebral artery stroke [facility]) Active Problems:   Acute ischemic left MCA stroke (HCC)   Pneumonia of right lower lobe due to infectious organism    Expected Discharge Date: Expected Discharge Date: 08/25/22  Team Members Present: Physician leading conference: Dr. Sula Soda Social Worker Present: Dossie Der, LCSW Nurse Present: Chana Bode, RN PT Present: Wynelle Link, PT OT Present: Roney Mans, OT SLP Present: Sarita Bottom, SLP PPS Coordinator present : Fae Pippin, SLP     Current Status/Progress Goal Weekly Team Focus  Bowel/Bladder   Incontinent of B/B.  Regain continence  Assist with toileting needs as needed   Swallow/Nutrition/ Hydration   MBSS completed on 08/19/2022 - recs for D1 + NTL, Max A  Max A  Tolerance of current diet, monitor intake   ADL's   As of 8/29 has made significant improvements from eval. ADLs are the following: min A bathing (sinkside), mod A UB dressing, Min A LB dressing, max A toileting. Pt is apraxic, with poor sequencing however benefited from hand over hand and visual cueing for increased command following and initiation  Min A but may need upgrade based on quick improvements  ADL retraining, family ed, functional transfers/balance, initiation/awareness, L NMR/visual scanning   Mobility   Much improved since evaluation. Performance fluctuates depending on alertness. Can be as good as supervision for bed mobility, minA for stand<>pivot  transfers, and ambulating >167ft with minA and no AD. R inattention, apraxic, delayed initiation, impulsive, confused  minA but may upgrade depending on LOS and progress  Pt education, family education, functional transfers, gait training with LRAD, NMR, dynamic standing balance   Communication   Pt inconsistently answers yes/no questions, which are not always accurate. Follows one-step commands with min-mod to mod A multimodal cues. Essentially non-verbal  Mod A for basic auditory comprehension; Max A for expression of wants/needs via multimodal communication and verbal expression of wants, needs, or ideas  Continue with yes/no questions, command following, and vebral responses   Safety/Cognition/ Behavioral Observations  Min A for attention  Mod A for attention - 10 minutes - will plan to upgrade goal  Sustained attention during language and functional tasks   Pain   No c/o pain  Pain <3/10  Assess Qshift and prn   Skin   Skin intact  Remain intact  Assess Qshift and prn     Discharge Planning:  Home with wife who is here daily and participates in his care here. 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 Depression meds adjusted for poor appetite. Continent of bowel and bladder. Function fluctuates with cooperation, stubbornness, and confusion vs vascular dementia.  Patient on target to meet rehab goals: yes, currently needs supervision for bed mobility and min assist for sit - stand. Able to ambulate up to 150' without an assistive device using HHA for  support.  Needs min assist for bathing and mod assist for dressing. Currently on D1 nectar diet with pharyngeal residual but able to clear . Needs max assist for feeding. Able to follow one step commands with multimodal cues. Goals for discharge set for min assist overall.   *See Care Plan and progress notes for long and  short-term goals.   Revisions to Treatment Plan:  N/a  Teaching Needs: Safety, dietary modifications, medications, transfers, toileting, etc  Current Barriers to Discharge: Decreased caregiver support and Home enviroment access/layout  Possible Resolutions to Barriers: Family education HH follow up services     Medical Summary Current Status: dysphagia, severe right sided inattention, agitation, aspiration pneumonia, atrial fibrillation  Barriers to Discharge: Medical stability;Behavior  Barriers to Discharge Comments: dysphagia, severe right sided inattention, agitation, aspiration pneumonia, atrial fibrillation Possible Resolutions to [employer] Focus: continue IV fluids, continue monitoring of weight, continue depakote and seroquel HS, continue IV Ancef, decreased amiodarone dose, continue Eliquis   Continued Need for Acute Rehabilitation Level of Care: The patient requires daily medical management by a physician with specialized training in physical medicine and rehabilitation for the following reasons: Direction of a multidisciplinary physical rehabilitation program to maximize functional independence : Yes Medical management of patient stability for increased activity during participation in an intensive rehabilitation regime.: Yes Analysis of laboratory values and/or radiology reports with any subsequent need for medication adjustment and/or medical intervention. : Yes   I attest that I was present, lead the team conference, and concur with the assessment and plan of the team.   Chana Bode B 08/19/2022, 2:45 PM

## 2022-08-19 NOTE — Progress Notes (Signed)
Occupational Therapy Session Note  Patient Details  Name: Dustin Blanchard MRN: [id number] Date of Birth: July 03, 1951  Today's Date: 08/19/2022 OT Individual Time: 1400-1445 OT Individual Time Calculation (min): 45 min    Short Term Goals: Week 1:  OT Short Term Goal 1 (Week 1): Pt will sit EOB with mod a and cues for simple ADL OT Short Term Goal 2 (Week 1): Pt will bathe UB with max a and mod cues OT Short Term Goal 3 (Week 1): Pt will complete simple grooming task with mod cues with 1/3 step initiation OT Short Term Goal 4 (Week 1): Pt will don pull over shirt wiht max A and mod cues using hemi techniques  Skilled Therapeutic Interventions/Progress Updates:    Patient agreeable to participate in OT session. Reports 0/10 pain level. Wife present during session.   Patient participated in skilled OT session focusing on ADL re-training, sit to stand transitions, RUE awareness and increased use during functional tacks, standing balance while participating in bathing and dressing at sink level. Therapist facilitated increased RUE use with verbal cues to use UE to wash his left arm, tactile cues to provide hand over hand assist to initiate. Pt required increased time and repetition of multimodel cueing before demonstrating carry over and completing activity. Verbal cues and tactile cues required during LB dressing with manual assist to flex right hip in order to elevate foot when threading into pants leg or incontinence brief. Min Assist provided for UB dressing, LB bathing. Mod A provided for LB dressing due to decreased right side awareness, joint stiffness, muscle weakness and decreased gross motor coordination. Overall, pt demonstrated improved bathing and dressing performance with use of multimodel cueing. Provided fine motor fidget lap pad for use in room to increase BUE coordination.    Therapy Documentation Precautions:  Precautions Precautions: Fall Precaution Comments:  B wrist restraints,  pelvic restraint Restrictions Weight Bearing Restrictions: No    Therapy/Group: Individual Therapy  Limmie Patricia, OTR/L,CBIS  Supplemental OT - [facility]  08/19/2022, 5:37 PM

## 2022-08-19 NOTE — Progress Notes (Addendum)
Speech Language Pathology Daily Session Note  Patient Details  Name: Dustin Blanchard MRN: [id number] Date of Birth: [birth year]  Today's Date: 08/19/2022 SLP Individual Time: 0750-0855 SLP Individual Time Calculation (min): 65 min  Short Term Goals: Week 1: SLP Short Term Goal 1 (Week 1): Pt will maintain alertness for 3 minute intervals given Max A multimodal cues. - Alert for the entire session without cues to remain awake.  SLP Short Term Goal 2 (Week 1): Following oral care, pt will participate in CSE and/or therapeutic PO trials of ice chips and tsp sips of water with minimal s/sx c/f laryngeal invasion in preparation for repeat instrumental swallow study. SLP Short Term Goal 2 - Progress (Week 1): Met  SLP Short Term Goal 3 (Week 1): Pt will follow contextual commands x 5 with Max A multimodal cues. - x 5+ with Min A verbal cues.  SLP Short Term Goal 4 (Week 1): Pt will answer biographical yes/no questions x 3 given Max A multimodal cues. - x 3 given Min A multimodal cues.  SLP Short Term Goal 5 (Week 1): Pt will participate in ongoing cognitive-linguistic assessment to further determine ST POC and aid in d/c planning. - Did not formally address.  Skilled Therapeutic Interventions: S: Pt seen this date for skilled ST intervention targeting deglutition and communication/languagee goals outlined above. Pt received awake/alert and attempting to get OOB; however, unable with posey lap belt donned. Pt remains essentially non-verbal, though will attempt to answer yes/no questions, demonstrate emotion, and facial expressions. Remains emotionally labile at times. Agreeable to ST intervention in hospital room via head nod. Transferred from bed to TIS with Min A.   O: Please see above for objective data re: pt's performance on targeted goals. SLP facilitated today's session by providing Max A adherence to aspiration precautions during AM meal. Pt continues to demonstrate intermittent throat clearing  during PO intake with pt's wife endorsing that pt did this frequently in the absence and presence of PO prior to admission. No hx of pulmonary compromise nor PNA, per wife's report. Pt exhibited cough x 2 as the meal was coming to a conclusion. Vocal quality noted to be clear and dry following throat clear and cough. SpO2 noted to be between 97-100% at the conclusion of today's meal. Answered yes/no questions more frequently via head gestures and verbalizations; however, accuracy was noted to be variable (~50%). Completed rote/automatic speech task of counting to 10 with Max to Total A multimodal cues. Followed one-step commands with overall Min A verbal cues. Pt remains essentially non-verbal with the exception of verbal attempts to say "hi," "uh huh" for yes, "no," and humming to different tunes. Pt was noted to intermittently attempt verbalizations, though unable to produce meaningful words nor phrases. Provided ongoing skilled education to pt's wife re: aspiration precautions, safe swallowing strategies (slow rate, use of dry spoon between bites to elicit secondary swallow), OOB for meals, oral care, and showed her how to thicken liquids to nectar-thick consistencies with gel packs. [name] returned demonstration of education and verbalized understanding of information.   A: Pt remains somewhat sitmulable for skilled ST intervention as evident by participation in rote speech task, which pt was unable to complete prior to today's session, improvement in responses to yes/no questions and command following given extended processing time and Min to Min-Mod A multimodal cues. Recommend continuation of current diet textures (Dysphagia 1 with nectar-thick liquids) with adherence to aspiration precautions to include slow feeding rate, providing dry spoon intermittently between  bites to encourage additional dry swallows in an effort to clear pharyngeal residuals visualized on MBSS completed on 08/18/2022, and upright  positioning. Will plan to continue with close monitoring to assess tolerance. Continue to provide multimodal communication opportunities to include yes/no questions, use of gestures, and closed-ended questions, and binary choice prompts to allow pt to communicate wants, needs, and ideas.  P: Pt left OOB in TIS with wife present. Call bell reviewed and within reach and all immediate needs met. Continue per current ST POC next session.  Pain Pain Assessment Pain Scale: Faces Faces Pain Scale: No hurt  Therapy/Group: Individual Therapy   A  08/19/2022, 9:55 AM

## 2022-08-19 NOTE — Progress Notes (Signed)
Physical Therapy Session Note  Patient Details  Name: Dustin Blanchard MRN: [id number] Date of Birth: 05-23-1951  Today's Date: 08/19/2022 PT Individual Time: 0919-1030 PT Individual Time Calculation (min): 71 min   Short Term Goals: Week 1:  PT Short Term Goal 1 (Week 1): Pt will complete bed mobility with maxA PT Short Term Goal 2 (Week 1): Pt will complete bed<>chair transfers with maxA and LRAD PT Short Term Goal 3 (Week 1): Pt will ambulate 24ft with maxA and LRAD PT Short Term Goal 4 (Week 1): Pt will initiate stair training  Skilled Therapeutic Interventions/Progress Updates:    Pt received sitting in TIS w/c with his wife, Rinaldo Cloud, present and pt nodding head yes in agreement to therapy session. Nurse present to disconnect IV for therapy session. Pt does not have to wear B mittens any longer due to cortrak no longer present. Pt remains primarily nonverbal with a few attempts to vocalize during session but unable to form words. Transported to/from gym in w/c for time management and energy conservation.  Sit<>stands to/from TIS w/c with CGA/light min assist for steadying/balance. Pt slightly impulsive requiring cuing not to get up prior to therapist ready. Also, when turning to sit down in w/c after gait training pt frequently not turning fully prior to initiating sitting requiring min assist/cuing to ensure his safety.  Gait training ~139ft around nurses station, no AD nor UE support, with light min assist for balance, pt continues to have slight forward flexed posture with narrow BOS and decreased B LE step lengths - pt continues to demonstrates R inattention with consistent L gaze preference. Therapist on pt's R side to engage pt's attention on that side throughout.  Participated in gait training and R attention task via walking to collect horseshoes from R side of the room with his R hand targeting NMR - requires max hand-over-hand cuing to understand the sequence of the task on 1st round  and then with min carryover into 2nd round of the same task - requires max/total cuing to visually scan to R to locate the horseshoes. Pt then ambulated over to horseshoe ring and tossed horseshoes to it with pt having difficulty coordinating a tossing movement with his  RUE benefiting from visual demonstration and intermittent hand-over-hand facilitation.   Transitioned to dynamic standing balance, dynamic gait, R attention, and R UE NMR task of shooting a basketball - therapist biased placing items on his R side to promote visual scanning in that direction - +2 assist present to toss pt the ball from R side to promote attention to that side with pt responding well to this - therapist providing hand-over-hand facilitation for pt to "shoot" basketball over-handed for NMR - pt intermittently retrieves the ball including dynamic stepping and gait to move towards it quickly requiring min assist for safety throughout. Pt able to retrieve ball from the ground with therapist providing cuing to use R UE for NMR and R hemibody attention.   Pt noted to be incontinent of bladder. Gait training ~118ft back to his room, no AD nor UE support, with light min assist as described above. Gait in/out bathroom with light min assist and requiring max cuing to attend and locate toilet on his R side - pt starts to sit prior to removing his LB clothing requiring total assist for this. Pt further continent of bladder and small BM. Pt with poor awareness of need to perform peri-care requiring dependent assist.  Transported back to therapy gym. Therapist provided pt with visual  field block on the L side of his glasses to see if this would promote R attention/improved midline orientation but difficult to determine within remaining time available for session.  Gait training ~162ft, no AD, wearing glasses with L visual field block and light min assist for balance - continues with above gait deviations - possibly improved midline visual  scanning but difficult to determine. At end of session, pt left seated in w/c with his wife present, needs in reach, and seat belt alarm on.    Therapy Documentation Precautions:  Precautions Precautions: Fall Precaution Comments: NG tube, B wrist restraints, pelvic restraint Restrictions Weight Bearing Restrictions: No   Pain: No expression of pain noted throughout session.    Therapy/Group: Individual Therapy  Ginny Forth , PT, DPT, NCS, CSRS 08/19/2022, 7:49 AM

## 2022-08-20 NOTE — Progress Notes (Signed)
Occupational Therapy Session Note  Patient Details  Name: Dustin Blanchard MRN: [id number] Date of Birth: 10/18/1951  Today's Date: 08/20/2022 OT Individual Time: 1115-1200 OT Individual Time Calculation (min): 45 min    Short Term Goals: Week 1:  OT Short Term Goal 1 (Week 1): Pt will sit EOB with mod a and cues for simple ADL OT Short Term Goal 2 (Week 1): Pt will bathe UB with max a and mod cues OT Short Term Goal 3 (Week 1): Pt will complete simple grooming task with mod cues with 1/3 step initiation OT Short Term Goal 4 (Week 1): Pt will don pull over shirt wiht max A and mod cues using hemi techniques  Skilled Therapeutic Interventions/Progress Updates:  Pt at nurse's station upon therapy arrival with nursing provided update on pt's cognition. Pt recently finished with PT session and during PT session demonstrated increased restless, confusion, and frustration due to difficulty communicating with rehab staff.   Pt was brought into personal room via w/c to decrease level of sensory stimulation and to help decrease pt's frustration level while focusing on communicating needs. Skilled OT session focused on functional communication via verbal language and visual aid utilizing communication board (borrowed from SLP office). Wife arrived shortly after start of session. Education provided during session on effects of aphasia on cognition and increased frustration with inability to effectively communicate wants/needs. Pt was labile during session with increased frustration while attempting to communicate effectively with therapist and Wife.   OT and wife were able to assist patient into bathroom to attempt toileting. Pt navigating within his room with HHA only. When provided wash cloth set-up and verbal and visual cues to initiate task; he washed his face. T-shirt was changed with Mod Assist provided to initiate and complete task.  Pt was left in room with Wife present. Pt calm although somber in  expression.     Therapy Documentation Precautions:  Precautions Precautions: Fall Precaution Comments: NG tube, B wrist restraints, pelvic restraint Restrictions Weight Bearing Restrictions: No    Therapy/Group: Individual Therapy  Limmie Patricia, OTR/L,CBIS  Supplemental OT - [facility]  08/20/2022, 9:08 PM

## 2022-08-20 NOTE — Progress Notes (Signed)
PROGRESS NOTE   Subjective/Complaints: Wife asks if he can receive handicap placard- messaged SW to obtain one Discussed with wife changing antidpressant to Remeron  ROS: Unable to obtain given mental status.   Objective:   No results found.  No results for input(s): "WBC", "HGB", "HCT", "PLT" in the last 72 hours.  No results for input(s): "NA", "K", "CL", "CO2", "GLUCOSE", "BUN", "CREATININE", "CALCIUM" in the last 72 hours.   Intake/Output Summary (Last 24 hours) at 08/20/2022 1057 Last data filed at 08/19/2022 1800 Gross per 24 hour  Intake 150 ml  Output --  Net 150 ml         Physical Exam: Vital Signs Blood pressure 113/70, pulse 62, temperature 97.6 F (36.4 C), resp. rate 16, height 6' (1.829 m), weight 64.1 kg, SpO2 100 %. Gen: no distress, normal appearing, BMI 19.29, somnolent HEENT: oral mucosa pink and moist, NCAT Cardio: Bradycardic  Chest: CTAB, normal effort, normal rate of breathing Abd: soft, non-distended, +BS Ext: no edema Psych: tearful regarding situation Skin: intact Neurological:     Comments: Patient is alert.  Right facial droop. Globally aphasic.  He would follow some simple motor commands with significant prompting.  Severe right sided inattention Mod A for lower body dressing   Assessment/Plan: 1. Functional deficits which require 3+ hours per day of interdisciplinary therapy in a comprehensive inpatient rehab setting. Physiatrist is providing close team supervision and 24 hour management of active medical problems listed below. Physiatrist and rehab team continue to assess barriers to discharge/monitor patient progress toward functional and medical goals  Care Tool:  Bathing    Body parts bathed by patient: Right arm, Left arm, Chest, Abdomen, Front perineal area, Right upper leg, Left upper leg, Right lower leg, Left lower leg, Face   Body parts bathed by helper: Buttocks      Bathing assist Assist Level: Minimal Assistance - Patient > 75%     Upper Body Dressing/Undressing Upper body dressing   What is the patient wearing?: Pull over shirt    Upper body assist Assist Level: Moderate Assistance - Patient 50 - 74%    Lower Body Dressing/Undressing Lower body dressing      What is the patient wearing?: Pants, Incontinence brief     Lower body assist Assist for lower body dressing: Minimal Assistance - Patient > 75%     Toileting Toileting    Toileting assist Assist for toileting: Minimal Assistance - Patient > 75%     Transfers Chair/bed transfer  Transfers assist  Chair/bed transfer activity did not occur: Safety/medical concerns  Chair/bed transfer assist level: Minimal Assistance - Patient > 75%     Locomotion Ambulation   Ambulation assist   Ambulation activity did not occur: Safety/medical concerns  Assist level: 2 helpers Assistive device: Hand held assist Max distance: 179ft   Walk 10 feet activity   Assist  Walk 10 feet activity did not occur: Safety/medical concerns  Assist level: 2 helpers Assistive device: Hand held assist   Walk 50 feet activity   Assist Walk 50 feet with 2 turns activity did not occur: Safety/medical concerns  Assist level: Minimal Assistance - Patient > 75%  Walk 150 feet activity   Assist Walk 150 feet activity did not occur: Safety/medical concerns  Assist level: Minimal Assistance - Patient > 75% Assistive device: Hand held assist    Walk 10 feet on uneven surface  activity   Assist Walk 10 feet on uneven surfaces activity did not occur: Safety/medical concerns         Wheelchair     Assist Is the patient using a wheelchair?: Yes Type of Wheelchair: Manual Wheelchair activity did not occur: Safety/medical concerns         Wheelchair 50 feet with 2 turns activity    Assist    Wheelchair 50 feet with 2 turns activity did not occur: Safety/medical  concerns       Wheelchair 150 feet activity     Assist  Wheelchair 150 feet activity did not occur: Safety/medical concerns       Blood pressure 113/70, pulse 62, temperature 97.6 F (36.4 C), resp. rate 16, height 6' (1.829 m), weight 64.1 kg, SpO2 100 %.  Medical Problem List and Plan: 1. 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  Atrial fibrillation with RVR: Eliquis initiated 08/10/2022. Continue amiodarone 200mg  daily             -antiplatelet therapy: N/A 3. Pain: n/a 4. 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 Add melatonin 3mg  HS             -antipsychotic agents: Seroquel 25 mg daily with supper and 12.5 mg 3 times daily as needed. 5. Neuropsych/cognition: This patient is not capable of making decisions on his own behalf. 6. Skin/Wound Care: Routine skin checks 7. Fluids/Electrolytes/Nutrition: Routine in and outs with follow-up chemistries 8.  New onset atrial fibrillation RVR/CHF.  Follow-up cardiology service.  Amiodarone as directed, Aldactone 12.5 mg daily. Add magnesium supplement 250mg  gluconate HS. Cardiac rate controlled.  Plan outpatient ischemic work-up after recovery from CVA 9.  Dysphagia.  NPO.  Alternative means of nutritional support.  Follow-up speech therapy. Discussed eval with SLP today before replacing NGT 10.  Hyperlipidemia.  Continue Lipitor 11. Suboptimal vitamin D level: start 800U daily 12. Hyperglycemia: likely secondary to tube feeds, hemoglobin A1c reviewed and is 4.6, continue to monitor. Does not need CBG checks.  13. Right lower lobe pneumonia: continue Ancef 14. Worsening  lethargy: CT head ordered and stable, improved, likely secondary to pneumonia 15. Bradycardia: will reach out to cardiology to see if Amiodarone can be decreased  -Spoke with cardiology 8/26, will decrease amiodarone to 200daily, appreciate assistance   8/27 Continues to have episodic bradycardia, Cardiology advises as long as HR increased with activity appropriately and no sx HR in 50s, no further action needed 16. UTI: on Ancef, f/u UC- 7000 CFU e cloacae resistant to cefazolin  -Foley draining clear yellow urine, only 7000CFU-likely more contaminant- continue to monitor  -8/27 leukocytosis resolved on CBC today 18. Chronic BPH: flomax restarted HS 19. Depression: replace Prozac with Remeron to help stimulate appetite, discussed with wife.  20. Decreased appetite: start Remeron 7.5mg  HS, discussed with wife.   LOS: 8 days A FACE TO FACE EVALUATION WAS PERFORMED  Drema Pry Harlee Eckroth 08/20/2022, 10:57 AM

## 2022-08-20 NOTE — Progress Notes (Signed)
Physical Therapy Weekly Progress Note  Patient Details  Name: Dustin Blanchard MRN: [id number] Date of Birth: 1951/11/14  Beginning of progress report period: August 13, 2022 End of progress report period: August 20, 2022  Today's Date: 08/20/2022 PT Individual Time: 1000-1058 + 2633-3545 PT Individual Time Calculation (min): 58 min  + 59 min  Patient has met 4 of 4 short term goals.  Pt has made excellent progress over this reporting period and is on track to meet his LTG of minA overall. He currently completes bed mobility with supervision, sit<>stand transfers with CGA and HHA, and ambulates >276f with minA and no AD. He continues to require minA for safe stair navigation using 2 hand rails. He is primarily limited by significant R inattention vs field cut, R sided weakness, cognitive impairments, significant expressive aphasia (nonverbal), and decreased insight into his deficits. His wife has been with him daily and has observed him in a few therapy sessions - will continue to involve her during treatments for family education/training to prepare for his scheduled DC date of Tuesday 9/5.  Patient continues to demonstrate the following deficits muscle weakness, decreased cardiorespiratoy endurance, unbalanced muscle activation and motor apraxia, decreased visual perceptual skills and field cut, decreased attention to right and decreased motor planning, decreased attention, decreased awareness, decreased problem solving, decreased safety awareness, and decreased memory, and decreased standing balance, decreased postural control, hemiplegia, and decreased balance strategies and therefore will continue to benefit from skilled PT intervention to increase functional independence with mobility.  Patient progressing toward long term goals..  Continue plan of care.  PT Short Term Goals Week 1:  PT Short Term Goal 1 (Week 1): Pt will complete bed mobility with maxA PT Short Term Goal 1 - Progress (Week  1): Met PT Short Term Goal 2 (Week 1): Pt will complete bed<>chair transfers with maxA and LRAD PT Short Term Goal 2 - Progress (Week 1): Met PT Short Term Goal 3 (Week 1): Pt will ambulate 239fwith maxA and LRAD PT Short Term Goal 3 - Progress (Week 1): Met PT Short Term Goal 4 (Week 1): Pt will initiate stair training PT Short Term Goal 4 - Progress (Week 1): Met Week 2:  PT Short Term Goal 1 (Week 2): STG = LTG due to ELOS  Skilled Therapeutic Interventions/Progress Updates:      1st session: Pt siting in TIS w/c to start with wife at bedside. Pt has B mittens on - removed at start and re-applied at end of session. Pt pleasant and cooperative throughout treatment session. Pt shaking head 'no' to pain and 'yes' to need to toilet. Sit<>stand with CGA/minA and no AD and ambulates to bathroom in room with minA and no AD - max cues for attending to his R and max cues for locating toilet on his R side. Poor safety awareness while turning to sit to the toilet placed on his R side. Pt continent of bladder while sitting on toilet, charted. Returned to TIS w/c in similar manner.  Switched out his TIS w/c for a 16x16 (unable to locate 16x18 or 16x20) manual wheelchair as pt has better postural control and core strength.   [facility] work on functional gait outdoors to address community reintegration and assess safety in unfamiliar environment.   Ambulated variable distances outside, ~15018f ~200f60f~175ft20fh CGA/minA and no AD - primary gait deficits are B knees flexed in stance, decreased step length and clearance on R, strong  R inattention (keeps head turned L), and LOB while turning. Unsure if patient would benefit from AD but will trial this PM session. No functional carryover in R inattention during gait training outdoors despite max cues. Worked on visual scanning R with manual facilitation for head turn R while seated in chair.   Returned upstairs to his room at  wheelchair level and patient remained seated in manual w/c with safety belt alarm on, B mittens on, call bell in reach. LPN made aware of pt status at end of session and recommended that since wife is not at bedside to have frequent check ins to ensure safety.   2nd session: Pt sitting in manual w/c to start with wife at bedside. Pt agreeable to PT tx and no reports of pain. Remains severely expressive aphasic, nonverbal other than a 'grunting' during session. Assisted patient to bathroom with minA ambulatory transfer using RW to assess LRAD for gait. Pt requires minA for managing RW in tight spaces, especially due to his severe R inattention. Pt continent of bladder once on toilet, charted. Ambulated to main rehab gym with RW and CGA, intermittent minA required for guiding RW to his R to avoid obstacles or to maintain straight path as pt keeps his head rotated L while ambulating - unaware of people walking to his R side. In rehab gym, focused remainder of session on NMR for visual scanning to the R and challenging dynamic standing balance as well. Standing reaching to moving target with CGA for balance while unsupported - target biased towards his R side to promote scanning and patient reqiuring max verbal cues and manual facilitation of turning his head R to locate target. Ball toss in standing - tossed to multiple planes with student tech standing in his front, then to his L, and then to his R  - again, pt requiring similar cues to locate student tech on his R side to catch the ball. Horseshoe toss with horseshoes hanging on mirror towards his R - pt unable to locate or turn to retrieve unless facing them - worked on turning to his R to locate and then turning forwards to toss. He was able to toss with his paretic R hand and then pick up the horseshoes from the floor with CGA/minA, while reaching with his R hand - mod cues for safety awareness and for safety approach to avoid falls. Ambulated back to his room,  ~151f, with CGA and RW with similar cues as above. Concluded session in bed with minA for bed mobility, primarily for initiation. All needs met, bed alarm on.    Therapy Documentation Precautions:  Precautions Precautions: Fall Precaution Comments: NG tube, B wrist restraints, pelvic restraint Restrictions Weight Bearing Restrictions: No General:   Vital Signs: Therapy Vitals Temp: 97.6 F (36.4 C) Pulse Rate: 62 Resp: 16 BP: 113/70 Patient Position (if appropriate): Lying Oxygen Therapy SpO2: 100 % O2 Device: Room Air Pain:   Vision/Perception     Mobility:   Locomotion :    Trunk/Postural Assessment :    Balance:   Exercises:   Other Treatments:     Therapy/Group: Individual Therapy  CAlger Simons8/31/2023, 7:36 AM

## 2022-08-20 NOTE — Progress Notes (Signed)
Speech Language Pathology Weekly Progress and Session Note  Patient Details  Name: Dustin Blanchard MRN: [id number] Date of Birth: 04-13-1951  Beginning of progress report period: August 13, 2022 End of progress report period: August 20, 2022  Today's Date: 08/20/2022 SLP Individual Time: 1594-7076 SLP Individual Time Calculation (min): 45 min  Short Term Goals: Week 1: SLP Short Term Goal 1 (Week 1): Pt will maintain alertness for 3 minute intervals given Max A multimodal cues. SLP Short Term Goal 1 - Progress (Week 1): Met SLP Short Term Goal 2 (Week 1): Following oral care, pt will participate in CSE and/or therapeutic PO trials of ice chips and tsp sips of water with minimal s/sx c/f laryngeal invasion in preparation for repeat instrumental swallow study. SLP Short Term Goal 2 - Progress (Week 1): Met SLP Short Term Goal 3 (Week 1): Pt will follow contextual commands x 5 with Max A multimodal cues. SLP Short Term Goal 3 - Progress (Week 1): Met SLP Short Term Goal 4 (Week 1): Pt will answer biographical yes/no questions x 3 given Max A multimodal cues. SLP Short Term Goal 4 - Progress (Week 1): Met SLP Short Term Goal 5 (Week 1): Pt will participate in ongoing cognitive-linguistic assessment to further determine ST POC and aid in d/c planning. SLP Short Term Goal 5 - Progress (Week 1): Met    New Short Term Goals: Week 2: SLP Short Term Goal 1 (Week 2): STG's = LTG's due to ELOS  Weekly Progress Updates: Pt has demonstrated functional gain as evident by meeting 5 out of 5 short-term goals set at time of initial evaluation, though goals were set low (Max A). Pt and family education ongoing; wife has been present for the majority of sessions and actively participating + returning demonstration of education. Pt continues to demonstrate severe aphasia (expressive > receptive), which limits pt's functional communication. Will intermittently answer yes/no questions with head nods; however,  accuracy is variable. Will follow one-step commands with Min-Mod A verbal cues ~80% of the time. Additionally, pt presents with overall mild-moderate oropharyngeal dysphagia s/p MBSS completed on 08/18/2022 with recommendations for cautious initiation of Dysphagia 1 diet textures with nectar-thick liquids with strict adherence to aspiration precautions of slow rate and providing dry spoon intermittently between bites to elicit secondary swallow to clear pharyngeal residuals. Continue to recommend skilled ST intervention during CIR admission, as well as 24/7 supervision + assistance and HH SLP at d/c; pt's wife aware and in agreement with recommendations.  Intensity: Minumum of 1-2 x/day, 30 to 90 minutes Frequency: 3 to 5 out of 7 days Duration/Length of Stay: ELOS 9/5 Treatment/Interventions: Functional tasks;Therapeutic Activities;Patient/family education;Dysphagia/aspiration precaution training   Daily Session  Skilled Therapeutic Interventions:   S: Pt seen this date for skilled ST intervention targeting deglutition and communication goals outlined above. Pt received awake/alert and attempting to get OOB; Posey belt donned. Less emotional this session. Agreeable to ST intervention in hospital room. Transferred from bed to TIS with Min A stand pivot, per safety plan. Wife was not present for today's session.  O: SLP facilitated today's session by providing Max A verbal and visual cues for attention to task and implementation of aspiration precautions, during AM meal consumption, given pt appeared internally and externally distracted. Continues to exhibit R inattention and benefits form clinician placing self within L visual field and at midline. With Mod A for self-feeding and Max A for implementation of aspiration precautions, pt consumed 100% of Mighty Shake and ~15% of pureed  eggs and sausage (placed in Flowsheet). Intermittent throat clearing noted with vocal quality remaining clear post-swallow.  Only one instance of wet vocal quality noted, post-swallow; cleared with verbal cues to produce volitional throat clear. SpO2 and HR obtained after meal with SpO2 reading at 100% and HR 65. Per chart review, intake remains poor. Re: communication, pt continues to intermittently respond to yes/no questions with variable reliability. Followed one-step directives with overall Min A verbal cues. Approximated 3 out of 5 numbers during therapeutic automatic speech task (e.g. counting 1-5). Remains non-verbal. 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   A: Pt appears minimally sitmulable for skilled ST intervention with minimal change/progress noted with verbalizations. Command following for single step directives improved with use of Min A vs Min-Mod and Mod A multimodal cues. Recommend continuation of current diet textures (Dysphagia 1 with nectar-thick liquids) given adherence to strict aspiration precautions. Will plan to trial low-tech AAC options next session.  P: Pt left OOB in TIS with all safety measures activated and b/l mittens donned. Call bell reviewed and within reach and all immediate needs met. Continue per current ST POC next session.    Pain PAINAD (Pain Assessment in Advanced Dementia) Breathing: normal Negative Vocalization: none Facial Expression: smiling or inexpressive Body Language: relaxed Consolability: no need to console PAINAD Score: 0  Therapy/Group: Individual Therapy  Melissa Pulido A Joshuah Minella 08/20/2022, 1:19 PM

## 2022-08-21 NOTE — Progress Notes (Signed)
Physical Therapy Session Note  Patient Details  Name: Dustin Blanchard MRN: [id number] Date of Birth: May 24, 1951  Today's Date: 08/21/2022 PT Individual Time: 1130-1200 + 1300-1355  PT Individual Time Calculation (min): 30 min  + 55 min  Short Term Goals: Week 2:  PT Short Term Goal 1 (Week 2): STG = LTG due to ELOS  Skilled Therapeutic Interventions/Progress Updates:      1st session: Pt sitting in w/c to start with wife at bedside. Discussed DME rec's with wife - RW and transport chair, wife in agreement. Relayed to CSW for ordering. Focused remainder of session on family training/education with wife, Ledon Snare educated on gait belt use and encouraged to use it all times while ambulating. Pt completed sit<>stand to RW with CGA provided by the wife. PT providing supervision and managing IV pole for safety. Wife ambulated with patient and RW (wife providing CGA/minA) and PT supervising and pt ambulates from his room to main rehab gym, ~255ft. PT instruction on guarding on patient's R side, maintaining hand on belt at all times, cueing patient to visually scan R and to promote upright posture. Wife able to direct patient well and seems to understands his deficits.   Wife instructed in stair navigation using 6inch steps and 2 hand rails - educated on guarding technique too. Wife assisted patient to navigate up/down x8 steps with PT providing close supervision and managing IV line. Encouraged wife to have their daughter or another family member present when they first do the steps at home to ensure safety.   [name] ambulated with patient back to his room in similar manner as above. She assisted him back to the w/c which was on his R side. PT needing to assist with cueing patient to safely turn to sit on his R. Educated [name] on patient's tendency to sit prior to being fully over surface, especially when the surface is on his R.   Safety belt alarm on, all needs met at end of session.   2nd  session: Pt sitting in w/c to start with wife at bedside. NT also in room for direct handoff of care. Per NT, pt requesting to use bathroom. Ambulated to bathroom with RW and CGA with minA required for steering RW in tight spaces and mod verbal cues for locating toilet on his R side. Pt continent of bladder while sitting on toilet - LPN made aware for charting purposes. Pt ambulates from his room to outside near [facility] with CGA and RW with standing rest break only in the elevator. CGA provided with intermittent minA for steering RW, especially for R turns. Distance > 542ft. While outdoors, continued to work on gait training, R inattention, R visual scanning, and ambulating on unlevel concrete surfaces. Pt appears to have improved awareness of R inattention vs field cut deficits but still requires cues to attend. Practiced functional transfers to various surfaces (park benches, chairs without UE support, floating chairs) with CGA/minA and RW with continues cues required for safety approach and ensuring proximity prior to sitting. Practiced ambulating around the [facility] of hospital, b/w tables and around various objects to improve awareness and community reintegration. Ambulated back upstairs to his room with CGA and RW, >567ft, standing rest break in elevator. Pt returned to bed with minA for initiation and directing. Bed mobility completed without assist. Bed alarm on, call bell in reach.    Therapy Documentation Precautions:  Precautions Precautions: Fall Precaution Comments: NG tube, B wrist restraints, pelvic restraint Restrictions Weight  Bearing Restrictions: No General:     Therapy/Group: Individual Therapy  Beverely Suen P Osman Calzadilla PT 08/21/2022, 7:34 AM

## 2022-08-21 NOTE — Progress Notes (Signed)
Patient ID: Dustin Blanchard, male   DOB: 1951-04-22, 71 y.o.   MRN: [id number]  gave wife the handicapped application to take to [facility] or License plate agency to get place card. She is planning on getting him a back rest, informed not a covered insurance item. Wife will get on her own.

## 2022-08-21 NOTE — Progress Notes (Signed)
Occupational Therapy Session Note  Patient Details  Name: Dustin Blanchard MRN: [id number] Date of Birth: [birth year]  Today's Date: 08/21/2022 OT Individual Time: 1000-1100 OT Individual Time Calculation (min): 60 min    Short Term Goals: Week 1:  OT Short Term Goal 1 (Week 1): Pt will sit EOB with mod a and cues for simple ADL OT Short Term Goal 2 (Week 1): Pt will bathe UB with max a and mod cues OT Short Term Goal 3 (Week 1): Pt will complete simple grooming task with mod cues with 1/3 step initiation OT Short Term Goal 4 (Week 1): Pt will don pull over shirt wiht max A and mod cues using hemi techniques  Skilled Therapeutic Interventions/Progress Updates:    Patient agreeable to participate in OT session. Reports 0/10 pain level. Wife present in room and reports that pt is having a better day than yesterday so far. Pt with flat effect and somber expression throughout session.   Patient participated in skilled OT session focusing on therapy participation while pt participates in a familiar and routine self care task. Therapist facilitated session while guiding pt through steps and sequencing of grooming (wash face), UB dressing, LB dressing and peri hygiene in order to promote increased activity participation and decrease chance of frustration by pt completing a task that has a chance of success. Pt provided with VC for task sequencing and initiation during all self care tasks. Physical and tactile cues were provided with visual demonstration/modeling of activity when appropriate.     Therapy Documentation Precautions:  Precautions Precautions: Fall Precaution Comments: NG tube, B wrist restraints, pelvic restraint Restrictions Weight Bearing Restrictions: No    Therapy/Group: Individual Therapy  Limmie Patricia, OTR/L,CBIS  Supplemental OT - [facility]  08/21/2022, 8:01 AM

## 2022-08-21 NOTE — Progress Notes (Signed)
Speech Language Pathology Daily Session Note  Patient Details  Name: Dustin Blanchard MRN: [id number] Date of Birth: [birth year]  Today's Date: 08/21/2022 SLP Individual Time: 0901-1000 SLP Individual Time Calculation (min): 59 min  Short Term Goals: Week 2: SLP Short Term Goal 1 (Week 2): STG's = LTG's due to ELOS  Skilled Therapeutic Interventions: Pt seen for skilled ST with focus on speech and swallowing goals, pt received in bathroom with wife and NT. SLP assisting with transfer to wheelchair, hand washing and donning alarm belt by providing mod-max A verbal cues for following directions during functional tasks. SLP facilitating simple yes/no questions with use of combination of low level AAC communication board, gestures and verbal responses by providing overall mod A cues for 60% accuracy. SLP providing patient simple yes/no communication sheet which patient utilized most effectively, appeared distracted by pictures on other sample communication sheets. Pt would mostly respond with "I don't know" when asked simple questions, repetition and verbal cues increased patient's engagement and participation. Pt denying any Dys 1 intake (only ate 25% of breakfast per chart) however was agreeable to a few sips of NTL via straw. Pt tolerating NTL with no overt s/s aspiration provided supervision A verbal cues for small sips. Wife with questions regarding preparation of foods at home for a Dys 1 diet, provided handout on pt's current diet recommendations of Dys 1/no thin liquids and all questions answered. Pt left in wheelchair with gait belt intact and wife present for needs, cont ST POC.   Pain Pain Assessment Pain Scale: Faces Faces Pain Scale: No hurt  Therapy/Group: Individual Therapy  Tacey Ruiz 08/21/2022, 9:58 AM

## 2022-08-21 NOTE — Progress Notes (Signed)
PROGRESS NOTE   Subjective/Complaints: No new complaints this morning Appreciate SW giving him a handicap placard Discussed Remeron, that it can help increase appetite  ROS: Unable to obtain given mental status.   Objective:   No results found.  No results for input(s): "WBC", "HGB", "HCT", "PLT" in the last 72 hours.  No results for input(s): "NA", "K", "CL", "CO2", "GLUCOSE", "BUN", "CREATININE", "CALCIUM" in the last 72 hours.   Intake/Output Summary (Last 24 hours) at 08/21/2022 1335 Last data filed at 08/21/2022 0731 Gross per 24 hour  Intake 117 ml  Output --  Net 117 ml         Physical Exam: Vital Signs Blood pressure (!) 155/89, pulse (!) 49, temperature 98.1 F (36.7 C), temperature source Oral, resp. rate 17, height 6' (1.829 m), weight 64.1 kg, SpO2 100 %. Gen: no distress, normal appearing, BMI 19.29, somnolent HEENT: oral mucosa pink and moist, NCAT Cardio: Bradycardia Chest: CTAB, normal effort, normal rate of breathing Abd: soft, non-distended, +BS Ext: no edema Psych: tearful regarding situation Skin: intact Neurological:     Comments: Patient is alert.  Right facial droop. Globally aphasic.  He would follow some simple motor commands with significant prompting.  Severe right sided inattention Mod A for lower body dressing   Assessment/Plan: 1. Functional deficits which require 3+ hours per day of interdisciplinary therapy in a comprehensive inpatient rehab setting. Physiatrist is providing close team supervision and 24 hour management of active medical problems listed below. Physiatrist and rehab team continue to assess barriers to discharge/monitor patient progress toward functional and medical goals  Care Tool:  Bathing    Body parts bathed by patient: Right arm, Left arm, Chest, Abdomen, Front perineal area, Right upper leg, Left upper leg, Right lower leg, Left lower leg, Face   Body  parts bathed by helper: Buttocks     Bathing assist Assist Level: Minimal Assistance - Patient > 75%     Upper Body Dressing/Undressing Upper body dressing   What is the patient wearing?: Pull over shirt    Upper body assist Assist Level: Moderate Assistance - Patient 50 - 74%    Lower Body Dressing/Undressing Lower body dressing      What is the patient wearing?: Pants, Incontinence brief     Lower body assist Assist for lower body dressing: Minimal Assistance - Patient > 75%     Toileting Toileting    Toileting assist Assist for toileting: Minimal Assistance - Patient > 75%     Transfers Chair/bed transfer  Transfers assist  Chair/bed transfer activity did not occur: Safety/medical concerns  Chair/bed transfer assist level: Minimal Assistance - Patient > 75%     Locomotion Ambulation   Ambulation assist   Ambulation activity did not occur: Safety/medical concerns  Assist level: 2 helpers Assistive device: Hand held assist Max distance: 134ft   Walk 10 feet activity   Assist  Walk 10 feet activity did not occur: Safety/medical concerns  Assist level: 2 helpers Assistive device: Hand held assist   Walk 50 feet activity   Assist Walk 50 feet with 2 turns activity did not occur: Safety/medical concerns  Assist level: Minimal Assistance - Patient >  75%      Walk 150 feet activity   Assist Walk 150 feet activity did not occur: Safety/medical concerns  Assist level: Minimal Assistance - Patient > 75% Assistive device: Hand held assist    Walk 10 feet on uneven surface  activity   Assist Walk 10 feet on uneven surfaces activity did not occur: Safety/medical concerns         Wheelchair     Assist Is the patient using a wheelchair?: Yes Type of Wheelchair: Manual Wheelchair activity did not occur: Safety/medical concerns         Wheelchair 50 feet with 2 turns activity    Assist    Wheelchair 50 feet with 2 turns  activity did not occur: Safety/medical concerns       Wheelchair 150 feet activity     Assist  Wheelchair 150 feet activity did not occur: Safety/medical concerns       Blood pressure (!) 155/89, pulse (!) 49, temperature 98.1 F (36.7 C), temperature source Oral, resp. rate 17, height 6' (1.829 m), weight 64.1 kg, SpO2 100 %.  Medical Problem List and Plan: 1. 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  Atrial fibrillation with RVR: Eliquis initiated 08/10/2022. Continue amiodarone 200mg  daily             -antiplatelet therapy: N/A 3. Pain: n/a 4. 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 Add melatonin 3mg  HS             -antipsychotic agents: Seroquel 25 mg daily with supper and 12.5 mg 3 times daily as needed. 5. Neuropsych/cognition: This patient is not capable of making decisions on his own behalf. 6. Skin/Wound Care: Routine skin checks 7. Fluids/Electrolytes/Nutrition: Routine in and outs with follow-up chemistries 8.  New onset atrial fibrillation RVR/CHF.  Follow-up cardiology service.  Amiodarone as directed, Aldactone 12.5 mg daily. Add magnesium supplement 250mg  gluconate HS. Cardiac rate controlled.  Plan outpatient ischemic work-up after recovery from CVA 9.  Dysphagia.  NPO.  Alternative means of nutritional support.  Follow-up speech therapy. Discussed eval with SLP today before replacing NGT 10.  Hyperlipidemia.  Continue Lipitor 11. Suboptimal vitamin D level: start 800U daily 12. Hyperglycemia: likely secondary to tube feeds, hemoglobin A1c reviewed  and is 4.6, continue to monitor. Does not need CBG checks.  13. Right lower lobe pneumonia: continue Ancef 14. Worsening lethargy: CT head ordered and stable, improved, likely secondary to pneumonia 15. Bradycardia: will reach out to cardiology to see if Amiodarone can be decreased  -Spoke with cardiology 8/26, will decrease amiodarone to 200daily, appreciate assistance   8/27 Continues to have episodic bradycardia, Cardiology advises as long as HR increased with activity appropriately and no sx HR in 50s, no further action needed 16. UTI: on Ancef, f/u UC- 7000 CFU e cloacae resistant to cefazolin  -Foley draining clear yellow urine, only 7000CFU-likely more contaminant- continue to monitor  -8/27 leukocytosis resolved on CBC  today  Repeat WBC on Monday 18. Chronic BPH: flomax restarted HS, continue 19. Depression: replace Prozac with Remeron to help stimulate appetite, discussed with wife.  20. Decreased appetite: continue Remeron 7.5mg  HS, discussed with wife.   LOS: 9 days A FACE TO FACE EVALUATION WAS PERFORMED  Isabelle Matt P Brandelyn Henne 08/21/2022, 1:35 PM

## 2022-08-22 LAB — BASIC METABOLIC PANEL
Anion gap: 6 (ref 5–15)
BUN: 9 mg/dL (ref 8–23)
CO2: 29 mmol/L (ref 22–32)
Calcium: 9.2 mg/dL (ref 8.9–10.3)
Chloride: 109 mmol/L (ref 98–111)
Creatinine, Ser: 0.94 mg/dL (ref 0.61–1.24)
GFR, Estimated: >60 mL/min (ref >60)
Glucose, Bld: 113 mg/dL — ABNORMAL HIGH (ref 70–99)
Potassium: 3.6 mmol/L (ref 3.5–5.1)
Sodium: 144 mmol/L (ref 135–145)

## 2022-08-22 NOTE — Progress Notes (Signed)
Pt very confused this shift. Pulled IV and Posey belt out and got up ambulated in room to hall way area. Pt refused to go to bed. Pt was up in his wheelchair in the nursing station for observation due to fall precaution. Pt could not receive continues normal saline due to agitation and confusion. New IV consult obtained to start fluids.

## 2022-08-22 NOTE — Progress Notes (Addendum)
Speech Language Pathology Daily Session Note  Patient Details  Name: Dustin Blanchard MRN: [id number] Date of Birth: Jan 28, 1951  Today's Date: 08/22/2022 SLP Individual Time: 9381-0175 SLP Individual Time Calculation (min): 45 min  Short Term Goals: Week 2: SLP Short Term Goal 1 (Week 2): STG's = LTG's due to ELOS  Skilled Therapeutic Interventions:   S: Pt seen this date for skilled ST intervention targeting deglutition and communication/language goals outlined in care plan. Pt received awake/alert and sitting upright in bed. Agreeable to ST intervention and getting OOB and brushing teeth via head nod.Total A for donning TED hose and Max A for socks for time management. Transferred from bed to w/c with Min A. Completed oral care at w/c level given Mod-Max A for sequencing, initiation, motor planning, attention, and problem-solving. [name]/caregiver present for today's session. Per chart review, pt with improved PO intake last evening (80%). Decrease in emotional lability noted.   O: SLP facilitated today's session by providing controlled PO trials of nectar-thick liquid via straw; pt shook head no when asked if he would like something to eat (pt just ate breakfast). No clinical s/sx concerning for airway invasion observed with intake of nectar-thick liquid via cup and straw. Per chart review, VSS and labs WNL. Re: language tx, pt followed basic + functional one-step commands within a known context on ~80% of opportunities given Min A visual cues. Remains essentially non-verbal; however, pt did verbalize "I do not want you to wipe," when SLP asked it pf would like to be wiped after toileting (continent of urine with no BM). Answered yes/no questions more readily with use of low-tech AAC yes/no board, particularly when asked questions re: self and immediate environment; however, accuracy for yes/no responses remains ~50%. SLP provided further skilled education to pt's wife/caregiver re: anticipation for  pt to discharge on Dysphagia 1 textures with nectar-thick liquids. Re-educated pt's wife on how to thicken pt's preferred thin liquids to nectar consistency with pt's wife returning demonstration of education. Also provided verbal education on how to puree homemade meals at home, and will work to provide pt's wife with handout. Reviewed aspiration precautions and that nectar-thick liquids should not be chilled with ice, but rather in the refrigerator. Provided nectar-thick [employer] packets and provided purchasing information. Also, provided additional information re: use of low-tech AAC boards (picture and yes/no boards) to facilitate comprehension and expression and level of cueing that may be needed for pt to follow commands, initiate, motor plan, and sequence steps during functional tasks; pt's wife observed SLP perform cueing hierarchy and verbalized understanding of all information.   A: Pt appeared minimally sitmulable for skilled ST intervention. Continue to recommend current diet textures (Dysphagia 1 textures with nectar-thick liquid) with full supervision and assistance + adherence to aspiration precautions posted at [facility].  P: Pt left OOB in w/c with all safety measures activated and wife/caregiver present. All immediate needs met. Continue per current ST POC next session.   Pain PAINAD (Pain Assessment in Advanced Dementia) Breathing: normal Negative Vocalization: none Facial Expression: smiling or inexpressive Body Language: relaxed Consolability: no need to console PAINAD Score: 0  Therapy/Group: Individual Therapy  Delana Manganello A Fishel Wamble 08/22/2022, 3:18 PM

## 2022-08-22 NOTE — Progress Notes (Signed)
Occupational Therapy Weekly Progress Note  Patient Details  Name: Dustin Blanchard MRN: [id number] Date of Birth: 13-Dec-1951  Beginning of progress report period: August 13, 2022 End of progress report period: August 22, 2022  Today's Date: 08/22/2022 OT Individual Time: 1126-1212 OT Individual Time Calculation (min): 46 min    OT Missed Time: 29 Minutes Missed Time Reason: Other (comment) (facility related emergency)   Patient has met 2 of 4 short term goals.  Pt has improved his functional performance during bathing, dressing, grooming, toileting, and functional transfers. Due to aphasia, pt requires consistent supervision; wife has been present during therapy sessions for on-going family training and assists with BADL tasks and functional mobility in the room. Pt is able to navigate with family and therapy using hand held assist. Mod-max multimodel cues are needed to complete basic ADL tasks. Pt's emotions have been high and low in the past few days as he seems to have increased awareness that he is unable to verbally communicate effectively at times and is frustrated.   Patient continues to demonstrate the following deficits: muscle weakness, impaired timing and sequencing, unbalanced muscle activation, motor apraxia, decreased coordination, and decreased motor planning, decreased visual perceptual skills and decreased visual motor skills, decreased attention to right and decreased motor planning, decreased initiation, decreased attention, decreased awareness, decreased problem solving, decreased safety awareness, decreased memory, and delayed processing, and decreased standing balance, hemiplegia, and decreased balance strategies and therefore will continue to benefit from skilled OT intervention to enhance overall performance with BADL and Reduce care partner burden.  Patient progressing toward long term goals..  Continue plan of care.  OT Short Term Goals Week 1:  OT Short Term Goal 1  (Week 1): Pt will sit EOB with mod a and cues for simple ADL OT Short Term Goal 1 - Progress (Week 1): Met OT Short Term Goal 2 (Week 1): Pt will bathe UB with max a and mod cues OT Short Term Goal 2 - Progress (Week 1): Met OT Short Term Goal 3 (Week 1): Pt will complete simple grooming task with mod cues with 1/3 step initiation OT Short Term Goal 3 - Progress (Week 1): Partly met OT Short Term Goal 4 (Week 1): Pt will don pull over shirt wiht max A and mod cues using hemi techniques OT Short Term Goal 4 - Progress (Week 1): Progressing toward goal Week 2:  OT Short Term Goal 1 (Week 2): STG = LTG (due to ELOS)  Skilled Therapeutic Interventions/Progress Updates:    Patient agreeable to participate in OT session. Reports 0/10 pain level.   Patient participated in skilled OT session focusing on ADL re-training at sink level followed by functional mobility in hallway of rehab department. Therapist facilitated session with patient completing LB dressing (pants, socks, and shoes) at sink level, seated in w/c  in order to improve/ participation and initiation in familiar and basic ADL tasks. Pt was able to demonstrate improved emergent awareness during dressing while placing each leg in the correct pant leg and donning socks. Min VC provided during session for task sequencing if needed. More cues needed to place correct foot in shoe. While completing functional mobility task in hallway, therapist provided patient tactile cueing to keep shoulder up and back for improved upright posture. VC provided to for directional navigation and room location.     Therapy Documentation Precautions:  Precautions Precautions: Fall Precaution Comments: B wrist restraints (when wife is not present), pelvic restraint Restrictions Weight Bearing Restrictions: No  Therapy/Group: Individual Therapy  Ailene Ravel, OTR/L,CBIS  Supplemental OT - [facility]  08/22/2022, 4:30 PM

## 2022-08-22 NOTE — Progress Notes (Signed)
PROGRESS NOTE   Subjective/Complaints:  Pt woke briefly- sleepy- Per nursing, got OOB searching for wife after she left today for a break.   BUN was 18 and Cr 0.8- still has IVF running- last labs 8/27- decided to recheck labs to see if could d/c IVFs, esp if going into enclosure bed.    ROS: limited due to cognition  Objective:   No results found.  No results for input(s): "WBC", "HGB", "HCT", "PLT" in the last 72 hours.  Recent Labs    08/22/22 0934  NA 144  K 3.6  CL 109  CO2 29  GLUCOSE 113*  BUN 9  CREATININE 0.94  CALCIUM 9.2     Intake/Output Summary (Last 24 hours) at 08/22/2022 1724 Last data filed at 08/22/2022 1300 Gross per 24 hour  Intake 477 ml  Output --  Net 477 ml         Physical Exam: Vital Signs Blood pressure 111/68, pulse (!) 56, temperature 97.6 F (36.4 C), resp. rate 15, height 6' (1.829 m), weight 64.1 kg, SpO2 100 %.    General: awoke briefly; sitting u in bed; has IVFs running 50cc/hour; NAD HENT: conjugate gaze; oropharynx moist CV: regular to bradycardic  rate; no JVD Pulmonary: CTA B/L; no W/R/R- good air movement GI: soft, NT, ND, (+)BS Psychiatric: calm this AM- got restless per nursing.  Neurological: Ox1- but sleepy this AM Skin: intact Neurological:     Comments: Patient is alert.  Right facial droop. Globally aphasic.  He would follow some simple motor commands with significant prompting.  Severe right sided inattention Mod A for lower body dressing   Assessment/Plan: 1. Functional deficits which require 3+ hours per day of interdisciplinary therapy in a comprehensive inpatient rehab setting. Physiatrist is providing close team supervision and 24 hour management of active medical problems listed below. Physiatrist and rehab team continue to assess barriers to discharge/monitor patient progress toward functional and medical goals  Care Tool:  Bathing     Body parts bathed by patient: Right arm, Left arm, Chest, Abdomen, Front perineal area, Right upper leg, Left upper leg, Right lower leg, Left lower leg, Face   Body parts bathed by helper: Buttocks     Bathing assist Assist Level: Minimal Assistance - Patient > 75%     Upper Body Dressing/Undressing Upper body dressing   What is the patient wearing?: Pull over shirt    Upper body assist Assist Level: Moderate Assistance - Patient 50 - 74%    Lower Body Dressing/Undressing Lower body dressing      What is the patient wearing?: Pants, Incontinence brief     Lower body assist Assist for lower body dressing: Minimal Assistance - Patient > 75%     Toileting Toileting    Toileting assist Assist for toileting: Minimal Assistance - Patient > 75%     Transfers Chair/bed transfer  Transfers assist  Chair/bed transfer activity did not occur: Safety/medical concerns  Chair/bed transfer assist level: Minimal Assistance - Patient > 75%     Locomotion Ambulation   Ambulation assist   Ambulation activity did not occur: Safety/medical concerns  Assist level: 2 helpers Assistive device: Hand held  assist Max distance: 161ft   Walk 10 feet activity   Assist  Walk 10 feet activity did not occur: Safety/medical concerns  Assist level: 2 helpers Assistive device: Hand held assist   Walk 50 feet activity   Assist Walk 50 feet with 2 turns activity did not occur: Safety/medical concerns  Assist level: Minimal Assistance - Patient > 75%      Walk 150 feet activity   Assist Walk 150 feet activity did not occur: Safety/medical concerns  Assist level: Minimal Assistance - Patient > 75% Assistive device: Hand held assist    Walk 10 feet on uneven surface  activity   Assist Walk 10 feet on uneven surfaces activity did not occur: Safety/medical concerns         Wheelchair     Assist Is the patient using a wheelchair?: Yes Type of Wheelchair:  Manual Wheelchair activity did not occur: Safety/medical concerns         Wheelchair 50 feet with 2 turns activity    Assist    Wheelchair 50 feet with 2 turns activity did not occur: Safety/medical concerns       Wheelchair 150 feet activity     Assist  Wheelchair 150 feet activity did not occur: Safety/medical concerns       Blood pressure 111/68, pulse (!) 56, temperature 97.6 F (36.4 C), resp. rate 15, height 6' (1.829 m), weight 64.1 kg, SpO2 100 %.  Medical Problem List and Plan: 1. 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  Atrial fibrillation with RVR: Eliquis initiated 08/10/2022. Continue amiodarone 200mg  daily             -antiplatelet therapy: N/A 3. Pain: n/a 4. 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 Add melatonin 3mg  HS             -antipsychotic agents: Seroquel 25 mg daily with supper and 12.5 mg 3 times daily as needed. 5. Neuropsych/cognition: This patient is not capable of making decisions on his own behalf. 6. Skin/Wound Care: Routine skin checks 7. Fluids/Electrolytes/Nutrition: Routine in and outs with follow-up chemistries  9/2- stop IVFs- BUN 9 and Cr 0.94- so doing great- don't think needs IVFs anymore 8.  New onset atrial fibrillation RVR/CHF.  Follow-up cardiology service.  Amiodarone as directed, Aldactone 12.5 mg daily. Add magnesium supplement 250mg  gluconate HS. Cardiac rate controlled.  Plan outpatient ischemic work-up  after recovery from CVA 9.  Dysphagia.  NPO.  Alternative means of nutritional support.  Follow-up speech therapy. Discussed eval with SLP today before replacing NGT 10.  Hyperlipidemia.  Continue Lipitor 11. Suboptimal vitamin D level: start 800U daily 12. Hyperglycemia: likely secondary to tube feeds, hemoglobin A1c reviewed and is 4.6, continue to monitor. Does not need CBG checks.  13. Right lower lobe pneumonia: continue Ancef 14. Worsening lethargy: CT head ordered and stable,  improved, likely secondary to pneumonia 15. Bradycardia: will reach out to cardiology to see if Amiodarone can be decreased  -Spoke with cardiology 8/26, will decrease amiodarone to 200daily, appreciate assistance   8/27 Continues to have episodic bradycardia, Cardiology advises as long as HR increased with activity appropriately and no sx HR in 50s, no further action needed 16. UTI: on Ancef, f/u UC- 7000 CFU e cloacae resistant to cefazolin  -Foley draining clear yellow urine, only 7000CFU-likely more contaminant- continue to monitor  -8/27 leukocytosis resolved on CBC today  Repeat WBC on Monday 18. Chronic BPH: flomax restarted HS, continue 19. Depression: replace Prozac with Remeron to help stimulate appetite, discussed with wife.  20. Decreased appetite: continue Remeron 7.5mg  HS, discussed with wife.  21. Agitation, unsafe- getting OOB  9/2- will order enclosure bed- when wife isn't here, pt gets restless/agitated, and needs to be kept safe   I spent a total of 52   minutes on total care today- >50% coordination of care- due to d/w nursing x4 on Saturday about BUN/Cr, IVFs and enclosure bed.    LOS: 10 days A FACE TO FACE EVALUATION WAS PERFORMED  Jocsan Mcginley 08/22/2022, 5:24 PM

## 2022-08-23 MED ORDER — SORBITOL 70 % SOLN
45.0000 mL | Freq: Once | Status: AC
  Administered 2022-08-23: 45 mL via ORAL
  Filled 2022-08-23: qty 60

## 2022-08-23 MED ORDER — POLYETHYLENE GLYCOL 3350 17 G PO PACK
17.0000 g | PACK | Freq: Every day | ORAL | Status: DC
  Administered 2022-08-23 – 2022-08-25 (×3): 17 g via ORAL
  Filled 2022-08-23 (×3): qty 1

## 2022-08-23 NOTE — Progress Notes (Signed)
Speech Language Pathology Daily Session Note  Patient Details  Name: Dustin Blanchard MRN: [id number] Date of Birth: 1951-11-11  Today's Date: 08/23/2022 SLP Individual Time: 8841-6606 SLP Individual Time Calculation (min): 75 min  Short Term Goals: Week 2: SLP Short Term Goal 1 (Week 2): STG's = LTG's due to ELOS  Skilled Therapeutic Interventions:  Pt was seen for skilled ST targeting cognitive goals.  Pt was received in the hallway with nursing.  Per report, pt has been tearful and restless all day and has spent a great deal of time walking around the unit with staff.  SLP walked with pt to treatment room for change of scenery to maximize participation in treatment.  SLP facilitated the session with a pipe tree task targeting attention.  Pt could deconstruct and recreate PVC pipe structures from pictures with max assist for functional problem solving due to right inattention.  He sustained his attention to tasks for  >5 minute intervals with min cues for redirection.  When ambulating back to his room, pt expressed reluctance to sit in either the recliner or bed through his gestures and facial expressions.  Pt did request to go to the bathroom via pointing and needed mod-max cues for safety when navigating the rolling walker around obstacles due to no awareness of objects on his right side.  After going to the bathroom, the pt became increasingly frustrated with therapist's attempts to direct him to either the bed or his recliner.  He eventually walked back out into the hallway where he was intercepted by his nurse tech.  Nurse tech redirected pt by informing him that his wife will be back shortly and suggested that pt should return to room in preparation for dinner meal.  Pt was left in his room with nursing at bedside.  Continue per current plan of care.    Pain Pain Assessment Pain Scale: 0-10 Pain Score: 0-No pain  Therapy/Group: Individual Therapy  Gregory Dowe, Melanee Spry 08/23/2022, 5:09 PM

## 2022-08-23 NOTE — Progress Notes (Signed)
PROGRESS NOTE   Subjective/Complaints:  Pt awake- sitting up in w/c at bedside- wife in room Per wife, taken to nurses staiton last night- worked well to calm pt down less agitated.  Not in enclosure bed- just waist restraint- since wife here, calm and happy- per wife, pt is "used to walking around" and was walked by therapy and nursing yesterday.   ROS: limited due to cognition and aphasia  Objective:   No results found.  No results for input(s): "WBC", "HGB", "HCT", "PLT" in the last 72 hours.  Recent Labs    08/22/22 0934  NA 144  K 3.6  CL 109  CO2 29  GLUCOSE 113*  BUN 9  CREATININE 0.94  CALCIUM 9.2     Intake/Output Summary (Last 24 hours) at 08/23/2022 1429 Last data filed at 08/23/2022 1243 Gross per 24 hour  Intake 357 ml  Output --  Net 357 ml         Physical Exam: Vital Signs Blood pressure 107/62, pulse (!) 50, temperature (!) 97.5 F (36.4 C), resp. rate 15, height 6' (1.829 m), weight 64.5 kg, SpO2 99 %.     General: awake, alert, calm; wife at bedside; in bedside w/c; NAD HENT: conjugate gaze; oropharynx moist CV: bradycardic  rate; no JVD Pulmonary: CTA B/L; no W/R/R- good air movement GI: soft, NT, ND, (+)BS Psychiatric: calm- smiling with wife here Neurological: aphasic, nodding head yes and no only Skin: intact Neurological:     Comments: Patient is alert.  Right facial droop. Globally aphasic.  He would follow some simple motor commands with significant prompting.  Severe right sided inattention Mod A for lower body dressing   Assessment/Plan: 1. Functional deficits which require 3+ hours per day of interdisciplinary therapy in a comprehensive inpatient rehab setting. Physiatrist is providing close team supervision and 24 hour management of active medical problems listed below. Physiatrist and rehab team continue to assess barriers to discharge/monitor patient progress toward  functional and medical goals  Care Tool:  Bathing    Body parts bathed by patient: Right arm, Left arm, Chest, Abdomen, Front perineal area, Right upper leg, Left upper leg, Right lower leg, Left lower leg, Face   Body parts bathed by helper: Buttocks     Bathing assist Assist Level: Minimal Assistance - Patient > 75%     Upper Body Dressing/Undressing Upper body dressing   What is the patient wearing?: Pull over shirt    Upper body assist Assist Level: Moderate Assistance - Patient 50 - 74%    Lower Body Dressing/Undressing Lower body dressing      What is the patient wearing?: Pants, Incontinence brief     Lower body assist Assist for lower body dressing: Minimal Assistance - Patient > 75%     Toileting Toileting    Toileting assist Assist for toileting: Minimal Assistance - Patient > 75%     Transfers Chair/bed transfer  Transfers assist  Chair/bed transfer activity did not occur: Safety/medical concerns  Chair/bed transfer assist level: Minimal Assistance - Patient > 75%     Locomotion Ambulation   Ambulation assist   Ambulation activity did not occur: Safety/medical concerns  Assist  level: 2 helpers Assistive device: Hand held assist Max distance: 184ft   Walk 10 feet activity   Assist  Walk 10 feet activity did not occur: Safety/medical concerns  Assist level: 2 helpers Assistive device: Hand held assist   Walk 50 feet activity   Assist Walk 50 feet with 2 turns activity did not occur: Safety/medical concerns  Assist level: Minimal Assistance - Patient > 75%      Walk 150 feet activity   Assist Walk 150 feet activity did not occur: Safety/medical concerns  Assist level: Minimal Assistance - Patient > 75% Assistive device: Hand held assist    Walk 10 feet on uneven surface  activity   Assist Walk 10 feet on uneven surfaces activity did not occur: Safety/medical concerns         Wheelchair     Assist Is the patient  using a wheelchair?: Yes Type of Wheelchair: Manual Wheelchair activity did not occur: Safety/medical concerns         Wheelchair 50 feet with 2 turns activity    Assist    Wheelchair 50 feet with 2 turns activity did not occur: Safety/medical concerns       Wheelchair 150 feet activity     Assist  Wheelchair 150 feet activity did not occur: Safety/medical concerns       Blood pressure 107/62, pulse (!) 50, temperature (!) 97.5 F (36.4 C), resp. rate 15, height 6' (1.829 m), weight 64.5 kg, SpO2 99 %.  Medical Problem List and Plan: 1. 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  Atrial fibrillation with RVR: Eliquis initiated 08/10/2022. Continue amiodarone 200mg  daily             -antiplatelet therapy: N/A 3. Pain: n/a 4. 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 Add melatonin 3mg  HS             -antipsychotic agents: Seroquel 25 mg daily with supper and 12.5 mg 3 times daily as needed. 5. Neuropsych/cognition: This patient is not capable of making decisions on his own behalf. 6. Skin/Wound Care: Routine skin checks 7. Fluids/Electrolytes/Nutrition: Routine in and outs with follow-up chemistries  9/2- stop IVFs- BUN 9 and Cr 0.94- so doing great- don't think needs IVFs anymore 8.  New onset atrial fibrillation RVR/CHF.  Follow-up cardiology service.  Amiodarone as directed, Aldactone 12.5 mg daily. Add magnesium supplement 250mg  gluconate HS. Cardiac rate controlled.  Plan outpatient  ischemic work-up after recovery from CVA 9.  Dysphagia.  NPO.  Alternative means of nutritional support.  Follow-up speech therapy. Discussed eval with SLP today before replacing NGT 10.  Hyperlipidemia.  Continue Lipitor 11. Suboptimal vitamin D level: start 800U daily 12. Hyperglycemia: likely secondary to tube feeds, hemoglobin A1c reviewed and is 4.6, continue to monitor. Does not need CBG checks.  13. Right lower lobe pneumonia: continue Ancef  14. Worsening lethargy: CT head ordered and stable, improved, likely secondary to pneumonia 15. Bradycardia: will reach out to cardiology to see if Amiodarone can be decreased  -Spoke with cardiology 8/26, will decrease amiodarone to 200daily, appreciate assistance   8/27 Continues to have episodic bradycardia, Cardiology advises as long as HR increased with activity appropriately and no sx HR in 50s, no further action needed 16. UTI: on Ancef, f/u UC- 7000 CFU e cloacae resistant to cefazolin  -Foley draining clear yellow urine, only 7000CFU-likely more contaminant- continue to monitor  -8/27 leukocytosis resolved on CBC today  Repeat WBC on Monday 18. Chronic BPH: flomax restarted HS, continue 19. Depression: replace Prozac with Remeron to help stimulate appetite, discussed with wife.  20. Decreased appetite: continue Remeron 7.5mg  HS, discussed with wife.  21. Agitation, unsafe- getting OOB  9/2- will order enclosure bed- when wife isn't here, pt gets restless/agitated, and needs to be kept safe  9/3- decided on waist restraints ot keep in bed- not enclosure bed-    I spent a total of 36   minutes on total care today- >50% coordination of care- due to d/w wife about care and   LOS: 11 days A FACE TO FACE EVALUATION WAS PERFORMED  Dustin Blanchard 08/23/2022, 2:29 PM

## 2022-08-24 NOTE — Progress Notes (Signed)
Physical Therapy Session Note  Patient Details  Name: Dustin Blanchard MRN: [id number] Date of Birth: 12-Oct-1951  Today's Date: 08/24/2022 PT Individual Time: 5885-0277 + 1415-1443 PT Individual Time Calculation (min): 72 min  + 28 min  Short Term Goals: Week 2:  PT Short Term Goal 1 (Week 2): STG = LTG due to ELOS  Skilled Therapeutic Interventions/Progress Updates:      1st session: Pt supine in bed to start with NT supervising meal. Direct handoff of care with patient agreeable to PT tx. Shakes head 'no' to pain.   With HOB flat, pt completed supine<>sitting with supervision. Sit<>Stand with close supervision and no AD. Ambulates with CGA and no AD to bathroom and pt with improved awareness of recognizing toilet on his R side. Pt continent of bladder while sitting on toilet. Ambulated sinkside with CGA and pt washed face and used mouthwash with setupA.   Ambulated ~113f with CGA and RW to main rehab gym - verbal cues for RW management and visual scanning to R to avoid objects/obstacles. Required occasional assist for steering/guiding RW to initiate turns/directions. Stair training with 6 inch steps, 2 hand rails, forward facing, reciprocal stepping, CGA overall for navigating 12 steps. Pt with improved safety awareness and R sided awareness while navigating steps.   Ambulated ~157fwith CGA and RW to ortho rehab gym - cues for increasing upright posture due to forward flexed trunk - ability to initiate but lacks full upright and then quickly returns to flexed posture. Car transfer completed with CGA and RW with car height simulating typical sedan.   UE ergometer completed in standing to challenge endurance - 4 min forwards + 4 min backwards - focused on RUE integration and awareness during activity. Completed at L2 resistance.   Ambulated back to his room, ~20047fwith close supervision and RW - similar cues as above for correcting postural alignment and turning to the R. Concluded session  seated in w/c with safety belt alarm on. All needs met with wife at bedside.    2nd session: Pt sitting in w/c to start with wife at bedside. Focused first part of session on education for DC planning, home safety, fall prevention, management at home. Recommended hiding car keys, installing security cameras or monitors, door alarms, etc to assist with monitoring patient at home. Discussed importance of continued mobility with f/u HHPT. Wife voiced understanding of all.  Pt completed sit<>stand from w/c with CGA and no AD. Ambulated ~100f52f day room rehab gym with no AD and CGA/minA, veering R with decreased R sided step length and stance time.   Assisted onto Nustep and he completed 2 minutes at level 3 resistance using BUE/BLE to integrate R side to improve awareness. Pt impulsively standing from machine, unsure reason. Redirected in shooting basketball to address dynamic standing balance - able to retrieve ball with minA and pick ball up from floor with minA as well. Pt instructed in "dribbling" the basketball back to his room, ~100ft70fth minA and no AD - several minor LOB during this, especially when he attempted to correct himself with dribbling or to fetch the ball.  Concluded session seated in w/c with all needs met, wife at bedside.    Therapy Documentation Precautions:  Precautions Precautions: Fall Precaution Comments: B wrist restraints (when wife is not present), pelvic restraint Restrictions Weight Bearing Restrictions: No General:     Therapy/Group: Individual Therapy  ChrisAlger Simons2023, 7:34 AM

## 2022-08-24 NOTE — Progress Notes (Signed)
Attempted to give pt enema at this time, pt kept shaking his head and saying "no". Pt refusing enema at this time. Pt educated on importance of intervention. Senna given. Will notify next shift. Mylo Red, LPN

## 2022-08-24 NOTE — Progress Notes (Signed)
Inpatient Rehabilitation Care Coordinator Discharge Note   Patient Details  Name: Dustin Blanchard MRN: [id number] Date of Birth: 1951-02-27   Discharge location: HOME WITH WIFE AND CHILDREN TO ASSIST 24/7 CARE  Length of Stay: 13 DAYS  Discharge activity level: SUPERVISION-CGA LEVEL  Home/community participation: ACTIVE  Patient response Dustin Blanchard Literacy - How often do you need to have someone help you when you read instructions, pamphlets, or other written material from your doctor or pharmacy?: Patient unable to respond  Patient response PT:Dustin Blanchard Isolation - How often do you feel lonely or isolated from those around you?: Patient unable to respond  Services provided included: MD, RD, PT, OT, SLP, RN, CM, Pharmacy, SW  Financial Services:  Field seismologist Utilized: Private Insurance [employer] MEDICARE  Choices offered to/list presented to: WIFE  Follow-up services arranged:  Home Health, DME, Patient/Family has no preference for HH/DME agencies Home Health Agency: [facility]  PT  OT  SP    DME : [facility]  IT sales professional AND 3 IN 1 AND ROLLING WALKER    Patient response to transportation need: Is the patient able to respond to transportation needs?: Yes In the past 12 months, has lack of transportation kept you from medical appointments or from getting medications?: No In the past 12 months, has lack of transportation kept you from meetings, work, or from getting things needed for daily living?: No    Comments (or additional information):WIFE WAS HERE DAILY AND DID HANDS ON CARE. SHE IS COMFORTABLE WITH HUSBAND'S CARE AT DC. THEIR CHILDREN ALSO TO ASSIST  Patient/Family verbalized understanding of follow-up arrangements:  Yes  Individual responsible for coordination of the follow-up plan: Dustin Blanchard  [phone]  Confirmed correct DME delivered: Dustin Blanchard 08/24/2022    Dustin Blanchard, Dustin Blanchard

## 2022-08-24 NOTE — Discharge Summary (Signed)
Physical Therapy Discharge Summary  Patient Details  Name: Dustin Blanchard MRN: [id number] Date of Birth: 10/03/1951  Date of Discharge from PT service:August 24, 2022   Patient has met 9 of 9 long term goals due to improved activity tolerance, improved balance, improved postural control, increased strength, ability to compensate for deficits, functional use of  right upper extremity and right lower extremity, improved attention, and improved awareness.  Patient to discharge at an ambulatory level CGA/Min Assist.   Patient's care partner is independent to provide the necessary physical and cognitive assistance at discharge. Patient's wife, Dustin Blanchard, has been present nearly everyday and has had family education/training with therapies. [name] is able to direct patient well and understands his limitations and deficits.  Reasons goals not met: n/a  Recommendation:  Patient will benefit from ongoing skilled PT services in home health setting to continue to advance safe functional mobility, address ongoing impairments in dynamic standing balance, gait training with LRAD, fall prevention, caregiver training, home safety, and minimize fall risk.  Equipment: RW, transport wheelchair  Reasons for discharge: treatment goals met and discharge from hospital  Patient/family agrees with progress made and goals achieved: Yes  PT Discharge Precautions/Restrictions Precautions Precautions: Fall Precaution Comments: R inattention, expressive > receptive aphasia, impulsive, dementia Restrictions Weight Bearing Restrictions: No Pain Interference Pain Interference Pain Effect on Sleep: 8. Unable to answer Pain Interference with Therapy Activities: 8. Unable to answer Pain Interference with Day-to-Day Activities: 8. Unable to answer Vision/Perception  Vision - History Ability to See in Adequate Light: 1 Impaired Vision - Assessment Eye Alignment: Within Functional Limits Ocular Range of Motion: Restricted  on the right;Impaired-to be further tested in functional context Alignment/Gaze Preference: Within Defined Limits Tracking/Visual Pursuits: Decreased smoothness of horizontal tracking;Decreased smoothness of vertical tracking;Requires cues, head turns, or add eye shifts to track;Impaired - to be further tested in functional context;Right eye does not track laterally Saccades: Impaired - to be further tested in functional context Convergence: Impaired - to be further tested in functional context Perception Inattention/Neglect: Does not attend to right visual field;Impaired-to be further tested in functional context;Does not attend to right side of body Praxis Praxis: Impaired Praxis Impairment Details: Initiation;Motor planning;Ideomotor  Cognition Overall Cognitive Status: Impaired/Different from baseline Arousal/Alertness: Awake/alert Orientation Level: Oriented to person;Other (comment) (difficult to fully assess due to aphasia) Attention: Focused;Sustained;Selective Focused Attention: Appears intact Focused Attention Impairment: Verbal basic;Functional basic Sustained Attention: Impaired Sustained Attention Impairment: Verbal basic;Functional basic Selective Attention: Impaired Selective Attention Impairment: Verbal basic;Functional basic Awareness: Impaired Awareness Impairment: Emergent impairment Problem Solving: Impaired Problem Solving Impairment: Verbal basic;Functional basic Behaviors: Impulsive;Restless Safety/Judgment: Impaired Comments: Decreased insight into deficits Sensation Sensation Light Touch: Appears Intact Hot/Cold: Appears Intact Proprioception: Impaired by gross assessment Stereognosis: Not tested Coordination Gross Motor Movements are Fluid and Coordinated: No Coordination and Movement Description: R hemi (distally > proximally), severe R inattention Heel Shin Test: Able to complete via demonstration, slowed on R Motor  Motor Motor: Hemiplegia;Abnormal  postural alignment and control;Motor apraxia Motor - Discharge Observations: Significant improvement since date of evaluation. R hemi, apraxic, forward/flexed head  Mobility Bed Mobility Bed Mobility: Rolling Right;Rolling Left;Supine to Sit;Sit to Supine;Sitting - Scoot to [employer] of Bed Rolling Right: Supervision/verbal cueing Rolling Left: Supervision/Verbal cueing Supine to Sit: Supervision/Verbal cueing Sitting - Scoot to Edge of Bed: Supervision/Verbal cueing Sit to Supine: Supervision/Verbal cueing Transfers Transfers: Sit to Stand;Stand to Sit;Stand Pivot Transfers Sit to Stand: Supervision/Verbal cueing Stand to Sit: Supervision/Verbal cueing Stand Pivot Transfers: Contact Guard/Touching assist Transfer (  Assistive device): None Locomotion  Gait Ambulation: Yes Gait Assistance: Contact Guard/Touching assist;Supervision/Verbal cueing Gait Distance (Feet): 150 Feet Assistive device: Rolling walker Gait Assistance Details: Verbal cues for sequencing;Verbal cues for gait pattern;Verbal cues for precautions/safety;Visual cues/gestures for precautions/safety;Tactile cues for initiation Gait Gait: Yes Gait Pattern: Impaired Gait Pattern: Step-through pattern;Decreased stance time - right;Decreased hip/knee flexion - right;Decreased weight shift to right;Trunk flexed;Poor foot clearance - right Stairs / Additional Locomotion Stairs: Yes Stairs Assistance: Contact Guard/Touching assist Stair Management Technique: Two rails Number of Stairs: 12 Height of Stairs: 6 Wheelchair Mobility Wheelchair Mobility: No  Trunk/Postural Assessment  Cervical Assessment Cervical Assessment: Exceptions to [facility] Thoracic Assessment Thoracic Assessment: Exceptions to [facility] Lumbar Assessment Lumbar Assessment: Exceptions to [facility] Postural Control Postural Control: Deficits on evaluation  Balance Balance Balance Assessed: Yes Static Sitting Balance Static Sitting - Balance Support: Feet supported;No  upper extremity supported Static Sitting - Level of Assistance: 7: Independent Dynamic Sitting Balance Dynamic Sitting - Balance Support: Feet unsupported;During functional activity Dynamic Sitting - Level of Assistance: 5: Stand by assistance Static Standing Balance Static Standing - Balance Support: No upper extremity supported Static Standing - Level of Assistance: 5: Stand by assistance Dynamic Standing Balance Dynamic Standing - Balance Support: During functional activity;No upper extremity supported Dynamic Standing - Level of Assistance: 4: Min assist Dynamic Standing - Balance Activities: Reaching for objects;Reaching across midline;Lateral lean/weight shifting;Ball toss Extremity Assessment      RLE Assessment RLE Assessment: Exceptions to [facility] RLE Strength RLE Overall Strength: Deficits Right Hip Flexion: 3+/5 Right Hip ABduction: 4-/5 Right Hip ADduction: 4-/5 Right Knee Flexion: 4-/5 Right Knee Extension: 4-/5 Right Ankle Dorsiflexion: 4/5 LLE Assessment LLE Assessment: Exceptions to [facility] LLE Strength LLE Overall Strength: Deficits Left Hip Flexion: 4/5 Left Hip ABduction: 4+/5 Left Hip ADduction: 4/5 Left Knee Flexion: 4/5 Left Knee Extension: 4+/5 Left Ankle Dorsiflexion: 4+/5     Roma Bondar P Johnasia Liese PT, DPT 08/24/2022, 8:30 AM

## 2022-08-24 NOTE — Progress Notes (Signed)
Occupational Therapy Discharge Summary  Patient Details  Name: Dustin Blanchard MRN: [id number] Date of Birth: 1951-11-30  Date of Discharge from [place] service:August 24, 2022  Today's Date: 08/24/2022 OT Individual Time: 1033-1130 OT Individual Time Calculation (min): 57 min   Skilled Intervention:   Patient received up in wheelchair.  Wife present and offered opportunity to get questions asked and answered.  Patient and wife agreeable to shower.  Patient walked to bathroom with rolling walker and intermittent contact guard and cueing for visual attention.  Wife assisted patient as needed and provided cueing to wife to allow patient to identify when he encounters a problem and allow him time and space to safely resolve.  Wife very receptive and using environmental cueing versus all verbal cueing.  Patient able to don socks and shoes and even tie shoes after set up.  Patient left up in wheelchair with safety belt in place and engaged and wife at his side.  Call bell and personal items in reach.    Patient has met 12 of 12 long term goals due to improved activity tolerance, improved balance, postural control, ability to compensate for deficits, functional use of  RIGHT upper extremity, improved attention, and improved awareness.  Patient to discharge at [facility] Assist level.  Patient's care partner is independent to provide the necessary physical and cognitive assistance at discharge.    Reasons goals not met: NA  Recommendation:  Patient will benefit from ongoing skilled OT services in home health setting to continue to advance functional skills in the area of BADL and Reduce care partner burden.  Equipment: Bedside commode, tub transfer bench  Reasons for discharge: treatment goals met and discharge from hospital  Patient/family agrees with progress made and goals achieved: Yes  OT Discharge Precautions/Restrictions  Precautions Precautions: Fall Precaution Comments: R inattention,  expressive > receptive aphasia, impulsive, dementia Restrictions Weight Bearing Restrictions: No  Pain Pain Assessment Pain Score: 0-No pain ADL ADL Eating: Minimal assistance Where Assessed-Eating: Wheelchair Grooming: Supervision/safety Where Assessed-Grooming: Sitting at sink Upper Body Bathing: Supervision/safety Where Assessed-Upper Body Bathing: Shower Lower Body Bathing: Supervision/safety Where Assessed-Lower Body Bathing: Shower Upper Body Dressing: Setup Where Assessed-Upper Body Dressing: Wheelchair Lower Body Dressing: Supervision/safety Where Assessed-Lower Body Dressing: Wheelchair Toileting: Contact guard, Minimal cueing Where Assessed-Toileting: Glass blower/designer: Therapist, music Method: Counselling psychologist: Conservation officer, historic buildings Method: Educational psychologist: Facilities manager: Curator Method: Heritage manager: Shower seat with back ADL Comments: SN reports pt had been agitated in the night when NG tube being place and was given sedative meds and restraints placed to avoid pt pulling at new line. Pt with poor LOA and particiaption this session and day. OT worked with NT to position pt EOB to obtain increased stimulation and movmeent to elicit maximal response. Pt remained asleep, eyes closed except for minimal face washing, suction oral care and when bathing performed at buttocks and peri region- no skin breakdown noted.Poor visual regard and tracking despite sun filled room, music playing and max visual stimulai presented.  Significant forward head, kyphotic posture and R lean needing max support. TIS w/c with [place] cushion provided but unable to participate in OOB this sesison due to safety concerns. Vision Baseline Vision/History: 1 Wears glasses Patient Visual Report: Peripheral vision  impairment Vision Assessment?: Yes Eye Alignment: Impaired (comment) Ocular Range of Motion: Restricted on the right Alignment/Gaze Preference: Within Defined Limits Tracking/Visual  Pursuits: Decreased smoothness of horizontal tracking;Decreased smoothness of vertical tracking;Requires cues, head turns, or add eye shifts to track;Right eye does not track laterally;Decreased smoothness of eye movement to RIGHT inferior field;Decreased smoothness of eye movement to RIGHT superior field Saccades: Undershoots;Decreased speed of saccadic movement Convergence: Impaired - to be further tested in functional context Visual Fields: Right visual field deficit Additional Comments: Poor attention to right visual field, poor binocular control Perception  Perception: Impaired Inattention/Neglect: Does not attend to right side of body;Does not attend to right visual field Praxis Praxis: Impaired Praxis Impairment Details: Initiation;Motor planning Praxis-Other Comments: better motor planning in familiar functional activity Cognition Cognition Overall Cognitive Status: Impaired/Different from baseline Arousal/Alertness: Awake/alert Orientation Level: Nonverbal/unable to assess Memory: Impaired Memory Impairment: Other (comment) (difficult to assess with language deficit.  Able to recall familiar tasks - bathing and dressing - does well with backward chaining) Attention: Sustained;Selective Focused Attention: Appears intact Focused Attention Impairment: Functional basic Sustained Attention: Impaired Sustained Attention Impairment: Functional basic;Functional complex;Verbal basic Selective Attention: Impaired Selective Attention Impairment: Functional basic;Functional complex;Verbal basic Awareness Impairment: Intellectual impairment Problem Solving: Impaired Problem Solving Impairment: Functional complex;Verbal basic Behaviors: Restless Safety/Judgment: Impaired Comments: Moves frequently, some of  this is premorbid behavior - awake and up much of the night Brief Interview for Mental Status (BIMS) Repetition of Three Words (First Attempt): No answer Temporal Orientation: Year: No answer Temporal Orientation: Month: No answer Temporal Orientation: Day: No answer Recall: "Sock": No answer Recall: "Blue": No answer Recall: "Bed": No answer BIMS Summary Score: 99 Sensation Sensation Light Touch: Impaired by gross assessment Hot/Cold: Not tested Proprioception: Impaired by gross assessment Stereognosis: Not tested Additional Comments: Decreased engagement of RUE Coordination Gross Motor Movements are Fluid and Coordinated: No Fine Motor Movements are Fluid and Coordinated: No Coordination and Movement Description: R hemi (distally > proximally), severe R inattention 9 Hole Peg Test: Not tested Motor  Motor Motor: Hemiplegia;Motor apraxia;Motor impersistence Motor - Discharge Observations: Significant improvement since date of evaluation. R hemi, apraxic, forward/flexed head Mobility  Bed Mobility Bed Mobility: Rolling Right;Rolling Left;Supine to Sit;Sit to Supine;Sitting - Scoot to [employer] of Bed Rolling Right: Supervision/verbal cueing Rolling Left: Supervision/Verbal cueing Supine to Sit: Supervision/Verbal cueing Sitting - Scoot to Edge of Bed: Supervision/Verbal cueing Sit to Supine: Supervision/Verbal cueing Transfers Sit to Stand: Supervision/Verbal cueing Stand to Sit: Supervision/Verbal cueing  Trunk/Postural Assessment  Cervical Assessment Cervical Assessment: Exceptions to [facility] Cervical AROM Overall Cervical AROM Comments: Forward head posture Thoracic Assessment Thoracic Assessment: Exceptions to [facility] Thoracic AROM Overall Thoracic AROM Comments: Limited excursion for flexion and extension, lateral flex/ext/ rotation Lumbar Assessment Lumbar Assessment: Exceptions to [facility] Lumbar AROM Overall Lumbar AROM Comments: Limited pelvis mobility   Balance Balance Balance Assessed: Yes Static Sitting Balance Static Sitting - Balance Support: Feet supported;No upper extremity supported Static Sitting - Level of Assistance: 7: Independent Dynamic Sitting Balance Dynamic Sitting - Balance Support: Feet unsupported;During functional activity;No upper extremity supported Dynamic Sitting - Level of Assistance: 7: Independent Static Standing Balance Static Standing - Balance Support: Left upper extremity supported;Right upper extremity supported;During functional activity Static Standing - Level of Assistance: 5: Stand by assistance Static Standing - Comment/# of Minutes: 2+ min Dynamic Standing Balance Dynamic Standing - Balance Support: During functional activity;Left upper extremity supported;Right upper extremity supported Dynamic Standing - Level of Assistance: 4: Min assist Dynamic Standing - Balance Activities: Reaching for objects;Reaching across midline;Lateral lean/weight shifting;Ball toss Extremity/Trunk Assessment RUE Assessment RUE Assessment: Exceptions to [facility] Active Range of Motion (AROM) Comments:  Can flex shoulder overhead~ 120* General Strength Comments: difficulty sustaining flexed shoulder at 90* RUE Body System: Neuro Brunstrum levels for arm and hand: Arm;Hand Brunstrum level for arm: Stage V Relative Independence from Synergy Brunstrum level for hand: Stage V Independence from basic synergies RUE AROM (degrees) Overall AROM Right Upper Extremity: Deficits RUE Overall AROM Comments: Proximal weakness - hemiparesis LUE Assessment LUE Assessment: Within Functional Limits Active Range of Motion (AROM) Comments: Able to reach overhead t0 140* shoulder flexion General Strength Comments: NT   Mariah Milling 08/24/2022, 12:43 PM

## 2022-08-24 NOTE — Plan of Care (Signed)
  Problem: RH Swallowing Goal: LTG Patient will participate in dysphagia therapy to increase swallow function with assistance (SLP) Description: LTG:  Patient will participate in dysphagia therapy to increase swallow function with assistance (SLP) Outcome: Completed/Met Goal: LTG Pt will demonstrate functional change in swallow as evidenced by bedside/clinical objective assessment (SLP) Description: LTG: Patient will demonstrate functional change in swallow as evidenced by bedside/clinical objective assessment (SLP) Outcome: Completed/Met   Problem: RH Comprehension Communication Goal: LTG Patient will comprehend basic/complex auditory (SLP) Description: LTG: Patient will comprehend basic/complex auditory information with cues (SLP). Outcome: Completed/Met   Problem: RH Expression Communication Goal: LTG Patient will express needs/wants via multi-modal(SLP) Description: LTG:  Patient will express needs/wants via multi-modal communication (gestures/written, etc) with cues (SLP) Outcome: Completed/Met Goal: LTG Patient will verbally express basic/complex needs(SLP) Description: LTG:  Patient will verbally express basic/complex needs, wants or ideas with cues  (SLP) Outcome: Completed/Met   Problem: RH Attention Goal: LTG Patient will demonstrate this level of attention during functional activites (SLP) Description: LTG:  Patient will will demonstrate this level of attention during functional activites (SLP) Outcome: Completed/Met

## 2022-08-24 NOTE — Progress Notes (Signed)
Speech Language Pathology Discharge Summary  Patient Details  Name: Dustin Blanchard MRN: [id number] Date of Birth: 08-14-1951  Date of Discharge from SLP service:August 24, 2022  Today's Date: 08/24/2022 SLP Individual Time: 0920-1005 SLP Individual Time Calculation (min): 45 min   Skilled Therapeutic Interventions:   S: Pt seen this date for skilled ST intervention targeting deglutition and communication goals outlined above. Pt received awake/alert and using the bathroom with wife's assistance; cleared by OT. [name] reports constipation. Agreeable to ST intervention in hospital room via head nod.  O: SLP facilitated today's session by providing Min-Mod A verbal cues for implementation of safe swallowing strategies (slow rate and small sips) with self-administration of nectar-thick liquid via straw. Accepted only one bite of puree. Only one instance of wet vocal quality noted, which pt cleared reflexively with throat clear; no additional clinical s/sx concerning for airway invasion noted (consumed 20 oz of fluid). Per chart review, vital signs remain stable. Provided additional education to pt's wife re: how to puree foods at home, how to purchase pre-made/packaged pureed foods, how to thicken liquids to a nectar-thick consistency, ways to engage pt in language tasks at home, and IDDSI 2 and IDDSI 4 testing methods. [name] verbalized understanding of all information and appeared appreciative. Handouts provided. This date, pt achieved ~90% accuracy with basic, biographical and environmental yes/no questions; ~50% for basic fact questions (e.g. "are you a doctor"). Following one-step commands with overall Min A verbal cues. 0% accuracy during confrontational naming task; imitated single words on ~20% of opportunities. Sung a Warehouse manager with Mod-Max A.  A: Plan to d/c from [facility] services this date to prepare for upcoming hospital discharge. Recommend f/u [facility] post discharge. Wife aware and in  agreement. CSW states she was able to contact [facility] agency that can help provide allied health services post-d/c.  P: Pt left in w/c with all safety measures activated and pt's wife present. All immediate needs met. Continue per current ST POC next session.  Patient has met 6 of 6 long term goals.  Patient to discharge at overall Mod;Max level.  Reasons goals not met: N/A   Clinical Impression/Discharge Summary:    Pt has demonstrated functional gains during CIR admission as evident by meeting 6 out of 6 long-term goals. Family education completed. Given severity of pt's deficits, recommend HH ST to address ongoing swallowing and communication + language deficits. Recommend continuation of Dysphagia 1 textures and nectar-thick liquid via cup or straw given Min A verbal cues for adherence to aspiration precautions particularly slow rate. [name] in agreement with recommendations, and will be available and ready to provide 24/7 supervision and assistance with help from family.   Care Partner:  Caregiver Able to Provide Assistance: Yes  Type of Caregiver Assistance: Physical;Cognitive  Recommendation:  Home Health SLP;24 hour supervision/assistance  Rationale for SLP Follow Up: Maximize functional communication;Maximize swallowing safety;Reduce caregiver burden   Equipment: N/A   Reasons for discharge: Discharged from hospital   Patient/Family Agrees with Progress Made and Goals Achieved: Yes    Dustin Blanchard 08/24/2022, 1:51 PM

## 2022-08-24 NOTE — Progress Notes (Signed)
PROGRESS NOTE   Subjective/Complaints:  Pt sitting in WC. His wife is also in his room. He appears calm this AM. No concerns elicited.  Plan for DC home tomorrow.   ROS: limited due to cognition and aphasia  Objective:   No results found.  No results for input(s): "WBC", "HGB", "HCT", "PLT" in the last 72 hours.  Recent Labs    08/22/22 0934  NA 144  K 3.6  CL 109  CO2 29  GLUCOSE 113*  BUN 9  CREATININE 0.94  CALCIUM 9.2      Intake/Output Summary (Last 24 hours) at 08/24/2022 1645 Last data filed at 08/24/2022 1000 Gross per 24 hour  Intake 480 ml  Output --  Net 480 ml          Physical Exam: Vital Signs Blood pressure 113/66, pulse (!) 53, temperature 97.6 F (36.4 C), temperature source Oral, resp. rate 17, height 6' (1.829 m), weight 62.9 kg, SpO2 100 %.     General: awake, alert, calm; wife at bedside; in bedside w/c; NAD HENT: conjugate gaze; oropharynx moist CV: bradycardic  rate; no JVD Pulmonary: CTA B/L; no W/R/R- good air movement GI: soft, NT, ND, (+)BS Psychiatric: calm- smiling with wife here Skin: warm, dry and intact Neurological:     Comments: Patient is alert.  nods yes and no, aphasia , Right facial droop. Globally aphasic.  He would follow some simple motor commands with significant prompting.  Severe right sided inattention Mod A for lower body dressing   Assessment/Plan: 1. Functional deficits which require 3+ hours per day of interdisciplinary therapy in a comprehensive inpatient rehab setting. Physiatrist is providing close team supervision and 24 hour management of active medical problems listed below. Physiatrist and rehab team continue to assess barriers to discharge/monitor patient progress toward functional and medical goals  Care Tool:  Bathing    Body parts bathed by patient: Right arm, Left arm, Chest, Abdomen, Front perineal area, Right upper leg, Left upper  leg, Right lower leg, Left lower leg, Face   Body parts bathed by helper: Buttocks     Bathing assist Assist Level: Supervision/Verbal cueing     Upper Body Dressing/Undressing Upper body dressing   What is the patient wearing?: Pull over shirt    Upper body assist Assist Level: Set up assist    Lower Body Dressing/Undressing Lower body dressing      What is the patient wearing?: Incontinence brief, Pants     Lower body assist Assist for lower body dressing: Minimal Assistance - Patient > 75%     Toileting Toileting    Toileting assist Assist for toileting: Contact Guard/Touching assist     Transfers Chair/bed transfer  Transfers assist  Chair/bed transfer activity did not occur: Safety/medical concerns  Chair/bed transfer assist level: Contact Guard/Touching assist     Locomotion Ambulation   Ambulation assist   Ambulation activity did not occur: Safety/medical concerns  Assist level: Contact Guard/Touching assist Assistive device: Walker-rolling Max distance: 185ft   Walk 10 feet activity   Assist  Walk 10 feet activity did not occur: Safety/medical concerns  Assist level: Contact Guard/Touching assist Assistive device: Walker-rolling   Walk  50 feet activity   Assist Walk 50 feet with 2 turns activity did not occur: Safety/medical concerns  Assist level: Contact Guard/Touching assist Assistive device: Walker-rolling    Walk 150 feet activity   Assist Walk 150 feet activity did not occur: Safety/medical concerns  Assist level: Contact Guard/Touching assist Assistive device: Walker-rolling    Walk 10 feet on uneven surface  activity   Assist Walk 10 feet on uneven surfaces activity did not occur: Safety/medical concerns   Assist level: Contact Guard/Touching assist Assistive device: Walker-rolling   Wheelchair     Assist Is the patient using a wheelchair?: No Type of Wheelchair: Manual Wheelchair activity did not occur:  Safety/medical concerns         Wheelchair 50 feet with 2 turns activity    Assist    Wheelchair 50 feet with 2 turns activity did not occur: Safety/medical concerns       Wheelchair 150 feet activity     Assist  Wheelchair 150 feet activity did not occur: Safety/medical concerns       Blood pressure 113/66, pulse (!) 53, temperature 97.6 F (36.4 C), temperature source Oral, resp. rate 17, height 6' (1.829 m), weight 62.9 kg, SpO2 100 %.  Medical Problem List and Plan: 1. 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  Atrial fibrillation with RVR: Eliquis initiated 08/10/2022. Continue amiodarone 200mg  daily             -antiplatelet therapy: N/A 3. Pain: n/a 4. 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 Add melatonin 3mg  HS             -antipsychotic agents: Seroquel 25 mg daily with supper and 12.5 mg 3 times daily as needed. 5. Neuropsych/cognition: This patient is not capable of making decisions on his own behalf. 6. Skin/Wound Care: Routine skin checks 7. Fluids/Electrolytes/Nutrition: Routine in and outs with follow-up chemistries  9/2- stop IVFs- BUN 9 and Cr 0.94- so doing great- don't think needs IVFs anymore 8.  New onset atrial fibrillation RVR/CHF.  Follow-up cardiology service.  Amiodarone as directed, Aldactone 12.5 mg daily. Add magnesium supplement 250mg  gluconate HS. Cardiac rate controlled.  Plan outpatient ischemic work-up after recovery from CVA 9.   Dysphagia.  NPO.  Alternative means of nutritional support.  Follow-up speech therapy. Discussed eval with SLP today before replacing NGT  -On dys1 nectar thick diet 10.  Hyperlipidemia.  Continue Lipitor 11. Suboptimal vitamin D level: start 800U daily 12. Hyperglycemia: likely secondary to tube feeds, hemoglobin A1c reviewed and is 4.6, continue to monitor. Does not need CBG checks.  13. Right lower lobe pneumonia: continue Ancef -completed coarse ancef 14. Worsening lethargy: CT head ordered and stable, improved, likely secondary to pneumonia 15. Bradycardia: will reach out to cardiology to see if Amiodarone can be decreased  -Spoke with  cardiology 8/26, will decrease amiodarone to 200daily, appreciate assistance   8/27 Continues to have episodic bradycardia, Cardiology advises as long as HR increased with activity appropriately and no sx HR in 50s, no further action needed 16. UTI: on Ancef, f/u UC- 7000 CFU e cloacae resistant to cefazolin  -Foley draining clear yellow urine, only 7000CFU-likely more contaminant- continue to monitor  -8/27 leukocytosis resolved on CBC today  -Recheck tomorrow 18. Chronic BPH: flomax restarted HS, continue 19. Depression: replace Prozac with Remeron to help stimulate appetite, discussed with wife.  20. Decreased appetite: continue Remeron 7.5mg  HS, discussed with wife.  21. Agitation, unsafe- getting OOB  9/2- will order enclosure bed- when wife isn't here, pt gets restless/agitated, and needs to be kept safe  9/3- decided on waist restraints ot keep in bed- not enclosure bed-    I spent a total of 36   minutes on total care today- >50% coordination of care- due to d/w wife about care and   LOS: 12 days A FACE TO FACE EVALUATION WAS PERFORMED  Jennye Boroughs 08/24/2022, 4:45 PM

## 2022-08-25 LAB — CBC
HCT: 33.5 % — ABNORMAL LOW (ref 39.0–52.0)
Hemoglobin: 11.3 g/dL — ABNORMAL LOW (ref 13.0–17.0)
MCH: 32.5 pg (ref 26.0–34.0)
MCHC: 33.7 g/dL (ref 30.0–36.0)
MCV: 96.3 fL (ref 80.0–100.0)
Platelets: 288 10*3/uL (ref 150–400)
RBC: 3.48 MIL/uL — ABNORMAL LOW (ref 4.22–5.81)
RDW: 12 % (ref 11.5–15.5)
WBC: 6.9 10*3/uL (ref 4.0–10.5)
nRBC: 0 % (ref 0.0–0.2)

## 2022-08-25 MED ORDER — CHOLECALCIFEROL 10 MCG (400 UNIT) PO TABS: 800.0000 [IU] | ORAL_TABLET | Freq: Every day | ORAL | 0 refills | Status: DC

## 2022-08-25 MED ORDER — PANTOPRAZOLE SODIUM 40 MG PO PACK: 40.0000 mg | PACK | Freq: Every day | ORAL | 0 refills | Status: DC

## 2022-08-25 MED ORDER — ATORVASTATIN CALCIUM 40 MG PO TABS: 40.0000 mg | ORAL_TABLET | Freq: Every day | ORAL | 0 refills | Status: DC

## 2022-08-25 MED ORDER — APIXABAN 5 MG PO TABS: 5.0000 mg | ORAL_TABLET | Freq: Two times a day (BID) | ORAL | 0 refills | Status: DC

## 2022-08-25 MED ORDER — POLYETHYLENE GLYCOL 3350 17 G PO PACK: 17.0000 g | PACK | Freq: Every day | ORAL | 0 refills | Status: AC

## 2022-08-25 MED ORDER — TAMSULOSIN HCL 0.4 MG PO CAPS: 0.4000 mg | ORAL_CAPSULE | Freq: Every day | ORAL | 0 refills | Status: AC

## 2022-08-25 MED ORDER — B COMPLEX-C PO TABS: 1.0000 | ORAL_TABLET | Freq: Every day | ORAL | 0 refills | Status: DC

## 2022-08-25 MED ORDER — MELATONIN 3 MG PO TABS: 3.0000 mg | ORAL_TABLET | Freq: Every day | ORAL | 0 refills | Status: AC

## 2022-08-25 MED ORDER — SPIRONOLACTONE 25 MG PO TABS: 25.0000 mg | ORAL_TABLET | Freq: Every day | ORAL | 0 refills | Status: DC

## 2022-08-25 MED ORDER — ACETAMINOPHEN 325 MG PO TABS: 650.0000 mg | ORAL_TABLET | ORAL | Status: DC | PRN

## 2022-08-25 MED ORDER — ADULT MULTIVITAMIN W/MINERALS CH: 1.0000 | ORAL_TABLET | Freq: Every day | ORAL | Status: DC

## 2022-08-25 MED ORDER — MIRTAZAPINE 7.5 MG PO TABS: 7.5000 mg | ORAL_TABLET | Freq: Every day | ORAL | 0 refills | Status: DC

## 2022-08-25 MED ORDER — AMIODARONE HCL 200 MG PO TABS: 200.0000 mg | ORAL_TABLET | Freq: Every day | ORAL | 0 refills | Status: DC

## 2022-08-25 MED ORDER — MAGNESIUM GLUCONATE 500 MG PO TABS: 250.0000 mg | ORAL_TABLET | Freq: Every day | ORAL | 0 refills | Status: DC

## 2022-08-25 MED ORDER — QUETIAPINE FUMARATE 25 MG PO TABS: 25.0000 mg | ORAL_TABLET | Freq: Every day | ORAL | 0 refills | Status: DC

## 2022-08-25 MED ORDER — VALPROIC ACID 250 MG/5ML PO SOLN: 250.0000 mg | Freq: Every day | ORAL | 0 refills | Status: DC

## 2022-08-25 NOTE — Progress Notes (Signed)
Pt finally agreeable to receive soap suds enema after multiple attempts to have BM and no results. Pt was unable to retain all of liquid but SSE still effective. Pt had medium soft bowel movement and pt reports feeling better.  Mylo Red, LPN

## 2022-08-25 NOTE — Progress Notes (Signed)
Inpatient Rehabilitation Discharge Medication Review by a Pharmacist  A complete drug regimen review was completed for this patient to identify any potential clinically significant medication issues.  High Risk Drug Classes Is patient taking? Indication by Medication  Antipsychotic Yes Seroquel-agitation  Anticoagulant Yes Eliquis- afib  Antibiotic No   Opioid No   Antiplatelet No   Hypoglycemics/insulin No   Vasoactive Medication Yes Amiodarone/Aldactone- heart failure  Chemotherapy No   Other Yes Atorvastatin- HLD Remeron- depression/appetite stimulant Protonix- GERD Flomax- BPH Depakote- mood stabilizer     Type of Medication Issue Identified Description of Issue Recommendation(s)  Drug Interaction(s) (clinically significant)     Duplicate Therapy     Allergy     No Medication Administration End Date     Incorrect Dose     Additional Drug Therapy Needed     Significant med changes from prior encounter (inform family/care partners about these prior to discharge).    Other       Clinically significant medication issues were identified that warrant physician communication and completion of prescribed/recommended actions by midnight of the next day:  No  Name of provider notified for urgent issues identified:   Provider Method of Notification:    Pharmacist comments:   Time spent performing this drug regimen review (minutes):  30  Noah Delaine, [place] Clinical Pharmacist 08/25/2022 9:06 AM

## 2022-08-25 NOTE — Progress Notes (Signed)
Patient discharged off unit with all belongings. Discharge instructions explained to family by PA. No further questions at time of discharge, no complications noted.

## 2022-08-26 NOTE — Progress Notes (Signed)
Patient ID: Dustin Blanchard, male   DOB: 01-10-1951, 71 y.o.   MRN: [id number]  Wife called worker and reports the home health agency-[facility] does not have a speech therapist. Have called other home health agencies and none of them have speech. Only other option is to go to OP and have made referral to [facility] on [address]. Wife is agreeable to this and will transport him here for all three therapies.  Wife will call worker back if does not receive a call from Op in the two days.

## 2022-09-07 ENCOUNTER — Encounter: Payer: Self-pay | Admitting: Occupational Therapy

## 2022-09-07 ENCOUNTER — Ambulatory Visit: Payer: Medicare HMO | Admitting: Physical Therapy

## 2022-09-07 ENCOUNTER — Ambulatory Visit: Payer: Medicare HMO | Attending: Student | Admitting: Occupational Therapy

## 2022-09-07 ENCOUNTER — Ambulatory Visit: Payer: Medicare HMO | Admitting: Speech Pathology

## 2022-09-07 DIAGNOSIS — R471 Dysarthria and anarthria: Secondary | ICD-10-CM

## 2022-09-07 DIAGNOSIS — R41844 Frontal lobe and executive function deficit: Secondary | ICD-10-CM | POA: Diagnosis present

## 2022-09-07 DIAGNOSIS — R41841 Cognitive communication deficit: Secondary | ICD-10-CM | POA: Insufficient documentation

## 2022-09-07 DIAGNOSIS — I69351 Hemiplegia and hemiparesis following cerebral infarction affecting right dominant side: Secondary | ICD-10-CM | POA: Diagnosis present

## 2022-09-07 DIAGNOSIS — R4701 Aphasia: Secondary | ICD-10-CM | POA: Diagnosis present

## 2022-09-07 DIAGNOSIS — R1312 Dysphagia, oropharyngeal phase: Secondary | ICD-10-CM | POA: Diagnosis present

## 2022-09-07 DIAGNOSIS — R41840 Attention and concentration deficit: Secondary | ICD-10-CM

## 2022-09-07 DIAGNOSIS — R278 Other lack of coordination: Secondary | ICD-10-CM

## 2022-09-07 DIAGNOSIS — R2689 Other abnormalities of gait and mobility: Secondary | ICD-10-CM | POA: Diagnosis present

## 2022-09-07 DIAGNOSIS — R2681 Unsteadiness on feet: Secondary | ICD-10-CM

## 2022-09-07 DIAGNOSIS — R41842 Visuospatial deficit: Secondary | ICD-10-CM

## 2022-09-07 DIAGNOSIS — R482 Apraxia: Secondary | ICD-10-CM

## 2022-09-07 DIAGNOSIS — I69318 Other symptoms and signs involving cognitive functions following cerebral infarction: Secondary | ICD-10-CM

## 2022-09-07 NOTE — Therapy (Signed)
OUTPATIENT SPEECH LANGUAGE PATHOLOGY EVALUATION   Patient Name: Dustin Blanchard MRN: [id number] DOB:12-Dec-1951, 71 y.o., male Today's Date: 09/07/2022  PCP: [facility] [place] PROVIDER: Ortencia Kick   End of Session - 09/07/22 1327     Visit Number 1    Number of Visits 25    Date for SLP Re-Evaluation 11/30/22    Authorization Type [employer]    Progress Note Due on Visit 10    SLP Start Time [phone]    SLP Stop Time  1017    SLP Time Calculation (min) 49 min    Activity Tolerance Patient tolerated treatment well             Past Medical History:  Diagnosis Date   Dementia [facility])    Past Surgical History:  Procedure Laterality Date   HERNIA REPAIR     HIP SURGERY     IR CT HEAD LTD  08/06/2022   IR CT HEAD LTD  08/06/2022   IR PERCUTANEOUS ART THROMBECTOMY/INFUSION INTRACRANIAL INC DIAG ANGIO  08/06/2022   IR US GUIDE VASC ACCESS RIGHT  08/06/2022   RADIOLOGY WITH ANESTHESIA N/A 08/06/2022   Procedure: IR WITH ANESTHESIA;  Surgeon: Luanne Bras, MD;  Location: [place];  Service: Radiology;  Laterality: N/A;   Patient Active Problem List   Diagnosis Date Noted   Pneumonia of right lower lobe due to infectious organism    Left middle cerebral artery stroke ([place]) 08/12/2022   Sinus pause    Acute combined systolic and diastolic heart failure (HCC)    Atrial fibrillation with RVR (HCC)    Acute on chronic combined systolic and diastolic CHF (congestive heart failure) ([place])    Acute ischemic left MCA stroke ([place]) 08/06/2022    ONSET DATE: 08/06/2022   REFERRING DIAG: I63.9 (ICD-10-CM) - CVA (cerebral vascular accident) ([place])  THERAPY DIAG:  Cognitive communication deficit - Plan: SLP plan of care cert/re-cert  Oropharyngeal dysphagia - Plan: SLP plan of care cert/re-cert  Dysarthria and anarthria - Plan: SLP plan of care cert/re-cert  Aphasia - Plan: SLP plan of care cert/re-cert  Rationale for Evaluation and Treatment  Rehabilitation  SUBJECTIVE:   SUBJECTIVE STATEMENT: "He gets a little frustrated when he can't tell you what he wants to say"  Pt accompanied by: significant other  PERTINENT HISTORY: dc vascular dementia 11/2021; present to ED 08/06/2022 w/ acute onset of right sided weakness and aphasia.  Cranial CT scan showed small age-indeterminate left PCA infarct at the left occipital lobe.,  CT cervical spine negative.  CT angiogram head and neck positive for left MCA territory oligemia, approximately 120 mL.  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 Independent prior to admission.   PAIN:  Are you having pain? No   FALLS: Has patient fallen in last 6 months?  No  LIVING ENVIRONMENT: Lives with: lives with their spouse Lives in: House/apartment  PLOF:  Level of assistance: Independent with IADLs Employment: Retired   PATIENT GOALS "be able to communicate"   OBJECTIVE:   DIAGNOSTIC FINDINGS:  COGNITION: Overall cognitive status: Difficulty to assess due to: Communication impairment Areas of impairment:  Attention: Impaired: Sustained, Alternating, Divided Memory: Impaired: Working Industrial/product designer term Cytogeneticist function: Impaired: Problem solving, Organization, Planning, Error awareness, and Slow processing Functional deficits: PMHx notable for vascular dementia dx Dec 2022; wife reports dementia presented as primarily memory deficits. Attention, memory, planning, and processing deficits demonstrated this date during  language evaluation but full extend difficult to ascertain 2/2 communication impairment. At this time, fully reliant on spouse as caregiver for participation in home based activities.   AUDITORY COMPREHENSION: Overall auditory comprehension: Impaired: moderately complex YES/NO questions: Impaired: moderately complex Following directions: Impaired: moderately complex Conversation:  Simple Interfering components: attention, processing speed, and working memory Effective technique: extra processing time, pausing, repetition/stressing words, slowed speech, stressing words, and visual/gestural cues  READING COMPREHENSION: Impaired: phrase  EXPRESSION: verbal  VERBAL EXPRESSION: Level of generative/spontaneous verbalization: word and phrase Automatic speech: counting: 1-10 with max-A and day of week: verbalizes M-Th, then stops   Repetition: Impaired: phrase Naming: Confrontation: 26-50% and Divergent: 0-25% Pragmatics: not tested Interfering components: attention and speech intelligibility Effective technique: semantic cues, sentence completion, phonemic cues, written cues, and articulatory cues trialed this date with limited success demonstrated during confrontation naming task  Non-verbal means of communication: N/A Comments: Attempts to answer questions, is able to answer Y/N, for open ended questions, uses fillers and ultimately aborts response attempt. During QAB administration and confrontation naming task with F:4, demonstrates unstable lexical system representation  through use of mixed paraphasias.   WRITTEN EXPRESSION: Dominant hand: right  Written expression: Impaired: word Comments: SLP had pt write his name, begins with "dougl" then exhibiting errors, writing letters not needed. With dictation, unable to correct.   MOTOR SPEECH: Overall motor speech: impaired Level of impairment: Word Respiration: thoracic breathing Phonation: hoarse and low vocal intensity Resonance: WFL Articulation: Impaired: word Intelligibility: Intelligibility reduced Motor planning: Appears intact Interfering components:  aphasia, working memory, reduced awareness, difficulty following directions Effective technique: increased vocal intensity  ORAL MOTOR EXAMINATION Overall status: Impaired:   Lingual: Right (ROM, Symmetry, and Strength) Comments: overall unremarkable;  unable to elicit volitional cough on demand or swallow without PO boluses. Exam limited 2/2 pt difficulty following directions.  RECOMMENDATIONS FROM OBJECTIVE SWALLOW STUDY (MBSS/FEES):  08/18/2022  Objective swallow impairments: Per instrumental findings, pt presents with mild-moderate oropharyngeal dysphagia in the setting of deconditioning, hx of vascular dementia, and recent CVA. Oral phase remarkable for lingual weakness, decreased bolus manipulation + cohesion, delayed oral transit, and premature spillage in the oropharynx prior to swallow initiation. Pharyngeal phase remarkable for delayed swallow initiation, reduced base of tongue approximation to the posterior pharyngeal wall,  decreased pharyngeal drive/peristalsis, diminished hyolaryngeal excursion + epiglottic inversion, and decreased airway closure.  Objective recommended compensations: nectar thickened liquids with puree solids   CLINICAL SWALLOW ASSESSMENT:   Current diet: Dysphagia 1 (puree) and nectar thick liquids Dentition: adequate natural dentition Patient directly observed with POs: Yes: thin liquids  Feeding: able to feed self Liquids provided by: cup Oral phase signs and symptoms: oral holding Pharyngeal phase signs and symptoms: suspected delayed swallow initiation, audible swallow, wet vocal quality, and immediate throat clear Comments: Wife is fixing normal foods and puree, pt is eating. Did have a banana w/o difficulty. Continues to take medication with applesauce.    STANDARDIZED ASSESSMENTS: QAB: Severe Semantic and phonemic foils; reduced auditory comprehension; requires extended processing time; impaired naming; connected speech notable for reduced MLU (<2), usual anomia, + empty speech and fillers   PATIENT REPORTED OUTCOME MEASURES (PROM): Deferred d/t time constraints   TODAY'S TREATMENT:  SLP advised pt and spouse of observations and recommendations for implementation of ST services. Collaborated to  generate goals and form plan of care. Pt and spouse are in agreement with proposed POC, deny any questions at conclusion of session.   PATIENT EDUCATION: Education details: see above Person  educated: Patient and Spouse Education method: Explanation, Demonstration, and Handouts Education comprehension: verbalized understanding, returned demonstration, and needs further education  GOALS: Goals reviewed with patient? Yes  SHORT TERM GOALS: Target date: 10/12/2022  Pt will average 80% accuracy in practical naming tasks with usual mod-A over 2 sessions.  Baseline: Goal status: INITIAL  2.  Spouse will teach back supportive communication strategies to aid in auditory comprehension at home with supervision A.   Baseline:  Goal status: INITIAL  3.  Pt will comprehend  written choice f:3 at word level with known context 80% to make accurate selections of preferences, answer questions at home with occasional min A Baseline:  Goal status: INITIAL  4.  Pt will be intelligible in 2 word phrase production in 15/20 sentences with direct model and usual mod-A  Baseline:  Goal status: INITIAL  5.  Pt will complete augmentative communication/SGD canidacy assessment to determine appropriate communication device to support communication of wants/needs/thoughts Baseline:  Goal status: INITIAL   LONG TERM GOALS: Target date: 11/30/2022   Pt will use AAC/multimodal communicate to communicate choices, wants at home with family and caregiver over 1 week Baseline:  Goal status: INITIAL  2.  Pt will name 5 items in personally relevant categories with occasional mod A over 2 sessions Baseline:  Goal status: INITIAL  3.  Pt will employ dysarthria strategies resulting in 50% intelligibility in self-generated words and phrases with usual mod-A Baseline:  Goal status: INITIAL  4.  Pt will participate in clinical swallow evaluation to assess for candidacy for upgraded diet textures and liquid  consistencies PRN Baseline:  Goal status: INITIAL   ASSESSMENT:  CLINICAL IMPRESSION: Patient is a 71 y.o. M who was seen today for cognitive linguistic and dysphagia evaluation following CVA. Pt presenting with severe non-fluent aphasia, moderate dysarthria, oropharyngeal dysphagia, and cognitive deficits in areas of attention, memory, executive functioning.  Significant expressive > receptive language deficits impacting overall communication abilities. Pt spouse reporting usual frustration at home, though are employing yes/no questioning to determine wants/needs. Pt evidences frustration with communication limitations this date: crying but insists on persisting through evaluation. Notable difficulties in communicating beyond answer y/n questions, unable to provide substantive response to any open ended question or choose from written or spoken FO2 or FO4. 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 Dysarthria characterized by reduced vocal intensity and imprecise articulation which results in intelligibility reduced to ~50% at single word level. Per MBSS, pt with oropharyngeal dysphagia, currently on nectar thickened liquids with purees at home. Dysphagia exercises or strategies are contraindicated at this time d/t difficulty evidenced with following directions and impaired receptive language and memory. Plan to monitor pt's diet tolerance, complete subsequent clinical swallow evaluation, and adjust dysphagia intervention or recommendations based on pt progress. Decreased working memory and attention evidenced throughout evaluation, requiring SLP to redirect pt with verbal cues. Severity of cognitive impairments difficult to assess 2/2 communication impairment. Pt benefiting from usual repetition, use of simple and direct language, visual and verbal cueing for participation in assessment. SLP recommends skilled ST intervention course to facilitate improved communication efficacy, reduce  caregiver burden, and improve QoL.   OBJECTIVE IMPAIRMENTS include attention, memory, executive functioning, aphasia, dysarthria, and dysphagia. These impairments are limiting patient from ADLs/IADLs, effectively communicating at home and in community, and safety when swallowing. Factors affecting potential to achieve goals and functional outcome are ability to learn/carryover information, co-morbidities, previous level of function, and financial resources. Patient will benefit from skilled SLP services  to address above impairments and improve overall function.  REHAB POTENTIAL: Fair co-morbidities  PLAN: SLP FREQUENCY: 2x/week  SLP DURATION: 12 weeks  PLANNED INTERVENTIONS: Aspiration precaution training, Pharyngeal strengthening exercises, Diet toleration management , Language facilitation, Trials of upgraded texture/liquids, Cueing hierachy, Cognitive reorganization, Internal/external aids, Oral motor exercises, Functional tasks, Multimodal communication approach, SLP instruction and feedback, Compensatory strategies, Patient/family education, and Re-evaluation    Su Monks, CCC-SLP 09/07/2022, 1:28 PM

## 2022-09-07 NOTE — Therapy (Signed)
OUTPATIENT OCCUPATIONAL THERAPY NEURO EVALUATION  Patient Name: Dustin Blanchard MRN: [id number] DOB:03-10-1951, 71 y.o., male Today's Date: 09/07/2022  PCP: [place] PROVIDER: Dr. Leeroy Cha   OT End of Session - 09/07/22 1258     Visit Number 1    Number of Visits 25    Date for OT Re-Evaluation 12/06/22    Authorization Type [employer] insurance verifications    Authorization - Visit Number 1    Authorization - Number of Visits 10    Progress Note Due on Visit 10    OT Start Time 1019    OT Stop Time 1100    OT Time Calculation (min) 41 min    Activity Tolerance Patient tolerated treatment well    Behavior During Therapy [facility] for tasks assessed/performed;Flat affect             Past Medical History:  Diagnosis Date   Dementia [facility])    Past Surgical History:  Procedure Laterality Date   HERNIA REPAIR     HIP SURGERY     IR CT HEAD LTD  08/06/2022   IR CT HEAD LTD  08/06/2022   IR PERCUTANEOUS ART THROMBECTOMY/INFUSION INTRACRANIAL INC DIAG ANGIO  08/06/2022   IR US GUIDE VASC ACCESS RIGHT  08/06/2022   RADIOLOGY WITH ANESTHESIA N/A 08/06/2022   Procedure: IR WITH ANESTHESIA;  Surgeon: Luanne Bras, MD;  Location: [place];  Service: Radiology;  Laterality: N/A;   Patient Active Problem List   Diagnosis Date Noted   Pneumonia of right lower lobe due to infectious organism    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   Sinus pause    Acute combined systolic and diastolic heart failure (HCC)    Atrial fibrillation with RVR (HCC)    Acute on chronic combined systolic and diastolic CHF (congestive heart failure) ([place])    Acute ischemic left MCA stroke ([place]) 08/06/2022    ONSET DATE: 08/06/22  REFERRING DIAG: I63.9 (ICD-10-CM) - CVA (cerebral vascular accident) ([place])   THERAPY DIAG:  Other symptoms and signs involving cognitive functions following cerebral infarction  Attention and concentration  deficit  Frontal lobe and executive function deficit  Visuospatial deficit  Hemiplegia and hemiparesis following cerebral infarction affecting right dominant side (HCC)  Other lack of coordination  Unsteadiness on feet  Apraxia  Rationale for Evaluation and Treatment Rehabilitation  SUBJECTIVE:   SUBJECTIVE STATEMENT: Pt did not vocalize responses during eval despite attempts, but he did nod/shake his head yes/no (however, not always accurate per wife).  Pt accompanied by: significant other    PERTINENT HISTORY: Presented to hospital 08/06/2022 with acute onset of right-sided weakness and aphasia. Cranial CT scan showed small age-indeterminate left PCA infarct of the left occipital lobe.  Patient did receive TNK as well as endovascular revascularization of occlusion of left M3 MCA middle division branch 08/06/2022 per interventional radiology. In hospital, cardiology service consulted for new A-fib RVR/CHF.    PMH:  hypertension hyperlipidemia sinus bradycardia/chronic CHF with mild regurgitation BPH history of tobacco use vascular dementia diagnosed 2022  PRECAUTIONS: Fall and Other: aphasia, Dysphagia #1 nectar thick liquids   WEIGHT BEARING RESTRICTIONS No  PAIN:  Are you having pain? No  FALLS: Has patient fallen in last 6 months? Yes. Number of falls morning of CVA  LIVING ENVIRONMENT: Lives with: lives with their spouse Lives in: House/apartment Stairs: Yes: External: 4 steps; bilateral Has following equipment at home: Gilford Rile - 2 wheeled, Shower bench, bed side commode, and transport  w/c, handheld shower head  PLOF: Independent, Vocation/Vocational requirements: machine shop, small-scale farming, and Leisure: gardening, repairs around the house   PATIENT GOALS be more independent, use RUE more, attend to R side more.  OBJECTIVE:   HAND DOMINANCE: Right  ADLs: Overall ADLs: supervision for safety.  Pt able to follow directions to sanitize hands with min-mod  cueing for use of pump/dispenser.   Transfers/ambulation related to ADLs: Eating: supervision/mod I with pureed food Grooming: supervision/mod I brushing teeth, son shaving/trimming  UB Dressing: supervision/mod I for overhead shirt, but shirt was backwards yesterday, able to button, able to put on jacket, but unable to zip jacket during eval LB Dressing: mod I Toileting: supervision Bathing: wife holds shower head and pt washes Tub Shower transfers: supervision with tub bench due to vision and safety Equipment: Transfer tub bench  IADLs: Shopping: used transport chair and family propelled x1 Light housekeeping: wife typically performed, pt did yardwork prior/repairs--currently unable Meal Prep: wife has always performed  Community mobility: independent prior, unable currently Medication management: wife performing currently Financial management: wife was performing prior Handwriting: Not legible and unable  MOBILITY STATUS:  supervision due to dragging R foot, uses RW in home, transport w/c in community  FUNCTIONAL OUTCOME MEASURES: FOTO: not set-up/completed due to cognitive deficits  UPPER EXTREMITY ROM   AROM grossly WNL; however, noted difficulty with motor planning R>L.   UPPER EXTREMITY MMT:   Pt unable to follow directions for formal testing.  HAND FUNCTION: Grip strength: Right: 17.1 lbs; Left: 48.7 lbs  COORDINATION: 9 Hole Peg test: unable due to cognition Box and Blocks:  Right 5blocks, Left 10blocks with significant difficulty with processing, apraxia, and following directions  SENSATION: Difficult to determine due to aphasia.    COGNITION: Overall cognitive status: History of cognitive impairments - at baseline (diagnosed with vascular dementia approx 1 year ago--was forgetful, unable to write checks).  Currently, difficulty following directions, needs incr time and cueing.  Pt with aphasia (expressive>receptive per SLP) making it difficult to assess cognition  at this time.  Pt unable to zip jacket during evaluation (perseverated on this)  VISION: Subjective report: Wife reports that pt is missing items on R side.  Wife has been placing items on R side and walking on pt's R side for safety. Baseline vision: Bifocals  VISION ASSESSMENT: Impaired To be further assessed in functional context Difficult to assess due to cognitive deficits and aphasia.  PERCEPTION: Impaired: Inattention/neglect: to R side.   PRAXIS: Impaired: Motor planning, Perseveration , and turns cup too soon, unable to zip jacket  TODAY'S TREATMENT:  Evaluation completed   PATIENT EDUCATION: Education details: OT eval results/POC Person educated: Patient and Spouse Education method: Explanation Education comprehension: verbalized understanding   HOME EXERCISE PROGRAM: Not yet issued    GOALS: Potential Goals reviewed with patient? Yes  SHORT TERM GOALS: Target date: 10/06/22  Pt/caregiver will be independent with HEP for RUE functional use/coordination.   Goal status: INITIAL  2.  Pt/caregiver will verbalize understanding of visual compensation strategies and visual HEP. Goal status: INITIAL  3.  Pt will perform simple tabletop visual scanning with at least 85% accuracy. Goal status: INITIAL  4.  Pt will improve R grip strength to at least 25lbs for incr functional use. Baseline:  17.1lbs Goal status: INITIAL  5.  Pt will be able to consistently perform bilateral UE tasks with no more than min cueing (such as zipping jacket, using pump of hand sanitizer, folding towels, etc). Goal status:  INITIAL    LONG TERM GOALS: Target date: 12/06/22  Pt/caregiver will be independent with HEP for RUE functional use/strength.  Goal status: INITIAL  2.  Pt/caregiver will verbalize understanding of strategies to incr safety/ease/participation with ADLs/IADLs. Goal status: INITIAL  3.  Pt will perform simple environmental scanning with at least 85% accuracy for  incr safety. Goal status: INITIAL  4.  Pt will improve RUE coordination/functional use and cognition as shown by improving score on box and blocks to 15 with RUE. Baseline: 5 blocks Goal status: INITIAL  5.  Pt will use RUE as dominant UE for functional tasks at least 90% of the time. Goal status: INITIAL  6.  Pt will be able to perform BADLs with no more than distant supervision. Goal status: INITIAL  ASSESSMENT:  CLINICAL IMPRESSION: Patient is a 71 y.o. male who was seen today for occupational therapy evaluation s/p CVA 08/06/22 (discharged home 08/25/22).  Pt with PMH that includes:  hypertension hyperlipidemia sinus bradycardia/chronic CHF with mild regurgitation BPH history of tobacco use vascular dementia diagnosed 2022.  Pt was independent prior to CVA and driving, but needed occasional min cueing for "forgetfulness" per wife.  Pt currently needs assist/supervision for ADLs/dependent with IADLs.  Pt presents today with visual-perceptual deficits, cognitive deficits, apraxia, decr coordination, decr strength, decr balance, decr safety awareness (as well as aphasia) resulting in decr dominant RUE functional use and decr safety/independence for ADLs/IADLs.  Pt would benefit from occupational therapy to address these deficits for incr safety/independence with ADLs/IADLs, decr caregiver burden, incr RUE functional use.    PERFORMANCE DEFICITS in functional skills including ADLs, IADLs, coordination, dexterity, proprioception, strength, FMC, GMC, mobility, balance, decreased knowledge of precautions, decreased knowledge of use of DME, vision, and UE functional use, cognitive skills including attention, learn, memory, perception, problem solving, safety awareness, and understand, and psychosocial skills including environmental adaptation, habits, and routines and behaviors.   IMPAIRMENTS are limiting patient from ADLs, IADLs, and leisure.   COMORBIDITIES has co-morbidities such as vascular  dementia  that affects occupational performance. Patient will benefit from skilled OT to address above impairments and improve overall function.  MODIFICATION OR ASSISTANCE TO COMPLETE EVALUATION: Min-Moderate modification of tasks or assist with assess necessary to complete an evaluation.  OT OCCUPATIONAL PROFILE AND HISTORY: Detailed assessment: Review of records and additional review of physical, cognitive, psychosocial history related to current functional performance.  CLINICAL DECISION MAKING: Moderate - several treatment options, min-mod task modification necessary  REHAB POTENTIAL: Good  EVALUATION COMPLEXITY: Moderate    PLAN: OT FREQUENCY: 2x/week  OT DURATION: 12 weeks +eval  PLANNED INTERVENTIONS: self care/ADL training, therapeutic exercise, therapeutic activity, neuromuscular re-education, passive range of motion, balance training, functional mobility training, fluidotherapy, moist heat, cryotherapy, patient/family education, cognitive remediation/compensation, visual/perceptual remediation/compensation, energy conservation, coping strategies training, and DME and/or AE instructions  RECOMMENDED OTHER SERVICES: current with Speech therapy, needs to schedule PT (referral in Epic)  CONSULTED AND AGREED WITH PLAN OF CARE: Patient and family member/caregiver  PLAN FOR NEXT SESSION: initiate HEP for RUE and bilateral UE functional use, simple visual scanning   Semya Klinke, OTR/L 09/07/2022, 1:15 PM

## 2022-09-08 NOTE — Progress Notes (Deleted)
Cardiology Office Note:    Date:  09/08/2022   ID:  Dustin Blanchard, DOB [birth year], MRN [id number]  PCP:  [facility] Cardiologist:  Donato Heinz, MD { Click to update primary MD,subspecialty MD or APP then REFRESH:1}    Referring MD: [facility] [place] [facility]*   No chief complaint on file. ***  History of Present Illness:    Dustin Blanchard is a 71 y.o. male with a hx of ***  Past Medical History:  Diagnosis Date   Dementia ([place])     Past Surgical History:  Procedure Laterality Date   HERNIA REPAIR     HIP SURGERY     IR CT HEAD LTD  08/06/2022   IR CT HEAD LTD  08/06/2022   IR PERCUTANEOUS ART THROMBECTOMY/INFUSION INTRACRANIAL INC DIAG ANGIO  08/06/2022   IR US GUIDE VASC ACCESS RIGHT  08/06/2022   RADIOLOGY WITH ANESTHESIA N/A 08/06/2022   Procedure: IR WITH ANESTHESIA;  Surgeon: Luanne Bras, MD;  Location: [place];  Service: Radiology;  Laterality: N/A;    Current Medications: No outpatient medications have been marked as taking for the 09/09/22 encounter (Appointment) with Ledora Bottcher, [place].     Allergies:   Patient has no known allergies.   Social History   Socioeconomic History   Marital status: Married    Spouse name: Not on file   Number of children: Not on file   Years of education: Not on file   Highest education level: Not on file  Occupational History   Not on file  Tobacco Use   Smoking status: Former   Smokeless tobacco: Never  Vaping Use   Vaping Use: Never used  Substance and Sexual Activity   Alcohol use: No   Drug use: Not Currently   Sexual activity: Never  Other Topics Concern   Not on file  Social History Narrative   Not on file   Social Determinants of Health   Financial Resource Strain: Not on file  Food Insecurity: Not on file  Transportation Needs: Not on file  Physical Activity: Not on file  Stress: Not on file  Social Connections: Not on file      Family History: The patient's ***family history is not on file.  ROS:   Please see the history of present illness.    *** All other systems reviewed and are negative.  EKGs/Labs/Other Studies Reviewed:    The following studies were reviewed today: ***  EKG:  EKG is *** ordered today.  The ekg ordered today demonstrates ***  Recent Labs: 08/12/2022: Magnesium 2.2 08/13/2022: ALT 14 08/22/2022: BUN 9; Creatinine, Ser 0.94; Potassium 3.6; Sodium 144 08/25/2022: Hemoglobin 11.3; Platelets 288  Recent Lipid Panel    Component Value Date/Time   CHOL 130 08/07/2022 0241   TRIG 60 08/07/2022 0241   HDL 47 08/07/2022 0241   CHOLHDL 2.8 08/07/2022 0241   VLDL 12 08/07/2022 0241   LDLCALC 71 08/07/2022 0241     Risk Assessment/Calculations:   {Does this patient have ATRIAL FIBRILLATION?[phone]}  No BP recorded.  {Refresh Note OR Click here to enter BP  :1}***         Physical Exam:    VS:  There were no vitals taken for this visit.    Wt Readings from Last 3 Encounters:  08/25/22 139 lb 5.3 oz (63.2 kg)  08/12/22 150 lb 5.7 oz (68.2 kg)  09/07/16 155 lb (70.3 kg)  GEN: *** Well nourished, well developed in no acute distress HEENT: Normal NECK: No JVD; No carotid bruits LYMPHATICS: No lymphadenopathy CARDIAC: ***RRR, no murmurs, rubs, gallops RESPIRATORY:  Clear to auscultation without rales, wheezing or rhonchi  ABDOMEN: Soft, non-tender, non-distended MUSCULOSKELETAL:  No edema; No deformity  SKIN: Warm and dry NEUROLOGIC:  Alert and oriented x 3 PSYCHIATRIC:  Normal affect   ASSESSMENT:    No diagnosis found. PLAN:    In order of problems listed above:  ***      {Are you ordering a CV Procedure (e.g. stress test, cath, DCCV, TEE, etc)?   Press F2        :[id number]    Medication Adjustments/Labs and Tests Ordered: Current medicines are reviewed at length with the patient today.  Concerns regarding medicines are outlined above.  No orders of the  defined types were placed in this encounter.  No orders of the defined types were placed in this encounter.   There are no Patient Instructions on file for this visit.   Signed, Ledora Bottcher, [place]  09/08/2022 3:34 PM    [place]

## 2022-09-09 ENCOUNTER — Ambulatory Visit: Payer: Medicare HMO | Admitting: Physician Assistant

## 2022-09-09 NOTE — Progress Notes (Unsigned)
Cardiology Clinic Note   Patient Name: Dustin Blanchard Date of Encounter: 09/10/2022  Primary Care Provider:  [facility] Primary Cardiologist:  Donato Heinz, MD  Patient Profile    Dustin Blanchard 71 year old male presents to the clinic today for follow-up evaluation of his atrial fibrillation and acute on chronic combined systolic and diastolic CHF.  Past Medical History    Past Medical History:  Diagnosis Date   Dementia [facility])    Past Surgical History:  Procedure Laterality Date   HERNIA REPAIR     HIP SURGERY     IR CT HEAD LTD  08/06/2022   IR CT HEAD LTD  08/06/2022   IR PERCUTANEOUS ART THROMBECTOMY/INFUSION INTRACRANIAL INC DIAG ANGIO  08/06/2022   IR US GUIDE VASC ACCESS RIGHT  08/06/2022   RADIOLOGY WITH ANESTHESIA N/A 08/06/2022   Procedure: IR WITH ANESTHESIA;  Surgeon: Luanne Bras, MD;  Location: [place];  Service: Radiology;  Laterality: N/A;    Allergies  No Known Allergies  History of Present Illness    [name] has a PMH of CVA, sinus pause, acute combined systolic and diastolic CHF, atrial fibrillation, and pneumonia.  He was admitted to the hospital on 08/12/2022 and discharged on 08/25/2022.  He presented with acute right-sided weakness and aphasia.  Head CT showed small left PCA infarct of the left occipital lobe.  His CT angiogram head and neck were positive for left MCA territoryoligemia.  Cardiology was consulted for new onset atrial fibrillation with RVR and CHF.  His echocardiogram showed an LVEF of 35-40% and no thrombus.  He was initially placed on diltiazem drip and was transitioned to amiodarone.  Plans were made for outpatient ischemic evaluation after recovery from CVA.  He was started on apixaban.  He was placed on alternate nutritional support.  He was noted to have episodes of sundowning and noted to have a history of vascular dementia.  He was noted to have decreased functional mobility and admitted for  comprehensive rehab.  He presents to the clinic today for follow-up evaluation states he is doing okay.  He is doing physical therapy 2 times per week.  He presents with his wife.  We reviewed his most recent hospitalization and plan for treatment.  They expressed understanding.  He has some lower extremity weakness.  He also has some pressured speech.  I will refill his cardiac medications, order a BMP, CBC, have him increase his physical activity as tolerated/continue physical therapy and plan follow-up in 3 to 4 months.  Today he denies chest pain, shortness of breath, lower extremity edema, fatigue, palpitations, melena, hematuria, hemoptysis, diaphoresis, weakness, presyncope, syncope, orthopnea, and PND.  Home Medications    Prior to Admission medications   Medication Sig Start Date End Date Taking? Authorizing Provider  acetaminophen (TYLENOL) 325 MG tablet Take 2 tablets (650 mg total) by mouth every 4 (four) hours as needed for mild pain (or temp > 37.5 C (99.5 F)). 08/25/22   Angiulli, Lavon Paganini, PA-C  amiodarone (PACERONE) 200 MG tablet Take 1 tablet (200 mg total) by mouth daily. 08/25/22   Angiulli, Lavon Paganini, PA-C  apixaban (ELIQUIS) 5 MG TABS tablet Take 1 tablet (5 mg total) by mouth 2 (two) times daily. 08/25/22   Angiulli, Lavon Paganini, PA-C  atorvastatin (LIPITOR) 40 MG tablet Take 1 tablet (40 mg total) by mouth daily. 08/25/22   Angiulli, Lavon Paganini, PA-C  B Complex-C (B-COMPLEX WITH VITAMIN C) tablet Take 1 tablet by mouth  daily. 08/25/22   Angiulli, Lavon Paganini, PA-C  cholecalciferol (VITAMIN D3) 10 MCG (400 UNIT) TABS tablet Take 2 tablets (800 Units total) by mouth daily. 08/25/22   Angiulli, Lavon Paganini, PA-C  magnesium gluconate (MAGONATE) 500 MG tablet Take 0.5 tablets (250 mg total) by mouth at bedtime. 08/25/22   Angiulli, Lavon Paganini, PA-C  melatonin 3 MG TABS tablet Take 1 tablet (3 mg total) by mouth at bedtime. 08/25/22   Angiulli, Lavon Paganini, PA-C  mirtazapine (REMERON) 7.5 MG tablet Take 1 tablet  (7.5 mg total) by mouth at bedtime. 08/25/22   Angiulli, Lavon Paganini, PA-C  Multiple Vitamin (MULTIVITAMIN WITH MINERALS) TABS tablet Take 1 tablet by mouth daily. 08/25/22   Angiulli, Lavon Paganini, PA-C  pantoprazole sodium (PROTONIX) 40 mg Take 40 mg by mouth at bedtime. 08/25/22   Angiulli, Lavon Paganini, PA-C  polyethylene glycol (MIRALAX / GLYCOLAX) 17 g packet Take 17 g by mouth daily. 08/25/22   Angiulli, Lavon Paganini, PA-C  QUEtiapine (SEROQUEL) 25 MG tablet Take 1 tablet (25 mg total) by mouth daily with supper. 08/25/22   Angiulli, Lavon Paganini, PA-C  spironolactone (ALDACTONE) 25 MG tablet Take 1 tablet (25 mg total) by mouth daily. 08/25/22   Angiulli, Lavon Paganini, PA-C  tamsulosin (FLOMAX) 0.4 MG CAPS capsule Take 1 capsule (0.4 mg total) by mouth daily. 08/25/22   Angiulli, Lavon Paganini, PA-C  valproic acid (DEPAKENE) 250 MG/5ML solution Take 5 mLs (250 mg total) by mouth at bedtime. 08/25/22   Angiulli, Lavon Paganini, PA-C    Family History    History reviewed. No pertinent family history. has no family status information on file.   Social History    Social History   Socioeconomic History   Marital status: Married    Spouse name: Not on file   Number of children: Not on file   Years of education: Not on file   Highest education level: Not on file  Occupational History   Not on file  Tobacco Use   Smoking status: Former   Smokeless tobacco: Never  Vaping Use   Vaping Use: Never used  Substance and Sexual Activity   Alcohol use: No   Drug use: Not Currently   Sexual activity: Never  Other Topics Concern   Not on file  Social History Narrative   Not on file   Social Determinants of Health   Financial Resource Strain: Not on file  Food Insecurity: Not on file  Transportation Needs: Not on file  Physical Activity: Not on file  Stress: Not on file  Social Connections: Not on file  Intimate Partner Violence: Not on file     Review of Systems    General:  No chills, fever, night sweats or weight  changes.  Cardiovascular:  No chest pain, dyspnea on exertion, edema, orthopnea, palpitations, paroxysmal nocturnal dyspnea. Dermatological: No rash, lesions/masses Respiratory: No cough, dyspnea Urologic: No hematuria, dysuria Abdominal:   No nausea, vomiting, diarrhea, bright red blood per rectum, melena, or hematemesis Neurologic:  No visual changes, wkns, changes in mental status. All other systems reviewed and are otherwise negative except as noted above.  Physical Exam    VS:  BP (!) 102/56   Pulse (!) 52   Ht 5\' 11"  (1.803 m)   Wt 133 lb (60.3 kg)   SpO2 97%   BMI 18.55 kg/m  , BMI Body mass index is 18.55 kg/m. GEN: Well nourished, well developed, in no acute distress. HEENT: normal. Neck: Supple, no JVD, carotid  bruits, or masses. Cardiac: RRR, no murmurs, rubs, or gallops. No clubbing, cyanosis, edema.  Radials/DP/PT 2+ and equal bilaterally.  Respiratory:  Respirations regular and unlabored, clear to auscultation bilaterally. GI: Soft, nontender, nondistended, BS + x 4. MS: no deformity or atrophy.  Lower extremity weakness Skin: warm and dry, no rash. Neuro:  Strength and sensation are intact.  Pressured speech Psych: Normal affect.  Accessory Clinical Findings    Recent Labs: 08/12/2022: Magnesium 2.2 08/13/2022: ALT 14 08/22/2022: BUN 9; Creatinine, Ser 0.94; Potassium 3.6; Sodium 144 08/25/2022: Hemoglobin 11.3; Platelets 288   Recent Lipid Panel    Component Value Date/Time   CHOL 130 08/07/2022 0241   TRIG 60 08/07/2022 0241   HDL 47 08/07/2022 0241   CHOLHDL 2.8 08/07/2022 0241   VLDL 12 08/07/2022 0241   LDLCALC 71 08/07/2022 0241         ECG personally reviewed by me today-none today.  Echocardiogram 08/06/2022  IMPRESSIONS     1. Coarse trabeculation of the apex without obvious thrombus. Left  ventricular ejection fraction, by estimation, is 35 to 40%. Left  ventricular ejection fraction by 2D MOD biplane is 37.1 %. The left  ventricle has  moderately decreased function. The left   ventricle demonstrates global hypokinesis. There is mild left ventricular  hypertrophy. Left ventricular diastolic parameters are consistent with  Grade III diastolic dysfunction (restrictive). Elevated left ventricular  end-diastolic pressure.   2. Right ventricular systolic function is low normal. The right  ventricular size is normal. There is mildly elevated pulmonary artery  systolic pressure. The estimated right ventricular systolic pressure is  35.5 mmHg.   3. Left atrial size was severely dilated.   4. Moderate pericardial effusion. The pericardial effusion is  circumferential. There is no evidence of cardiac tamponade.   5. The mitral valve is abnormal. Moderate mitral valve regurgitation.   6. The aortic valve is tricuspid. Aortic valve regurgitation is trivial.   7. The inferior vena cava is normal in size with greater than 50%  respiratory variability, suggesting right atrial pressure of 3 mmHg.   Comparison(s): No prior Echocardiogram.   Assessment & Plan   1.  Acute on chronic systolic and diastolic CHF-slowly continues to improve physical activity.  Continues to do physical therapy for CVA.  Echocardiogram showed LVEF of 35-40% with G3 DD.  Plan for ischemic evaluation once he has recovered from CVA.  Limited GDMT due to soft blood pressure. Continue spironolactone Heart healthy low-sodium diet-salty 6 given Increase physical activity as tolerated Recommend follow-up echocardiogram in 2-3 months. Order BMP  Atrial fibrillation-heart rate today 52 bpm.  This patients CHA2DS2-VASc Score and unadjusted Ischemic Stroke Rate (% per year) is equal to 4.8 % stroke rate/year from a score of 4 (CHF, age, CVA).  Reports compliance with Eliquis and denies bleeding issues. Continue amiodarone, apixaban Heart healthy low-sodium diet-salty 6 given Increase physical activity as tolerated Avoid triggers caffeine, chocolate, EtOH, dehydration  etc. Order CBC  CVA-May received TNK as well as endovascular revascularization for left M3 MCA on 08/06/2022 via interventional radiology.  Echocardiogram showed no thrombus. Continue apixaban Follows with neurology  Disposition: Follow-up with Dr. Gardiner Rhyme in 3-4 months.   Jossie Ng. Meisha Salone NP-C     09/10/2022, 3:16 PM [facility] [place] [address] Office [phone] Fax [phone]  Notice: This dictation was prepared with Dragon dictation along with smaller phrase technology. Any transcriptional errors that result from this process are unintentional and may not  be corrected upon review.  I spent 14 minutes examining this patient, reviewing medications, and using patient centered shared decision making involving her cardiac care.  Prior to her visit I spent greater than 20 minutes reviewing her past medical history,  medications, and prior cardiac tests.

## 2022-09-10 ENCOUNTER — Encounter: Payer: Self-pay | Admitting: General Practice

## 2022-09-10 ENCOUNTER — Ambulatory Visit: Payer: Medicare HMO | Attending: Physician Assistant | Admitting: General Practice

## 2022-09-10 VITALS — BP 102/56 | HR 52 | Ht 71.0 in | Wt 133.0 lb

## 2022-09-10 DIAGNOSIS — Z8673 Personal history of transient ischemic attack (TIA), and cerebral infarction without residual deficits: Secondary | ICD-10-CM

## 2022-09-10 DIAGNOSIS — I5043 Acute on chronic combined systolic (congestive) and diastolic (congestive) heart failure: Secondary | ICD-10-CM

## 2022-09-10 DIAGNOSIS — I4891 Unspecified atrial fibrillation: Secondary | ICD-10-CM | POA: Diagnosis not present

## 2022-09-10 MED ORDER — APIXABAN 5 MG PO TABS: 5.0000 mg | ORAL_TABLET | Freq: Two times a day (BID) | ORAL | 4 refills | Status: DC

## 2022-09-10 MED ORDER — AMIODARONE HCL 200 MG PO TABS: 200.0000 mg | ORAL_TABLET | Freq: Every day | ORAL | 4 refills | Status: DC

## 2022-09-10 MED ORDER — SPIRONOLACTONE 25 MG PO TABS: 25.0000 mg | ORAL_TABLET | Freq: Every day | ORAL | 4 refills | Status: DC

## 2022-09-10 NOTE — Patient Instructions (Signed)
Medication Instructions:  The current medical regimen is effective;  continue present plan and medications as directed. Please refer to the Current Medication list given to you today.  *If you need a refill on your cardiac medications before your next appointment, please call your pharmacy*   Lab Work: [place] If you have labs (blood work) drawn today and your tests are completely normal, you will receive your results only by:  [place] (if you have MyChart) OR  A paper copy in the mail If you have any lab test that is abnormal or we need to change your treatment, we will call you to review the results.  Follow-Up: At [facility], you and your health needs are our priority.  As part of our continuing mission to provide you with exceptional heart care, we have created designated Provider Care Teams.  These Care Teams include your primary Cardiologist (physician) and Advanced Practice Providers (APPs -  Physician Assistants and Nurse Practitioners) who all work together to provide you with the care you need, when you need it.  We recommend signing up for the patient portal called "MyChart".  Sign up information is provided on this After Visit Summary.  MyChart is used to connect with patients for Virtual Visits (Telemedicine).  Patients are able to view lab/test results, encounter notes, upcoming appointments, etc.  Non-urgent messages can be sent to your provider as well.   To learn more about what you can do with MyChart, go to [email or web address].    Your next appointment:   3 month(s)  The format for your next appointment:   In Person  Provider:   Donato Heinz, MD  or Coletta Memos, FNP       Other Instructions PLEASE READ AND FOLLOW ATTACHED SALTY 6  TAKE AND LOG YOUR WEIGHT DAILY  Important Information About Sugar

## 2022-09-11 LAB — CBC
Hematocrit: 39.3 % (ref 37.5–51.0)
Hemoglobin: 12.8 g/dL — ABNORMAL LOW (ref 13.0–17.7)
MCH: 31.2 pg (ref 26.6–33.0)
MCHC: 32.6 g/dL (ref 31.5–35.7)
MCV: 96 fL (ref 79–97)
Platelets: 230 10*3/uL (ref 150–450)
RBC: 4.1 x10E6/uL — ABNORMAL LOW (ref 4.14–5.80)
RDW: 12.3 % (ref 11.6–15.4)
WBC: 4 10*3/uL (ref 3.4–10.8)

## 2022-09-11 LAB — BASIC METABOLIC PANEL
BUN/Creatinine Ratio: 9 — ABNORMAL LOW (ref 10–24)
BUN: 10 mg/dL (ref 8–27)
CO2: 25 mmol/L (ref 20–29)
Calcium: 10 mg/dL (ref 8.6–10.2)
Chloride: 104 mmol/L (ref 96–106)
Creatinine, Ser: 1.13 mg/dL (ref 0.76–1.27)
Glucose: 115 mg/dL — ABNORMAL HIGH (ref 70–99)
Potassium: 5.3 mmol/L — ABNORMAL HIGH (ref 3.5–5.2)
Sodium: 142 mmol/L (ref 134–144)
eGFR: 70 mL/min/{1.73_m2} (ref >59)

## 2022-09-13 NOTE — Therapy (Signed)
OUTPATIENT OCCUPATIONAL THERAPY NEURO EVALUATION  Patient Name: Dustin Blanchard MRN: [id number] DOB:May 07, 1951, 71 y.o., male Today's Date: 09/14/2022  PCP: [place] PROVIDER: Dr. Leeroy Cha   OT End of Session - 09/14/22 1228     Visit Number 2    Number of Visits 25    Date for OT Re-Evaluation 12/06/22    Authorization Type [employer] insurance verifications    Authorization - Visit Number 2    Authorization - Number of Visits 10    Progress Note Due on Visit 10    OT Start Time 4742    OT Stop Time 1315    OT Time Calculation (min) 40 min    Activity Tolerance Patient tolerated treatment well    Behavior During Therapy [facility] for tasks assessed/performed;Flat affect              Past Medical History:  Diagnosis Date   Dementia [facility])    Past Surgical History:  Procedure Laterality Date   HERNIA REPAIR     HIP SURGERY     IR CT HEAD LTD  08/06/2022   IR CT HEAD LTD  08/06/2022   IR PERCUTANEOUS ART THROMBECTOMY/INFUSION INTRACRANIAL INC DIAG ANGIO  08/06/2022   IR US GUIDE VASC ACCESS RIGHT  08/06/2022   RADIOLOGY WITH ANESTHESIA N/A 08/06/2022   Procedure: IR WITH ANESTHESIA;  Surgeon: Luanne Bras, MD;  Location: [place];  Service: Radiology;  Laterality: N/A;   Patient Active Problem List   Diagnosis Date Noted   Pneumonia of right lower lobe due to infectious organism    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   Sinus pause    Acute combined systolic and diastolic heart failure (HCC)    Atrial fibrillation with RVR (HCC)    Acute on chronic combined systolic and diastolic CHF (congestive heart failure) ([place])    Acute ischemic left MCA stroke ([place]) 08/06/2022    ONSET DATE: 08/06/22  REFERRING DIAG: I63.9 (ICD-10-CM) - CVA (cerebral vascular accident) ([place])   THERAPY DIAG:  Other symptoms and signs involving cognitive functions following cerebral infarction  Frontal lobe and executive function  deficit  Attention and concentration deficit  Visuospatial deficit  Hemiplegia and hemiparesis following cerebral infarction affecting right dominant side (HCC)  Other lack of coordination  Unsteadiness on feet  Apraxia  Rationale for Evaluation and Treatment Rehabilitation  SUBJECTIVE:   SUBJECTIVE STATEMENT: Nothing new per wife    Pt accompanied by: significant other    PERTINENT HISTORY: Presented to hospital 08/06/2022 with acute onset of right-sided weakness and aphasia. Cranial CT scan showed small age-indeterminate left PCA infarct of the left occipital lobe.  Patient did receive TNK as well as endovascular revascularization of occlusion of left M3 MCA middle division branch 08/06/2022 per interventional radiology. In hospital, cardiology service consulted for new A-fib RVR/CHF.    PMH:  hypertension hyperlipidemia sinus bradycardia/chronic CHF with mild regurgitation BPH history of tobacco use vascular dementia diagnosed 2022  PRECAUTIONS: Fall and Other: aphasia (particularly expressive), Dysphagia #1 nectar thick liquids   WEIGHT BEARING RESTRICTIONS No  PAIN:  Are you having pain? No  FALLS: Has patient fallen in last 6 months? Yes. Number of falls morning of CVA  LIVING ENVIRONMENT: Lives with: lives with their spouse Lives in: House/apartment Stairs: Yes: External: 4 steps; bilateral Has following equipment at home: Walker - 2 wheeled, Shower bench, bed side commode, and transport w/c, handheld shower head  PLOF: Independent, Vocation/Vocational requirements: machine shop, small-scale farming,  and Leisure: gardening, repairs around the house   PATIENT GOALS be more independent, use RUE more, attend to R side more.  OBJECTIVE:   HAND DOMINANCE: Right   TODAY'S TREATMENT:   Pt unable to place tissue in pocket and wife assisted.  Pt needed min-mod A for hand placement for hand sanitizer/pump, able to "wash hands" without cueing.    Pt/wife instructed in  HEP--see pt instructions  PATIENT EDUCATION: Education details: RUE and bilateral hand functional use HEP--see pt instructions Person educated: Patient and Spouse Education method: Explanation, Demonstration, Tactile cues, Verbal cues, and Handouts Education comprehension: verbalized understanding, returned demonstration, verbal cues required, tactile cues required, and needs further education   HOME EXERCISE PROGRAM: 09/14/22:  RUE and bilateral hand functional use HEP     GOALS: Potential Goals reviewed with patient? Yes  SHORT TERM GOALS: Target date: 10/06/22  Pt/caregiver will be independent with HEP for RUE functional use/coordination.   Goal status: INITIAL  2.  Pt/caregiver will verbalize understanding of visual compensation strategies and visual HEP. Goal status: INITIAL  3.  Pt will perform simple tabletop visual scanning with at least 85% accuracy. Goal status: INITIAL  4.  Pt will improve R grip strength to at least 25lbs for incr functional use. Baseline:  17.1lbs Goal status: INITIAL  5.  Pt will be able to consistently perform bilateral UE tasks with no more than min cueing (such as zipping jacket, using pump of hand sanitizer, folding towels, etc). Goal status: INITIAL    LONG TERM GOALS: Target date: 12/06/22  Pt/caregiver will be independent with HEP for RUE functional use/strength.  Goal status: INITIAL  2.  Pt/caregiver will verbalize understanding of strategies to incr safety/ease/participation with ADLs/IADLs. Goal status: INITIAL  3.  Pt will perform simple environmental scanning with at least 85% accuracy for incr safety. Goal status: INITIAL  4.  Pt will improve RUE coordination/functional use and cognition as shown by improving score on box and blocks to 15 with RUE. Baseline: 5 blocks Goal status: INITIAL  5.  Pt will use RUE as dominant UE for functional tasks at least 90% of the time. Goal status: INITIAL  6.  Pt will be able to  perform BADLs with no more than distant supervision. Goal status: INITIAL  ASSESSMENT:  CLINICAL IMPRESSION: Pt demo improve RUE functional use with repetition and verbal/tactile/demo cueing.  Pt needs significant cueing for RUE use and following directions initially.  Marland Kitchen    PERFORMANCE DEFICITS in functional skills including ADLs, IADLs, coordination, dexterity, proprioception, strength, FMC, GMC, mobility, balance, decreased knowledge of precautions, decreased knowledge of use of DME, vision, and UE functional use, cognitive skills including attention, learn, memory, perception, problem solving, safety awareness, and understand, and psychosocial skills including environmental adaptation, habits, and routines and behaviors.   IMPAIRMENTS are limiting patient from ADLs, IADLs, and leisure.   COMORBIDITIES has co-morbidities such as vascular dementia  that affects occupational performance. Patient will benefit from skilled OT to address above impairments and improve overall function.  MODIFICATION OR ASSISTANCE TO COMPLETE EVALUATION: Min-Moderate modification of tasks or assist with assess necessary to complete an evaluation.  OT OCCUPATIONAL PROFILE AND HISTORY: Detailed assessment: Review of records and additional review of physical, cognitive, psychosocial history related to current functional performance.  CLINICAL DECISION MAKING: Moderate - several treatment options, min-mod task modification necessary  REHAB POTENTIAL: Good  EVALUATION COMPLEXITY: Moderate    PLAN: OT FREQUENCY: 2x/week  OT DURATION: 12 weeks +eval  PLANNED INTERVENTIONS: self care/ADL  training, therapeutic exercise, therapeutic activity, neuromuscular re-education, passive range of motion, balance training, functional mobility training, fluidotherapy, moist heat, cryotherapy, patient/family education, cognitive remediation/compensation, visual/perceptual remediation/compensation, energy conservation, coping  strategies training, and DME and/or AE instructions  RECOMMENDED OTHER SERVICES: current with Speech therapy, needs to schedule PT (referral in Epic)  CONSULTED AND AGREED WITH PLAN OF CARE: Patient and family member/caregiver  PLAN FOR NEXT SESSION:  review HEP, RUE functional use, simple visual scanning   Milessa Hogan, OTR/L 09/14/2022, 2:20 PM

## 2022-09-14 ENCOUNTER — Telehealth: Payer: Self-pay | Admitting: General Practice

## 2022-09-14 ENCOUNTER — Ambulatory Visit: Payer: Medicare HMO | Admitting: Speech Pathology

## 2022-09-14 ENCOUNTER — Ambulatory Visit: Payer: Medicare HMO | Admitting: Occupational Therapy

## 2022-09-14 ENCOUNTER — Other Ambulatory Visit: Payer: Self-pay

## 2022-09-14 ENCOUNTER — Encounter: Payer: Self-pay | Admitting: Speech Pathology

## 2022-09-14 ENCOUNTER — Encounter: Payer: Self-pay | Admitting: Occupational Therapy

## 2022-09-14 ENCOUNTER — Ambulatory Visit: Payer: Medicare HMO | Admitting: Physical Therapy

## 2022-09-14 DIAGNOSIS — I69318 Other symptoms and signs involving cognitive functions following cerebral infarction: Secondary | ICD-10-CM | POA: Diagnosis not present

## 2022-09-14 DIAGNOSIS — R1312 Dysphagia, oropharyngeal phase: Secondary | ICD-10-CM

## 2022-09-14 DIAGNOSIS — E875 Hyperkalemia: Secondary | ICD-10-CM

## 2022-09-14 DIAGNOSIS — R278 Other lack of coordination: Secondary | ICD-10-CM

## 2022-09-14 DIAGNOSIS — R2689 Other abnormalities of gait and mobility: Secondary | ICD-10-CM

## 2022-09-14 DIAGNOSIS — R41841 Cognitive communication deficit: Secondary | ICD-10-CM

## 2022-09-14 DIAGNOSIS — R482 Apraxia: Secondary | ICD-10-CM

## 2022-09-14 DIAGNOSIS — R41844 Frontal lobe and executive function deficit: Secondary | ICD-10-CM

## 2022-09-14 DIAGNOSIS — R2681 Unsteadiness on feet: Secondary | ICD-10-CM

## 2022-09-14 DIAGNOSIS — I69351 Hemiplegia and hemiparesis following cerebral infarction affecting right dominant side: Secondary | ICD-10-CM

## 2022-09-14 DIAGNOSIS — R41842 Visuospatial deficit: Secondary | ICD-10-CM

## 2022-09-14 DIAGNOSIS — R4701 Aphasia: Secondary | ICD-10-CM

## 2022-09-14 DIAGNOSIS — R41840 Attention and concentration deficit: Secondary | ICD-10-CM

## 2022-09-14 NOTE — Therapy (Signed)
OUTPATIENT PHYSICAL THERAPY NEURO EVALUATION   Patient Name: Dustin Blanchard MRN: [id number] DOB:1951/03/20, 72 y.o., male Today's Date: 09/14/2022   PCP: Dr. Loletha Grayer at [facility] PROVIDER: Barbie Banner, PA-C    PT End of Session - 09/14/22 1321     Visit Number 1    Number of Visits 17   with eval   Date for PT Re-Evaluation 11/23/22   to allow for scheduling delays   Authorization Type [employer]    Progress Note Due on Visit 10    PT Start Time 1320   from OT session   PT Stop Time 1355   evaluation   PT Time Calculation (min) 35 min    Equipment Utilized During Treatment Gait belt    Activity Tolerance Patient tolerated treatment well    Behavior During Therapy [facility] for tasks assessed/performed;Flat affect             Past Medical History:  Diagnosis Date   Dementia [facility])    Past Surgical History:  Procedure Laterality Date   HERNIA REPAIR     HIP SURGERY     IR CT HEAD LTD  08/06/2022   IR CT HEAD LTD  08/06/2022   IR PERCUTANEOUS ART THROMBECTOMY/INFUSION INTRACRANIAL INC DIAG ANGIO  08/06/2022   IR US GUIDE VASC ACCESS RIGHT  08/06/2022   RADIOLOGY WITH ANESTHESIA N/A 08/06/2022   Procedure: IR WITH ANESTHESIA;  Surgeon: Luanne Bras, MD;  Location: [place];  Service: Radiology;  Laterality: N/A;   Patient Active Problem List   Diagnosis Date Noted   Pneumonia of right lower lobe due to infectious organism    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   Sinus pause    Acute combined systolic and diastolic heart failure (HCC)    Atrial fibrillation with RVR (HCC)    Acute on chronic combined systolic and diastolic CHF (congestive heart failure) ([facility]in [facility])    Acute ischemic left MCA stroke ([facility]in [facility]) 08/06/2022    ONSET DATE: 08/26/2022   REFERRING DIAG: I63.9 (ICD-10-CM) - CVA (cerebral vascular accident) ([facility])   THERAPY DIAG:  Unsteadiness on feet  Other abnormalities of gait and mobility  Rationale for Evaluation and  Treatment Rehabilitation  SUBJECTIVE:                                                                                                                                                                                              SUBJECTIVE STATEMENT: Pt is non-verbal throughout session but able to nod "yes/no". [name] provides subjective and background information. She report that the pt has been walking more  at home, feeding himself, taking himself to the bathroom, etc. She also reports that pt tends to drag his R foot when walking, doesn't always remember to use the RW, and has some trouble with drinking from a cup.   Pt accompanied by: significant other wife Dustin Blanchard  PERTINENT HISTORY: hypertension, hyperlipidemia, sinus bradycardia/chronic CHF with mild regurgitation, BPH, history of tobacco use, vascular dementia diagnosed 2022   PAIN:  Are you having pain? No  PRECAUTIONS: Fall and Other: aphasia, dysphagia 1 (nectar-thick)  WEIGHT BEARING RESTRICTIONS No  FALLS: Has patient fallen in last 6 months? Yes. Number of falls one fall when he had the stroke, no other falls  LIVING ENVIRONMENT: Lives with: lives with their spouse Lives in: House/apartment Stairs: Yes: External: 4 steps; can reach both Has following equipment at home: Walker - 2 wheeled and transport chair  PLOF: Needs assistance with gait, Needs assistance with transfers, and uses RW in the home with Supervision, uses transport chair for community mobility  PATIENT GOALS "to get him back to moving and walking better"  OBJECTIVE:   DIAGNOSTIC FINDINGS:  Brain MRI 08/06/2022 IMPRESSION: 1. Multifocal acute/subacute ischemia in the left MCA territory, predominantly involving the left frontal operculum and posterior temporal lobe. 2. Small volume hemorrhage at the left external capsule, favored to be old.  Head CT 08/14/2022 IMPRESSION: 1. Small focus of acute to early subacute intraparenchymal blood in the left  external capsule, unchanged in size compared to the MRI from 08/06/2022 and without significant mass effect. 2. Resolved hyperdensity overlying the left cerebral convexity which likely reflected primarily extravasated contrast. 3. Evolving infarcts in the left MCA distribution. 4. No new extra-axial blood or new acute territorial infarct.   COGNITION: Overall cognitive status: Difficulty to assess due to: Communication impairment, dementia   SENSATION: Difficult to assess due to cognition and pt being non-verbal  COORDINATION: Unable to assess due to cognition and inability to follow cues  EDEMA:  None present at evaluation  POSTURE: rounded shoulders, forward head, and posterior pelvic tilt  LOWER EXTREMITY MMT:    MMT Right Eval Left Eval  Hip flexion 4+ 5  Hip extension    Hip abduction    Hip adduction    Hip internal rotation    Hip external rotation    Knee flexion 4+ 5  Knee extension 4+ 5  Ankle dorsiflexion 4+ 5  Ankle plantarflexion    Ankle inversion    Ankle eversion    (Blank rows = not tested)  BED MOBILITY:  Independent per wife's report  TRANSFERS: Assistive device utilized: Environmental consultant - 2 wheeled  Sit to stand: SBA Stand to sit: SBA Chair to chair: SBA Floor:  not safe to assist at evaluation  FUNCTIONAL TESTs:    [facility] PT Assessment - 09/14/22 1334       Ambulation/Gait   Gait velocity 32.8 ft over 18 sec = 1.82 ft/sec      Standardized Balance Assessment   Standardized Balance Assessment Berg Balance Test;Timed Up and Go Test;Five Times Sit to Stand    Five times sit to stand comments  34.62 sec, BUE support      Berg Balance Test   Sit to Stand Able to stand  independently using hands    Standing Unsupported Able to stand 2 minutes with supervision    Sitting with Back Unsupported but Feet Supported on Floor or Stool Able to sit safely and securely 2 minutes    Stand to Sit Controls descent by using  hands    Transfers Able to transfer  safely, definite need of hands    Standing Unsupported with Eyes Closed Able to stand 3 seconds    Standing Unsupported with Feet Together Needs help to attain position but able to stand for 30 seconds with feet together    From Standing, Reach Forward with Outstretched Arm Loses balance while trying/requires external support    From Standing Position, Pick up Object from Floor Unable to try/needs assist to keep balance    From Standing Position, Turn to Look Behind Over each Shoulder Needs supervision when turning    Turn 360 Degrees Needs close supervision or verbal cueing    Standing Unsupported, Alternately Place Feet on Step/Stool Needs assistance to keep from falling or unable to try    Standing Unsupported, One Foot in [place] to take small step independently and hold 30 seconds    Standing on One Leg Able to lift leg independently and hold 5-10 seconds    Total Score 26    Berg comment: 26/56, high fall risk      Timed Up and Go Test   TUG Normal TUG    Normal TUG (seconds) 40.43   with RW and CGA            TODAY'S TREATMENT:  PT evaluation   PATIENT EDUCATION: Education details: Eval findings, PT POC Person educated: Patient and Spouse Education method: Customer service manager Education comprehension: verbalized understanding, returned demonstration, verbal cues required, and needs further education   HOME EXERCISE PROGRAM: To be established next session   GOALS: Goals reviewed with patient? Yes  SHORT TERM GOALS: Target date: 10/12/2022  Pt will be independent with initial HEP for improved strength, balance, transfers and gait.  Baseline: Goal status: INITIAL  2.  Pt will improve gait velocity to at least 2.0 ft/sec for improved gait efficiency and performance at Supervision level   Baseline: 1.82 ft/sec (9/25) Goal status: INITIAL  3.  Pt will improve normal TUG to less than or equal to 30 seconds for improved functional mobility and decreased  fall risk.  Baseline: 40.43 sec (9/25) Goal status: INITIAL  4.  Pt will improve Berg score to 30/56 for decreased fall risk  Baseline: 26/56 (9/25) Goal status: INITIAL  5.  Pt will improve 5 x STS to less than or equal to 30 seconds to demonstrate improved functional strength and transfer efficiency.   Baseline: 34.62 sec (9/25) Goal status: INITIAL   LONG TERM GOALS: Target date: 11/09/2022  Pt will be independent with final HEP for improved strength, balance, transfers and gait.  Baseline:  Goal status: INITIAL  2.  Pt will improve gait velocity to at least 2.25 ft/sec for improved gait efficiency and performance at Supervision level   Baseline: 1.82 ft/sec (9/25) Goal status: INITIAL  3.  Pt will improve normal TUG to less than or equal to 20 seconds for improved functional mobility and decreased fall risk.  Baseline: 40.43 sec (9/25) Goal status: INITIAL  4.  Pt will improve Berg score to 34/56 for decreased fall risk  Baseline: 26/56 (925) Goal status: INITIAL  5.  Pt will improve 5 x STS to less than or equal to 25 seconds to demonstrate improved functional strength and transfer efficiency.   Baseline: 34.62 sec (9/25) Goal status: INITIAL   ASSESSMENT:  CLINICAL IMPRESSION: Patient is a 71 year old male referred to Neuro OPPT for CVA.   Pt's PMH is significant for: hypertension, hyperlipidemia, sinus  bradycardia/chronic CHF with mild regurgitation, BPH, history of tobacco use, vascular dementia diagnosed 2022.  The following deficits were present during the exam: decreased functional LE strength, decreased balance, decreased gait speed, decreased attention to R side and R visual field, decreased safety and independence with functional mobility. Based on his score of 26/56 on the BBS, gait speed of 1.82 ft/sec, TUG score of 40.43 sec, and 5xSTS score of 34.62 sec, pt is an increased risk for falls. Pt would benefit from skilled PT to address these impairments  and functional limitations to maximize functional mobility independence.   OBJECTIVE IMPAIRMENTS Abnormal gait, decreased balance, decreased cognition, decreased knowledge of condition, decreased knowledge of use of DME, decreased mobility, decreased strength, decreased safety awareness, and impaired vision/preception.   ACTIVITY LIMITATIONS carrying, lifting, bending, standing, squatting, stairs, and transfers  PARTICIPATION LIMITATIONS: meal prep, cleaning, laundry, medication management, interpersonal relationship, driving, shopping, community activity, and yard work  PERSONAL FACTORS 1-2 comorbidities:    PMH that includes:  hypertension, hyperlipidemia, sinus bradycardia/chronic CHF with mild regurgitation, BPH, history of tobacco use, vascular dementia diagnosed 2022 are also affecting patient's functional outcome.   REHAB POTENTIAL: Fair history of dementia and severity of R neglect/inattention  CLINICAL DECISION MAKING: Stable/uncomplicated  EVALUATION COMPLEXITY: Moderate  PLAN: PT FREQUENCY: 2x/week  PT DURATION: 8 weeks  PLANNED INTERVENTIONS: Therapeutic exercises, Therapeutic activity, Neuromuscular re-education, Balance training, Gait training, Patient/Family education, Self Care, Joint mobilization, Stair training, Vestibular training, Canalith repositioning, Visual/preceptual remediation/compensation, Orthotic/Fit training, DME instructions, Aquatic Therapy, Dry Needling, Cognitive remediation, Electrical stimulation, Wheelchair mobility training, Cryotherapy, Moist heat, Taping, Manual therapy, and Re-evaluation  PLAN FOR NEXT SESSION: initiate HEP for balance and R attention, further assess gait impairments, work on [employer], visual scanning to the R and attention to R side, safety with transfers and mobility with LRAD   Excell Seltzer, PT, DPT, CSRS 09/14/2022, 2:04 PM

## 2022-09-14 NOTE — Patient Instructions (Addendum)
   Try to print (large) 2 words to see if he can identify the correct word to a picture, object or preference  Writing choices of 2 in context is best  Pictures of family - bring them in   If you are in the market for a new tablet - consider an I PAD   3 ring binder with sections for OT/PT/ST - bring it each session  Write out address and practice this  Practice kids names using the list with cues to look right  Match names to pictures  You can also take pictures of his mower, weed eater, chain saw, and other tools

## 2022-09-14 NOTE — Telephone Encounter (Signed)
Called patient, gave results.   Blood work ordered.   Thanks!

## 2022-09-14 NOTE — Patient Instructions (Addendum)
   Coordination Activities  Perform the following activities for 15 minutes 1 times per day with right hand(s).  Flip cards 1 at a time Pick up coins and place in container or coin bank. Pick up checker/coins/dominoes, blocks and stack Screw together nuts and bolts, then unfasten. Or place bottle caps on/off  Practice fastening buttons Practice fastening zipper Fold wash cloths or small towels Sort coins or cards Sort silverwear (no sharp knives) Trace vertical or horizontal lines, circles

## 2022-09-14 NOTE — Therapy (Signed)
OUTPATIENT SPEECH LANGUAGE PATHOLOGY TREATMENT NOTE   Patient Name: Dustin Blanchard MRN: [id number] DOB:Feb 24, 1951, 71 y.o., male Today's Date: 09/14/2022  PCP[facility] [place] PROVIDER: Vaughan Blanchard, Dustin Circle, PA-C   END OF SESSION:   End of Session - 09/14/22 1147     Visit Number 2    Number of Visits 25    Date for SLP Re-Evaluation 11/30/22    Authorization Type [employer] Medicare    Progress Note Due on Visit 10    SLP Start Time 1147    SLP Stop Time  1230    SLP Time Calculation (min) 43 min    Activity Tolerance Patient tolerated treatment well             Past Medical History:  Diagnosis Date   Dementia [facility])    Past Surgical History:  Procedure Laterality Date   HERNIA REPAIR     HIP SURGERY     IR CT HEAD LTD  08/06/2022   IR CT HEAD LTD  08/06/2022   IR PERCUTANEOUS ART THROMBECTOMY/INFUSION INTRACRANIAL INC DIAG ANGIO  08/06/2022   IR US GUIDE VASC ACCESS RIGHT  08/06/2022   RADIOLOGY WITH ANESTHESIA N/A 08/06/2022   Procedure: IR WITH ANESTHESIA;  Surgeon: Dustin Bras, MD;  Location: [facility];  Service: Radiology;  Laterality: N/A;   Patient Active Problem List   Diagnosis Date Noted   Pneumonia of right lower lobe due to infectious organism    Left middle cerebral artery stroke ([place]) 08/12/2022   Sinus pause    Acute combined systolic and diastolic heart failure (HCC)    Atrial fibrillation with RVR (HCC)    Acute on chronic combined systolic and diastolic CHF (congestive heart failure) (Dustin Blanchard)    Acute ischemic left MCA stroke (Dustin Blanchard) 08/06/2022    ONSET DATE: 08/06/2022    REFERRING DIAG: I63.9 (ICD-10-CM) - CVA (cerebral vascular accident) (Dustin Blanchard)   THERAPY DIAG:  Aphasia  Oropharyngeal dysphagia  Cognitive communication deficit  Rationale for Evaluation and Treatment Rehabilitation  SUBJECTIVE: unintelligible   PAIN:  Are you having pain? No      OBJECTIVE:   TODAY'S TREATMENT:  09-14-22: Generated list of 18  family names - Dustin Blanchard repeated family names and personally relevant words (yard tools). With frequent max A Dustin Blanchard repeated in unison with ST 6 family names. With written names choice of 2 and consistent max A, Dustin Blanchard ID'd correct name choice to photo of grandson 2/3x. With choice of 2 written names, cues to look right and max A, Dustin Blanchard ID'd spouses name 2/4x. With choice of 2 written words of yard tools vs foil, Dustin Blanchard ID'd yard tool 2/4x. With this demonstration and verbal and written instruction, trained wife to provide large printed choice of 2 words to ID in context, providing example of helping him select the correct meat or drink for dinner or correct family name choice of 2 to a photo. Ongoing instruction required to ascertain if Dustin Blanchard can make choice of 2 words for AAC. [facility] repeated asked [facility] if he was cold. Initiated training of gestures for cold, hot, thirsty with Dustin Blanchard imitating my gesture 2/5 attempts for each. Instructed Dustin Blanchard to have her son help her get photos of family and Dustin Blanchard's tools on her phone for naming tasks. Automatic speech: Dustin Blanchard completed 8/10 idioms with usual repetition of phrase. Frequent ongoing cues to keep head up, look at me and look right to ID written words     SLP advised pt and spouse of  observations and recommendations for implementation of ST services. Collaborated to generate goals and form plan of care. Pt and spouse are in agreement with proposed POC, deny any questions at conclusion of session.    PATIENT EDUCATION: Education details: see above Person educated: Patient and Spouse Education method: Explanation, Demonstration, and Handouts Education comprehension: verbalized understanding, returned demonstration, and needs further education   GOALS: Goals reviewed with patient? Yes   SHORT TERM GOALS: Target date: 10/12/2022   Pt will average 80% accuracy in practical naming tasks with usual mod-A over 2 sessions.  Baseline: Goal status: INITIAL   2.  Spouse will  teach back supportive communication strategies to aid in auditory comprehension at home with supervision A.   Baseline:  Goal status: INITIAL   3.  Pt will comprehend  written choice f:3 at word level with known context 80% to make accurate selections of preferences, answer questions at home with occasional min A Baseline:  Goal status: INITIAL   4.  Pt will be intelligible in 2 word phrase production in 15/20 sentences with direct model and usual mod-A  Baseline:  Goal status: INITIAL   5.  Pt will complete augmentative communication/SGD canidacy assessment to determine appropriate communication device to support communication of wants/needs/thoughts Baseline:  Goal status: INITIAL     LONG TERM GOALS: Target date: 11/30/2022    Pt will use AAC/multimodal communicate to communicate choices, wants at home with family and caregiver over 1 week Baseline:  Goal status: INITIAL   2.  Pt will name 5 items in personally relevant categories with occasional mod A over 2 sessions Baseline:  Goal status: INITIAL   3.  Pt will employ dysarthria strategies resulting in 50% intelligibility in self-generated words and phrases with usual mod-A Baseline:  Goal status: INITIAL   4.  Pt will participate in clinical swallow evaluation to assess for candidacy for upgraded diet textures and liquid consistencies PRN Baseline:  Goal status: INITIAL     ASSESSMENT:   CLINICAL IMPRESSION: Patient is a 71 y.o. M who was seen today for cognitive linguistic and dysphagia evaluation following CVA. Pt presenting with severe non-fluent aphasia, moderate dysarthria, oropharyngeal dysphagia, and cognitive deficits in areas of attention, memory, executive functioning.  Significant expressive > receptive language deficits impacting overall communication abilities. Pt spouse reporting usual frustration at home, though are employing yes/no questioning to determine wants/needs. Pt evidences frustration with  communication limitations this date: crying but insists on persisting through evaluation. Notable difficulties in communicating beyond answer y/n questions, unable to provide substantive response to any open ended question or choose from written or spoken FO2 or FO4. 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 Dysarthria characterized by reduced vocal intensity and imprecise articulation which results in intelligibility reduced to ~50% at single word level. Per MBSS, pt with oropharyngeal dysphagia, currently on nectar thickened liquids with purees at home. Dysphagia exercises or strategies are contraindicated at this time d/t difficulty evidenced with following directions and impaired receptive language and memory. Plan to monitor pt's diet tolerance, complete subsequent clinical swallow evaluation, and adjust dysphagia intervention or recommendations based on pt progress. Decreased working memory and attention evidenced throughout evaluation, requiring SLP to redirect pt with verbal cues. Severity of cognitive impairments difficult to assess 2/2 communication impairment. Pt benefiting from usual repetition, use of simple and direct language, visual and verbal cueing for participation in assessment. SLP recommends skilled ST intervention course to facilitate improved communication efficacy, reduce caregiver burden, and improve QoL.  OBJECTIVE IMPAIRMENTS include attention, memory, executive functioning, aphasia, dysarthria, and dysphagia. These impairments are limiting patient from ADLs/IADLs, effectively communicating at home and in community, and safety when swallowing. Factors affecting potential to achieve goals and functional outcome are ability to learn/carryover information, co-morbidities, previous level of function, and financial resources. Patient will benefit from skilled SLP services to address above impairments and improve overall function.   REHAB POTENTIAL: Fair  co-morbidities   PLAN: SLP FREQUENCY: 2x/week   SLP DURATION: 12 weeks   PLANNED INTERVENTIONS: Aspiration precaution training, Pharyngeal strengthening exercises, Diet toleration management , Language facilitation, Trials of upgraded texture/liquids, Cueing hierachy, Cognitive reorganization, Internal/external aids, Oral motor exercises, Functional tasks, Multimodal communication approach, SLP instruction and feedback, Compensatory strategies, Patient/family education, and Re-evaluation         (Copy Today's treatment to Plan section here)   Kassi Esteve, Annye Rusk, [place] 09/14/2022, 12:53 PM

## 2022-09-14 NOTE — Telephone Encounter (Signed)
   [name] calling to get lab result. She said, if not one answered the home phone, call her cell# [phone]

## 2022-09-15 ENCOUNTER — Inpatient Hospital Stay: Payer: Medicare HMO | Admitting: Adult Health

## 2022-09-16 NOTE — Therapy (Signed)
OUTPATIENT OCCUPATIONAL THERAPY NEURO EVALUATION  Patient Name: Dustin Blanchard MRN: [id number] DOB:02-03-1951, 71 y.o., male Today's Date: 09/17/2022  PCP: [facility] PROVIDER: Dr. Leeroy Cha   OT End of Session - 09/17/22 1240     Visit Number 3    Number of Visits 25    Date for OT Re-Evaluation 12/06/22    Authorization Type [employer] insurance verifications    Authorization - Visit Number 3    Authorization - Number of Visits 10    Progress Note Due on Visit 10    OT Start Time [id number]    OT Stop Time 1315    OT Time Calculation (min) 39 min    Activity Tolerance Patient tolerated treatment well    Behavior During Therapy [place] for tasks assessed/performed;Flat affect               Past Medical History:  Diagnosis Date   Dementia [facility])    Past Surgical History:  Procedure Laterality Date   HERNIA REPAIR     HIP SURGERY     IR CT HEAD LTD  08/06/2022   IR CT HEAD LTD  08/06/2022   IR PERCUTANEOUS ART THROMBECTOMY/INFUSION INTRACRANIAL INC DIAG ANGIO  08/06/2022   IR US GUIDE VASC ACCESS RIGHT  08/06/2022   RADIOLOGY WITH ANESTHESIA N/A 08/06/2022   Procedure: IR WITH ANESTHESIA;  Surgeon: Luanne Bras, MD;  Location: [place];  Service: Radiology;  Laterality: N/A;   Patient Active Problem List   Diagnosis Date Noted   Pneumonia of right lower lobe due to infectious organism    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   Sinus pause    Acute combined systolic and diastolic heart failure (HCC)    Atrial fibrillation with RVR (HCC)    Acute on chronic combined systolic and diastolic CHF (congestive heart failure) ([place])    Acute ischemic left MCA stroke ([place]) 08/06/2022    ONSET DATE: 08/06/22  REFERRING DIAG: I63.9 (ICD-10-CM) - CVA (cerebral vascular accident) ([place])   THERAPY DIAG:  Other symptoms and signs involving cognitive functions following cerebral infarction  Frontal lobe and executive function  deficit  Attention and concentration deficit  Visuospatial deficit  Hemiplegia and hemiparesis following cerebral infarction affecting right dominant side (HCC)  Other lack of coordination  Rationale for Evaluation and Treatment Rehabilitation  SUBJECTIVE:   SUBJECTIVE STATEMENT: Tried sorting coins at home per wife, but difficult.  Pt did attempt to do some vocalizations (affirmative) today and continues to try to shake head/nod.  Pt accompanied by: significant other    PERTINENT HISTORY: Presented to hospital 08/06/2022 with acute onset of right-sided weakness and aphasia. Cranial CT scan showed small age-indeterminate left PCA infarct of the left occipital lobe.  Patient did receive TNK as well as endovascular revascularization of occlusion of left M3 MCA middle division branch 08/06/2022 per interventional radiology. In hospital, cardiology service consulted for new A-fib RVR/CHF.    PMH:  hypertension hyperlipidemia sinus bradycardia/chronic CHF with mild regurgitation BPH history of tobacco use vascular dementia diagnosed 2022  PRECAUTIONS: Fall and Other: aphasia (particularly expressive), Dysphagia #1 nectar thick liquids   WEIGHT BEARING RESTRICTIONS No  PAIN:  Are you having pain? No  FALLS: Has patient fallen in last 6 months? Yes. Number of falls morning of CVA  LIVING ENVIRONMENT: Lives with: lives with their spouse Lives in: House/apartment Stairs: Yes: External: 4 steps; bilateral Has following equipment at home: [place]ford Rile - 2 wheeled, Shower bench, bed side commode,  and transport w/c, handheld shower head  PLOF: Independent, Vocation/Vocational requirements: machine shop, small-scale farming, and Leisure: gardening, repairs around the house   PATIENT GOALS be more independent, use RUE more, attend to R side more.  OBJECTIVE:   HAND DOMINANCE: Right   TODAY'S TREATMENT:   Placing various-sized pegs in arc pegboard by color with mod cueing, particularly with  white initially.  Pt seemed to demo incr difficulty with different sized pegs of the same color and needed min-mod cueing to scan to the R side.    Sorting coins to place in bowls (pennies vs. Silver coins) with mod-max assist for attention to R side.  Utilized visual, verbal, tactile, and auditory cues to incr head turns/attention to R side.  Pt did not put any coins in "silver" bowl until bowl was moved to L side.  Simple card matching of animals:  Pt given 1 card, shown on R side to match cards at midline (4 in simple array).  Pt with mod-max cueing for visual scanning (verbal, tactile, auditory, and visual).  Pt needed mod cueing/facilitation and CGA for simple functional environmental navigation and safety with RW to navigate RW to turn to sit, avoid objects on R side.    Pt needed min-mod A for hand placement for hand sanitizer/pump.    Pt positioned on R side today to encourage head turn/attention to R side.    PATIENT EDUCATION: Education details: Use of tactile, auditory sound, and visual cueing or simple verbal cueing for improved performance; recommended wife sit on R side of pt and place objects on R side to encourage incr attention and visual scanning to the R side.    Person educated: Patient and Spouse Education method: Explanation, Demonstration, Tactile cues, and Verbal cues Education comprehension: verbalized understanding, returned demonstration, verbal cues required, tactile cues required, and needs further education   HOME EXERCISE PROGRAM: 09/14/22:  RUE and bilateral hand functional use HEP     GOALS: Potential Goals reviewed with patient? Yes  SHORT TERM GOALS: Target date: 10/06/22  Pt/caregiver will be independent with HEP for RUE functional use/coordination.   Goal status: INITIAL  2.  Pt/caregiver will verbalize understanding of visual compensation strategies and visual HEP. Goal status: INITIAL  3.  Pt will perform simple tabletop visual scanning with  at least 85% accuracy. Goal status: INITIAL  4.  Pt will improve R grip strength to at least 25lbs for incr functional use. Baseline:  17.1lbs Goal status: INITIAL  5.  Pt will be able to consistently perform bilateral UE tasks with no more than min cueing (such as zipping jacket, using pump of hand sanitizer, folding towels, etc). Goal status: INITIAL    LONG TERM GOALS: Target date: 12/06/22  Pt/caregiver will be independent with HEP for RUE functional use/strength.  Goal status: INITIAL  2.  Pt/caregiver will verbalize understanding of strategies to incr safety/ease/participation with ADLs/IADLs. Goal status: INITIAL  3.  Pt will perform simple environmental scanning with at least 85% accuracy for incr safety. Goal status: INITIAL  4.  Pt will improve RUE coordination/functional use and cognition as shown by improving score on box and blocks to 15 with RUE. Baseline: 5 blocks Goal status: INITIAL  5.  Pt will use RUE as dominant UE for functional tasks at least 90% of the time. Goal status: INITIAL  6.  Pt will be able to perform BADLs with no more than distant supervision. Goal status: INITIAL  ASSESSMENT:  CLINICAL IMPRESSION: Pt demo improve RUE  functional use with repetition and verbal/tactile/demo/visual cueing, but pt needs significant cueing for attention to the R side/head turns to the R.      PERFORMANCE DEFICITS in functional skills including ADLs, IADLs, coordination, dexterity, proprioception, strength, FMC, GMC, mobility, balance, decreased knowledge of precautions, decreased knowledge of use of DME, vision, and UE functional use, cognitive skills including attention, learn, memory, perception, problem solving, safety awareness, and understand, and psychosocial skills including environmental adaptation, habits, and routines and behaviors.   IMPAIRMENTS are limiting patient from ADLs, IADLs, and leisure.   COMORBIDITIES has co-morbidities such as vascular  dementia  that affects occupational performance. Patient will benefit from skilled OT to address above impairments and improve overall function.  MODIFICATION OR ASSISTANCE TO COMPLETE EVALUATION: Min-Moderate modification of tasks or assist with assess necessary to complete an evaluation.  OT OCCUPATIONAL PROFILE AND HISTORY: Detailed assessment: Review of records and additional review of physical, cognitive, psychosocial history related to current functional performance.  CLINICAL DECISION MAKING: Moderate - several treatment options, min-mod task modification necessary  REHAB POTENTIAL: Good  EVALUATION COMPLEXITY: Moderate    PLAN: OT FREQUENCY: 2x/week  OT DURATION: 12 weeks +eval  PLANNED INTERVENTIONS: self care/ADL training, therapeutic exercise, therapeutic activity, neuromuscular re-education, passive range of motion, balance training, functional mobility training, fluidotherapy, moist heat, cryotherapy, patient/family education, cognitive remediation/compensation, visual/perceptual remediation/compensation, energy conservation, coping strategies training, and DME and/or AE instructions  RECOMMENDED OTHER SERVICES: current with Speech therapy, needs to schedule PT (referral in Epic)  CONSULTED AND AGREED WITH PLAN OF CARE: Patient and family member/caregiver  PLAN FOR NEXT SESSION:  review HEP prn, RUE functional use, simple visual scanning   Darcus Edds, OTR/L 09/17/2022, 2:16 PM

## 2022-09-16 NOTE — Progress Notes (Unsigned)
[facility] [address] [place]. [place] [address] [phone]       HOSPITAL FOLLOW UP NOTE  [name] Date of Birth:  1951/08/01 Medical Record Number:  [id number]   Reason for Referral:  hospital stroke follow up    SUBJECTIVE:   CHIEF COMPLAINT:  No chief complaint on file.   HPI:   Dustin Blanchard is a 71 y.o. male with a past medical history significant for hypertension, hyperlipidemia, sinus bradycardia, BPH who presented to outside hospital after being found down at home on 08/06/2022.  He was found to have an acute left MCA stroke.  He was given TNK and transferred to [facility] for mechanical thrombectomy of left M2 occlusion, which was successful.  Punctate right ACA infarct was also seen on MRI.  Etiology likely due to newly diagnosed A-fib and was stared on Eliquis.  EF 35 to 40% with severe LA dilation.  LDL 71 -increase atorvastatin from 20 mg to 40 mg daily.  A1c 4.6.  Other stroke risk factors include advanced age and CHF.  No prior stroke history.  Hospital course complicated by cognitive impairment at baseline with delirium/sundowning, was placed on Seroquel and Depakote and advised to taper off meds once able.  He was transferred to [facility] for ongoing therapy needs.   Today, 09/17/2022, patient being seen for hospital follow up.       PERTINENT IMAGING  CT showed no acute abnormality, old left occipital pole infarct.  Status post TNK.   CTA head and neck showed left M3 occlusion, bilateral siphon atherosclerosis with left moderate stenosis, left V4 stenosis.   CTP 0/123.   Status post IR with left M3 TICI2c reperfusion.   Repeat CT showed left frontal infarct without significant hemorrhage.   MRI showed left MCA patchy infarct with mild hemorrhagic conversion and a punctate right ACA infarct.   EF 35 to 40%. Severe LA dilation, LDL 71 A1c 4.6  UDS negative.     ROS:   14 system review of systems performed and negative with exception  of ***  PMH:  Past Medical History:  Diagnosis Date   Dementia ([place])     PSH:  Past Surgical History:  Procedure Laterality Date   HERNIA REPAIR     HIP SURGERY     IR CT HEAD LTD  08/06/2022   IR CT HEAD LTD  08/06/2022   IR PERCUTANEOUS ART THROMBECTOMY/INFUSION INTRACRANIAL INC DIAG ANGIO  08/06/2022   IR US GUIDE VASC ACCESS RIGHT  08/06/2022   RADIOLOGY WITH ANESTHESIA N/A 08/06/2022   Procedure: IR WITH ANESTHESIA;  Surgeon: Luanne Bras, MD;  Location: [place];  Service: Radiology;  Laterality: N/A;    Social History:  Social History   Socioeconomic History   Marital status: Married    Spouse name: Not on file   Number of children: Not on file   Years of education: Not on file   Highest education level: Not on file  Occupational History   Not on file  Tobacco Use   Smoking status: Former   Smokeless tobacco: Never  Vaping Use   Vaping Use: Never used  Substance and Sexual Activity   Alcohol use: No   Drug use: Not Currently   Sexual activity: Never  Other Topics Concern   Not on file  Social History Narrative   Not on file   Social Determinants of Health   Financial Resource Strain: Not on file  Food Insecurity: Not on file  Transportation  Needs: Not on file  Physical Activity: Not on file  Stress: Not on file  Social Connections: Not on file  Intimate Partner Violence: Not on file    Family History: No family history on file.  Medications:   Current Outpatient Medications on File Prior to Visit  Medication Sig Dispense Refill   acetaminophen (TYLENOL) 325 MG tablet Take 2 tablets (650 mg total) by mouth every 4 (four) hours as needed for mild pain (or temp > 37.5 C (99.5 F)).     amiodarone (PACERONE) 200 MG tablet Take 1 tablet (200 mg total) by mouth daily. 30 tablet 4   apixaban (ELIQUIS) 5 MG TABS tablet Take 1 tablet (5 mg total) by mouth 2 (two) times daily. 60 tablet 4   atorvastatin (LIPITOR) 40 MG tablet Take 1 tablet (40 mg total) by  mouth daily. 30 tablet 0   B Complex-C (B-COMPLEX WITH VITAMIN C) tablet Take 1 tablet by mouth daily. 30 tablet 0   cholecalciferol (VITAMIN D3) 10 MCG (400 UNIT) TABS tablet Take 2 tablets (800 Units total) by mouth daily. 60 tablet 0   magnesium gluconate (MAGONATE) 500 MG tablet Take 0.5 tablets (250 mg total) by mouth at bedtime. 30 tablet 0   melatonin 3 MG TABS tablet Take 1 tablet (3 mg total) by mouth at bedtime. 30 tablet 0   mirtazapine (REMERON) 7.5 MG tablet Take 1 tablet (7.5 mg total) by mouth at bedtime. 30 tablet 0   Multiple Vitamin (MULTIVITAMIN WITH MINERALS) TABS tablet Take 1 tablet by mouth daily.     pantoprazole sodium (PROTONIX) 40 mg Take 40 mg by mouth at bedtime. 30 packet 0   polyethylene glycol (MIRALAX / GLYCOLAX) 17 g packet Take 17 g by mouth daily. 14 each 0   QUEtiapine (SEROQUEL) 25 MG tablet Take 1 tablet (25 mg total) by mouth daily with supper. 30 tablet 0   spironolactone (ALDACTONE) 25 MG tablet Take 1 tablet (25 mg total) by mouth daily. 30 tablet 4   tamsulosin (FLOMAX) 0.4 MG CAPS capsule Take 1 capsule (0.4 mg total) by mouth daily. 30 capsule 0   valproic acid (DEPAKENE) 250 MG/5ML solution Take 5 mLs (250 mg total) by mouth at bedtime. 300 mL 0   No current facility-administered medications on file prior to visit.    Allergies:  No Known Allergies    OBJECTIVE:  Physical Exam  There were no vitals filed for this visit. There is no height or weight on file to calculate BMI. No results found.      No data to display           General: well developed, well nourished, seated, in no evident distress Head: head normocephalic and atraumatic.   Neck: supple with no carotid or supraclavicular bruits Cardiovascular: regular rate and rhythm, no murmurs Musculoskeletal: no deformity Skin:  no rash/petichiae Vascular:  Normal pulses all extremities   Neurologic Exam Mental Status: Awake and fully alert. Oriented to place and time. Recent  and remote memory intact. Attention span, concentration and fund of knowledge appropriate. Mood and affect appropriate.  Cranial Nerves: Fundoscopic exam reveals sharp disc margins. Pupils equal, briskly reactive to light. Extraocular movements full without nystagmus. Visual fields full to confrontation. Hearing intact. Facial sensation intact. Face, tongue, palate moves normally and symmetrically.  Motor: Normal bulk and tone. Normal strength in all tested extremity muscles Sensory.: intact to touch , pinprick , position and vibratory sensation.  Coordination: Rapid alternating movements normal in  all extremities. Finger-to-nose and heel-to-shin performed accurately bilaterally. Gait and Station: Arises from chair without difficulty. Stance is normal. Gait demonstrates normal stride length and balance with ***. Tandem walk and heel toe ***.  Reflexes: 1+ and symmetric. Toes downgoing.     NIHSS  *** Modified Rankin  ***      ASSESSMENT: Dustin Blanchard is a 71 y.o. year old male left MCA patchy infarct and punctate right ACA infarct due to left M3 occlusion on 08/06/2022 s/p TNK and IR with TICI 2C reperfusion, embolic source likely due to newly diagnosed A-fib. Vascular risk factors include HTN, HLD, new dx of A fib, CHF, tachybradycardia syndrome, cognitive impairment at baseline and CHF.      PLAN:  Embolic stroke:  Residual deficit: ***.  Continue Eliquis 5mg  twice daily and atorvastatin (Lipitor) 40 mg daily for secondary stroke prevention.   Discussed secondary stroke prevention measures and importance of close PCP follow up for aggressive stroke risk factor management including BP goal<130/90, HLD with LDL goal<70 and DM with A1c.<7 .  Stroke labs 07/2022: LDL 71, A1c 4.6 I have gone over the pathophysiology of stroke, warning signs and symptoms, risk factors and their management in some detail with instructions to go to the closest emergency room for symptoms of  concern.     Follow up in *** or call earlier if needed   CC:  [facility] provider: Dr. Leonie Man PCP: [facility]    I spent *** minutes of face-to-face and non-face-to-face time with patient.  This included previsit chart review including review of recent hospitalization, lab review, study review, order entry, electronic health record documentation, patient education regarding recent stroke including etiology, secondary stroke prevention measures and importance of managing stroke risk factors, residual deficits and typical recovery time and answered all other questions to patient satisfaction   Frann Rider, AGNP-BC  [facility] [address] [place], [place] [address]  Phone [phone] Fax [phone] Note: This document was prepared with digital dictation and possible smart phrase technology. Any transcriptional errors that result from this process are unintentional.

## 2022-09-17 ENCOUNTER — Encounter: Payer: Self-pay | Admitting: Speech Pathology

## 2022-09-17 ENCOUNTER — Ambulatory Visit: Payer: Medicare HMO | Admitting: Adult Health

## 2022-09-17 ENCOUNTER — Encounter: Payer: Self-pay | Admitting: Adult Health

## 2022-09-17 ENCOUNTER — Ambulatory Visit: Payer: Medicare HMO | Admitting: Physical Therapy

## 2022-09-17 ENCOUNTER — Ambulatory Visit: Payer: Medicare HMO | Admitting: Speech Pathology

## 2022-09-17 ENCOUNTER — Encounter: Payer: Self-pay | Admitting: Occupational Therapy

## 2022-09-17 ENCOUNTER — Ambulatory Visit: Payer: Medicare HMO | Admitting: Occupational Therapy

## 2022-09-17 VITALS — BP 116/62 | HR 44 | Ht 72.0 in | Wt 134.4 lb

## 2022-09-17 DIAGNOSIS — R278 Other lack of coordination: Secondary | ICD-10-CM

## 2022-09-17 DIAGNOSIS — Z09 Encounter for follow-up examination after completed treatment for conditions other than malignant neoplasm: Secondary | ICD-10-CM

## 2022-09-17 DIAGNOSIS — R41842 Visuospatial deficit: Secondary | ICD-10-CM

## 2022-09-17 DIAGNOSIS — R2681 Unsteadiness on feet: Secondary | ICD-10-CM

## 2022-09-17 DIAGNOSIS — R4701 Aphasia: Secondary | ICD-10-CM

## 2022-09-17 DIAGNOSIS — I63421 Cerebral infarction due to embolism of right anterior cerebral artery: Secondary | ICD-10-CM

## 2022-09-17 DIAGNOSIS — I480 Paroxysmal atrial fibrillation: Secondary | ICD-10-CM | POA: Diagnosis not present

## 2022-09-17 DIAGNOSIS — I63512 Cerebral infarction due to unspecified occlusion or stenosis of left middle cerebral artery: Secondary | ICD-10-CM | POA: Diagnosis not present

## 2022-09-17 DIAGNOSIS — R41841 Cognitive communication deficit: Secondary | ICD-10-CM

## 2022-09-17 DIAGNOSIS — I69318 Other symptoms and signs involving cognitive functions following cerebral infarction: Secondary | ICD-10-CM

## 2022-09-17 DIAGNOSIS — R41844 Frontal lobe and executive function deficit: Secondary | ICD-10-CM

## 2022-09-17 DIAGNOSIS — R41840 Attention and concentration deficit: Secondary | ICD-10-CM

## 2022-09-17 DIAGNOSIS — I69351 Hemiplegia and hemiparesis following cerebral infarction affecting right dominant side: Secondary | ICD-10-CM

## 2022-09-17 DIAGNOSIS — R1312 Dysphagia, oropharyngeal phase: Secondary | ICD-10-CM

## 2022-09-17 DIAGNOSIS — R2689 Other abnormalities of gait and mobility: Secondary | ICD-10-CM

## 2022-09-17 MED ORDER — VALPROIC ACID 250 MG/5ML PO SOLN: 250.0000 mg | Freq: Every day | ORAL | 5 refills | Status: DC

## 2022-09-17 MED ORDER — QUETIAPINE FUMARATE 25 MG PO TABS: 25.0000 mg | ORAL_TABLET | Freq: Every day | ORAL | 11 refills | Status: DC

## 2022-09-17 NOTE — Therapy (Signed)
OUTPATIENT PHYSICAL THERAPY NEURO EVALUATION   Patient Name: Dustin Blanchard MRN: [id number] DOB:1951/04/07, 71 y.o., male Today's Date: 09/17/2022   PCP: Dr. Loletha Grayer at [facility] PROVIDER: Barbie Banner, PA-C    PT End of Session - 09/17/22 1409     Visit Number 2    Number of Visits 17   with eval   Date for PT Re-Evaluation 11/23/22   to allow for scheduling delays   Authorization Type [employer] Medicare    Progress Note Due on Visit 10    PT Start Time 1405   from speech therapy   PT Stop Time 1443    PT Time Calculation (min) 38 min    Equipment Utilized During Treatment Gait belt    Activity Tolerance Patient tolerated treatment well    Behavior During Therapy [facility] for tasks assessed/performed;Flat affect             Past Medical History:  Diagnosis Date   Dementia [facility])    Past Surgical History:  Procedure Laterality Date   HERNIA REPAIR     HIP SURGERY     IR CT HEAD LTD  08/06/2022   IR CT HEAD LTD  08/06/2022   IR PERCUTANEOUS ART THROMBECTOMY/INFUSION INTRACRANIAL INC DIAG ANGIO  08/06/2022   IR US GUIDE VASC ACCESS RIGHT  08/06/2022   RADIOLOGY WITH ANESTHESIA N/A 08/06/2022   Procedure: IR WITH ANESTHESIA;  Surgeon: Luanne Bras, MD;  Location: [place];  Service: Radiology;  Laterality: N/A;   Patient Active Problem List   Diagnosis Date Noted   Pneumonia of right lower lobe due to infectious organism    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   Sinus pause    Acute combined systolic and diastolic heart failure (HCC)    Atrial fibrillation with RVR (HCC)    Acute on chronic combined systolic and diastolic CHF (congestive heart failure) ([place])    Acute ischemic left MCA stroke ([place]) 08/06/2022    ONSET DATE: 08/26/2022   REFERRING DIAG: I63.9 (ICD-10-CM) - CVA (cerebral vascular accident) ([place])   THERAPY DIAG:  Unsteadiness on feet  Other abnormalities of gait and mobility  Other lack of  coordination  Rationale for Evaluation and Treatment Rehabilitation  SUBJECTIVE:                                                                                                                                                                                              SUBJECTIVE STATEMENT: Pt and his wife with no questions after initial evaluation. They report no falls or near falls since last session, no pain this  date.  Pt accompanied by: significant other wife [name]  PERTINENT HISTORY: hypertension, hyperlipidemia, sinus bradycardia/chronic CHF with mild regurgitation, BPH, history of tobacco use, vascular dementia diagnosed 2022   PAIN:  Are you having pain? No  PRECAUTIONS: Fall and Other: aphasia, dysphagia 1 (nectar-thick)  WEIGHT BEARING RESTRICTIONS No  FALLS: Has patient fallen in last 6 months? Yes. Number of falls one fall when he had the stroke, no other falls  LIVING ENVIRONMENT: Lives with: lives with their spouse Lives in: House/apartment Stairs: Yes: External: 4 steps; can reach both Has following equipment at home: Walker - 2 wheeled and transport chair  PLOF: Needs assistance with gait, Needs assistance with transfers, and uses RW in the home with Supervision, uses transport chair for community mobility  PATIENT GOALS "to get him back to moving and walking better"  OBJECTIVE:   TODAY'S TREATMENT:  GAIT: Gait pattern: trunk flexed and wide BOS, decreased attention to R Distance walked: 115 ft Assistive device utilized: Environmental consultant - 2 wheeled Level of assistance: CGA Comments: pt needs max cues to find his seat on R side and to turn safely to sit back down after ambulation  Gait pattern:  anterior lean, trunk flexed, and wide BOSflexed trunk, anterior lean Distance walked: 115 ft Assistive device utilized: None Level of assistance: CGA Comments: small decrease in balance/stability without use of AD, ongoing severe R neglect with max cues needed for  safety  THER ACT: Tried static standing balance EOM with modified tandem stance performing horizontal head turns, difficulty following directions to perform task correctly  Tried static standing EOM performing visual scanning to the R to retrieve cones with R hand, pt again with difficulty following cues and directions to perform task correctly, needs max cues to turn head to the R to scan environment for cone.  Sidesteps L/R at counter top, 2 x 10 ft with BUE support Tandem gait at counter top, 2 x 10 ft with one UE support Standing alt L/R 4" step-taps with no UE support and CGA for balance, x 10 reps *added to HEP, see bolded below  Educated pt and wife on pt's R neglect and importance of keeping cues/directions to one-step and as simple as possible due to pt's aphasia and/or motor planning deficits  PATIENT EDUCATION: Education details: R neglect, one-step simple commands, initiated HEP Person educated: Patient and Spouse Education method: Explanation, Demonstration, and Handouts Education comprehension: verbalized understanding, returned demonstration, verbal cues required, and needs further education   HOME EXERCISE PROGRAM: Access Code: [email or web address] URL: [email or web address] Date: 09/17/2022 Prepared by: Excell Seltzer  Exercises - Side Stepping with Counter Support  - 1 x daily - 7 x weekly - 1 sets - 5 reps - Tandem Walking with Counter Support  - 1 x daily - 7 x weekly - 1 sets - 5 reps - Alternating Step Taps with Counter Support  - 1 x daily - 7 x weekly - 2 sets - 10 reps   GOALS: Goals reviewed with patient? Yes  SHORT TERM GOALS: Target date: 10/12/2022  Pt will be independent with initial HEP for improved strength, balance, transfers and gait.  Baseline: Goal status: INITIAL  2.  Pt will improve gait velocity to at least 2.0 ft/sec for improved gait efficiency and performance at Supervision level   Baseline: 1.82 ft/sec (9/25) Goal status:  INITIAL  3.  Pt will improve normal TUG to less than or equal to 30 seconds for improved functional mobility and decreased fall risk.  Baseline: 40.43  sec (9/25) Goal status: INITIAL  4.  Pt will improve Berg score to 30/56 for decreased fall risk  Baseline: 26/56 (9/25) Goal status: INITIAL  5.  Pt will improve 5 x STS to less than or equal to 30 seconds to demonstrate improved functional strength and transfer efficiency.   Baseline: 34.62 sec (9/25) Goal status: INITIAL   LONG TERM GOALS: Target date: 11/09/2022  Pt will be independent with final HEP for improved strength, balance, transfers and gait.  Baseline:  Goal status: INITIAL  2.  Pt will improve gait velocity to at least 2.25 ft/sec for improved gait efficiency and performance at Supervision level   Baseline: 1.82 ft/sec (9/25) Goal status: INITIAL  3.  Pt will improve normal TUG to less than or equal to 20 seconds for improved functional mobility and decreased fall risk.  Baseline: 40.43 sec (9/25) Goal status: INITIAL  4.  Pt will improve Berg score to 34/56 for decreased fall risk  Baseline: 26/56 (925) Goal status: INITIAL  5.  Pt will improve 5 x STS to less than or equal to 25 seconds to demonstrate improved functional strength and transfer efficiency.   Baseline: 34.62 sec (9/25) Goal status: INITIAL   ASSESSMENT:  CLINICAL IMPRESSION: Emphasis of skilled PT session on initiating HEP for standing balance, assessing gait with and without AD, and on attempting to work on visual scanning to the R well as educating pt and his wife on his current deficits and how to address these safely at home. Pt exhibits minor decrease in overall balance and stability during gait without use of AD vs use of RW. Pt does continue to exhibit significant R neglect impacting his ability to safely navigate his environment and perform transfers. Pt also exhibits receptive aphasia and/or motor planning deficits which impair  his ability to follow cues and directions to engage in functional therapy tasks. Pt continues to benefit from skilled therapy services to address above-mentioned deficits. Continue POC.    OBJECTIVE IMPAIRMENTS Abnormal gait, decreased balance, decreased cognition, decreased knowledge of condition, decreased knowledge of use of DME, decreased mobility, decreased strength, decreased safety awareness, and impaired vision/preception.   ACTIVITY LIMITATIONS carrying, lifting, bending, standing, squatting, stairs, and transfers  PARTICIPATION LIMITATIONS: meal prep, cleaning, laundry, medication management, interpersonal relationship, driving, shopping, community activity, and yard work  PERSONAL FACTORS 1-2 comorbidities:    PMH that includes:  hypertension, hyperlipidemia, sinus bradycardia/chronic CHF with mild regurgitation, BPH, history of tobacco use, vascular dementia diagnosed 2022 are also affecting patient's functional outcome.   REHAB POTENTIAL: Fair history of dementia and severity of R neglect/inattention  CLINICAL DECISION MAKING: Stable/uncomplicated  EVALUATION COMPLEXITY: Moderate  PLAN: PT FREQUENCY: 2x/week  PT DURATION: 8 weeks  PLANNED INTERVENTIONS: Therapeutic exercises, Therapeutic activity, Neuromuscular re-education, Balance training, Gait training, Patient/Family education, Self Care, Joint mobilization, Stair training, Vestibular training, Canalith repositioning, Visual/preceptual remediation/compensation, Orthotic/Fit training, DME instructions, Aquatic Therapy, Dry Needling, Cognitive remediation, Electrical stimulation, Wheelchair mobility training, Cryotherapy, Moist heat, Taping, Manual therapy, and Re-evaluation  PLAN FOR NEXT SESSION: add to HEP for balance and R attention, work on [employer], visual scanning to the R and attention to R side, safety with transfers and mobility with LRAD   Excell Seltzer, PT, DPT, CSRS 09/17/2022, 2:44 PM

## 2022-09-17 NOTE — Patient Instructions (Signed)
Continue working with therapies for hopeful ongoing recovery  Continue Depakene and Seroquel for now - can consider gradually reducing dose at follow up visit  Continue  Eliquis   and atorvastatin  for secondary stroke prevention  Continue to follow up with PCP regarding cholesterol and blood pressure management  Maintain strict control of hypertension with blood pressure goal below 130/90 and cholesterol with LDL cholesterol (bad cholesterol) goal below 70 mg/dL.   Signs of a Stroke? Follow the BEFAST method:  Balance Watch for a sudden loss of balance, trouble with coordination or vertigo Eyes Is there a sudden loss of vision in one or both eyes? Or double vision?  Face: Ask the person to smile. Does one side of the face droop or is it numb?  Arms: Ask the person to raise both arms. Does one arm drift downward? Is there weakness or numbness of a leg? Speech: Ask the person to repeat a simple phrase. Does the speech sound slurred/strange? Is the person confused ? Time: If you observe any of these signs, call 911.     Followup in the future with me in 4 months or call earlier if needed       Thank you for coming to see [place] at [facility]. I hope we have been able to provide you high quality care today.  You may receive a patient satisfaction survey over the next few weeks. We would appreciate your feedback and comments so that we may continue to improve ourselves and the health of our patients.

## 2022-09-17 NOTE — Therapy (Signed)
OUTPATIENT SPEECH LANGUAGE PATHOLOGY TREATMENT NOTE   Patient Name: Dustin Blanchard MRN: [id number] DOB:[birth year], 71 y.o., male Today's Date: 09/17/2022  PCP: [facility] [place] PROVIDER: Vaughan Basta, [place] Circle, PA-C   END OF SESSION:   End of Session - 09/17/22 1325     Visit Number 3    Number of Visits 25    Date for SLP Re-Evaluation 11/30/22    Authorization Type Aetna Medicare    Progress Note Due on Visit 10    SLP Start Time 1320    SLP Stop Time  1400    SLP Time Calculation (min) 40 min    Activity Tolerance Patient tolerated treatment well             Past Medical History:  Diagnosis Date   Dementia [facility])    Past Surgical History:  Procedure Laterality Date   HERNIA REPAIR     HIP SURGERY     IR CT HEAD LTD  08/06/2022   IR CT HEAD LTD  08/06/2022   IR PERCUTANEOUS ART THROMBECTOMY/INFUSION INTRACRANIAL INC DIAG ANGIO  08/06/2022   IR US GUIDE VASC ACCESS RIGHT  08/06/2022   RADIOLOGY WITH ANESTHESIA N/A 08/06/2022   Procedure: IR WITH ANESTHESIA;  Surgeon: Luanne Bras, MD;  Location: [place];  Service: Radiology;  Laterality: N/A;   Patient Active Problem List   Diagnosis Date Noted   Pneumonia of right lower lobe due to infectious organism    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 No      OBJECTIVE:   TODAY'S TREATMENT:  09-17-22: Assessing for possible  AAC system and targeted reading and auditory comprehension . With max A verbal  (me saying the written word) and visual cues and cues to look right, [name] ID object F:2 to written word 50% accuracy. He ID'd family to written paired with spoken name in photo F:2 and group photo F:10, with max A 30% accuracy. He matched object to black and white photo 1/5x with consistent max A to attend to right, point to object. [name] repeated names of objects 6/10x with max A and phrase completion. Repeated family names with usual mod A 5/5. Instructed and provided feedback to spouse on cueing with short, direct phrases instead of lengthy instructions which result in reduced attention to task due to processing. HW is to ID family photos to names and sort pennies and quarters to facilitate matching for possible AAC.  09-14-22: Generated list of 18 family names - [name] repeated family names and personally relevant words (yard tools). With frequent max A [name] repeated in unison with ST 6 family names. With written names choice of 2 and consistent max A, [name] ID'd correct name choice to photo of grandson 2/3x. With choice of 2 written names, cues to look right and max A, [name] ID'd spouses name 2/4x. With choice of 2 written words of yard tools vs foil, [name] ID'd  yard tool 2/4x. With this demonstration and verbal and written instruction, trained wife to provide large printed choice of 2 words to ID in context, providing example of helping him select the correct meat or drink for dinner or correct family name choice of 2 to a photo. Ongoing instruction required to ascertain if [name] can make choice of 2 words for AAC. [name] repeated asked [name] if he was cold. Initiated training of gestures for cold, hot, thirsty with [name] imitating my gesture 2/5 attempts for each. Instructed [name] to have her son help her get photos of family and [name]'s tools on her phone for naming tasks. Automatic speech: [name] completed 8/10 idioms with usual repetition  of phrase. Frequent ongoing cues to keep head up, look at me and look right to ID written words     SLP advised pt and spouse of observations and recommendations for implementation of ST services. Collaborated to generate goals and form plan of care. Pt and spouse are in agreement with proposed POC, deny any questions at conclusion of session.    PATIENT EDUCATION: Education details: see above Person educated: Patient and Spouse Education method: Explanation, Demonstration, and Handouts Education comprehension: verbalized understanding, returned demonstration, and needs further education   GOALS: Goals reviewed with patient? Yes   SHORT TERM GOALS: Target date: 10/12/2022   Pt will average 80% accuracy in practical naming tasks with usual mod-A over 2 sessions.  Baseline: Goal status: INITIAL   2.  Spouse will teach back supportive communication strategies to aid in auditory comprehension at home with supervision A.   Baseline:  Goal status: INITIAL   3.  Pt will comprehend  written choice f:3 at word level with known context 80% to make accurate selections of preferences, answer questions at home with occasional min A Baseline:  Goal status: INITIAL   4.  Pt will be intelligible in 2 word phrase production in 15/20 sentences with direct model and usual mod-A  Baseline:  Goal status: INITIAL   5.  Pt will complete augmentative communication/SGD canidacy assessment to determine appropriate communication device to support communication of wants/needs/thoughts Baseline:  Goal status: INITIAL     LONG TERM GOALS: Target date: 11/30/2022    Pt will use AAC/multimodal communicate to communicate choices, wants at home with family and caregiver over 1 week Baseline:  Goal status: INITIAL   2.  Pt will name 5 items in personally relevant categories with occasional mod A over 2 sessions Baseline:  Goal status: INITIAL   3.  Pt will employ dysarthria strategies resulting in 50%  intelligibility in self-generated words and phrases with usual mod-A Baseline:  Goal status: INITIAL   4.  Pt will participate in clinical swallow evaluation to assess for candidacy for upgraded diet textures and liquid consistencies PRN Baseline:  Goal status: INITIAL     ASSESSMENT:   CLINICAL IMPRESSION: Patient is a 71 y.o. M who was seen today for cognitive linguistic and dysphagia evaluation following CVA. Pt presenting with severe non-fluent aphasia, moderate dysarthria, oropharyngeal dysphagia, and cognitive deficits in areas of attention, memory, executive functioning.  Significant expressive > receptive language deficits impacting overall communication abilities. Pt spouse reporting usual frustration at home, though are employing yes/no questioning to determine wants/needs. Pt evidences frustration with communication limitations this date: crying but insists on persisting through evaluation. Notable difficulties in communicating beyond answer y/n questions, unable to provide substantive response to any open ended question or choose from written or spoken FO2 or FO4. 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 Dysarthria characterized by reduced vocal intensity and imprecise articulation which results in intelligibility reduced to ~50% at single word level. Per MBSS, pt with oropharyngeal dysphagia, currently on nectar thickened liquids with purees at home. Dysphagia exercises or strategies are contraindicated at this time d/t difficulty evidenced with following directions and impaired receptive language and memory. Plan to monitor pt's diet tolerance, complete subsequent clinical swallow evaluation, and adjust dysphagia intervention or recommendations based on pt progress. Decreased working memory and attention evidenced throughout evaluation, requiring SLP to redirect pt with verbal cues. Severity of cognitive impairments difficult to assess 2/2 communication impairment. Pt  benefiting from usual repetition, use of simple and direct language, visual and verbal cueing for participation in assessment. SLP recommends skilled ST intervention course to facilitate improved communication efficacy, reduce caregiver burden, and improve QoL.    OBJECTIVE IMPAIRMENTS include attention, memory, executive functioning, aphasia, dysarthria, and dysphagia. These impairments are limiting patient from ADLs/IADLs, effectively communicating at home and in community, and safety when swallowing. Factors affecting potential to achieve goals and functional outcome are ability to learn/carryover information, co-morbidities, previous level of function, and financial resources. Patient will benefit from skilled SLP services to address above impairments and improve overall function.   REHAB POTENTIAL: Fair co-morbidities   PLAN: SLP FREQUENCY: 2x/week   SLP DURATION: 12 weeks   PLANNED INTERVENTIONS: Aspiration precaution training, Pharyngeal strengthening exercises, Diet toleration management , Language facilitation, Trials of upgraded texture/liquids, Cueing hierachy, Cognitive reorganization, Internal/external aids, Oral motor exercises, Functional tasks, Multimodal communication approach, SLP instruction and feedback, Compensatory strategies, Patient/family education, and Re-evaluation           Lynee Rosenbach, Annye Rusk, CCC-SLP 09/17/2022, 2:55 PM

## 2022-09-17 NOTE — Patient Instructions (Signed)
   Match (point) written name to picture and to you saying it  Sort pennies and quarters - if this is easy add in dimes  Repeat names of family

## 2022-09-21 ENCOUNTER — Encounter: Payer: Medicare HMO | Attending: Physical Medicine and Rehabilitation | Admitting: Physical Medicine and Rehabilitation

## 2022-09-21 ENCOUNTER — Encounter: Payer: Self-pay | Admitting: Physical Medicine and Rehabilitation

## 2022-09-21 VITALS — BP 103/65 | HR 45 | Ht 72.0 in | Wt 133.0 lb

## 2022-09-21 DIAGNOSIS — I63419 Cerebral infarction due to embolism of unspecified middle cerebral artery: Secondary | ICD-10-CM | POA: Insufficient documentation

## 2022-09-21 DIAGNOSIS — I63512 Cerebral infarction due to unspecified occlusion or stenosis of left middle cerebral artery: Secondary | ICD-10-CM | POA: Diagnosis not present

## 2022-09-21 DIAGNOSIS — R64 Cachexia: Secondary | ICD-10-CM | POA: Diagnosis present

## 2022-09-21 DIAGNOSIS — K219 Gastro-esophageal reflux disease without esophagitis: Secondary | ICD-10-CM | POA: Insufficient documentation

## 2022-09-21 DIAGNOSIS — E441 Mild protein-calorie malnutrition: Secondary | ICD-10-CM | POA: Insufficient documentation

## 2022-09-21 MED ORDER — MIRTAZAPINE 7.5 MG PO TABS: 7.5000 mg | ORAL_TABLET | Freq: Every day | ORAL | 3 refills | Status: DC

## 2022-09-21 MED ORDER — PANTOPRAZOLE SODIUM 20 MG PO TBEC: 20.0000 mg | DELAYED_RELEASE_TABLET | Freq: Every day | ORAL | 3 refills | Status: DC

## 2022-09-21 MED ORDER — CHOLECALCIFEROL 10 MCG (400 UNIT) PO TABS: 800.0000 [IU] | ORAL_TABLET | Freq: Every day | ORAL | 0 refills | Status: DC

## 2022-09-21 NOTE — Progress Notes (Signed)
Subjective:    Patient ID: Dustin Blanchard, male    DOB: 12/09/1951, 71 y.o.   MRN: [id number]  HPI Dustin Blanchard is a 71 year old man who presents for hospital up after CIR admission for left MCA CVA.   1) Malnutrition -cleans his plate -BMI [id number]  2) Depressed mood -sometimes depressed about his limitations -has ben emotional   3) Acid reflux -he has been taking pantoprazole  4) CVA -going to therapy twice per week.  -he wants to get out of the house.  -has not been to church yet  5) Dysphagia -wife has been getting good at [place].     Pain Inventory Average Pain 0 Pain Right Now 0 My pain is no pain  LOCATION OF PAIN  no pain  BOWEL Number of stools per week: 7 Oral laxative use Yes  Type of laxative miralax  BLADDER Normal and Pads    Mobility walk with assistance use a walker use a wheelchair Do you have any goals in this area?  yes  Function retired  Neuro/Psych weakness trouble walking confusion depression  Prior Studies Any changes since last visit?  no  Physicians involved in your care Any changes since last visit?  no   No family history on file. Social History   Socioeconomic History   Marital status: Married    Spouse name: Not on file   Number of children: Not on file   Years of education: Not on file   Highest education level: Not on file  Occupational History   Not on file  Tobacco Use   Smoking status: Former   Smokeless tobacco: Never  Vaping Use   Vaping Use: Never used  Substance and Sexual Activity   Alcohol use: No   Drug use: Not Currently   Sexual activity: Never  Other Topics Concern   Not on file  Social History Narrative   Not on file   Social Determinants of Health   Financial Resource Strain: Not on file  Food Insecurity: Not on file  Transportation Needs: Not on file  Physical Activity: Not on file  Stress: Not on file  Social Connections: Not on file   Past Surgical History:  Procedure  Laterality Date   HERNIA REPAIR     HIP SURGERY     IR CT HEAD LTD  08/06/2022   IR CT HEAD LTD  08/06/2022   IR PERCUTANEOUS ART THROMBECTOMY/INFUSION INTRACRANIAL INC DIAG ANGIO  08/06/2022   IR US GUIDE VASC ACCESS RIGHT  08/06/2022   RADIOLOGY WITH ANESTHESIA N/A 08/06/2022   Procedure: IR WITH ANESTHESIA;  Surgeon: Luanne Bras, MD;  Location: [place];  Service: Radiology;  Laterality: N/A;   Past Medical History:  Diagnosis Date   Dementia ([place])    Ht 6' (1.829 m)   Wt 133 lb (60.3 kg)   BMI 18.04 kg/m   Opioid Risk Score:   Fall Risk Score:  `1  Depression screen PHQ 2/9      No data to display          Review of Systems  Eyes:  Positive for visual disturbance.  Musculoskeletal:  Positive for gait problem.  Neurological:  Positive for weakness.  Psychiatric/Behavioral:  Positive for confusion.        Depression  All other systems reviewed and are negative.      Objective:   Physical Exam Gen: no distress, normal appearing HEENT: oral mucosa pink and moist, NCAT Cardio: Reg rate Chest: normal  effort, normal rate of breathing Abd: soft, non-distended Ext: no edema Psych: pleasant, normal affect Skin: intact Neuro: 4/5 strength       Assessment & Plan:   1) CVA -continue therapies -would benefit from handicap placard to increase mobility in the community -reviewed all medications and provided necessary refills.  2) Depression: -refilled mirtazepine  2) Malnutrition -discussed highly nutritious foods  4) Cachexia -recommended resistance training  5) Bradycardic to 42: -recommended discussing with cardiology whether amiodarone dose should be decreased

## 2022-09-21 NOTE — Progress Notes (Signed)
I agree with the above plan 

## 2022-09-21 NOTE — Patient Instructions (Signed)
[place] 

## 2022-09-22 LAB — BASIC METABOLIC PANEL
BUN/Creatinine Ratio: 10 (ref 10–24)
BUN: 11 mg/dL (ref 8–27)
CO2: 24 mmol/L (ref 20–29)
Calcium: 9.5 mg/dL (ref 8.6–10.2)
Chloride: 105 mmol/L (ref 96–106)
Creatinine, Ser: 1.1 mg/dL (ref 0.76–1.27)
Glucose: 91 mg/dL (ref 70–99)
Potassium: 5.1 mmol/L (ref 3.5–5.2)
Sodium: 141 mmol/L (ref 134–144)
eGFR: 72 mL/min/{1.73_m2} (ref >59)

## 2022-09-23 ENCOUNTER — Ambulatory Visit: Payer: Medicare HMO | Attending: Student | Admitting: Speech Pathology

## 2022-09-23 ENCOUNTER — Ambulatory Visit: Payer: Medicare HMO | Admitting: Physical Therapy

## 2022-09-23 DIAGNOSIS — R4701 Aphasia: Secondary | ICD-10-CM | POA: Insufficient documentation

## 2022-09-23 DIAGNOSIS — R41842 Visuospatial deficit: Secondary | ICD-10-CM | POA: Insufficient documentation

## 2022-09-23 DIAGNOSIS — I69351 Hemiplegia and hemiparesis following cerebral infarction affecting right dominant side: Secondary | ICD-10-CM | POA: Insufficient documentation

## 2022-09-23 DIAGNOSIS — R41841 Cognitive communication deficit: Secondary | ICD-10-CM | POA: Diagnosis present

## 2022-09-23 DIAGNOSIS — R1310 Dysphagia, unspecified: Secondary | ICD-10-CM | POA: Insufficient documentation

## 2022-09-23 DIAGNOSIS — I69318 Other symptoms and signs involving cognitive functions following cerebral infarction: Secondary | ICD-10-CM | POA: Diagnosis present

## 2022-09-23 DIAGNOSIS — R278 Other lack of coordination: Secondary | ICD-10-CM | POA: Diagnosis present

## 2022-09-23 DIAGNOSIS — R41844 Frontal lobe and executive function deficit: Secondary | ICD-10-CM | POA: Insufficient documentation

## 2022-09-23 DIAGNOSIS — R482 Apraxia: Secondary | ICD-10-CM | POA: Insufficient documentation

## 2022-09-23 DIAGNOSIS — R2681 Unsteadiness on feet: Secondary | ICD-10-CM | POA: Diagnosis present

## 2022-09-23 DIAGNOSIS — R2689 Other abnormalities of gait and mobility: Secondary | ICD-10-CM

## 2022-09-23 DIAGNOSIS — M6281 Muscle weakness (generalized): Secondary | ICD-10-CM | POA: Insufficient documentation

## 2022-09-23 DIAGNOSIS — R1312 Dysphagia, oropharyngeal phase: Secondary | ICD-10-CM | POA: Insufficient documentation

## 2022-09-23 DIAGNOSIS — R41840 Attention and concentration deficit: Secondary | ICD-10-CM | POA: Diagnosis present

## 2022-09-23 NOTE — Therapy (Signed)
OUTPATIENT SPEECH LANGUAGE PATHOLOGY TREATMENT NOTE   Patient Name: Dustin Blanchard MRN: [id number] DOB:11/06/1951, 71 y.o., male Today's Date: 09/23/2022  [facility] PROVIDER: Barbie Banner, PA-C   END OF SESSION:   End of Session - 09/23/22 1013     Visit Number 4    Number of Visits 25    Date for SLP Re-Evaluation 11/30/22    Authorization Type [employer] Medicare    Progress Note Due on Visit 10    SLP Start Time 1016    SLP Stop Time  1100    SLP Time Calculation (min) 44 min    Activity Tolerance Patient tolerated treatment well;Other (comment)   poor frsutration tolerance             Past Medical History:  Diagnosis Date   Dementia [facility])    Past Surgical History:  Procedure Laterality Date   HERNIA REPAIR     HIP SURGERY     IR CT HEAD LTD  08/06/2022   IR CT HEAD LTD  08/06/2022   IR PERCUTANEOUS ART THROMBECTOMY/INFUSION INTRACRANIAL INC DIAG ANGIO  08/06/2022   IR US GUIDE VASC ACCESS RIGHT  08/06/2022   RADIOLOGY WITH ANESTHESIA N/A 08/06/2022   Procedure: IR WITH ANESTHESIA;  Surgeon: Luanne Bras, MD;  Location: [place];  Service: Radiology;  Laterality: N/A;   Patient Active Problem List   Diagnosis Date Noted   Pneumonia of right lower lobe due to infectious organism    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 No  OBJECTIVE:   TODAY'S  TREATMENT:  09-23-22: Target verbal expression through automatic speech tasks. Able to count to 10 and say ABCs with reduced intelligibility with choral production and visual aid. Pt with awareness of paraphasias and perseveration, resulting in crying. Target receptive language through Y/N questions, with pt achieving 70% accuracy with usual repetitions and use of direct, simple language. Target ID of objects from FO2 with written and verbal presentation. With max-A, pt achieved 50% accuracy, with occasion of not selecting any answer (tapping the table around the stimuli). With verbal model and max cueing, pt repeated named objects in 40% of trials. Reduced attention, with pt easily distracted this date, requires verbal cues to reorient to task. Pt with overt frustration, crying, numerous times throughout session.   09-17-22: Assessing for possible AAC system and targeted reading and auditory comprehension . With max A verbal  (me saying the written word) and visual cues and cues to look right, Dustin Blanchard ID object F:2 to written word 50% accuracy. He ID'd family to written paired with spoken name in photo F:2 and group photo F:10, with max A 30% accuracy. He matched object to black and white photo 1/5x with consistent max A to attend to right, point to object. Dustin Blanchard repeated names of objects 6/10x with max A and phrase completion. Repeated family names with usual mod A 5/5. Instructed  and provided feedback to spouse on cueing with short, direct phrases instead of lengthy instructions which result in reduced attention to task due to processing. HW is to ID family photos to names and sort pennies and quarters to facilitate matching for possible AAC.  09-14-22: Generated list of 18 family names - Dustin Blanchard repeated family names and personally relevant words (yard tools). With frequent max A Dustin Blanchard repeated in unison with ST 6 family names. With written names choice of 2 and consistent max A, Dustin Blanchard ID'd correct name choice to  photo of grandson 2/3x. With choice of 2 written names, cues to look right and max A, [name] ID'd spouses name 2/4x. With choice of 2 written words of yard tools vs foil, Dustin Blanchard ID'd yard tool 2/4x. With this demonstration and verbal and written instruction, trained wife to provide large printed choice of 2 words to ID in context, providing example of helping him select the correct meat or drink for dinner or correct family name choice of 2 to a photo. Ongoing instruction required to ascertain if Dustin Blanchard can make choice of 2 words for AAC. Dustin Blanchard repeated asked Dustin Blanchard if he was cold. Initiated training of gestures for cold, hot, thirsty with Dustin Blanchard imitating my gesture 2/5 attempts for each. Instructed Dustin Blanchard to have her son help her get photos of family and Dustin Blanchard's tools on her phone for naming tasks. Automatic speech: Dustin Blanchard completed 8/10 idioms with usual repetition of phrase. Frequent ongoing cues to keep head up, look at me and look right to ID written words   PATIENT EDUCATION: Education details: see above Person educated: Patient and Spouse Education method: Explanation, Demonstration, and Handouts Education comprehension: verbalized understanding, returned demonstration, and needs further education   GOALS: Goals reviewed with patient? Yes   SHORT TERM GOALS: Target date: 10/12/2022   Pt will average 80% accuracy in practical naming tasks with usual mod-A over 2 sessions.  Baseline: Goal status: INITIAL   2.  Spouse will teach back supportive communication strategies to aid in auditory comprehension at home with supervision A.   Baseline:  Goal status: INITIAL   3.  Pt will comprehend  written choice f:3 at word level with known context 80% to make accurate selections of preferences, answer questions at home with occasional min A Baseline:  Goal status: INITIAL   4.  Pt will be intelligible in 2 word phrase production in 15/20 sentences with direct model and usual mod-A  Baseline:  Goal status:  INITIAL   5.  Pt will complete augmentative communication/SGD canidacy assessment to determine appropriate communication device to support communication of wants/needs/thoughts Baseline:  Goal status: INITIAL     LONG TERM GOALS: Target date: 11/30/2022    Pt will use AAC/multimodal communicate to communicate choices, wants at home with family and caregiver over 1 week Baseline:  Goal status: INITIAL   2.  Pt will name 5 items in personally relevant categories with occasional mod A over 2 sessions Baseline:  Goal status: INITIAL   3.  Pt will employ dysarthria strategies resulting in 50% intelligibility in self-generated words and phrases with usual mod-A Baseline:  Goal status: INITIAL   4.  Pt will participate in clinical swallow evaluation to assess for candidacy for upgraded diet textures and liquid consistencies PRN Baseline:  Goal status: INITIAL     ASSESSMENT:   CLINICAL IMPRESSION: Patient is a 71 y.o. M who was seen today for cognitive linguistic and dysphagia evaluation following CVA. Pt presenting with severe non-fluent aphasia,  moderate dysarthria, oropharyngeal dysphagia, and cognitive deficits in areas of attention, memory, executive functioning.  Significant expressive > receptive language deficits impacting overall communication abilities. Pt spouse reporting usual frustration at home, though are employing yes/no questioning to determine wants/needs. Pt evidences frustration with communication limitations this date: crying but insists on persisting through evaluation. Notable difficulties in communicating beyond answer y/n questions, unable to provide substantive response to any open ended question or choose from written or spoken FO2 or FO4. 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 Dysarthria characterized by reduced vocal intensity and imprecise articulation which results in intelligibility reduced to ~50% at single word level. Per MBSS, pt with  oropharyngeal dysphagia, currently on nectar thickened liquids with purees at home. Dysphagia exercises or strategies are contraindicated at this time d/t difficulty evidenced with following directions and impaired receptive language and memory. Plan to monitor pt's diet tolerance, complete subsequent clinical swallow evaluation, and adjust dysphagia intervention or recommendations based on pt progress. Decreased working memory and attention evidenced throughout evaluation, requiring SLP to redirect pt with verbal cues. Severity of cognitive impairments difficult to assess 2/2 communication impairment. Pt benefiting from usual repetition, use of simple and direct language, visual and verbal cueing for participation in assessment. SLP recommends skilled ST intervention course to facilitate improved communication efficacy, reduce caregiver burden, and improve QoL.    OBJECTIVE IMPAIRMENTS include attention, memory, executive functioning, aphasia, dysarthria, and dysphagia. These impairments are limiting patient from ADLs/IADLs, effectively communicating at home and in community, and safety when swallowing. Factors affecting potential to achieve goals and functional outcome are ability to learn/carryover information, co-morbidities, previous level of function, and financial resources. Patient will benefit from skilled SLP services to address above impairments and improve overall function.   REHAB POTENTIAL: Fair co-morbidities   PLAN: SLP FREQUENCY: 2x/week   SLP DURATION: 12 weeks   PLANNED INTERVENTIONS: Aspiration precaution training, Pharyngeal strengthening exercises, Diet toleration management , Language facilitation, Trials of upgraded texture/liquids, Cueing hierachy, Cognitive reorganization, Internal/external aids, Oral motor exercises, Functional tasks, Multimodal communication approach, SLP instruction and feedback, Compensatory strategies, Patient/family education, and Re-evaluation   Su Monks, CCC-SLP 09/23/2022, 12:00 PM

## 2022-09-23 NOTE — Therapy (Signed)
OUTPATIENT PHYSICAL THERAPY NEURO EVALUATION   Patient Name: Dustin Blanchard MRN: [id number] DOB:[birth year], 71 y.o., male Today's Date: 09/23/2022   PCP: Dr. Loletha Grayer at [facility] PROVIDER: Barbie Banner, PA-C    PT End of Session - 09/23/22 1103     Visit Number 3    Number of Visits 17   with eval   Date for PT Re-Evaluation 11/23/22   to allow for scheduling delays   Authorization Type [employer] Medicare    Progress Note Due on Visit 10    PT Start Time 1104    PT Stop Time 1145    PT Time Calculation (min) 41 min    Equipment Utilized During Treatment Gait belt    Activity Tolerance Patient tolerated treatment well    Behavior During Therapy [facility] for tasks assessed/performed;Flat affect              Past Medical History:  Diagnosis Date   Dementia [facility])    Past Surgical History:  Procedure Laterality Date   HERNIA REPAIR     HIP SURGERY     IR CT HEAD LTD  08/06/2022   IR CT HEAD LTD  08/06/2022   IR PERCUTANEOUS ART THROMBECTOMY/INFUSION INTRACRANIAL INC DIAG ANGIO  08/06/2022   IR US GUIDE VASC ACCESS RIGHT  08/06/2022   RADIOLOGY WITH ANESTHESIA N/A 08/06/2022   Procedure: IR WITH ANESTHESIA;  Surgeon: Luanne Bras, MD;  Location: [place];  Service: Radiology;  Laterality: N/A;   Patient Active Problem List   Diagnosis Date Noted   Pneumonia of right lower lobe due to infectious organism    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   Sinus pause    Acute combined systolic and diastolic heart failure (HCC)    Atrial fibrillation with RVR (HCC)    Acute on chronic combined systolic and diastolic CHF (congestive heart failure) ([place])    Acute ischemic left MCA stroke ([place]) 08/06/2022    ONSET DATE: 08/26/2022   REFERRING DIAG: I63.9 (ICD-10-CM) - CVA (cerebral vascular accident) ([place])   THERAPY DIAG:  Other lack of coordination  Unsteadiness on feet  Other abnormalities of gait and mobility  Rationale for Evaluation  and Treatment Rehabilitation  SUBJECTIVE:                                                                                                                                                                                              SUBJECTIVE STATEMENT: [name] reports he is emotional today following speech therapy session. Pt unable to verbalize his feelings this date but able to indicate he has no  pain and no fatigue.  Pt accompanied by: significant other wife Dustin Blanchard  PERTINENT HISTORY: hypertension, hyperlipidemia, sinus bradycardia/chronic CHF with mild regurgitation, BPH, history of tobacco use, vascular dementia diagnosed 2022   PAIN:  Are you having pain? No  PRECAUTIONS: Fall and Other: aphasia, dysphagia 1 (nectar-thick)  WEIGHT BEARING RESTRICTIONS No  FALLS: Has patient fallen in last 6 months? Yes. Number of falls one fall when he had the stroke, no other falls  LIVING ENVIRONMENT: Lives with: lives with their spouse Lives in: House/apartment Stairs: Yes: External: 4 steps; can reach both Has following equipment at home: Walker - 2 wheeled and transport chair  PLOF: Needs assistance with gait, Needs assistance with transfers, and uses RW in the home with Supervision, uses transport chair for community mobility  PATIENT GOALS "to get him back to moving and walking better"  OBJECTIVE:   TODAY'S TREATMENT:  GAIT: Gait pattern:  anterior lean, trunk flexed, and wide BOS Distance walked: 230 ft Assistive device utilized: None Level of assistance: CGA Comments: small decrease in balance/stability without use of AD, ongoing severe R neglect with max cues needed for safety  THER ACT: Static and dynamic balance challenges with use of ball this date due to pt interest in basketball: -static stand performing ball bounce with CGA for balance, use of RUE to bounce ball to increase attention to R hemi-body -added in counting ball bounces to increase vocalization, pt able to  count up to 10 -ball bounce back and forth with 2nd person with focus on scanning and reaching to R side -pt unable to demonstrate ability to count while performing ball bounce with 2nd person   -gait with ball bounce x 230 ft with focus on increasing amplitude of bouncing and safety awareness as well as environmental awareness, min A for balance -ball bounce while weaving through cones with min A for balance, pt does well maintaining control of ball mainly with RUE occasionally with LUE and navigating through cones without running into obstacles  -attempt weighted 1kg and 2 kg ball bounce against rebounder but pt has difficulty catching ball due to its weight -unweighted ball bounce against rebounder 2 x 10 reps with mod cueing to increase power of toss against rebounder, min A for balance  -weighted 2kg ball tap to various colored dots to focus on scanning environment to the R to find targets, min cueing to scan to the R  Majority of therapy session performed without use of AD. Continue to recommend use of RW at home for increased safety and independence with functional mobility. Will continue to work towards decreased AD reliance as pt is safe and able.   PATIENT EDUCATION: Education details: R neglect, one-step simple commands, continue HEP Person educated: Patient and Spouse Education method: Customer service manager Education comprehension: verbalized understanding, returned demonstration, verbal cues required, and needs further education   HOME EXERCISE PROGRAM: Access Code: [email or web address] URL: [email or web address] Date: 09/17/2022 Prepared by: Excell Seltzer  Exercises - Side Stepping with Counter Support  - 1 x daily - 7 x weekly - 1 sets - 5 reps - Tandem Walking with Counter Support  - 1 x daily - 7 x weekly - 1 sets - 5 reps - Alternating Step Taps with Counter Support  - 1 x daily - 7 x weekly - 2 sets - 10 reps   GOALS: Goals reviewed with patient?  Yes  SHORT TERM GOALS: Target date: 10/12/2022  Pt will be independent with initial HEP for improved strength, balance,  transfers and gait.  Baseline: Goal status: INITIAL  2.  Pt will improve gait velocity to at least 2.0 ft/sec for improved gait efficiency and performance at Supervision level   Baseline: 1.82 ft/sec (9/25) Goal status: INITIAL  3.  Pt will improve normal TUG to less than or equal to 30 seconds for improved functional mobility and decreased fall risk.  Baseline: 40.43 sec (9/25) Goal status: INITIAL  4.  Pt will improve Berg score to 30/56 for decreased fall risk  Baseline: 26/56 (9/25) Goal status: INITIAL  5.  Pt will improve 5 x STS to less than or equal to 30 seconds to demonstrate improved functional strength and transfer efficiency.   Baseline: 34.62 sec (9/25) Goal status: INITIAL   LONG TERM GOALS: Target date: 11/09/2022  Pt will be independent with final HEP for improved strength, balance, transfers and gait.  Baseline:  Goal status: INITIAL  2.  Pt will improve gait velocity to at least 2.25 ft/sec for improved gait efficiency and performance at Supervision level   Baseline: 1.82 ft/sec (9/25) Goal status: INITIAL  3.  Pt will improve normal TUG to less than or equal to 20 seconds for improved functional mobility and decreased fall risk.  Baseline: 40.43 sec (9/25) Goal status: INITIAL  4.  Pt will improve Berg score to 34/56 for decreased fall risk  Baseline: 26/56 (925) Goal status: INITIAL  5.  Pt will improve 5 x STS to less than or equal to 25 seconds to demonstrate improved functional strength and transfer efficiency.   Baseline: 34.62 sec (9/25) Goal status: INITIAL   ASSESSMENT:  CLINICAL IMPRESSION: Emphasis of skilled PT session on working on increased attention to R hemibody, increased attention to environment on R side, and static and dynamic standing balance. Pt exhibits improved engagement in therapy session with  improved ability to follow cues this date. Pt exhibits improved attention to his R hemibody with use of ball this session due to improved pt interest in session and therapy buy-in. Pt continues to exhibit decreased static and dynamic standing balance and ongoing R neglect increasing his fall risk and decreasing his independence with functional mobility. Pt continues to benefit from skilled therapy services to decrease fall risk and increase safety and independence. Continue POC.    OBJECTIVE IMPAIRMENTS Abnormal gait, decreased balance, decreased cognition, decreased knowledge of condition, decreased knowledge of use of DME, decreased mobility, decreased strength, decreased safety awareness, and impaired vision/preception.   ACTIVITY LIMITATIONS carrying, lifting, bending, standing, squatting, stairs, and transfers  PARTICIPATION LIMITATIONS: meal prep, cleaning, laundry, medication management, interpersonal relationship, driving, shopping, community activity, and yard work  PERSONAL FACTORS 1-2 comorbidities:    PMH that includes:  hypertension, hyperlipidemia, sinus bradycardia/chronic CHF with mild regurgitation, BPH, history of tobacco use, vascular dementia diagnosed 2022 are also affecting patient's functional outcome.   REHAB POTENTIAL: Fair history of dementia and severity of R neglect/inattention  CLINICAL DECISION MAKING: Stable/uncomplicated  EVALUATION COMPLEXITY: Moderate  PLAN: PT FREQUENCY: 2x/week  PT DURATION: 8 weeks  PLANNED INTERVENTIONS: Therapeutic exercises, Therapeutic activity, Neuromuscular re-education, Balance training, Gait training, Patient/Family education, Self Care, Joint mobilization, Stair training, Vestibular training, Canalith repositioning, Visual/preceptual remediation/compensation, Orthotic/Fit training, DME instructions, Aquatic Therapy, Dry Needling, Cognitive remediation, Electrical stimulation, Wheelchair mobility training, Cryotherapy, Moist heat,  Taping, Manual therapy, and Re-evaluation  PLAN FOR NEXT SESSION: add to HEP for balance and R attention, work on [employer], visual scanning to the R and attention to R side, safety with transfers and mobility  with LRAD, enjoys basketball so incorporating that or other sports into treatment session   Excell Seltzer, PT, DPT, CSRS 09/23/2022, 11:49 AM

## 2022-09-25 ENCOUNTER — Ambulatory Visit: Payer: Medicare HMO | Admitting: Speech Pathology

## 2022-09-25 ENCOUNTER — Ambulatory Visit: Payer: Medicare HMO | Admitting: Physical Therapy

## 2022-09-25 DIAGNOSIS — R2681 Unsteadiness on feet: Secondary | ICD-10-CM

## 2022-09-25 DIAGNOSIS — I69351 Hemiplegia and hemiparesis following cerebral infarction affecting right dominant side: Secondary | ICD-10-CM

## 2022-09-25 DIAGNOSIS — R4701 Aphasia: Secondary | ICD-10-CM | POA: Diagnosis not present

## 2022-09-25 DIAGNOSIS — R41841 Cognitive communication deficit: Secondary | ICD-10-CM

## 2022-09-25 DIAGNOSIS — R278 Other lack of coordination: Secondary | ICD-10-CM

## 2022-09-25 DIAGNOSIS — R1312 Dysphagia, oropharyngeal phase: Secondary | ICD-10-CM

## 2022-09-25 NOTE — Therapy (Signed)
OUTPATIENT PHYSICAL THERAPY NEURO TREATMENT   Patient Name: Dustin Blanchard MRN: [id number] DOB:October 27, 1951, 71 y.o., male Today's Date: 09/25/2022   PCP: Dr. Loletha Grayer at [facility] PROVIDER: Barbie Banner, PA-C    PT End of Session - 09/25/22 1105     Visit Number 4    Number of Visits 17   with eval   Date for PT Re-Evaluation 11/23/22   to allow for scheduling delays   Authorization Type [employer]    Progress Note Due on Visit 10    PT Start Time 1103   Handoff w/OT   PT Stop Time 1143    PT Time Calculation (min) 40 min    Equipment Utilized During Treatment Gait belt    Activity Tolerance Patient tolerated treatment well    Behavior During Therapy [facility] for tasks assessed/performed;Flat affect;Impulsive               Past Medical History:  Diagnosis Date   Dementia [facility])    Past Surgical History:  Procedure Laterality Date   HERNIA REPAIR     HIP SURGERY     IR CT HEAD LTD  08/06/2022   IR CT HEAD LTD  08/06/2022   IR PERCUTANEOUS ART THROMBECTOMY/INFUSION INTRACRANIAL INC DIAG ANGIO  08/06/2022   IR US GUIDE VASC ACCESS RIGHT  08/06/2022   RADIOLOGY WITH ANESTHESIA N/A 08/06/2022   Procedure: IR WITH ANESTHESIA;  Surgeon: Luanne Bras, MD;  Location: [place];  Service: Radiology;  Laterality: N/A;   Patient Active Problem List   Diagnosis Date Noted   Pneumonia of right lower lobe due to infectious organism    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   Sinus pause    Acute combined systolic and diastolic heart failure (HCC)    Atrial fibrillation with RVR (HCC)    Acute on chronic combined systolic and diastolic CHF (congestive heart failure) ([place])    Acute ischemic left MCA stroke ([place]) 08/06/2022    ONSET DATE: 08/26/2022   REFERRING DIAG: I63.9 (ICD-10-CM) - CVA (cerebral vascular accident) ([place])   THERAPY DIAG:  Other lack of coordination  Unsteadiness on feet  Hemiplegia and hemiparesis following cerebral  infarction affecting right dominant side ([place])  Rationale for Evaluation and Treatment Rehabilitation  SUBJECTIVE:                                                                                                                                                                                              SUBJECTIVE STATEMENT: [name] inquiring about whether pt can use a cane rather than a walker as he tends  to "just go" and be impulsive. SLP stating that pt will stand up impulsively during session as well. No new falls or changes.   Pt accompanied by: significant other wife [name]  PERTINENT HISTORY: hypertension, hyperlipidemia, sinus bradycardia/chronic CHF with mild regurgitation, BPH, history of tobacco use, vascular dementia diagnosed 2022   PAIN:  Are you having pain? No  PRECAUTIONS: Fall and Other: aphasia, dysphagia 1 (nectar-thick)  WEIGHT BEARING RESTRICTIONS No  FALLS: Has patient fallen in last 6 months? Yes. Number of falls one fall when he had the stroke, no other falls  PLOF: Needs assistance with gait, Needs assistance with transfers, and uses RW in the home with Supervision, uses transport chair for community mobility  PATIENT GOALS "to get him back to moving and walking better"  OBJECTIVE:   TODAY'S TREATMENT:  Gait Training  Gait pattern: step through pattern, decreased arm swing- Right, decreased step length- Right, decreased step length- Left, decreased stride length, decreased hip/knee flexion- Right, decreased hip/knee flexion- Left, decreased ankle dorsiflexion- Right, decreased ankle dorsiflexion- Left, scissoring, decreased trunk rotation, narrow BOS, poor foot clearance- Right, and poor foot clearance- Left Distance walked: 230' turning to L, 115' turning to R  Assistive device utilized:  [facility] w/Quad tip on L side Level of assistance: CGA, Min A, and max verbal cues for R visual scanning and proper cane sequencing  Comments: When turning to L side, pt  requires CGA and min cues for proper sequence of cane (pt repeatedly trying to switch cane from L to R hand). When turning to R, pt unable to stay on blue track on floor and requires max A to attend to R side. Pt frequently veering into obstacles that were anterolateral to him on R side, requiring min A to prevent hitting obstacles. Pt unresponsive to verbal, tactile or visual cues to attend to R side. Provided max multimodal cues for forward gaze, as pt stares down at his feet, but pt unable to follow cues.   NMR  In // bars for improved R attention and lateral reaching: -Ipsilateral cone reach and cross-body stack, x8 cones per side. Pt required total multimodal cues to perform task, as pt unable to find cones on R side and perseverated on cones on L side. Pt grunting in frustration when unable to locate cones on R side despite therapist providing multiple clues to where cones are (tapping, moving pt's R hand to cone, using mirror to show pt). At this time, therapist unable to determine which cues work best for pt, but did note that pt is more attentive to R side when seated vs standing.   Gait pattern: step through pattern, decreased arm swing- Right, decreased arm swing- Left, decreased stride length, decreased hip/knee flexion- Right, decreased hip/knee flexion- Left, decreased trunk rotation, trunk flexed, and narrow BOS Distance walked: Various clinic distances  Assistive device utilized: None Level of assistance: CGA Comments: Pt requires CGA due to very narrow BOS, scissoring of LLE and R inattention    PATIENT EDUCATION: Education details: Continue HEP, encouraged pt and wife to continue to use RW at home due to his R inattention  Person educated: Patient and Spouse Education method: Customer service manager Education comprehension: verbalized understanding, returned demonstration, verbal cues required, and needs further education   HOME EXERCISE PROGRAM: Access Code:  [email or web address] URL: [email or web address] Date: 09/17/2022 Prepared by: Excell Seltzer  Exercises - Side Stepping with Counter Support  - 1 x daily - 7 x weekly - 1 sets - 5 reps -  Tandem Walking with Counter Support  - 1 x daily - 7 x weekly - 1 sets - 5 reps - Alternating Step Taps with Counter Support  - 1 x daily - 7 x weekly - 2 sets - 10 reps   GOALS: Goals reviewed with patient? Yes  SHORT TERM GOALS: Target date: 10/12/2022  Pt will be independent with initial HEP for improved strength, balance, transfers and gait.  Baseline: Goal status: INITIAL  2.  Pt will improve gait velocity to at least 2.0 ft/sec for improved gait efficiency and performance at Supervision level   Baseline: 1.82 ft/sec (9/25) Goal status: INITIAL  3.  Pt will improve normal TUG to less than or equal to 30 seconds for improved functional mobility and decreased fall risk.  Baseline: 40.43 sec (9/25) Goal status: INITIAL  4.  Pt will improve Berg score to 30/56 for decreased fall risk  Baseline: 26/56 (9/25) Goal status: INITIAL  5.  Pt will improve 5 x STS to less than or equal to 30 seconds to demonstrate improved functional strength and transfer efficiency.   Baseline: 34.62 sec (9/25) Goal status: INITIAL   LONG TERM GOALS: Target date: 11/09/2022  Pt will be independent with final HEP for improved strength, balance, transfers and gait.  Baseline:  Goal status: INITIAL  2.  Pt will improve gait velocity to at least 2.25 ft/sec for improved gait efficiency and performance at Supervision level   Baseline: 1.82 ft/sec (9/25) Goal status: INITIAL  3.  Pt will improve normal TUG to less than or equal to 20 seconds for improved functional mobility and decreased fall risk.  Baseline: 40.43 sec (9/25) Goal status: INITIAL  4.  Pt will improve Berg score to 34/56 for decreased fall risk  Baseline: 26/56 (925) Goal status: INITIAL  5.  Pt will improve 5 x STS to less than  or equal to 25 seconds to demonstrate improved functional strength and transfer efficiency.   Baseline: 34.62 sec (9/25) Goal status: INITIAL   ASSESSMENT:  CLINICAL IMPRESSION: Emphasis of skilled PT session on gait training w/LRAD and increased attention to R hemibody and environment. Pt  demonstrates improved awareness to R side when seated vs standing and is unresponsive to multimodal cues. At this time, do not recommend pt use cane due to continued R inattention and scissoring of LLE w/narrow BOS. Pt verbalized frustration w/session today but is pleasant to work with. Continue POC.     OBJECTIVE IMPAIRMENTS Abnormal gait, decreased balance, decreased cognition, decreased knowledge of condition, decreased knowledge of use of DME, decreased mobility, decreased strength, decreased safety awareness, and impaired vision/preception.   ACTIVITY LIMITATIONS carrying, lifting, bending, standing, squatting, stairs, and transfers  PARTICIPATION LIMITATIONS: meal prep, cleaning, laundry, medication management, interpersonal relationship, driving, shopping, community activity, and yard work  PERSONAL FACTORS 1-2 comorbidities:    PMH that includes:  hypertension, hyperlipidemia, sinus bradycardia/chronic CHF with mild regurgitation, BPH, history of tobacco use, vascular dementia diagnosed 2022 are also affecting patient's functional outcome.   REHAB POTENTIAL: Fair history of dementia and severity of R neglect/inattention  CLINICAL DECISION MAKING: Stable/uncomplicated  EVALUATION COMPLEXITY: Moderate  PLAN: PT FREQUENCY: 2x/week  PT DURATION: 8 weeks  PLANNED INTERVENTIONS: Therapeutic exercises, Therapeutic activity, Neuromuscular re-education, Balance training, Gait training, Patient/Family education, Self Care, Joint mobilization, Stair training, Vestibular training, Canalith repositioning, Visual/preceptual remediation/compensation, Orthotic/Fit training, DME instructions, Aquatic  Therapy, Dry Needling, Cognitive remediation, Electrical stimulation, Wheelchair mobility training, Cryotherapy, Moist heat, Taping, Manual therapy, and Re-evaluation  PLAN FOR  NEXT SESSION: Seated R inattention tasks. add to HEP for balance and R attention, work on [employer], visual scanning to the R and attention to R side, safety with transfers and mobility with LRAD, enjoys basketball so incorporating that or other sports into treatment session   Katy Brickell E Kamea Dacosta, PT, DPT 09/25/2022, 12:43 PM

## 2022-09-25 NOTE — Therapy (Signed)
OUTPATIENT SPEECH LANGUAGE PATHOLOGY TREATMENT NOTE   Patient Name: Dustin Blanchard MRN: [id number] DOB:June 30, 1951, 71 y.o., male Today's Date: 09/25/2022  PCP[facility] [place] PROVIDER: Barbie Banner, PA-C   END OF SESSION:   End of Session - 09/25/22 1016     Visit Number 5    Number of Visits 25    Date for SLP Re-Evaluation 11/30/22    Authorization Type [employer] Medicare    Progress Note Due on Visit 10    SLP Start Time 1016    SLP Stop Time  1100    SLP Time Calculation (min) 44 min    Activity Tolerance Patient tolerated treatment well;Other (comment)   poor frsutration tolerance             Past Medical History:  Diagnosis Date   Dementia [facility])    Past Surgical History:  Procedure Laterality Date   HERNIA REPAIR     HIP SURGERY     IR CT HEAD LTD  08/06/2022   IR CT HEAD LTD  08/06/2022   IR PERCUTANEOUS ART THROMBECTOMY/INFUSION INTRACRANIAL INC DIAG ANGIO  08/06/2022   IR US GUIDE VASC ACCESS RIGHT  08/06/2022   RADIOLOGY WITH ANESTHESIA N/A 08/06/2022   Procedure: IR WITH ANESTHESIA;  Surgeon: Luanne Bras, MD;  Location: [place];  Service: Radiology;  Laterality: N/A;   Patient Active Problem List   Diagnosis Date Noted   Pneumonia of right lower lobe due to infectious organism    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   Sinus pause    Acute combined systolic and diastolic heart failure (HCC)    Atrial fibrillation with RVR (HCC)    Acute on chronic combined systolic and diastolic CHF (congestive heart failure) ([place])    Acute ischemic left MCA stroke ([place]) 08/06/2022    ONSET DATE: 08/06/2022    REFERRING DIAG: I63.9 (ICD-10-CM) - CVA (cerebral vascular accident) ([place])   THERAPY DIAG:  Cognitive communication deficit  Aphasia  Oropharyngeal dysphagia  Rationale for Evaluation and Treatment Rehabilitation  SUBJECTIVE: Spouse reports trying to get pt to repeat name with some success  PAIN:  Are you  having pain? No  OBJECTIVE:   TODAY'S TREATMENT:  09-25-22: Target receptive ID of everyday objects from written FO2 with max support from SLP. SLP ensured pt read options first, providing verbal model when needed with pt repeating. Increased success with attending to both options when written vertically to accommodate for R visual cut. Pt with 70% accuracy from FO2 without semantic pairs. Given FO3, accuracy decreased to 20%. Accuracy decreased with semantically related options to 40% accuracy. Repeating item names after SLP model 90% of trials, with usual cues for increasing vocal intensity. Y/N ? 100% accuracy. Provided education to spouse on facilitative strategies demonstrated during today's session, FO2 vertically, usual repetition, having pt look at mouth for verbal production.   09-23-22: Target verbal expression through automatic speech tasks. Able to count to 10 and say ABCs with reduced intelligibility with choral production and visual aid. Pt with awareness of paraphasias and perseveration, resulting in crying. Target receptive language through Y/N questions, with pt achieving 70% accuracy with usual repetitions and use of direct, simple language. Target ID of objects from FO2 with written and verbal presentation. With max-A, pt achieved 50% accuracy, with occasion of not selecting any answer (tapping the table around the stimuli). With verbal model and max cueing, pt repeated named objects in 40% of trials. Reduced attention, with pt  easily distracted this date, requires verbal cues to reorient to task. Pt with overt frustration, crying, numerous times throughout session.   09-17-22: Assessing for possible AAC system and targeted reading and auditory comprehension . With max A verbal  (me saying the written word) and visual cues and cues to look right, Dustin Blanchard ID object F:2 to written word 50% accuracy. He ID'd family to written paired with spoken name in photo F:2 and group photo F:10, with max A  30% accuracy. He matched object to black and white photo 1/5x with consistent max A to attend to right, point to object. [name] repeated names of objects 6/10x with max A and phrase completion. Repeated family names with usual mod A 5/5. Instructed and provided feedback to spouse on cueing with short, direct phrases instead of lengthy instructions which result in reduced attention to task due to processing. HW is to ID family photos to names and sort pennies and quarters to facilitate matching for possible AAC.  09-14-22: Generated list of 18 family names - Dustin Blanchard repeated family names and personally relevant words (yard tools). With frequent max A Dustin Blanchard repeated in unison with ST 6 family names. With written names choice of 2 and consistent max A, [name] ID'd correct name choice to photo of grandson 2/3x. With choice of 2 written names, cues to look right and max A, Dustin Blanchard ID'd spouses name 2/4x. With choice of 2 written words of yard tools vs foil, [name] ID'd yard tool 2/4x. With this demonstration and verbal and written instruction, trained wife to provide large printed choice of 2 words to ID in context, providing example of helping him select the correct meat or drink for dinner or correct family name choice of 2 to a photo. Ongoing instruction required to ascertain if [name] can make choice of 2 words for AAC. Dustin Blanchard repeated asked Dustin Blanchard if he was cold. Initiated training of gestures for cold, hot, thirsty with Dustin Blanchard imitating my gesture 2/5 attempts for each. Instructed Dustin Blanchard to have her son help her get photos of family and Dustin Blanchard's tools on her phone for naming tasks. Automatic speech: Dustin Blanchard completed 8/10 idioms with usual repetition of phrase. Frequent ongoing cues to keep head up, look at me and look right to ID written words   PATIENT EDUCATION: Education details: see above Person educated: Patient and Spouse Education method: Explanation, Demonstration, and Handouts Education comprehension: verbalized  understanding, returned demonstration, and needs further education   GOALS: Goals reviewed with patient? Yes   SHORT TERM GOALS: Target date: 10/12/2022   Pt will average 80% accuracy in practical naming tasks with usual mod-A over 2 sessions.  Baseline: Goal status: INITIAL   2.  Spouse will teach back supportive communication strategies to aid in auditory comprehension at home with supervision A.   Baseline:  Goal status: INITIAL   3.  Pt will comprehend  written choice f:3 at word level with known context 80% to make accurate selections of preferences, answer questions at home with occasional min A Baseline:  Goal status: INITIAL   4.  Pt will be intelligible in 2 word phrase production in 15/20 sentences with direct model and usual mod-A  Baseline:  Goal status: INITIAL   5.  Pt will complete augmentative communication/SGD canidacy assessment to determine appropriate communication device to support communication of wants/needs/thoughts Baseline:  Goal status: INITIAL     LONG TERM GOALS: Target date: 11/30/2022    Pt will use AAC/multimodal communicate to communicate choices, wants at home  with family and caregiver over 1 week Baseline:  Goal status: INITIAL   2.  Pt will name 5 items in personally relevant categories with occasional mod A over 2 sessions Baseline:  Goal status: INITIAL   3.  Pt will employ dysarthria strategies resulting in 50% intelligibility in self-generated words and phrases with usual mod-A Baseline:  Goal status: INITIAL   4.  Pt will participate in clinical swallow evaluation to assess for candidacy for upgraded diet textures and liquid consistencies PRN Baseline:  Goal status: INITIAL     ASSESSMENT:   CLINICAL IMPRESSION: Patient is a 71 y.o. M who was seen today for cognitive linguistic and dysphagia evaluation following CVA. Pt presenting with severe non-fluent aphasia, moderate dysarthria, oropharyngeal dysphagia, and cognitive  deficits in areas of attention, memory, executive functioning.  Significant expressive > receptive language deficits impacting overall communication abilities. Pt spouse reporting usual frustration at home, though are employing yes/no questioning to determine wants/needs. Pt evidences frustration with communication limitations this date: crying but insists on persisting through evaluation. Notable difficulties in communicating beyond answer y/n questions, unable to provide substantive response to any open ended question or choose from written or spoken FO2 or FO4. 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 Dysarthria characterized by reduced vocal intensity and imprecise articulation which results in intelligibility reduced to ~50% at single word level. Per MBSS, pt with oropharyngeal dysphagia, currently on nectar thickened liquids with purees at home. Dysphagia exercises or strategies are contraindicated at this time d/t difficulty evidenced with following directions and impaired receptive language and memory. Plan to monitor pt's diet tolerance, complete subsequent clinical swallow evaluation, and adjust dysphagia intervention or recommendations based on pt progress. Decreased working memory and attention evidenced throughout evaluation, requiring SLP to redirect pt with verbal cues. Severity of cognitive impairments difficult to assess 2/2 communication impairment. Pt benefiting from usual repetition, use of simple and direct language, visual and verbal cueing for participation in assessment. SLP recommends skilled ST intervention course to facilitate improved communication efficacy, reduce caregiver burden, and improve QoL.    OBJECTIVE IMPAIRMENTS include attention, memory, executive functioning, aphasia, dysarthria, and dysphagia. These impairments are limiting patient from ADLs/IADLs, effectively communicating at home and in community, and safety when swallowing. Factors affecting  potential to achieve goals and functional outcome are ability to learn/carryover information, co-morbidities, previous level of function, and financial resources. Patient will benefit from skilled SLP services to address above impairments and improve overall function.   REHAB POTENTIAL: Fair co-morbidities   PLAN: SLP FREQUENCY: 2x/week   SLP DURATION: 12 weeks   PLANNED INTERVENTIONS: Aspiration precaution training, Pharyngeal strengthening exercises, Diet toleration management , Language facilitation, Trials of upgraded texture/liquids, Cueing hierachy, Cognitive reorganization, Internal/external aids, Oral motor exercises, Functional tasks, Multimodal communication approach, SLP instruction and feedback, Compensatory strategies, Patient/family education, and Re-evaluation   Su Monks, CCC-SLP 09/25/2022, 10:16 AM

## 2022-09-28 ENCOUNTER — Inpatient Hospital Stay: Payer: Medicare HMO | Admitting: Adult Health

## 2022-10-01 ENCOUNTER — Encounter: Payer: Self-pay | Admitting: Occupational Therapy

## 2022-10-01 ENCOUNTER — Ambulatory Visit: Payer: Medicare HMO | Admitting: Speech Pathology

## 2022-10-01 ENCOUNTER — Ambulatory Visit: Payer: Medicare HMO | Admitting: Occupational Therapy

## 2022-10-01 ENCOUNTER — Ambulatory Visit: Payer: Medicare HMO | Admitting: Physical Therapy

## 2022-10-01 DIAGNOSIS — R2689 Other abnormalities of gait and mobility: Secondary | ICD-10-CM

## 2022-10-01 DIAGNOSIS — R41840 Attention and concentration deficit: Secondary | ICD-10-CM

## 2022-10-01 DIAGNOSIS — I69318 Other symptoms and signs involving cognitive functions following cerebral infarction: Secondary | ICD-10-CM

## 2022-10-01 DIAGNOSIS — R41844 Frontal lobe and executive function deficit: Secondary | ICD-10-CM

## 2022-10-01 DIAGNOSIS — R2681 Unsteadiness on feet: Secondary | ICD-10-CM

## 2022-10-01 DIAGNOSIS — R482 Apraxia: Secondary | ICD-10-CM

## 2022-10-01 DIAGNOSIS — R278 Other lack of coordination: Secondary | ICD-10-CM

## 2022-10-01 DIAGNOSIS — R41842 Visuospatial deficit: Secondary | ICD-10-CM

## 2022-10-01 DIAGNOSIS — R41841 Cognitive communication deficit: Secondary | ICD-10-CM

## 2022-10-01 DIAGNOSIS — R4701 Aphasia: Secondary | ICD-10-CM | POA: Diagnosis not present

## 2022-10-01 DIAGNOSIS — I69351 Hemiplegia and hemiparesis following cerebral infarction affecting right dominant side: Secondary | ICD-10-CM

## 2022-10-01 NOTE — Therapy (Signed)
OUTPATIENT PHYSICAL THERAPY NEURO TREATMENT   Patient Name: Dustin Blanchard MRN: [id number] DOB:[birth year], 71 y.o., male Today's Date: 10/01/2022   PCP: Dr. Loletha Grayer at [facility] PROVIDER: Barbie Banner, PA-C    PT End of Session - 10/01/22 1406     Visit Number 5    Number of Visits 17   with eval   Date for PT Re-Evaluation 11/23/22   to allow for scheduling delays   Authorization Type [employer]    Progress Note Due on Visit 10    PT Start Time 1405   Handoff w/OT   PT Stop Time 1445    PT Time Calculation (min) 40 min    Equipment Utilized During Treatment Gait belt    Activity Tolerance Patient tolerated treatment well    Behavior During Therapy [facility] for tasks assessed/performed;Flat affect;Impulsive                Past Medical History:  Diagnosis Date   Dementia [facility])    Past Surgical History:  Procedure Laterality Date   HERNIA REPAIR     HIP SURGERY     IR CT HEAD LTD  08/06/2022   IR CT HEAD LTD  08/06/2022   IR PERCUTANEOUS ART THROMBECTOMY/INFUSION INTRACRANIAL INC DIAG ANGIO  08/06/2022   IR US GUIDE VASC ACCESS RIGHT  08/06/2022   RADIOLOGY WITH ANESTHESIA N/A 08/06/2022   Procedure: IR WITH ANESTHESIA;  Surgeon: Luanne Bras, MD;  Location: [place];  Service: Radiology;  Laterality: N/A;   Patient Active Problem List   Diagnosis Date Noted   Pneumonia of right lower lobe due to infectious organism    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   Sinus pause    Acute combined systolic and diastolic heart failure (HCC)    Atrial fibrillation with RVR (HCC)    Acute on chronic combined systolic and diastolic CHF (congestive heart failure) ([place])    Acute ischemic left MCA stroke ([place]) 08/06/2022    ONSET DATE: 08/26/2022   REFERRING DIAG: I63.9 (ICD-10-CM) - CVA (cerebral vascular accident) ([place])   THERAPY DIAG:  Apraxia  Other abnormalities of gait and mobility  Other lack of coordination  Rationale  for Evaluation and Treatment Rehabilitation  SUBJECTIVE:                                                                                                                                                                                              SUBJECTIVE STATEMENT: [name] reports no new changes, has not found pt's cane this date. No falls. Wife reports pt began to make the bed  unprompted yesterday.   Pt accompanied by: significant other wife Dustin Blanchard  PERTINENT HISTORY: hypertension, hyperlipidemia, sinus bradycardia/chronic CHF with mild regurgitation, BPH, history of tobacco use, vascular dementia diagnosed 2022   PAIN:  Are you having pain? No  PRECAUTIONS: Fall and Other: aphasia, dysphagia 1 (nectar-thick)  WEIGHT BEARING RESTRICTIONS No  FALLS: Has patient fallen in last 6 months? Yes. Number of falls one fall when he had the stroke, no other falls  PLOF: Needs assistance with gait, Needs assistance with transfers, and uses RW in the home with Supervision, uses transport chair for community mobility  PATIENT GOALS "to get him back to moving and walking better"  OBJECTIVE:   TODAY'S TREATMENT:  NMR  Pt ambulated throughout session without AD and CGA.  The following were performed for dynamic standing balance, reaching out of BOS and attention to R side: In ADL kitchen, placed laundry basket on L side (on washer) and unfolded clothes on R side (on dryer) and provided simple cue for pt to fold. Pt able to fold all laundry from R side and place in basket on L side without UE support. Pt then able to take folded laundry from basket and place on top of dryer without cues.  With two therapists Vania Rea and [place]), standing ball tosses using purple ball to facilitate lateral reach, full hip extension and weight shift to R side. Pt unable to follow visual cues for performing dribble pass or for hitting ball back to [place]. However, pt able to shift weight over to R side when ball placed  superolaterally to R side. CGA throughout for safety Walking ball tosses w/2 therapists, w/emphasis on tossing to R side. Pt frequently attempting to dribble ball rather than throw ball but had no instability throughout.  Attempted to have pt walk while tossing ball by himself, but pt unable to perform 2/2 apraxia.     Gait pattern: step through pattern, decreased arm swing- Right, decreased arm swing- Left, decreased stride length, decreased hip/knee flexion- Right, decreased hip/knee flexion- Left, decreased trunk rotation, trunk flexed, and narrow BOS Distance walked: >300' outside on sidewalk and various clinic distances Assistive device utilized: None Level of assistance: CGA Comments: Ambulated around various rooms in clinic (kitchen, waiting room) and challenged pt to navigate common pieces of furniture on R side. Pt navigated kitchen well but requires tactile cues for upright posture to facilitate visual scanning on R side, as pt exhibits forward flexed posture and downward gaze. Tasked pt by having him locate doors from clinic to front entrance of building, in which therapist had to point for pt to attend to doors on R side. Once outside, pt able to follow sidewalk well but would not attend to surroundings. Pt frequently walking around his RW rather than use it and noted use of RW facilitates forward flexed posture despite being properly adjusted. Will continue to ambulate without AD in clinic to determine if this is safer for pt due to poor management of AD.    PATIENT EDUCATION: Education details: Continue HEP, encouraged wife to allow pt to perform daily tasks at home (per recommendation of OT), such as dishes, folding clothes and laundry  Person educated: Patient and Spouse Education method: Customer service manager Education comprehension: verbalized understanding, returned demonstration, verbal cues required, and needs further education   HOME EXERCISE PROGRAM: Access Code:  [email or web address] URL: [email or web address] Date: 09/17/2022 Prepared by: Excell Seltzer  Exercises - Side Stepping with Counter Support  - 1 x daily - 7 x weekly -  1 sets - 5 reps - Tandem Walking with Counter Support  - 1 x daily - 7 x weekly - 1 sets - 5 reps - Alternating Step Taps with Counter Support  - 1 x daily - 7 x weekly - 2 sets - 10 reps   GOALS: Goals reviewed with patient? Yes  SHORT TERM GOALS: Target date: 10/12/2022  Pt will be independent with initial HEP for improved strength, balance, transfers and gait.  Baseline: Goal status: INITIAL  2.  Pt will improve gait velocity to at least 2.0 ft/sec for improved gait efficiency and performance at Supervision level   Baseline: 1.82 ft/sec (9/25) Goal status: INITIAL  3.  Pt will improve normal TUG to less than or equal to 30 seconds for improved functional mobility and decreased fall risk.  Baseline: 40.43 sec (9/25) Goal status: INITIAL  4.  Pt will improve Berg score to 30/56 for decreased fall risk  Baseline: 26/56 (9/25) Goal status: INITIAL  5.  Pt will improve 5 x STS to less than or equal to 30 seconds to demonstrate improved functional strength and transfer efficiency.   Baseline: 34.62 sec (9/25) Goal status: INITIAL   LONG TERM GOALS: Target date: 11/09/2022  Pt will be independent with final HEP for improved strength, balance, transfers and gait.  Baseline:  Goal status: INITIAL  2.  Pt will improve gait velocity to at least 2.25 ft/sec for improved gait efficiency and performance at Supervision level   Baseline: 1.82 ft/sec (9/25) Goal status: INITIAL  3.  Pt will improve normal TUG to less than or equal to 20 seconds for improved functional mobility and decreased fall risk.  Baseline: 40.43 sec (9/25) Goal status: INITIAL  4.  Pt will improve Berg score to 34/56 for decreased fall risk  Baseline: 26/56 (925) Goal status: INITIAL  5.  Pt will improve 5 x STS to less than  or equal to 25 seconds to demonstrate improved functional strength and transfer efficiency.   Baseline: 34.62 sec (9/25) Goal status: INITIAL   ASSESSMENT:  CLINICAL IMPRESSION: Emphasis of skilled PT session on dynamic standing balance, attention to R side and lateral reaching. Pt continues to be limited by R inattention and apraxia but performed laundry task well with no cues required. Pt very motivated by basketball and was able to shift weight onto R side w/truncal elongation w/use of ball. Pt responds well to tactile cues and very few use of words. Noted pt ambulated around his walker, as if it is an obstacle, and had difficulty maintaining proper posture and midline orientation. Pt may be safer without use of RW at this time. Continue POC.     OBJECTIVE IMPAIRMENTS Abnormal gait, decreased balance, decreased cognition, decreased knowledge of condition, decreased knowledge of use of DME, decreased mobility, decreased strength, decreased safety awareness, and impaired vision/preception.   ACTIVITY LIMITATIONS carrying, lifting, bending, standing, squatting, stairs, and transfers  PARTICIPATION LIMITATIONS: meal prep, cleaning, laundry, medication management, interpersonal relationship, driving, shopping, community activity, and yard work  PERSONAL FACTORS 1-2 comorbidities:    PMH that includes:  hypertension, hyperlipidemia, sinus bradycardia/chronic CHF with mild regurgitation, BPH, history of tobacco use, vascular dementia diagnosed 2022 are also affecting patient's functional outcome.   REHAB POTENTIAL: Fair history of dementia and severity of R neglect/inattention  CLINICAL DECISION MAKING: Stable/uncomplicated  EVALUATION COMPLEXITY: Moderate  PLAN: PT FREQUENCY: 2x/week  PT DURATION: 8 weeks  PLANNED INTERVENTIONS: Therapeutic exercises, Therapeutic activity, Neuromuscular re-education, Balance training, Gait training, Patient/Family education, Self  Care, Joint  mobilization, Stair training, Vestibular training, Canalith repositioning, Visual/preceptual remediation/compensation, Orthotic/Fit training, DME instructions, Aquatic Therapy, Dry Needling, Cognitive remediation, Electrical stimulation, Wheelchair mobility training, Cryotherapy, Moist heat, Taping, Manual therapy, and Re-evaluation  PLAN FOR NEXT SESSION: Seated R inattention tasks. add to HEP for balance and R attention, work on [employer], visual scanning to the R and attention to R side, safety with transfers and mobility with LRAD, enjoys basketball so incorporating that or other sports into treatment session, sweeping? Golf?    Cruzita Lederer Fredis Malkiewicz, PT, DPT 10/01/2022, 3:33 PM

## 2022-10-01 NOTE — Therapy (Signed)
OUTPATIENT OCCUPATIONAL THERAPY NEURO EVALUATION  Patient Name: Dustin Blanchard MRN: [id number] DOB:Nov 26, 1951, 71 y.o., male Today's Date: 10/01/2022  PCP: [facility] PROVIDER: Dr. Leeroy Cha   OT End of Session - 10/01/22 1322     Visit Number 4    Number of Visits 25    Date for OT Re-Evaluation 12/06/22    Authorization Type [employer] insurance verifications    Authorization - Visit Number 4    Authorization - Number of Visits 10    Progress Note Due on Visit 10    OT Start Time 1316    OT Stop Time 1400    OT Time Calculation (min) 44 min    Activity Tolerance Patient tolerated treatment well    Behavior During Therapy [facility] for tasks assessed/performed               Past Medical History:  Diagnosis Date   Dementia [facility])    Past Surgical History:  Procedure Laterality Date   HERNIA REPAIR     HIP SURGERY     IR CT HEAD LTD  08/06/2022   IR CT HEAD LTD  08/06/2022   IR PERCUTANEOUS ART THROMBECTOMY/INFUSION INTRACRANIAL INC DIAG ANGIO  08/06/2022   IR US GUIDE VASC ACCESS RIGHT  08/06/2022   RADIOLOGY WITH ANESTHESIA N/A 08/06/2022   Procedure: IR WITH ANESTHESIA;  Surgeon: Luanne Bras, MD;  Location: [place];  Service: Radiology;  Laterality: N/A;   Patient Active Problem List   Diagnosis Date Noted   Pneumonia of right lower lobe due to infectious organism    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   Sinus pause    Acute combined systolic and diastolic heart failure (HCC)    Atrial fibrillation with RVR (HCC)    Acute on chronic combined systolic and diastolic CHF (congestive heart failure) ([place])    Acute ischemic left MCA stroke ([place]) 08/06/2022    ONSET DATE: 08/06/22  REFERRING DIAG: I63.9 (ICD-10-CM) - CVA (cerebral vascular accident) ([place])   THERAPY DIAG:  Other lack of coordination  Unsteadiness on feet  Hemiplegia and hemiparesis following cerebral infarction affecting right dominant  side (HCC)  Other symptoms and signs involving cognitive functions following cerebral infarction  Frontal lobe and executive function deficit  Attention and concentration deficit  Visuospatial deficit  Rationale for Evaluation and Treatment Rehabilitation  SUBJECTIVE:   SUBJECTIVE STATEMENT: Tried sorting coins at home per wife, but difficult.  Pt did attempt to do some vocalizations (affirmative) today and continues to try to shake head/nod.  Pt accompanied by: significant other    PERTINENT HISTORY: Presented to hospital 08/06/2022 with acute onset of right-sided weakness and aphasia. Cranial CT scan showed small age-indeterminate left PCA infarct of the left occipital lobe.  Patient did receive TNK as well as endovascular revascularization of occlusion of left M3 MCA middle division branch 08/06/2022 per interventional radiology. In hospital, cardiology service consulted for new A-fib RVR/CHF.    PMH:  hypertension hyperlipidemia sinus bradycardia/chronic CHF with mild regurgitation BPH history of tobacco use vascular dementia diagnosed 2022  PRECAUTIONS: Fall and Other: aphasia (particularly expressive), Dysphagia #1 nectar thick liquids   WEIGHT BEARING RESTRICTIONS No  PAIN:  Are you having pain? No  FALLS: Has patient fallen in last 6 months? Yes. Number of falls morning of CVA  LIVING ENVIRONMENT: Lives with: lives with their spouse Lives in: House/apartment Stairs: Yes: External: 4 steps; bilateral Has following equipment at home: Gilford Rile - 2 wheeled, Electronics engineer,  bed side commode, and transport w/c, handheld shower head  PLOF: Independent, Vocation/Vocational requirements: machine shop, small-scale farming, and Leisure: gardening, repairs around the house   PATIENT GOALS be more independent, use RUE more, attend to R side more.  OBJECTIVE:   HAND DOMINANCE: Right   TODAY'S TREATMENT:   10/01/22 Patient here with his wife.  Patient walking with RW with  frequent collision with obstacles on right - initially had difficulty recognizing problem  With time, patient able to ID obstacle and navigate around.   Worked on [employer] between field of two.  Patient needed overt cueing for all visual discrimination to right of midline.   Worked on automatic functional use of RUE- Cutting with scissors. Patient able to cut on straight line, but needed intermittent cueing for directional changes - e.g. cutting out a square.   Patient needed increased time to orient hand to scissors, grip, and paper - but did so without cueing.   Discussed with wife benefit of having him participate in [place], familiar, functional tasks at home - e.g. making the bed, washing dishes.      PATIENT EDUCATION: Education details: Use of tactile, auditory sound, and visual cueing or simple verbal cueing for improved performance; recommended wife sit on R side of pt and place objects on R side to encourage incr attention and visual scanning to the R side.    Person educated: Patient and Spouse Education method: Explanation, Demonstration, Tactile cues, and Verbal cues Education comprehension: verbalized understanding, returned demonstration, verbal cues required, tactile cues required, and needs further education   HOME EXERCISE PROGRAM: 09/14/22:  RUE and bilateral hand functional use HEP     GOALS: Potential Goals reviewed with patient? Yes  SHORT TERM GOALS: Target date: 10/06/22  Pt/caregiver will be independent with HEP for RUE functional use/coordination.   Goal status: INITIAL  2.  Pt/caregiver will verbalize understanding of visual compensation strategies and visual HEP. Goal status: INITIAL  3.  Pt will perform simple tabletop visual scanning with at least 85% accuracy. Goal status: INITIAL  4.  Pt will improve R grip strength to at least 25lbs for incr functional use. Baseline:  17.1lbs Goal status: INITIAL  5.  Pt will be able to consistently perform  bilateral UE tasks with no more than min cueing (such as zipping jacket, using pump of hand sanitizer, folding towels, etc). Goal status: INITIAL    LONG TERM GOALS: Target date: 12/06/22  Pt/caregiver will be independent with HEP for RUE functional use/strength.  Goal status: INITIAL  2.  Pt/caregiver will verbalize understanding of strategies to incr safety/ease/participation with ADLs/IADLs. Goal status: INITIAL  3.  Pt will perform simple environmental scanning with at least 85% accuracy for incr safety. Goal status: INITIAL  4.  Pt will improve RUE coordination/functional use and cognition as shown by improving score on box and blocks to 15 with RUE. Baseline: 5 blocks Goal status: INITIAL  5.  Pt will use RUE as dominant UE for functional tasks at least 90% of the time. Goal status: INITIAL  6.  Pt will be able to perform BADLs with no more than distant supervision. Goal status: INITIAL  ASSESSMENT:  CLINICAL IMPRESSION: Patient showing improved ability to use RUE spontaneously, however limited by apraxia, right inattention/ visual deficits, and language deficits.     PERFORMANCE DEFICITS in functional skills including ADLs, IADLs, coordination, dexterity, proprioception, strength, FMC, GMC, mobility, balance, decreased knowledge of precautions, decreased knowledge of use of DME, vision, and UE  functional use, cognitive skills including attention, learn, memory, perception, problem solving, safety awareness, and understand, and psychosocial skills including environmental adaptation, habits, and routines and behaviors.   IMPAIRMENTS are limiting patient from ADLs, IADLs, and leisure.   COMORBIDITIES has co-morbidities such as vascular dementia  that affects occupational performance. Patient will benefit from skilled OT to address above impairments and improve overall function.  MODIFICATION OR ASSISTANCE TO COMPLETE EVALUATION: Min-Moderate modification of tasks or assist  with assess necessary to complete an evaluation.  OT OCCUPATIONAL PROFILE AND HISTORY: Detailed assessment: Review of records and additional review of physical, cognitive, psychosocial history related to current functional performance.  CLINICAL DECISION MAKING: Moderate - several treatment options, min-mod task modification necessary  REHAB POTENTIAL: Good  EVALUATION COMPLEXITY: Moderate    PLAN: OT FREQUENCY: 2x/week  OT DURATION: 12 weeks +eval  PLANNED INTERVENTIONS: self care/ADL training, therapeutic exercise, therapeutic activity, neuromuscular re-education, passive range of motion, balance training, functional mobility training, fluidotherapy, moist heat, cryotherapy, patient/family education, cognitive remediation/compensation, visual/perceptual remediation/compensation, energy conservation, coping strategies training, and DME and/or AE instructions  RECOMMENDED OTHER SERVICES: current with Speech therapy, needs to schedule PT (referral in Epic)  CONSULTED AND AGREED WITH PLAN OF CARE: Patient and family member/caregiver  PLAN FOR NEXT SESSION:  review HEP prn, RUE functional use, simple visual scanning   Mariah Milling, OTR/L 10/01/2022, 2:17 PM

## 2022-10-01 NOTE — Therapy (Signed)
OUTPATIENT SPEECH LANGUAGE PATHOLOGY TREATMENT NOTE   Patient Name: Dustin Blanchard MRN: [id number] DOB:November 06, 1951, 71 y.o., male Today's Date: 10/01/2022  PCP: [facility]  REFERRING PROVIDER: Milinda Antis, PA-C   END OF SESSION:   End of Session - 10/01/22 1533     Visit Number 6    Number of Visits 25    Date for SLP Re-Evaluation 11/30/22    Authorization Type [employer]    Progress Note Due on Visit 10    SLP Start Time 1533    SLP Stop Time  1615    SLP Time Calculation (min) 42 min    Activity Tolerance Patient tolerated treatment well;Other (comment)   poor frsutration tolerance              Past Medical History:  Diagnosis Date   Dementia [facility])    Past Surgical History:  Procedure Laterality Date   HERNIA REPAIR     HIP SURGERY     IR CT HEAD LTD  08/06/2022   IR CT HEAD LTD  08/06/2022   IR PERCUTANEOUS ART THROMBECTOMY/INFUSION INTRACRANIAL INC DIAG ANGIO  08/06/2022   IR US GUIDE VASC ACCESS RIGHT  08/06/2022   RADIOLOGY WITH ANESTHESIA N/A 08/06/2022   Procedure: IR WITH ANESTHESIA;  Surgeon: Julieanne Cotton, MD;  Location: [facility] OR;  Service: Radiology;  Laterality: N/A;   Patient Active Problem List   Diagnosis Date Noted   Pneumonia of right lower lobe due to infectious organism    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 No  OBJECTIVE:   TODAY'S  TREATMENT:  10-01-22: Trial thin liquid bolus trials today, from cup with pt self-feeding. Intermittent throat cleats but overall appears to have handled bolus trials well. Was unable to complete sequential sips. Continues to have barriers to exercises or implementation of swallow compensations 2/2 receptive language impairment and cognitive impairment. SLP recommends consideration of instrumental swallow study to objectively assess pt's swallow function.  Target naming and auditory comprehension of familiar objects. Pt able to repeat name of all objects with max-A. ID correct object from FO2 6/6 trials; ID correct object from [facility] 6/6 trials. ID correct object from FO4 3/6 trials. Following, pt able to name 12/18 objects given phonemic cues.   09-25-22: Target receptive ID of everyday objects from written FO2 with max support from SLP. SLP ensured pt read options first, providing verbal model when needed with pt repeating. Increased success with attending to both options when written vertically to accommodate for R visual cut. Pt with 70% accuracy from FO2 without semantic pairs. Given FO3, accuracy decreased to 20%. Accuracy decreased with semantically related options to 40% accuracy. Repeating item names after SLP model 90% of trials, with usual cues for increasing vocal intensity. Y/N ? 100% accuracy. Provided education to spouse on facilitative strategies demonstrated during today's session, FO2 vertically, usual repetition, having pt look at mouth for verbal production.  09-23-22: Target verbal expression through automatic speech tasks. Able to count to 10 and say ABCs with reduced intelligibility with choral production and visual aid. Pt with awareness of paraphasias and perseveration, resulting in crying. Target receptive language through Y/N questions, with pt achieving 70% accuracy with usual repetitions and use of direct, simple language. Target ID of objects from FO2 with written and verbal  presentation. With max-A, pt achieved 50% accuracy, with occasion of not selecting any answer (tapping the table around the stimuli). With verbal model and max cueing, pt repeated named objects in 40% of trials. Reduced attention, with pt easily distracted this date, requires verbal cues to reorient to task. Pt with overt frustration, crying, numerous times throughout session.   09-17-22: Assessing for possible AAC system and targeted reading and auditory comprehension . With max A verbal  (me saying the written word) and visual cues and cues to look right, [name] ID object F:2 to written word 50% accuracy. He ID'd family to written paired with spoken name in photo F:2 and group photo F:10, with max A 30% accuracy. He matched object to black and white photo 1/5x with consistent max A to attend to right, point to object. [name] repeated names of objects 6/10x with max A and phrase completion. Repeated family names with usual mod A 5/5. Instructed and provided feedback to spouse on cueing with short, direct phrases instead of lengthy instructions which result in reduced attention to task due to processing. HW is to ID family photos to names and sort pennies and quarters to facilitate matching for possible AAC.  09-14-22: Generated list of 18 family names - [name] repeated family names and personally relevant words (yard tools). With frequent max A [name] repeated in unison with ST 6 family names. With written names choice of 2 and consistent max A, [name] ID'd correct name choice to photo of grandson 2/3x. With choice of 2 written names, cues to look right and max A, [name] ID'd spouses name 2/4x. With choice of 2 written words of yard tools vs foil, [name] ID'd yard tool 2/4x. With this demonstration and verbal and written instruction, trained wife to provide large printed choice of 2 words to ID in context, providing example of helping him select the correct meat or drink for dinner or correct family name choice of 2 to a  photo. Ongoing instruction required to ascertain if [name] can make choice of 2 words for AAC. [name] repeated asked [name] if he was cold. Initiated training of gestures for cold, hot, thirsty with [name] imitating my gesture 2/5 attempts for each. Instructed [name] to have her son help her get photos of family and [name]'s tools on her phone for naming tasks. Automatic speech: [name] completed 8/10 idioms with usual repetition of phrase. Frequent ongoing cues to keep head up, look at me and look right to ID written words   PATIENT EDUCATION: Education details: see above Person educated: Patient and Spouse Education method: Explanation, Demonstration, and Handouts Education comprehension: verbalized understanding, returned demonstration, and needs further education   GOALS: Goals reviewed with patient? Yes   SHORT TERM GOALS: Target date: 10/12/2022   Pt will average 80% accuracy in practical naming tasks with usual mod-A over 2 sessions.  Baseline: Goal status: INITIAL   2.  Spouse will teach back supportive communication strategies to aid in auditory comprehension at home with supervision A.   Baseline:  Goal status: INITIAL   3.  Pt will comprehend  written choice f:3 at  word level with known context 80% to make accurate selections of preferences, answer questions at home with occasional min A Baseline:  Goal status: INITIAL   4.  Pt will be intelligible in 2 word phrase production in 15/20 sentences with direct model and usual mod-A  Baseline:  Goal status: INITIAL   5.  Pt will complete augmentative communication/SGD canidacy assessment to determine appropriate communication device to support communication of wants/needs/thoughts Baseline:  Goal status: INITIAL     LONG TERM GOALS: Target date: 11/30/2022    Pt will use AAC/multimodal communicate to communicate choices, wants at home with family and caregiver over 1 week Baseline:  Goal status: INITIAL   2.  Pt will name 5 items in  personally relevant categories with occasional mod A over 2 sessions Baseline:  Goal status: INITIAL   3.  Pt will employ dysarthria strategies resulting in 50% intelligibility in self-generated words and phrases with usual mod-A Baseline:  Goal status: INITIAL   4.  Pt will participate in clinical swallow evaluation to assess for candidacy for upgraded diet textures and liquid consistencies PRN Baseline:  Goal status: INITIAL     ASSESSMENT:   CLINICAL IMPRESSION: Patient is a 71 y.o. M who was seen today for cognitive linguistic and dysphagia evaluation following CVA. Pt presenting with severe non-fluent aphasia, moderate dysarthria, oropharyngeal dysphagia, and cognitive deficits in areas of attention, memory, executive functioning.  Significant expressive > receptive language deficits impacting overall communication abilities. Pt spouse reporting usual frustration at home, though are employing yes/no questioning to determine wants/needs. Pt evidences frustration with communication limitations this date: crying but insists on persisting through evaluation. Notable difficulties in communicating beyond answer y/n questions, unable to provide substantive response to any open ended question or choose from written or spoken FO2 or FO4. 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 Dysarthria characterized by reduced vocal intensity and imprecise articulation which results in intelligibility reduced to ~50% at single word level. Per MBSS, pt with oropharyngeal dysphagia, currently on nectar thickened liquids with purees at home. Dysphagia exercises or strategies are contraindicated at this time d/t difficulty evidenced with following directions and impaired receptive language and memory. Plan to monitor pt's diet tolerance, complete subsequent clinical swallow evaluation, and adjust dysphagia intervention or recommendations based on pt progress. Decreased working memory and attention  evidenced throughout evaluation, requiring SLP to redirect pt with verbal cues. Severity of cognitive impairments difficult to assess 2/2 communication impairment. Pt benefiting from usual repetition, use of simple and direct language, visual and verbal cueing for participation in assessment. SLP recommends skilled ST intervention course to facilitate improved communication efficacy, reduce caregiver burden, and improve QoL.    OBJECTIVE IMPAIRMENTS include attention, memory, executive functioning, aphasia, dysarthria, and dysphagia. These impairments are limiting patient from ADLs/IADLs, effectively communicating at home and in community, and safety when swallowing. Factors affecting potential to achieve goals and functional outcome are ability to learn/carryover information, co-morbidities, previous level of function, and financial resources. Patient will benefit from skilled SLP services to address above impairments and improve overall function.   REHAB POTENTIAL: Fair co-morbidities   PLAN: SLP FREQUENCY: 2x/week   SLP DURATION: 12 weeks   PLANNED INTERVENTIONS: Aspiration precaution training, Pharyngeal strengthening exercises, Diet toleration management , Language facilitation, Trials of upgraded texture/liquids, Cueing hierachy, Cognitive reorganization, Internal/external aids, Oral motor exercises, Functional tasks, Multimodal communication approach, SLP instruction and feedback, Compensatory strategies, Patient/family education, and Re-evaluation   Maia Breslow, CCC-SLP 10/01/2022, 3:33 PM

## 2022-10-02 ENCOUNTER — Ambulatory Visit: Payer: Medicare HMO | Admitting: Physical Therapy

## 2022-10-02 ENCOUNTER — Ambulatory Visit: Payer: Medicare HMO | Admitting: Speech Pathology

## 2022-10-02 DIAGNOSIS — R278 Other lack of coordination: Secondary | ICD-10-CM

## 2022-10-02 DIAGNOSIS — R4701 Aphasia: Secondary | ICD-10-CM

## 2022-10-02 DIAGNOSIS — R41841 Cognitive communication deficit: Secondary | ICD-10-CM

## 2022-10-02 DIAGNOSIS — R2689 Other abnormalities of gait and mobility: Secondary | ICD-10-CM

## 2022-10-02 DIAGNOSIS — R482 Apraxia: Secondary | ICD-10-CM

## 2022-10-02 NOTE — Therapy (Signed)
OUTPATIENT PHYSICAL THERAPY NEURO TREATMENT   Patient Name: Dustin Blanchard MRN: [id number] DOB:1951-05-15, 71 y.o., male Today's Date: 10/02/2022   PCP: Dr. Loletha Grayer at [facility] PROVIDER: Barbie Banner, PA-C    PT End of Session - 10/02/22 1104     Visit Number 6    Number of Visits 17   with eval   Date for PT Re-Evaluation 11/23/22   to allow for scheduling delays   Authorization Type [employer]    Progress Note Due on Visit 10    PT Start Time 1103   Handoff w/SLP   PT Stop Time 1143    PT Time Calculation (min) 40 min    Equipment Utilized During Treatment Gait belt    Activity Tolerance Patient tolerated treatment well    Behavior During Therapy [facility] for tasks assessed/performed;Flat affect;Impulsive                 Past Medical History:  Diagnosis Date   Dementia [facility])    Past Surgical History:  Procedure Laterality Date   HERNIA REPAIR     HIP SURGERY     IR CT HEAD LTD  08/06/2022   IR CT HEAD LTD  08/06/2022   IR PERCUTANEOUS ART THROMBECTOMY/INFUSION INTRACRANIAL INC DIAG ANGIO  08/06/2022   IR US GUIDE VASC ACCESS RIGHT  08/06/2022   RADIOLOGY WITH ANESTHESIA N/A 08/06/2022   Procedure: IR WITH ANESTHESIA;  Surgeon: Luanne Bras, MD;  Location: [place];  Service: Radiology;  Laterality: N/A;   Patient Active Problem List   Diagnosis Date Noted   Pneumonia of right lower lobe due to infectious organism    Left middle cerebral artery stroke ([place]) 08/12/2022   Sinus pause    Acute combined systolic and diastolic heart failure (HCC)    Atrial fibrillation with RVR (HCC)    Acute on chronic combined systolic and diastolic CHF (congestive heart failure) ([place])    Acute ischemic left MCA stroke ([place]) 08/06/2022    ONSET DATE: 08/26/2022   REFERRING DIAG: I63.9 (ICD-10-CM) - CVA (cerebral vascular accident) ([place])   THERAPY DIAG:  Apraxia  Other abnormalities of gait and mobility  Other lack of  coordination  Rationale for Evaluation and Treatment Rehabilitation  SUBJECTIVE:                                                                                                                                                                                              SUBJECTIVE STATEMENT: [name] reports no new changes, states the pt is doing his exercises at home but she modified them to perform outside.  No falls   Pt accompanied by: significant other wife [name]  PERTINENT HISTORY: hypertension, hyperlipidemia, sinus bradycardia/chronic CHF with mild regurgitation, BPH, history of tobacco use, vascular dementia diagnosed 2022   PAIN:  Are you having pain? No (pt able to verbalize no)   PRECAUTIONS: Fall and Other: aphasia, dysphagia 1 (nectar-thick)  WEIGHT BEARING RESTRICTIONS No  FALLS: Has patient fallen in last 6 months? Yes. Number of falls one fall when he had the stroke, no other falls  PLOF: Needs assistance with gait, Needs assistance with transfers, and uses RW in the home with Supervision, uses transport chair for community mobility  PATIENT GOALS "to get him back to moving and walking better"  OBJECTIVE:   TODAY'S TREATMENT:  NMR  Pt ambulated throughout session without AD and SBA-CGA. Educated pt and wife on discontinuing use of RW at home due to it being more of a safety risk due to pt hitting obstacles with it or walking around it. Pt and wife verbalized understanding  Walking w/balloon taps x200' for improved visual scanning, dual-tasking and R attention. Pt demonstrated shuffle gait pattern and hypometric taps, but no LOB noted throughout. Min visual cues for "large taps" but pt unable to follow. SBA throughout  Walking balloon taps with second therapist, [phone]' for continues visual scanning and R attention. Pt required min tactile cues at pelvis to continue walking during task and cotinued to demonstrate slow, shuffled gait pattern w/forward flexed posture.  However, pt able to track balloon to R side consistently and tap balloon w/RUE. CGA throughout  Set up cornhole game for improved lateral weight, anterior weight shift and attention to R side:  First round played in seated position w/basket of beanbags placed anterolaterally to R side to prime movement of grabbing bag on R side and throwing to board. Pt initially had difficulty locating the basket between each throw and tended to check L side first, but quickly was able to check on R side first each time  Second round played in standing w/therapist standing on mat behind pt and having pt reach to grab beanbags at various heights posterolaterally on L and R side. Pt responded well to hearing the beanbag rustle to determine which side to turn and look towards. Min cues when beanbag was placed superolaterally to R side, as pt does not scan R upper quadrant of visual field.  Final round played in standing w/basket of beanbags placed in various positions to R of pt. Pt able to turn and locate basket each time, with extra time required when basket out of immediate reach. SBA throughout [employer].    Gait pattern: step through pattern, decreased arm swing- Right, decreased arm swing- Left, decreased stride length, decreased hip/knee flexion- Right, decreased hip/knee flexion- Left, decreased trunk rotation, trunk flexed, and narrow BOS Distance walked: various clinic distances Assistive device utilized: None Level of assistance: SBA Comments: Pt demonstrates improved navigation and avoidance of obstacles on R side this date    PATIENT EDUCATION: Education details: Continue HEP,discontinue use of RW at home  Person educated: Patient and Spouse Education method: Customer service manager Education comprehension: verbalized understanding, returned demonstration, verbal cues required, and needs further education   HOME EXERCISE PROGRAM: Access Code: [email or web address] URL:  [email or web address] Date: 09/17/2022 Prepared by: Excell Seltzer  Exercises - Side Stepping with Counter Support  - 1 x daily - 7 x weekly - 1 sets - 5 reps - Tandem Walking with Counter Support  - 1 x daily - 7 x  weekly - 1 sets - 5 reps - Alternating Step Taps with Counter Support  - 1 x daily - 7 x weekly - 2 sets - 10 reps   GOALS: Goals reviewed with patient? Yes  SHORT TERM GOALS: Target date: 10/12/2022  Pt will be independent with initial HEP for improved strength, balance, transfers and gait.  Baseline: Goal status: INITIAL  2.  Pt will improve gait velocity to at least 2.0 ft/sec for improved gait efficiency and performance at Supervision level   Baseline: 1.82 ft/sec (9/25) Goal status: INITIAL  3.  Pt will improve normal TUG to less than or equal to 30 seconds for improved functional mobility and decreased fall risk.  Baseline: 40.43 sec (9/25) Goal status: INITIAL  4.  Pt will improve Berg score to 30/56 for decreased fall risk  Baseline: 26/56 (9/25) Goal status: INITIAL  5.  Pt will improve 5 x STS to less than or equal to 30 seconds to demonstrate improved functional strength and transfer efficiency.   Baseline: 34.62 sec (9/25) Goal status: INITIAL   LONG TERM GOALS: Target date: 11/09/2022  Pt will be independent with final HEP for improved strength, balance, transfers and gait.  Baseline:  Goal status: INITIAL  2.  Pt will improve gait velocity to at least 2.25 ft/sec for improved gait efficiency and performance at Supervision level   Baseline: 1.82 ft/sec (9/25) Goal status: INITIAL  3.  Pt will improve normal TUG to less than or equal to 20 seconds for improved functional mobility and decreased fall risk.  Baseline: 40.43 sec (9/25) Goal status: INITIAL  4.  Pt will improve Berg score to 34/56 for decreased fall risk  Baseline: 26/56 (925) Goal status: INITIAL  5.  Pt will improve 5 x STS to less than or equal to 25  seconds to demonstrate improved functional strength and transfer efficiency.   Baseline: 34.62 sec (9/25) Goal status: INITIAL   ASSESSMENT:  CLINICAL IMPRESSION: Emphasis of skilled PT session on R attention, visual scanning and reaching out of BOS. Pt demonstrates good balance without use of AD, so discontinued use of RW as he frequently trips over it. Pt able to perform tasks well when primed for the movement in seated position, but continues to require extra time when attending to upper quadrant of visual field. Continue POC.     OBJECTIVE IMPAIRMENTS Abnormal gait, decreased balance, decreased cognition, decreased knowledge of condition, decreased knowledge of use of DME, decreased mobility, decreased strength, decreased safety awareness, and impaired vision/preception.   ACTIVITY LIMITATIONS carrying, lifting, bending, standing, squatting, stairs, and transfers  PARTICIPATION LIMITATIONS: meal prep, cleaning, laundry, medication management, interpersonal relationship, driving, shopping, community activity, and yard work  PERSONAL FACTORS 1-2 comorbidities:    PMH that includes:  hypertension, hyperlipidemia, sinus bradycardia/chronic CHF with mild regurgitation, BPH, history of tobacco use, vascular dementia diagnosed 2022 are also affecting patient's functional outcome.   REHAB POTENTIAL: Fair history of dementia and severity of R neglect/inattention  CLINICAL DECISION MAKING: Stable/uncomplicated  EVALUATION COMPLEXITY: Moderate  PLAN: PT FREQUENCY: 2x/week  PT DURATION: 8 weeks  PLANNED INTERVENTIONS: Therapeutic exercises, Therapeutic activity, Neuromuscular re-education, Balance training, Gait training, Patient/Family education, Self Care, Joint mobilization, Stair training, Vestibular training, Canalith repositioning, Visual/preceptual remediation/compensation, Orthotic/Fit training, DME instructions, Aquatic Therapy, Dry Needling, Cognitive remediation, Electrical  stimulation, Wheelchair mobility training, Cryotherapy, Moist heat, Taping, Manual therapy, and Re-evaluation  PLAN FOR NEXT SESSION: Seated and standing R inattention tasks. add to HEP for balance and R attention, work  on R NMR, visual scanning to the R and attention to R side, safety with transfers and mobility with LRAD, enjoys basketball so incorporating that or other sports into treatment session, sweeping? Golf?    Cruzita Lederer Jaquelyn Sakamoto, PT, DPT 10/02/2022, 11:43 AM

## 2022-10-02 NOTE — Therapy (Signed)
OUTPATIENT SPEECH LANGUAGE PATHOLOGY TREATMENT NOTE   Patient Name: Dustin Blanchard MRN: [id number] DOB:12-28-1950, 71 y.o., male Today's Date: 10/02/2022  PCP: [facility] [place] PROVIDER: Barbie Banner, PA-C   END OF SESSION:   End of Session - 10/02/22 1014     Visit Number 7    Number of Visits 25    Date for SLP Re-Evaluation 11/30/22    Authorization Type [employer]na Medicare    Progress Note Due on Visit 10    SLP Start Time 1014    SLP Stop Time  1058    SLP Time Calculation (min) 44 min    Activity Tolerance Patient tolerated treatment well;Other (comment)   poor frsutration tolerance               Past Medical History:  Diagnosis Date   Dementia [facility])    Past Surgical History:  Procedure Laterality Date   HERNIA REPAIR     HIP SURGERY     IR CT HEAD LTD  08/06/2022   IR CT HEAD LTD  08/06/2022   IR PERCUTANEOUS ART THROMBECTOMY/INFUSION INTRACRANIAL INC DIAG ANGIO  08/06/2022   IR US GUIDE VASC ACCESS RIGHT  08/06/2022   RADIOLOGY WITH ANESTHESIA N/A 08/06/2022   Procedure: IR WITH ANESTHESIA;  Surgeon: Luanne Bras, MD;  Location: [facility];  Service: Radiology;  Laterality: N/A;   Patient Active Problem List   Diagnosis Date Noted   Pneumonia of right lower lobe due to infectious organism    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 No  OBJECTIVE:   TODAY'S  TREATMENT:  10-02-22: Increased frustration this date from previous sessio. Target response to personal ID questions. Pt able to verbalize name with and without verbal cues. Approximation of birthday with written cues and direct model. Verbalizes wife's name with visual A and direct model. Given picture representation of everyday objects, pt able to select correct actual object for FO4 5/6 trials. Given FO3 picture representations, pt able to ID correct object presented in 9/11 trials. Inattention appears to be barrier vs object discrimination abilities. Use of [place] S[employer] program to assess for AAC candidacy. Pt able to select appropriate verbally dictated icon from [place] with 90% accuracy. Incorrect selection d/t decreased technology proficiency at time but pt with verbal "no" and continued scanning for correct icon.   10-01-22: Trial thin liquid bolus trials today, from cup with pt self-feeding. Intermittent throat cleats but overall appears to have handled bolus trials well. Was unable to complete sequential sips. Continues to have barriers to exercises or implementation of swallow compensations 2/2 receptive language impairment and cognitive impairment. SLP recommends consideration of instrumental swallow study to objectively assess pt's swallow function.  Target naming and auditory comprehension of familiar objects. Pt able to repeat name of all objects with max-A. ID correct object from FO2 6/6 trials; ID correct object from [facility] 6/6 trials. ID correct object from [place] 3/6 trials.  Following, pt able to name 12/18 objects given phonemic cues.   09-25-22: Target receptive ID of everyday objects from written FO2 with max support from SLP. SLP ensured pt read options first, providing verbal model when needed with pt repeating. Increased success with attending to both options when written vertically to accommodate for R visual cut. Pt with 70% accuracy from FO2 without semantic pairs. Given FO3, accuracy decreased to  20%. Accuracy decreased with semantically related options to 40% accuracy. Repeating item names after SLP model 90% of trials, with usual cues for increasing vocal intensity. Y/N ? 100% accuracy. Provided education to spouse on facilitative strategies demonstrated during today's session, FO2 vertically, usual repetition, having pt look at mouth for verbal production.   09-23-22: Target verbal expression through automatic speech tasks. Able to count to 10 and say ABCs with reduced intelligibility with choral production and visual aid. Pt with awareness of paraphasias and perseveration, resulting in crying. Target receptive language through Y/N questions, with pt achieving 70% accuracy with usual repetitions and use of direct, simple language. Target ID of objects from FO2 with written and verbal presentation. With max-A, pt achieved 50% accuracy, with occasion of not selecting any answer (tapping the table around the stimuli). With verbal model and max cueing, pt repeated named objects in 40% of trials. Reduced attention, with pt easily distracted this date, requires verbal cues to reorient to task. Pt with overt frustration, crying, numerous times throughout session.   09-17-22: Assessing for possible AAC system and targeted reading and auditory comprehension . With max A verbal  (me saying the written word) and visual cues and cues to look right, [name] ID object F:2 to written word 50% accuracy. He ID'd family to written paired with spoken name in photo F:2 and group photo F:10, with max A 30% accuracy. He matched object to black and white photo 1/5x with consistent max A to attend to right, point to object. [name] repeated names of objects 6/10x with max A and phrase completion. Repeated family names with usual mod A 5/5. Instructed and provided feedback to spouse on cueing with short, direct phrases instead of lengthy instructions which result in reduced attention to task due to processing. HW is to ID family  photos to names and sort pennies and quarters to facilitate matching for possible AAC.  09-14-22: Generated list of 18 family names - [name] repeated family names and personally relevant words (yard tools). With frequent max A [name] repeated in unison with ST 6 family names. With written names choice of 2 and consistent max A, [name] ID'd correct name choice to photo of grandson 2/3x. With choice of 2 written names, cues to look right and max A, [name] ID'd spouses name 2/4x. With choice of 2 written words of yard tools vs foil, [name] ID'd yard tool 2/4x. With this demonstration and verbal and written instruction, trained wife to provide large printed choice of 2 words to ID in context, providing example of helping him select the correct meat or drink for dinner or correct family name choice of 2 to a photo. Ongoing instruction required to ascertain if [name] can make choice of 2 words for AAC. [name] repeated asked [name] if he was cold. Initiated training of gestures for cold, hot, thirsty with [name] imitating my gesture 2/5 attempts for each. Instructed [name] to have her son help her get photos of family and [name]'s tools on her phone for naming tasks. Automatic speech: [name] completed 8/10 idioms with usual repetition  of phrase. Frequent ongoing cues to keep head up, look at me and look right to ID written words   PATIENT EDUCATION: Education details: see above Person educated: Patient and Spouse Education method: Explanation, Demonstration, and Handouts Education comprehension: verbalized understanding, returned demonstration, and needs further education   GOALS: Goals reviewed with patient? Yes   SHORT TERM GOALS: Target date: 10/12/2022   Pt will average 80% accuracy in practical naming tasks with usual mod-A over 2 sessions.  Baseline: Goal status: INITIAL   2.  Spouse will teach back supportive communication strategies to aid in auditory comprehension at home with supervision A.   Baseline:  Goal status:  INITIAL   3.  Pt will comprehend  written choice f:3 at word level with known context 80% to make accurate selections of preferences, answer questions at home with occasional min A Baseline:  Goal status: INITIAL   4.  Pt will be intelligible in 2 word phrase production in 15/20 sentences with direct model and usual mod-A  Baseline:  Goal status: INITIAL   5.  Pt will complete augmentative communication/SGD canidacy assessment to determine appropriate communication device to support communication of wants/needs/thoughts Baseline:  Goal status: INITIAL     LONG TERM GOALS: Target date: 11/30/2022    Pt will use AAC/multimodal communicate to communicate choices, wants at home with family and caregiver over 1 week Baseline:  Goal status: INITIAL   2.  Pt will name 5 items in personally relevant categories with occasional mod A over 2 sessions Baseline:  Goal status: INITIAL   3.  Pt will employ dysarthria strategies resulting in 50% intelligibility in self-generated words and phrases with usual mod-A Baseline:  Goal status: INITIAL   4.  Pt will participate in clinical swallow evaluation to assess for candidacy for upgraded diet textures and liquid consistencies PRN Baseline:  Goal status: INITIAL     ASSESSMENT:   CLINICAL IMPRESSION: Patient is a 71 y.o. M who was seen today for cognitive linguistic and dysphagia evaluation following CVA. Pt presenting with severe non-fluent aphasia, moderate dysarthria, oropharyngeal dysphagia, and cognitive deficits in areas of attention, memory, executive functioning.  Significant expressive > receptive language deficits impacting overall communication abilities. Pt spouse reporting usual frustration at home, though are employing yes/no questioning to determine wants/needs. Pt evidences frustration with communication limitations this date: crying but insists on persisting through evaluation. Notable difficulties in communicating beyond answer  y/n questions, unable to provide substantive response to any open ended question or choose from written or spoken FO2 or FO4. 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 Dysarthria characterized by reduced vocal intensity and imprecise articulation which results in intelligibility reduced to ~50% at single word level. Per MBSS, pt with oropharyngeal dysphagia, currently on nectar thickened liquids with purees at home. Dysphagia exercises or strategies are contraindicated at this time d/t difficulty evidenced with following directions and impaired receptive language and memory. Plan to monitor pt's diet tolerance, complete subsequent clinical swallow evaluation, and adjust dysphagia intervention or recommendations based on pt progress. Decreased working memory and attention evidenced throughout evaluation, requiring SLP to redirect pt with verbal cues. Severity of cognitive impairments difficult to assess 2/2 communication impairment. Pt benefiting from usual repetition, use of simple and direct language, visual and verbal cueing for participation in assessment. SLP recommends skilled ST intervention course to facilitate improved communication efficacy, reduce caregiver burden, and improve QoL.    OBJECTIVE IMPAIRMENTS include attention, memory, executive functioning, aphasia, dysarthria, and dysphagia. These impairments  are limiting patient from ADLs/IADLs, effectively communicating at home and in community, and safety when swallowing. Factors affecting potential to achieve goals and functional outcome are ability to learn/carryover information, co-morbidities, previous level of function, and financial resources. Patient will benefit from skilled SLP services to address above impairments and improve overall function.   REHAB POTENTIAL: Fair co-morbidities   PLAN: SLP FREQUENCY: 2x/week   SLP DURATION: 12 weeks   PLANNED INTERVENTIONS: Aspiration precaution training, Pharyngeal  strengthening exercises, Diet toleration management , Language facilitation, Trials of upgraded texture/liquids, Cueing hierachy, Cognitive reorganization, Internal/external aids, Oral motor exercises, Functional tasks, Multimodal communication approach, SLP instruction and feedback, Compensatory strategies, Patient/family education, and Re-evaluation   Su Monks, CCC-SLP 10/02/2022, 10:14 AM

## 2022-10-06 ENCOUNTER — Encounter: Payer: Self-pay | Admitting: Occupational Therapy

## 2022-10-06 ENCOUNTER — Ambulatory Visit: Payer: Medicare HMO | Admitting: Speech Pathology

## 2022-10-06 ENCOUNTER — Ambulatory Visit: Payer: Medicare HMO | Admitting: Occupational Therapy

## 2022-10-06 ENCOUNTER — Ambulatory Visit: Payer: Medicare HMO | Admitting: Physical Therapy

## 2022-10-06 DIAGNOSIS — R4701 Aphasia: Secondary | ICD-10-CM | POA: Diagnosis not present

## 2022-10-06 DIAGNOSIS — I69318 Other symptoms and signs involving cognitive functions following cerebral infarction: Secondary | ICD-10-CM

## 2022-10-06 DIAGNOSIS — R1310 Dysphagia, unspecified: Secondary | ICD-10-CM

## 2022-10-06 DIAGNOSIS — I69351 Hemiplegia and hemiparesis following cerebral infarction affecting right dominant side: Secondary | ICD-10-CM

## 2022-10-06 DIAGNOSIS — R2689 Other abnormalities of gait and mobility: Secondary | ICD-10-CM

## 2022-10-06 DIAGNOSIS — M6281 Muscle weakness (generalized): Secondary | ICD-10-CM

## 2022-10-06 DIAGNOSIS — R41842 Visuospatial deficit: Secondary | ICD-10-CM

## 2022-10-06 DIAGNOSIS — R278 Other lack of coordination: Secondary | ICD-10-CM

## 2022-10-06 DIAGNOSIS — R41841 Cognitive communication deficit: Secondary | ICD-10-CM

## 2022-10-06 DIAGNOSIS — R2681 Unsteadiness on feet: Secondary | ICD-10-CM

## 2022-10-06 DIAGNOSIS — R482 Apraxia: Secondary | ICD-10-CM

## 2022-10-06 DIAGNOSIS — R41840 Attention and concentration deficit: Secondary | ICD-10-CM

## 2022-10-06 NOTE — Therapy (Signed)
OUTPATIENT OCCUPATIONAL THERAPY NEURO TREATMENT  Patient Name: Dustin Blanchard MRN: [id number] DOB:05-Jul-1951, 71 y.o., male Today's Date: 10/06/2022  PCP: [facility] REFERRING PROVIDER: Dr. Leeroy Cha   OT End of Session - 10/06/22 1337     Visit Number 5    Number of Visits 25    Date for OT Re-Evaluation 12/06/22    Authorization Type Aetna Medicare-- [place]- [place]; no auth; 100% coverage    Authorization - Visit Number 5    Authorization - Number of Visits 10    Progress Note Due on Visit 10    OT Start Time 1233    OT Stop Time 1314    OT Time Calculation (min) 41 min    Activity Tolerance Patient tolerated treatment well    Behavior During Therapy [facility] for tasks assessed/performed                Past Medical History:  Diagnosis Date   Dementia [facility])    Past Surgical History:  Procedure Laterality Date   HERNIA REPAIR     HIP SURGERY     IR CT HEAD LTD  08/06/2022   IR CT HEAD LTD  08/06/2022   IR PERCUTANEOUS ART THROMBECTOMY/INFUSION INTRACRANIAL INC DIAG ANGIO  08/06/2022   IR US GUIDE VASC ACCESS RIGHT  08/06/2022   RADIOLOGY WITH ANESTHESIA N/A 08/06/2022   Procedure: IR WITH ANESTHESIA;  Surgeon: Luanne Bras, MD;  Location: [place];  Service: Radiology;  Laterality: N/A;   Patient Active Problem List   Diagnosis Date Noted   Pneumonia of right lower lobe due to infectious organism    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   Sinus pause    Acute combined systolic and diastolic heart failure (HCC)    Atrial fibrillation with RVR (HCC)    Acute on chronic combined systolic and diastolic CHF (congestive heart failure) ([place])    Acute ischemic left MCA stroke ([place]) 08/06/2022    ONSET DATE: 08/06/22  REFERRING DIAG: I63.9 (ICD-10-CM) - CVA (cerebral vascular accident) ([place])   THERAPY DIAG:  Apraxia  Aphasia  Other symptoms and signs involving cognitive functions following cerebral infarction  Attention and concentration  deficit  Other lack of coordination  Hemiplegia and hemiparesis following cerebral infarction affecting right dominant side (HCC)  Visuospatial deficit  SUBJECTIVE:   SUBJECTIVE STATEMENT: The pt's wife states he was trying to do things outside but required caution from his family to promote safety. His use of RW was discontinued by PT.   Pt accompanied by: significant other    PERTINENT HISTORY: Presented to hospital 08/06/2022 with acute onset of right-sided weakness and aphasia. Cranial CT scan showed small age-indeterminate left PCA infarct of the left occipital lobe.  Patient did receive TNK as well as endovascular revascularization of occlusion of left M3 MCA middle division branch 08/06/2022 per interventional radiology. In hospital, cardiology service consulted for new A-fib RVR/CHF.    PMH:  hypertension hyperlipidemia sinus bradycardia/chronic CHF with mild regurgitation BPH history of tobacco use vascular dementia diagnosed 2022  PRECAUTIONS: Fall and Other: aphasia (particularly expressive), Dysphagia #1 nectar thick liquids   PAIN:  Are you having pain? No  FALLS: Has patient fallen in last 6 months? Yes. Number of falls: 1, morning of CVA   OBJECTIVE:   HAND DOMINANCE: Right   TODAY'S TREATMENT:   10/06/2022  Using hand grip in R hand with black spring at easiest setting, pt completed pick up and placement of wooden cubes from table  placed directly in front of and to right of pt's midline with colors as called out by therapist. Pt requiring extensive multimodal cueing to scan to right side of body and increased time for completion of yellow and green block retrieval to address attention, motor planning, and grip strength.   Placement and removal of red, green, and blue resistive clips with use of right hand for strengthening. For challenge to R sided attention, clips of all available colors placed on table to his right. Pt requiring extensive multimodal cueing to  scan to right side of body and increased time for completion.    OT educated pt and spouse on use of yellow theraband bracelet on R wrist to help with R sided neglect. Pt's spouse was encouraged to cue pt as much as possible to the affected side.     PATIENT EDUCATION: Education details: R sided attention Person educated: Patient and Spouse Education method: Customer service manager Education comprehension: verbalized understanding and needs further education    HOME EXERCISE PROGRAM: 09/14/22:  RUE and bilateral hand functional use HEP     GOALS: Potential Goals reviewed with patient? Yes  SHORT TERM GOALS: Target date: 10/06/22; extended due to scheduling to 10/13/2022  Pt/caregiver will be independent with HEP for RUE functional use/coordination.   Goal status: INITIAL  2.  Pt/caregiver will verbalize understanding of visual compensation strategies and visual HEP. Goal status: INITIAL  3.  Pt will perform simple tabletop visual scanning with at least 85% accuracy. Goal status: INITIAL  4.  Pt will improve R grip strength to at least 25lbs for incr functional use. Baseline:  17.1lbs Goal status: INITIAL  5.  Pt will be able to consistently perform bilateral UE tasks with no more than min cueing (such as zipping jacket, using pump of hand sanitizer, folding towels, etc). Goal status: INITIAL    LONG TERM GOALS: Target date: 12/06/22  Pt/caregiver will be independent with HEP for RUE functional use/strength.  Goal status: INITIAL  2.  Pt/caregiver will verbalize understanding of strategies to incr safety/ease/participation with ADLs/IADLs. Goal status: INITIAL  3.  Pt will perform simple environmental scanning with at least 85% accuracy for incr safety. Goal status: INITIAL  4.  Pt will improve RUE coordination/functional use and cognition as shown by improving score on box and blocks to 15 with RUE. Baseline: 5 blocks Goal status: INITIAL  5.  Pt will use  RUE as dominant UE for functional tasks at least 90% of the time. Goal status: INITIAL  6.  Pt will be able to perform BADLs with no more than distant supervision. Goal status: INITIAL  ASSESSMENT:  CLINICAL IMPRESSION: Patient remains limited by apraxia, right inattention/ visual deficits, and language deficits; however, is willing to participate in skilled therapy as needed to promote progression towards goals with overall goal of improving his independence and safety with ADL and IADL completion as able. Pt's rehabilitation likely limited by hx of dementia prior to CVA and would benefit from repetition of tasks to encourage carry over of skills.       PERFORMANCE DEFICITS in functional skills including ADLs, IADLs, coordination, dexterity, proprioception, strength, FMC, GMC, mobility, balance, decreased knowledge of precautions, decreased knowledge of use of DME, vision, and UE functional use, cognitive skills including attention, learn, memory, perception, problem solving, safety awareness, and understand, and psychosocial skills including environmental adaptation, habits, and routines and behaviors.   IMPAIRMENTS are limiting patient from ADLs, IADLs, and leisure.   COMORBIDITIES has co-morbidities such  as vascular dementia  that affects occupational performance. Patient will benefit from skilled OT to address above impairments and improve overall function.  MODIFICATION OR ASSISTANCE TO COMPLETE EVALUATION: Min-Moderate modification of tasks or assist with assess necessary to complete an evaluation.  OT OCCUPATIONAL PROFILE AND HISTORY: Detailed assessment: Review of records and additional review of physical, cognitive, psychosocial history related to current functional performance.  CLINICAL DECISION MAKING: Moderate - several treatment options, min-mod task modification necessary  REHAB POTENTIAL: Good  EVALUATION COMPLEXITY: Moderate    PLAN: OT FREQUENCY: 2x/week  OT  DURATION: 12 weeks +eval  PLANNED INTERVENTIONS: self care/ADL training, therapeutic exercise, therapeutic activity, neuromuscular re-education, passive range of motion, balance training, functional mobility training, fluidotherapy, moist heat, cryotherapy, patient/family education, cognitive remediation/compensation, visual/perceptual remediation/compensation, energy conservation, coping strategies training, and DME and/or AE instructions  RECOMMENDED OTHER SERVICES: current with Speech therapy, needs to schedule PT (referral in Epic)  CONSULTED AND AGREED WITH PLAN OF CARE: Patient and family member/caregiver  PLAN FOR NEXT SESSION:  review HEP prn, RUE functional use, simple visual scanning (recommend limiting choices to 4-5 options)   Dennis Bast, OTR/L 10/06/2022, 2:27 PM

## 2022-10-06 NOTE — Therapy (Signed)
OUTPATIENT PHYSICAL THERAPY NEURO TREATMENT   Patient Name: Dustin Blanchard MRN: [id number] DOB:[birth year], 71 y.o., male Today's Date: 10/06/2022   PCP: Dr. Loletha Grayer at [facility] PROVIDER: Barbie Banner, PA-C    PT End of Session - 10/06/22 1402     Visit Number 7    Number of Visits 17   with eval   Date for PT Re-Evaluation 11/23/22   to allow for scheduling delays   Authorization Type [employer]    Progress Note Due on Visit 10    PT Start Time 1402   from speech therapy session   PT Stop Time 1445    PT Time Calculation (min) 43 min    Equipment Utilized During Treatment Gait belt    Activity Tolerance Patient tolerated treatment well    Behavior During Therapy [facility] for tasks assessed/performed;Flat affect;Impulsive                  Past Medical History:  Diagnosis Date   Dementia [facility])    Past Surgical History:  Procedure Laterality Date   HERNIA REPAIR     HIP SURGERY     IR CT HEAD LTD  08/06/2022   IR CT HEAD LTD  08/06/2022   IR PERCUTANEOUS ART THROMBECTOMY/INFUSION INTRACRANIAL INC DIAG ANGIO  08/06/2022   IR US GUIDE VASC ACCESS RIGHT  08/06/2022   RADIOLOGY WITH ANESTHESIA N/A 08/06/2022   Procedure: IR WITH ANESTHESIA;  Surgeon: Luanne Bras, MD;  Location: [place];  Service: Radiology;  Laterality: N/A;   Patient Active Problem List   Diagnosis Date Noted   Pneumonia of right lower lobe due to infectious organism    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   Sinus pause    Acute combined systolic and diastolic heart failure (HCC)    Atrial fibrillation with RVR (HCC)    Acute on chronic combined systolic and diastolic CHF (congestive heart failure) ([place])    Acute ischemic left MCA stroke ([place]) 08/06/2022    ONSET DATE: 08/26/2022   REFERRING DIAG: I63.9 (ICD-10-CM) - CVA (cerebral vascular accident) ([place])   THERAPY DIAG:  Muscle weakness (generalized)  Other abnormalities of gait and  mobility  Other lack of coordination  Unsteadiness on feet  Rationale for Evaluation and Treatment Rehabilitation  SUBJECTIVE:                                                                                                                                                                                              SUBJECTIVE STATEMENT: [name] reports he was trying to help split wood yesterday and got  frustrated when his family told him to carry smaller pieces rather than big logs. She also reports that the patient has been doing ok balance-wise without using the RW.  Pt accompanied by: significant other wife Dustin Blanchard  PERTINENT HISTORY: hypertension, hyperlipidemia, sinus bradycardia/chronic CHF with mild regurgitation, BPH, history of tobacco use, vascular dementia diagnosed 2022   PAIN:  Are you having pain? No (pt able to verbalize no)   PRECAUTIONS: Fall and Other: aphasia, dysphagia 1 (nectar-thick)  WEIGHT BEARING RESTRICTIONS No  FALLS: Has patient fallen in last 6 months? Yes. Number of falls one fall when he had the stroke, no other falls  PLOF: Needs assistance with gait, Needs assistance with transfers, and uses RW in the home with Supervision, uses transport chair for community mobility  PATIENT GOALS "to get him back to moving and walking better"  OBJECTIVE:   TODAY'S TREATMENT:  GAIT: Gait pattern:  shoulders elevated and trunk flexed Distance walked: 115 ft Assistive device utilized: None Level of assistance: CGA Comments: while carrying 15# surge to simulate carrying firewood  Gait pattern:  shoulders elevated and trunk flexed Distance walked: 115 ft Assistive device utilized: None Level of assistance: CGA Comments: carrying 15# surge to simulate firewood; across uneven mat on ground and navigating through obstacles to simulate more functional environment  THER ACT: Static standing balance performing basketball toss through hula hoop with CGA for balance,  focus on scanning to the right and scoring with moving target  Standing balance performing balloon volleyball with CGA with focus on scanning to the R with gradual increase in speed of task as well as increase in difficulty due to pt having to reach further outside BOS to hit target to return it  Resisted gait x 345 ft with CGA with multidirectional perturbations for LOB, cross-stepping strategy noted with LOB to the R   PATIENT EDUCATION: Education details: allow pt to participate in functional tasks as much as he is safe and able (ie can help carry firewood in small amounts across more level ground) Person educated: Patient and Spouse Education method: Customer service manager Education comprehension: verbalized understanding, returned demonstration, verbal cues required, and needs further education   HOME EXERCISE PROGRAM: Access Code: [email or web address] URL: [email or web address] Date: 09/17/2022 Prepared by: Excell Seltzer  Exercises - Side Stepping with Counter Support  - 1 x daily - 7 x weekly - 1 sets - 5 reps - Tandem Walking with Counter Support  - 1 x daily - 7 x weekly - 1 sets - 5 reps - Alternating Step Taps with Counter Support  - 1 x daily - 7 x weekly - 2 sets - 10 reps   GOALS: Goals reviewed with patient? Yes  SHORT TERM GOALS: Target date: 10/12/2022  Pt will be independent with initial HEP for improved strength, balance, transfers and gait.  Baseline: Goal status: INITIAL  2.  Pt will improve gait velocity to at least 2.0 ft/sec for improved gait efficiency and performance at Supervision level   Baseline: 1.82 ft/sec (9/25) Goal status: INITIAL  3.  Pt will improve normal TUG to less than or equal to 30 seconds for improved functional mobility and decreased fall risk.  Baseline: 40.43 sec (9/25) Goal status: INITIAL  4.  Pt will improve Berg score to 30/56 for decreased fall risk  Baseline: 26/56 (9/25) Goal status: INITIAL  5.  Pt will  improve 5 x STS to less than or equal to 30 seconds to demonstrate improved functional strength and transfer efficiency.  Baseline: 34.62 sec (9/25) Goal status: INITIAL   LONG TERM GOALS: Target date: 11/09/2022  Pt will be independent with final HEP for improved strength, balance, transfers and gait.  Baseline:  Goal status: INITIAL  2.  Pt will improve gait velocity to at least 2.25 ft/sec for improved gait efficiency and performance at Supervision level   Baseline: 1.82 ft/sec (9/25) Goal status: INITIAL  3.  Pt will improve normal TUG to less than or equal to 20 seconds for improved functional mobility and decreased fall risk.  Baseline: 40.43 sec (9/25) Goal status: INITIAL  4.  Pt will improve Berg score to 34/56 for decreased fall risk  Baseline: 26/56 (925) Goal status: INITIAL  5.  Pt will improve 5 x STS to less than or equal to 25 seconds to demonstrate improved functional strength and transfer efficiency.   Baseline: 34.62 sec (9/25) Goal status: INITIAL   ASSESSMENT:  CLINICAL IMPRESSION: Emphasis of skilled PT session on R attention, visual scanning, and simulation of functional tasks that pt performs in home environment. Pt continues to exhibit significant R inattention/neglect that decreases his safety with functional mobility. Pt continues to exhibit best performance on tasks that are familiar or are more meaningful to him. Pt continues to benefit from skilled therapy services to address ongoing R neglect and decreased safety with navigating functional environments. Continue POC.    OBJECTIVE IMPAIRMENTS Abnormal gait, decreased balance, decreased cognition, decreased knowledge of condition, decreased knowledge of use of DME, decreased mobility, decreased strength, decreased safety awareness, and impaired vision/preception.   ACTIVITY LIMITATIONS carrying, lifting, bending, standing, squatting, stairs, and transfers  PARTICIPATION LIMITATIONS: meal prep,  cleaning, laundry, medication management, interpersonal relationship, driving, shopping, community activity, and yard work  PERSONAL FACTORS 1-2 comorbidities:    PMH that includes:  hypertension, hyperlipidemia, sinus bradycardia/chronic CHF with mild regurgitation, BPH, history of tobacco use, vascular dementia diagnosed 2022 are also affecting patient's functional outcome.   REHAB POTENTIAL: Fair history of dementia and severity of R neglect/inattention  CLINICAL DECISION MAKING: Stable/uncomplicated  EVALUATION COMPLEXITY: Moderate  PLAN: PT FREQUENCY: 2x/week  PT DURATION: 8 weeks  PLANNED INTERVENTIONS: Therapeutic exercises, Therapeutic activity, Neuromuscular re-education, Balance training, Gait training, Patient/Family education, Self Care, Joint mobilization, Stair training, Vestibular training, Canalith repositioning, Visual/preceptual remediation/compensation, Orthotic/Fit training, DME instructions, Aquatic Therapy, Dry Needling, Cognitive remediation, Electrical stimulation, Wheelchair mobility training, Cryotherapy, Moist heat, Taping, Manual therapy, and Re-evaluation  PLAN FOR NEXT SESSION: Seated and standing R inattention tasks. add to HEP for balance and R attention, work on [employer], visual scanning to the R and attention to R side, safety with transfers and mobility with LRAD, enjoys basketball so incorporating that or other sports into treatment session, sweeping? Golf? Carrying firewood through obstacle course (per wife he would carry it down an incline)   Excell Seltzer, PT, DPT, CSRS 10/06/2022, 2:46 PM

## 2022-10-06 NOTE — Therapy (Signed)
OUTPATIENT SPEECH LANGUAGE PATHOLOGY TREATMENT NOTE   Patient Name: Dustin Blanchard MRN: [id number] DOB:[birth year], 71 y.o., male Today's Date: 10/06/2022  [facility] [place] PROVIDER: Barbie Banner, PA-C   END OF SESSION:   End of Session - 10/06/22 1257     Visit Number 8    Number of Visits 25    Date for SLP Re-Evaluation 11/30/22    Authorization Type [employer]na Medicare    Progress Note Due on Visit 10    SLP Start Time 1315    SLP Stop Time  1400    SLP Time Calculation (min) 45 min    Activity Tolerance Patient tolerated treatment well;Other (comment)   poor frsutration tolerance                Past Medical History:  Diagnosis Date   Dementia [facility])    Past Surgical History:  Procedure Laterality Date   HERNIA REPAIR     HIP SURGERY     IR CT HEAD LTD  08/06/2022   IR CT HEAD LTD  08/06/2022   IR PERCUTANEOUS ART THROMBECTOMY/INFUSION INTRACRANIAL INC DIAG ANGIO  08/06/2022   IR US GUIDE VASC ACCESS RIGHT  08/06/2022   RADIOLOGY WITH ANESTHESIA N/A 08/06/2022   Procedure: IR WITH ANESTHESIA;  Surgeon: Luanne Bras, MD;  Location: [place];  Service: Radiology;  Laterality: N/A;   Patient Active Problem List   Diagnosis Date Noted   Pneumonia of right lower lobe due to infectious organism    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   Sinus pause    Acute combined systolic and diastolic heart failure (HCC)    Atrial fibrillation with RVR (HCC)    Acute on chronic combined systolic and diastolic CHF (congestive heart failure) ([place])    Acute ischemic left MCA stroke ([place]) 08/06/2022    ONSET DATE: 08/06/2022    REFERRING DIAG: I63.9 (ICD-10-CM) - CVA (cerebral vascular accident) ([place])   THERAPY DIAG:  Aphasia  Cognitive communication deficit  Apraxia  Rationale for Evaluation and Treatment Rehabilitation  SUBJECTIVE: "I tried to puree pork chops but it didn't work so I made him something else" spouse  PAIN:   Are you having pain? No  OBJECTIVE:   TODAY'S TREATMENT:  10-06-22: Target naming of function home items. Given picture and FO2 written options, pt names item correctly in 3/3 trials. Continues to benefit from options being presented vertically to compensate for R visual cut. Pt achieved verbal approximation of named items in 3/3 trials with usual max-A. Following, able to verbally name 1/3 item with mod-I, additional x1 with delayed model. Perseveration impacting subsequent attempts to name with pt shaking head indicating awareness of perseveration. Reintroduced TD SNAP program with pt able to accurately select dictated items in 1/4 trials from [facility] with max verbal A for scanning. R neglect appears to be primary barrier at this time to augmentative communication device. PO trials of mixed consistencies without overt s/sx of aspiration and demonstrating good oral clearance. Trial thin liquids via cup and straw without overt s/sx of aspiration. Clear vocal quality remains throughout bolus administration in sustained ah production. Probed ability to complete exercises, if indicated with pt unable to follow cues to swallow volitionally or complete chin tuck against resistance. As such, dyspahgia exercises appear to be contraindicated at this time. However, based on improvement during clinical administrations of thin liquids, and time since prior instrumental evaluation, recommend repeat instrumental swallow study to objectively assess swallow function. Ordered  this date with education provided to pt and spouse re: purpose and potential outcomes. If possible, request that esophageal screening be completed by [facility] administering clinician.    10-02-22: Increased frustration this date from previous sessio. Target response to personal ID questions. Pt able to verbalize name with and without verbal cues. Approximation of birthday with written cues and direct model. Verbalizes wife's name with visual A and direct  model. Given picture representation of everyday objects, pt able to select correct actual object for FO4 5/6 trials. Given FO3 picture representations, pt able to ID correct object presented in 9/11 trials. Inattention appears to be barrier vs object discrimination abilities. Use of TD SNAP program to assess for AAC candidacy. Pt able to select appropriate verbally dictated icon from [place] with 90% accuracy. Incorrect selection d/t decreased technology proficiency at time but pt with verbal "no" and continued scanning for correct icon.   10-01-22: Trial thin liquid bolus trials today, from cup with pt self-feeding. Intermittent throat cleats but overall appears to have handled bolus trials well. Was unable to complete sequential sips. Continues to have barriers to exercises or implementation of swallow compensations 2/2 receptive language impairment and cognitive impairment. SLP recommends consideration of instrumental swallow study to objectively assess pt's swallow function.  Target naming and auditory comprehension of familiar objects. Pt able to repeat name of all objects with max-A. ID correct object from FO2 6/6 trials; ID correct object from [facility] 6/6 trials. ID correct object from [place] 3/6 trials. Following, pt able to name 12/18 objects given phonemic cues.     PATIENT EDUCATION: Education details: see above Person educated: Patient and Spouse Education method: Explanation, Demonstration, and Handouts Education comprehension: verbalized understanding, returned demonstration, and needs further education   GOALS: Goals reviewed with patient? Yes   SHORT TERM GOALS: Target date: 10/12/2022   Pt will average 80% accuracy in practical naming tasks with usual mod-A over 2 sessions.  Baseline: Goal status: INITIAL   2.  Spouse will teach back supportive communication strategies to aid in auditory comprehension at home with supervision A.   Baseline:  Goal status: INITIAL   3.  Pt will  comprehend  written choice f:3 at word level with known context 80% to make accurate selections of preferences, answer questions at home with occasional min A Baseline:  Goal status: INITIAL   4.  Pt will be intelligible in 2 word phrase production in 15/20 sentences with direct model and usual mod-A  Baseline:  Goal status: INITIAL   5.  Pt will complete augmentative communication/SGD canidacy assessment to determine appropriate communication device to support communication of wants/needs/thoughts Baseline:  Goal status: INITIAL     LONG TERM GOALS: Target date: 11/30/2022    Pt will use AAC/multimodal communicate to communicate choices, wants at home with family and caregiver over 1 week Baseline:  Goal status: INITIAL   2.  Pt will name 5 items in personally relevant categories with occasional mod A over 2 sessions Baseline:  Goal status: INITIAL   3.  Pt will employ dysarthria strategies resulting in 50% intelligibility in self-generated words and phrases with usual mod-A Baseline:  Goal status: INITIAL   4.  Pt will participate in clinical swallow evaluation to assess for candidacy for upgraded diet textures and liquid consistencies PRN Baseline:  Goal status: INITIAL     ASSESSMENT:   CLINICAL IMPRESSION: Patient is a 71 y.o. M who was seen today for cognitive linguistic and dysphagia evaluation following CVA. Pt presenting  with severe non-fluent aphasia, moderate dysarthria, oropharyngeal dysphagia, and cognitive deficits in areas of attention, memory, executive functioning.  Significant expressive > receptive language deficits impacting overall communication abilities. Pt spouse reporting usual frustration at home, though are employing yes/no questioning to determine wants/needs. Pt evidences frustration with communication limitations this date: crying but insists on persisting through evaluation. Notable difficulties in communicating beyond answer y/n questions, unable to  provide substantive response to any open ended question or choose from written or spoken FO2 or FO4. 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 Dysarthria characterized by reduced vocal intensity and imprecise articulation which results in intelligibility reduced to ~50% at single word level. Per MBSS, pt with oropharyngeal dysphagia, currently on nectar thickened liquids with purees at home. Dysphagia exercises or strategies are contraindicated at this time d/t difficulty evidenced with following directions and impaired receptive language and memory. Plan to monitor pt's diet tolerance, complete subsequent clinical swallow evaluation, and adjust dysphagia intervention or recommendations based on pt progress. Decreased working memory and attention evidenced throughout evaluation, requiring SLP to redirect pt with verbal cues. Severity of cognitive impairments difficult to assess 2/2 communication impairment. Pt benefiting from usual repetition, use of simple and direct language, visual and verbal cueing for participation in assessment. SLP recommends skilled ST intervention course to facilitate improved communication efficacy, reduce caregiver burden, and improve QoL.    OBJECTIVE IMPAIRMENTS include attention, memory, executive functioning, aphasia, dysarthria, and dysphagia. These impairments are limiting patient from ADLs/IADLs, effectively communicating at home and in community, and safety when swallowing. Factors affecting potential to achieve goals and functional outcome are ability to learn/carryover information, co-morbidities, previous level of function, and financial resources. Patient will benefit from skilled SLP services to address above impairments and improve overall function.   REHAB POTENTIAL: Fair co-morbidities   PLAN: SLP FREQUENCY: 2x/week   SLP DURATION: 12 weeks   PLANNED INTERVENTIONS: Aspiration precaution training, Pharyngeal strengthening exercises, Diet  toleration management , Language facilitation, Trials of upgraded texture/liquids, Cueing hierachy, Cognitive reorganization, Internal/external aids, Oral motor exercises, Functional tasks, Multimodal communication approach, SLP instruction and feedback, Compensatory strategies, Patient/family education, and Re-evaluation   Su Monks, CCC-SLP 10/06/2022, 12:58 PM

## 2022-10-07 ENCOUNTER — Other Ambulatory Visit (HOSPITAL_COMMUNITY): Payer: Self-pay

## 2022-10-07 DIAGNOSIS — R1310 Dysphagia, unspecified: Secondary | ICD-10-CM

## 2022-10-08 ENCOUNTER — Ambulatory Visit: Payer: Medicare HMO | Admitting: Occupational Therapy

## 2022-10-08 ENCOUNTER — Ambulatory Visit: Payer: Medicare HMO | Admitting: Speech Pathology

## 2022-10-08 ENCOUNTER — Ambulatory Visit: Payer: Medicare HMO

## 2022-10-08 ENCOUNTER — Encounter: Payer: Self-pay | Admitting: Occupational Therapy

## 2022-10-08 DIAGNOSIS — R4701 Aphasia: Secondary | ICD-10-CM

## 2022-10-08 DIAGNOSIS — M6281 Muscle weakness (generalized): Secondary | ICD-10-CM

## 2022-10-08 DIAGNOSIS — R2689 Other abnormalities of gait and mobility: Secondary | ICD-10-CM

## 2022-10-08 DIAGNOSIS — R278 Other lack of coordination: Secondary | ICD-10-CM

## 2022-10-08 DIAGNOSIS — R482 Apraxia: Secondary | ICD-10-CM

## 2022-10-08 DIAGNOSIS — R2681 Unsteadiness on feet: Secondary | ICD-10-CM

## 2022-10-08 DIAGNOSIS — I69351 Hemiplegia and hemiparesis following cerebral infarction affecting right dominant side: Secondary | ICD-10-CM

## 2022-10-08 DIAGNOSIS — R1310 Dysphagia, unspecified: Secondary | ICD-10-CM

## 2022-10-08 DIAGNOSIS — R41840 Attention and concentration deficit: Secondary | ICD-10-CM

## 2022-10-08 DIAGNOSIS — R41841 Cognitive communication deficit: Secondary | ICD-10-CM

## 2022-10-08 NOTE — Therapy (Signed)
OUTPATIENT OCCUPATIONAL THERAPY NEURO TREATMENT  Patient Name: Dustin Blanchard MRN: [id number] DOB:05-21-1951, 71 y.o., male Today's Date: 10/08/2022  PCP: [facility] REFERRING PROVIDER: Dr. Leeroy Cha   Past Medical History:  Diagnosis Date   Dementia [facility])    Past Surgical History:  Procedure Laterality Date   HERNIA REPAIR     HIP SURGERY     IR CT HEAD LTD  08/06/2022   IR CT HEAD LTD  08/06/2022   IR PERCUTANEOUS ART THROMBECTOMY/INFUSION INTRACRANIAL INC DIAG ANGIO  08/06/2022   IR US GUIDE VASC ACCESS RIGHT  08/06/2022   RADIOLOGY WITH ANESTHESIA N/A 08/06/2022   Procedure: IR WITH ANESTHESIA;  Surgeon: Luanne Bras, MD;  Location: [facility];  Service: Radiology;  Laterality: N/A;   Patient Active Problem List   Diagnosis Date Noted   Pneumonia of right lower lobe due to infectious organism    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   Sinus pause    Acute combined systolic and diastolic heart failure (HCC)    Atrial fibrillation with RVR (HCC)    Acute on chronic combined systolic and diastolic CHF (congestive heart failure) ([place])    Acute ischemic left MCA stroke ([place]) 08/06/2022    ONSET DATE: 08/06/22  REFERRING DIAG: I63.9 (ICD-10-CM) - CVA (cerebral vascular accident) ([place])   THERAPY DIAG:  Apraxia  Aphasia  Muscle weakness (generalized)  Other lack of coordination  Attention and concentration deficit  Hemiplegia and hemiparesis following cerebral infarction affecting right dominant side (HCC)  SUBJECTIVE:   SUBJECTIVE STATEMENT: 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  Pt accompanied by: significant other    PERTINENT HISTORY: Presented to hospital 08/06/2022 with acute onset of right-sided weakness and aphasia. Cranial CT scan showed small age-indeterminate left PCA infarct of the left occipital lobe.  Patient did receive TNK as well as endovascular revascularization  of occlusion of left M3 MCA middle division branch 08/06/2022 per interventional radiology. In hospital, cardiology service consulted for new A-fib RVR/CHF.    PMH:  hypertension hyperlipidemia sinus bradycardia/chronic CHF with mild regurgitation BPH history of tobacco use vascular dementia diagnosed 2022  PRECAUTIONS: Fall and Other: aphasia (particularly expressive), Dysphagia #1 nectar thick liquids   PAIN:  Are you having pain? No  FALLS: Has patient fallen in last 6 months? Yes. Number of falls: 1, morning of CVA   OBJECTIVE:   Less cueing this visit compared to previous visit when given less available choices. Pt demonstrating relative ease with bolt and nut removal but was more challenged when placing bolt and nuts back into board. Pt requiring more cues at end of tasks. Pt wearing yellow bracelet on R hand.   HAND DOMINANCE: Right   TODAY'S TREATMENT:   10/08/2022  Using hand grip in R hand with black spring at easiest setting, pt completed pick up and placement of wooden cubes from table placed directly in front of and to right of pt's midline with colors as called out by therapist. OT reduced the number of available choices with pt requiring moderate multimodal cueing to scan to right side of body and increased time for completion of yellow, green, white, and blue block retrieval to address attention, motor planning, and grip strength.   Placement and removal of red, green, and blue resistive clips with use of right hand for strengthening. For challenge to R sided attention, clips of all available colors placed on table to his right limiting to  4-5 choices at a time. Pt requiring moderate multimodal cueing to scan to right side of body and increased time for completion.    Small and large bolt and nut disassembly and assembly x8 total with use of L hand for manipulation of parts and R hand for stabilization to promote fine motor coordination, bimanual coordination, and strength  following CVA. Pt demonstrating more difficulty with partially occluded vision requiring occasional repositioning.    HOME EXERCISE PROGRAM: 09/14/22:  RUE and bilateral hand functional use HEP    GOALS: Potential Goals reviewed with patient? Yes  SHORT TERM GOALS: Target date: 10/06/22; extended due to scheduling to 10/13/2022  Pt/caregiver will be independent with HEP for RUE functional use/coordination.   Goal status: INITIAL  2.  Pt/caregiver will verbalize understanding of visual compensation strategies and visual HEP. Goal status: INITIAL  3.  Pt will perform simple tabletop visual scanning with at least 85% accuracy. Goal status: INITIAL  4.  Pt will improve R grip strength to at least 25lbs for incr functional use. Baseline:  17.1lbs Goal status: INITIAL  5.  Pt will be able to consistently perform bilateral UE tasks with no more than min cueing (such as zipping jacket, using pump of hand sanitizer, folding towels, etc). Goal status: INITIAL    LONG TERM GOALS: Target date: 12/06/22  Pt/caregiver will be independent with HEP for RUE functional use/strength.  Goal status: INITIAL  2.  Pt/caregiver will verbalize understanding of strategies to incr safety/ease/participation with ADLs/IADLs. Goal status: INITIAL  3.  Pt will perform simple environmental scanning with at least 85% accuracy for incr safety. Goal status: INITIAL  4.  Pt will improve RUE coordination/functional use and cognition as shown by improving score on box and blocks to 15 with RUE. Baseline: 5 blocks Goal status: INITIAL  5.  Pt will use RUE as dominant UE for functional tasks at least 90% of the time. Goal status: INITIAL  6.  Pt will be able to perform BADLs with no more than distant supervision. Goal status: INITIAL  ASSESSMENT:  CLINICAL IMPRESSION: Patient remains limited by apraxia, right inattention/ visual deficits, and language deficits; however, is willing to participate in  skilled therapy as needed to promote progression towards goals with overall goal of improving his independence and safety with ADL and IADL completion as able. Pt demonstrates improvement with task completion this date and will likely continue to benefit from task repetition. Pt also seems to perform better with familiar, mechanical tasks. Would recommend incorporation of similar tasks into future treatments.   PERFORMANCE DEFICITS in functional skills including ADLs, IADLs, coordination, dexterity, proprioception, strength, FMC, GMC, mobility, balance, decreased knowledge of precautions, decreased knowledge of use of DME, vision, and UE functional use, cognitive skills including attention, learn, memory, perception, problem solving, safety awareness, and understand, and psychosocial skills including environmental adaptation, habits, and routines and behaviors.   IMPAIRMENTS are limiting patient from ADLs, IADLs, and leisure.   COMORBIDITIES has co-morbidities such as vascular dementia  that affects occupational performance. Patient will benefit from skilled OT to address above impairments and improve overall function.  REHAB POTENTIAL: Good   PLAN: OT FREQUENCY: 2x/week  OT DURATION: 12 weeks +eval  PLANNED INTERVENTIONS: self care/ADL training, therapeutic exercise, therapeutic activity, neuromuscular re-education, passive range of motion, balance training, functional mobility training, fluidotherapy, moist heat, cryotherapy, patient/family education, cognitive remediation/compensation, visual/perceptual remediation/compensation, energy conservation, coping strategies training, and DME and/or AE instructions  RECOMMENDED OTHER SERVICES: current with Speech therapy, needs to schedule  PT (referral in Epic)  CONSULTED AND AGREED WITH PLAN OF CARE: Patient and family member/caregiver  PLAN FOR NEXT SESSION:  review HEP prn, RUE functional use, simple visual scanning (recommend limiting choices to  4-5 options), continue with nuts and bolts   Dennis Bast, OTR/L 10/08/2022, 2:37 PM

## 2022-10-08 NOTE — Therapy (Signed)
OUTPATIENT PHYSICAL THERAPY NEURO TREATMENT   Patient Name: Dustin Blanchard MRN: [id number] DOB:03/04/1951, 71 y.o., male Today's Date: 10/08/2022   PCP: Dr. Loletha Grayer at [facility] PROVIDER: Barbie Banner, PA-C    PT End of Session - 10/08/22 1225     Visit Number 8    Number of Visits 17    Date for PT Re-Evaluation 11/23/22    Authorization Type [employer]    Progress Note Due on Visit 10    PT Start Time 1230    PT Stop Time 1312    PT Time Calculation (min) 42 min    Equipment Utilized During Treatment Gait belt    Activity Tolerance Patient tolerated treatment well    Behavior During Therapy [facility] for tasks assessed/performed;Flat affect;Impulsive                  Past Medical History:  Diagnosis Date   Dementia [facility])    Past Surgical History:  Procedure Laterality Date   HERNIA REPAIR     HIP SURGERY     IR CT HEAD LTD  08/06/2022   IR CT HEAD LTD  08/06/2022   IR PERCUTANEOUS ART THROMBECTOMY/INFUSION INTRACRANIAL INC DIAG ANGIO  08/06/2022   IR US GUIDE VASC ACCESS RIGHT  08/06/2022   RADIOLOGY WITH ANESTHESIA N/A 08/06/2022   Procedure: IR WITH ANESTHESIA;  Surgeon: Luanne Bras, MD;  Location: [facility];  Service: Radiology;  Laterality: N/A;   Patient Active Problem List   Diagnosis Date Noted   Pneumonia of right lower lobe due to infectious organism    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)   THERAPY DIAG:  Muscle weakness (generalized)  Other abnormalities of gait and mobility  Other lack of coordination  Unsteadiness on feet  Rationale for  Evaluation and Treatment Rehabilitation  SUBJECTIVE:                                                                                                                                                                                              SUBJECTIVE STATEMENT: Majority of information provided by wife- denies falls/near falls. Answering simple yes/no questions.   Pt accompanied by: significant other wife [name]  PERTINENT HISTORY: hypertension, hyperlipidemia,  sinus bradycardia/chronic CHF with mild regurgitation, BPH, history of tobacco use, vascular dementia diagnosed 2022   PAIN:  Are you having pain? No (pt able to verbalize no)   PRECAUTIONS: Fall and Other: aphasia, dysphagia 1 (nectar-thick)  WEIGHT BEARING RESTRICTIONS No  FALLS: Has patient fallen in last 6 months? Yes. Number of falls one fall when he had the stroke, no other falls  PLOF: Needs assistance with gait, Needs assistance with transfers, and uses RW in the home with Supervision, uses transport chair for community mobility  PATIENT GOALS "to get him back to moving and walking better"  OBJECTIVE:   TODAY'S TREATMENT:  GAIT: -44' with hiking poles for increased R UE swing   THER ACT: -standing solid ground batting balloon; able to attend to R side and scan to find balloon on R -standing on foam batting balloon  -R trunk rotation to toss 2kg ball at [employer] x30 reps  -standing endurance completing puzzle with improving ability to scan to the R to retrieve puzzle pieces   PATIENT EDUCATION: Education details: continue HEP Person educated: Patient and Spouse Education method: Customer service manager Education comprehension: verbalized understanding, returned demonstration, verbal cues required, and needs further education   HOME EXERCISE PROGRAM: Access Code: [email or web address] URL: [email or web address] Date: 09/17/2022 Prepared by: Excell Seltzer  Exercises - Side Stepping with Counter  Support  - 1 x daily - 7 x weekly - 1 sets - 5 reps - Tandem Walking with Counter Support  - 1 x daily - 7 x weekly - 1 sets - 5 reps - Alternating Step Taps with Counter Support  - 1 x daily - 7 x weekly - 2 sets - 10 reps   GOALS: Goals reviewed with patient? Yes  SHORT TERM GOALS: Target date: 10/12/2022  Pt will be independent with initial HEP for improved strength, balance, transfers and gait.  Baseline: Goal status: INITIAL  2.  Pt will improve gait velocity to at least 2.0 ft/sec for improved gait efficiency and performance at Supervision level   Baseline: 1.82 ft/sec (9/25) Goal status: INITIAL  3.  Pt will improve normal TUG to less than or equal to 30 seconds for improved functional mobility and decreased fall risk.  Baseline: 40.43 sec (9/25) Goal status: INITIAL  4.  Pt will improve Berg score to 30/56 for decreased fall risk  Baseline: 26/56 (9/25) Goal status: INITIAL  5.  Pt will improve 5 x STS to less than or equal to 30 seconds to demonstrate improved functional strength and transfer efficiency.   Baseline: 34.62 sec (9/25) Goal status: INITIAL   LONG TERM GOALS: Target date: 11/09/2022  Pt will be independent with final HEP for improved strength, balance, transfers and gait.  Baseline:  Goal status: INITIAL  2.  Pt will improve gait velocity to at least 2.25 ft/sec for improved gait efficiency and performance at Supervision level   Baseline: 1.82 ft/sec (9/25) Goal status: INITIAL  3.  Pt will improve normal TUG to less than or equal to 20 seconds for improved functional mobility and decreased fall risk.  Baseline: 40.43 sec (9/25) Goal status: INITIAL  4.  Pt will improve Berg score to 34/56 for decreased fall risk  Baseline: 26/56 (925) Goal status: INITIAL  5.  Pt will improve 5 x STS to less than or equal to 25 seconds to demonstrate improved functional strength and transfer efficiency.   Baseline: 34.62 sec (9/25) Goal status:  INITIAL   ASSESSMENT:  CLINICAL IMPRESSION: Patient seen for  skilled PT session with emphasis on R functional visual scanning. Able to engage R UE in reciprocal arm swing task using hiking poles. Improving ability to scan and follow items to the R including turning as completing as possible to the R to toss ball at rebounder. Max cues needed to complete puzzle accurately, but required only min additional cues to scan R to retrieve puzzle pieces. Continue POC.    OBJECTIVE IMPAIRMENTS Abnormal gait, decreased balance, decreased cognition, decreased knowledge of condition, decreased knowledge of use of DME, decreased mobility, decreased strength, decreased safety awareness, and impaired vision/preception.   ACTIVITY LIMITATIONS carrying, lifting, bending, standing, squatting, stairs, and transfers  PARTICIPATION LIMITATIONS: meal prep, cleaning, laundry, medication management, interpersonal relationship, driving, shopping, community activity, and yard work  PERSONAL FACTORS 1-2 comorbidities:    PMH that includes:  hypertension, hyperlipidemia, sinus bradycardia/chronic CHF with mild regurgitation, BPH, history of tobacco use, vascular dementia diagnosed 2022 are also affecting patient's functional outcome.   REHAB POTENTIAL: Fair history of dementia and severity of R neglect/inattention  CLINICAL DECISION MAKING: Stable/uncomplicated  EVALUATION COMPLEXITY: Moderate  PLAN: PT FREQUENCY: 2x/week  PT DURATION: 8 weeks  PLANNED INTERVENTIONS: Therapeutic exercises, Therapeutic activity, Neuromuscular re-education, Balance training, Gait training, Patient/Family education, Self Care, Joint mobilization, Stair training, Vestibular training, Canalith repositioning, Visual/preceptual remediation/compensation, Orthotic/Fit training, DME instructions, Aquatic Therapy, Dry Needling, Cognitive remediation, Electrical stimulation, Wheelchair mobility training, Cryotherapy, Moist heat, Taping, Manual  therapy, and Re-evaluation  PLAN FOR NEXT SESSION: Seated and standing R inattention tasks. add to HEP for balance and R attention, work on [employer], visual scanning to the R and attention to R side, safety with transfers and mobility with LRAD, enjoys basketball so incorporating that or other sports into treatment session, sweeping? Golf? Carrying firewood through obstacle course (per wife he would carry it down an incline); ASSESS GOALS   Debbora Dus, PT, DPT Debbora Dus, PT, DPT, CBIS  10/08/2022, 1:14 PM

## 2022-10-08 NOTE — Therapy (Signed)
OUTPATIENT SPEECH LANGUAGE PATHOLOGY TREATMENT NOTE   Patient Name: Dustin Blanchard MRN: [id number] DOB:Mar 14, 1951, 71 y.o., male Today's Date: 10/08/2022  PCP: [facility] [place] PROVIDER: Vaughan Basta, [place] Circle, PA-C   END OF SESSION:   End of Session - 10/08/22 1326     Visit Number 9    Number of Visits 25    Date for SLP Re-Evaluation 11/30/22    Authorization Type Aetna Medicare    Progress Note Due on Visit 10    SLP Start Time 1400    SLP Stop Time  1445    SLP Time Calculation (min) 45 min    Activity Tolerance Patient tolerated treatment well   poor frsutration tolerance                 Past Medical History:  Diagnosis Date   Dementia [facility])    Past Surgical History:  Procedure Laterality Date   HERNIA REPAIR     HIP SURGERY     IR CT HEAD LTD  08/06/2022   IR CT HEAD LTD  08/06/2022   IR PERCUTANEOUS ART THROMBECTOMY/INFUSION INTRACRANIAL INC DIAG ANGIO  08/06/2022   IR US GUIDE VASC ACCESS RIGHT  08/06/2022   RADIOLOGY WITH ANESTHESIA N/A 08/06/2022   Procedure: IR WITH ANESTHESIA;  Surgeon: Luanne Bras, MD;  Location: [place];  Service: Radiology;  Laterality: N/A;   Patient Active Problem List   Diagnosis Date Noted   Pneumonia of right lower lobe due to infectious organism    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 No  OBJECTIVE:   TODAY'S  TREATMENT:  10-08-22: Target naming family members. Pt able to imitate x10 with direct model, recall and name 7/10 with delayed model following ONGOING training. Use of melodic intonation training to aid in clear productions of family members names. Pt with self-awareness of clarity of production and attempting self-correction x2. Generated x3 phrases which pt able to imitate with clarity: "you a mess, [place] Dales, looking cute today" to use as scripts with grandchildren.   10-06-22: Target naming of function home items. Given picture and FO2 written options, pt names item correctly in 3/3 trials. Continues to benefit from options being presented vertically to compensate for R visual cut. Pt achieved verbal approximation of named items in 3/3 trials with usual max-A. Following, able to verbally name 1/3 item with mod-I, additional x1 with delayed model. Perseveration impacting subsequent attempts to name with pt shaking head indicating awareness of perseveration. Reintroduced TD SNAP program with pt able to accurately select dictated items in 1/4 trials from [facility] with max verbal A for scanning. R neglect appears to be primary barrier at this time to augmentative communication device. PO trials of mixed consistencies without overt s/sx of aspiration and demonstrating good oral clearance. Trial thin liquids via cup and straw without overt s/sx of aspiration. Clear vocal quality remains throughout bolus administration in  sustained ah production. Probed ability to complete exercises, if indicated with pt unable to follow cues to swallow volitionally or complete chin tuck against resistance. As such, dyspahgia exercises appear to be contraindicated at this time. However, based on improvement during clinical administrations of thin liquids, and time since prior instrumental evaluation, recommend repeat instrumental swallow study to objectively assess swallow function. Ordered this date with education provided to pt and  spouse re: purpose and potential outcomes. If possible, request that esophageal screening be completed by [facility] administering clinician.    10-02-22: Increased frustration this date from previous sessio. Target response to personal ID questions. Pt able to verbalize name with and without verbal cues. Approximation of birthday with written cues and direct model. Verbalizes wife's name with visual A and direct model. Given picture representation of everyday objects, pt able to select correct actual object for FO4 5/6 trials. Given FO3 picture representations, pt able to ID correct object presented in 9/11 trials. Inattention appears to be barrier vs object discrimination abilities. Use of TD SNAP program to assess for AAC candidacy. Pt able to select appropriate verbally dictated icon from [place] with 90% accuracy. Incorrect selection d/t decreased technology proficiency at time but pt with verbal "no" and continued scanning for correct icon.   10-01-22: Trial thin liquid bolus trials today, from cup with pt self-feeding. Intermittent throat cleats but overall appears to have handled bolus trials well. Was unable to complete sequential sips. Continues to have barriers to exercises or implementation of swallow compensations 2/2 receptive language impairment and cognitive impairment. SLP recommends consideration of instrumental swallow study to objectively assess pt's swallow function.  Target naming and auditory comprehension of familiar objects. Pt able to repeat name of all objects with max-A. ID correct object from FO2 6/6 trials; ID correct object from [facility] 6/6 trials. ID correct object from [place] 3/6 trials. Following, pt able to name 12/18 objects given phonemic cues.     PATIENT EDUCATION: Education details: see above Person educated: Patient and Spouse Education method: Explanation, Demonstration, and Handouts Education comprehension: verbalized understanding, returned demonstration, and needs further  education   GOALS: Goals reviewed with patient? Yes   SHORT TERM GOALS: Target date: 10/12/2022   Pt will average 80% accuracy in practical naming tasks with usual mod-A over 2 sessions.  Baseline: 10-06-22, 10-08-22 Goal status: MET   2.  Spouse will teach back supportive communication strategies to aid in auditory comprehension at home with supervision A.   Baseline:  Goal status: ONGOING   3.  Pt will comprehend  written choice f:3 at word level with known context 80% to make accurate selections of preferences, answer questions at home with occasional min A Baseline: 10-08-22 Goal status: MET   4.  Pt will be intelligible in 2 word phrase production in 15/20 trials with direct model and usual mod-A  Baseline:  Goal status: ONGOING   5.  Pt will complete augmentative communication/SGD canidacy assessment to determine appropriate communication device to support communication of wants/needs/thoughts Baseline:  Goal status: ONGOING     LONG TERM GOALS: Target date: 11/30/2022    Pt will use AAC/multimodal communicate to communicate choices, wants at home with family and caregiver over 1 week Baseline:  Goal status: ONGOING   2.  Pt will name 5 items in personally relevant categories with occasional mod A over 2 sessions Baseline:  Goal status: ONGOING   3.  Pt will employ dysarthria strategies resulting in 50% intelligibility in self-generated words and phrases  with usual mod-A Baseline:  Goal status: ONGOING   4.  Pt will participate in clinical swallow evaluation to assess for candidacy for upgraded diet textures and liquid consistencies PRN Baseline:  Goal status: ONGOING     ASSESSMENT:   CLINICAL IMPRESSION: Patient is a 71 y.o. M who was seen today for cognitive linguistic and dysphagia evaluation following CVA. Pt presenting with severe non-fluent aphasia, moderate dysarthria, oropharyngeal dysphagia, and cognitive deficits in areas of attention, memory,  executive functioning.  Significant expressive > receptive language deficits impacting overall communication abilities. Pt spouse reporting usual frustration at home, though are employing yes/no questioning to determine wants/needs. Ongoing education and training to rehabilitate expressive and receptive language. Improving ability to repeat direct models and then subsequently name targeted items with short delay. SLP has engaged pt in variety of tasks to assess for speech generating device candidacy. At this time, R visual inattention primary barrier. Pt with improving object discrimination and ability to ID objects both real and pictured. Appears to be tolerating thin liquids during clincal swallow evaluation. Continues to not be stimulible for dysphagia exercises or strategies d/t impairment evidenced with following directions. Repeat instrumental swallow evaluation has been ordered to determine if diet upgrade is appropriate at this time. Severity of cognitive impairments continues to be difficult to assess 2/2 communication impairment. Pt benefiting from usual repetition, use of simple and direct language, visual and verbal cueing for participation in assessment. Ongoing education for spouse re: supportive communication strategies. SLP recommends continued skilled ST intervention course to facilitate improved communication efficacy, reduce caregiver burden, and improve QoL.    OBJECTIVE IMPAIRMENTS include attention, memory, executive functioning, aphasia, dysarthria, and dysphagia. These impairments are limiting patient from ADLs/IADLs, effectively communicating at home and in community, and safety when swallowing. Factors affecting potential to achieve goals and functional outcome are ability to learn/carryover information, co-morbidities, previous level of function, and financial resources. Patient will benefit from skilled SLP services to address above impairments and improve overall function.   REHAB  POTENTIAL: Fair co-morbidities   PLAN: SLP FREQUENCY: 2x/week   SLP DURATION: 12 weeks   PLANNED INTERVENTIONS: Aspiration precaution training, Pharyngeal strengthening exercises, Diet toleration management , Language facilitation, Trials of upgraded texture/liquids, Cueing hierachy, Cognitive reorganization, Internal/external aids, Oral motor exercises, Functional tasks, Multimodal communication approach, SLP instruction and feedback, Compensatory strategies, Patient/family education, and Re-evaluation   Su Monks, CCC-SLP 10/08/2022, 4:22 PM

## 2022-10-12 ENCOUNTER — Ambulatory Visit: Payer: Medicare HMO | Admitting: Speech Pathology

## 2022-10-12 ENCOUNTER — Ambulatory Visit: Payer: Medicare HMO | Admitting: Occupational Therapy

## 2022-10-12 ENCOUNTER — Ambulatory Visit: Payer: Medicare HMO

## 2022-10-12 DIAGNOSIS — R482 Apraxia: Secondary | ICD-10-CM

## 2022-10-12 DIAGNOSIS — I69351 Hemiplegia and hemiparesis following cerebral infarction affecting right dominant side: Secondary | ICD-10-CM

## 2022-10-12 DIAGNOSIS — R1310 Dysphagia, unspecified: Secondary | ICD-10-CM

## 2022-10-12 DIAGNOSIS — R4701 Aphasia: Secondary | ICD-10-CM | POA: Diagnosis not present

## 2022-10-12 DIAGNOSIS — R278 Other lack of coordination: Secondary | ICD-10-CM

## 2022-10-12 DIAGNOSIS — R41841 Cognitive communication deficit: Secondary | ICD-10-CM

## 2022-10-12 DIAGNOSIS — R41840 Attention and concentration deficit: Secondary | ICD-10-CM

## 2022-10-12 NOTE — Therapy (Signed)
OUTPATIENT OCCUPATIONAL THERAPY NEURO TREATMENT  Patient Name: Dustin Blanchard MRN: [id number] DOB:1951/11/27, 71 y.o., male Today's Date: 10/12/2022  PCP: [facility] REFERRING PROVIDER: Dr. Leeroy Cha   OT End of Session - 10/12/22 1402     Visit Number 7    Number of Visits 25    Date for OT Re-Evaluation 12/06/22    Authorization Type Aetna Medicare-- [place]- [place]; no auth; 100% coverage    Authorization - Visit Number 7    Authorization - Number of Visits 10    Progress Note Due on Visit 10    OT Start Time 1400    OT Stop Time 1445    OT Time Calculation (min) 45 min    Activity Tolerance Patient tolerated treatment well    Behavior During Therapy [facility] for tasks assessed/performed               Past Medical History:  Diagnosis Date   Dementia [facility])    Past Surgical History:  Procedure Laterality Date   HERNIA REPAIR     HIP SURGERY     IR CT HEAD LTD  08/06/2022   IR CT HEAD LTD  08/06/2022   IR PERCUTANEOUS ART THROMBECTOMY/INFUSION INTRACRANIAL INC DIAG ANGIO  08/06/2022   IR US GUIDE VASC ACCESS RIGHT  08/06/2022   RADIOLOGY WITH ANESTHESIA N/A 08/06/2022   Procedure: IR WITH ANESTHESIA;  Surgeon: Luanne Bras, MD;  Location: [place];  Service: Radiology;  Laterality: N/A;   Patient Active Problem List   Diagnosis Date Noted   Pneumonia of right lower lobe due to infectious organism    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   Sinus pause    Acute combined systolic and diastolic heart failure (HCC)    Atrial fibrillation with RVR (HCC)    Acute on chronic combined systolic and diastolic CHF (congestive heart failure) ([place])    Acute ischemic left MCA stroke ([place]) 08/06/2022    ONSET DATE: 08/06/22  REFERRING DIAG: I63.9 (ICD-10-CM) - CVA (cerebral vascular accident) ([place])   THERAPY DIAG:  Other lack of coordination  Attention and concentration deficit  Hemiplegia and hemiparesis following cerebral infarction affecting right  dominant side ([place])  SUBJECTIVE:   SUBJECTIVE STATEMENT: 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  Pt accompanied by: significant other    PERTINENT HISTORY: Presented to hospital 08/06/2022 with acute onset of right-sided weakness and aphasia. Cranial CT scan showed small age-indeterminate left PCA infarct of the left occipital lobe.  Patient did receive TNK as well as endovascular revascularization of occlusion of left M3 MCA middle division branch 08/06/2022 per interventional radiology. In hospital, cardiology service consulted for new A-fib RVR/CHF.    PMH:  hypertension hyperlipidemia sinus bradycardia/chronic CHF with mild regurgitation BPH history of tobacco use vascular dementia diagnosed 2022  PRECAUTIONS: Fall and Other: aphasia (particularly expressive), Dysphagia #1 nectar thick liquids   PAIN:  Are you having pain? No (Indicated by shaking head)   FALLS: Has patient fallen in last 6 months? Yes. Number of falls: 1, morning of CVA   OBJECTIVE:    HAND DOMINANCE: Right   TODAY'S TREATMENT:   10/12/2022 Pt limited in ability to participate in O.T. d/t severity of deficits including: aphasia, apraxia, Rt side neglect, ?visual deficits, and cognition.   Attempted letter cancellation but unable to follow simple verbal or demo cues w/ line guide Attempted PVC pipe design with pieces organized by therapist for more  mechanical task however pt required max visual and hand over hand cueing Pt asked to place colors of blocks in bowl placed to Rt side w/ Rt hand for tabletop scanning (only using 5 primary colors), but pt required constant cueing Reduced choices to down to 2 for separating cards into 2 color categories: pt required max simple verbal, visual, and hand over hand cueing  PATIENT EDUCATION: Education details: attention to Rt side and how to incorporate pt using Rt hand for ADLS and scanning to Rt side Person  educated: Spouse Education method: Explanation Education comprehension: verbalized understanding      HOME EXERCISE PROGRAM: 09/14/22:  RUE and bilateral hand functional use HEP    GOALS: Potential Goals reviewed with patient? Yes  SHORT TERM GOALS: Target date: 10/06/22; extended due to scheduling to 10/13/2022  Pt/caregiver will be independent with HEP for RUE functional use/coordination.   Goal status: INITIAL  2.  Pt/caregiver will verbalize understanding of visual compensation strategies and visual HEP. Goal status: INITIAL  3.  Pt will perform simple tabletop visual scanning with at least 85% accuracy. Goal status: INITIAL  4.  Pt will improve R grip strength to at least 25lbs for incr functional use. Baseline:  17.1lbs Goal status: INITIAL  5.  Pt will be able to consistently perform bilateral UE tasks with no more than min cueing (such as zipping jacket, using pump of hand sanitizer, folding towels, etc). Goal status: INITIAL    LONG TERM GOALS: Target date: 12/06/22  Pt/caregiver will be independent with HEP for RUE functional use/strength.  Goal status: INITIAL  2.  Pt/caregiver will verbalize understanding of strategies to incr safety/ease/participation with ADLs/IADLs. Goal status: INITIAL  3.  Pt will perform simple environmental scanning with at least 85% accuracy for incr safety. Goal status: INITIAL  4.  Pt will improve RUE coordination/functional use and cognition as shown by improving score on box and blocks to 15 with RUE. Baseline: 5 blocks Goal status: INITIAL  5.  Pt will use RUE as dominant UE for functional tasks at least 90% of the time. Goal status: INITIAL  6.  Pt will be able to perform BADLs with no more than distant supervision. Goal status: INITIAL  ASSESSMENT:  CLINICAL IMPRESSION: Patient remains limited by apraxia, right inattention/ visual deficits, cognition and language deficits; however, is willing to participate in  skilled therapy as needed to promote progression towards goals with overall goal of improving his independence and safety with BADL. Pt  seems to perform better with familiar, mechanical tasks and/or habitual tasks. Would recommend incorporation of similar tasks into future treatments.   PERFORMANCE DEFICITS in functional skills including ADLs, IADLs, coordination, dexterity, proprioception, strength, FMC, GMC, mobility, balance, decreased knowledge of precautions, decreased knowledge of use of DME, vision, and UE functional use, cognitive skills including attention, learn, memory, perception, problem solving, safety awareness, and understand, and psychosocial skills including environmental adaptation, habits, and routines and behaviors.   IMPAIRMENTS are limiting patient from ADLs, IADLs, and leisure.   COMORBIDITIES has co-morbidities such as vascular dementia  that affects occupational performance. Patient will benefit from skilled OT to address above impairments and improve overall function.  REHAB POTENTIAL: Good   PLAN: OT FREQUENCY: 2x/week  OT DURATION: 12 weeks +eval  PLANNED INTERVENTIONS: self care/ADL training, therapeutic exercise, therapeutic activity, neuromuscular re-education, passive range of motion, balance training, functional mobility training, fluidotherapy, moist heat, cryotherapy, patient/family education, cognitive remediation/compensation, visual/perceptual remediation/compensation, energy conservation, coping strategies training, and DME and/or AE instructions  RECOMMENDED OTHER SERVICES: current with Speech therapy, needs to schedule PT (referral in Epic)  CONSULTED AND AGREED WITH PLAN OF CARE: Patient and family member/caregiver  PLAN FOR NEXT SESSION:  work on [employer] and light IADLS (folding clothes), continue nuts/bolts, tossing ball. Consider placing on hold within 2-3 visits d/t severity of deficits impacting performance and ability to progress   Hans Eden, OTR/L 10/12/2022, 2:03 PM

## 2022-10-12 NOTE — Therapy (Signed)
OUTPATIENT SPEECH LANGUAGE PATHOLOGY TREATMENT NOTE   Patient Name: Dustin Blanchard MRN: [id number] DOB:Apr 20, 1951, 71 y.o., male Today's Date: 10/13/2022  PCP: Inc, [place] PROVIDER: Vaughan Basta, Edman Circle, PA-C   END OF SESSION:   End of Session - 10/12/22 1317     Visit Number 10    Number of Visits 25    Date for SLP Re-Evaluation 11/30/22    Authorization Type [employer]    Progress Note Due on Visit 10    SLP Start Time 1318    SLP Stop Time  1400    SLP Time Calculation (min) 42 min    Activity Tolerance Patient tolerated treatment well                   Past Medical History:  Diagnosis Date   Dementia [facility])    Past Surgical History:  Procedure Laterality Date   HERNIA REPAIR     HIP SURGERY     IR CT HEAD LTD  08/06/2022   IR CT HEAD LTD  08/06/2022   IR PERCUTANEOUS ART THROMBECTOMY/INFUSION INTRACRANIAL INC DIAG ANGIO  08/06/2022   IR US GUIDE VASC ACCESS RIGHT  08/06/2022   RADIOLOGY WITH ANESTHESIA N/A 08/06/2022   Procedure: IR WITH ANESTHESIA;  Surgeon: Luanne Bras, MD;  Location: [place];  Service: Radiology;  Laterality: N/A;   Patient Active Problem List   Diagnosis Date Noted   Pneumonia of right lower lobe due to infectious organism    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 Spouse cueing for small sips.   PAIN:  Are you  having pain? No  OBJECTIVE:   TODAY'S TREATMENT:  10-12-22: SLP provides education on aspiration precautions for thin liquid trials at home. Encourage dental care, to include visit to dentist, be active, small sips, PO only when upright. Spouse verbalizes understanding. Target verbal expression through training of modified melodic intonation therapy. SLP provides max-A verbal, tactile, and visual cues, for tapping to metronome and humming basic melody. Pt achieved humming and consonant /s/ in tune to melody with max-A faded to mod-I in 8/10 trials for both. Target phrase "some water please" with SLP able to fade choral production for mod-I production from pt in 4/6 trials. Able to vocalize full name in 4/7 trials with melody with max, faded to min-A.   10-08-22: Target naming family members. Pt able to imitate x10 with direct model, recall and name 7/10 with delayed model following ONGOING training. Use of melodic intonation training to aid in clear productions of family members names. Pt with self-awareness of clarity of production and attempting self-correction x2. Generated x3 phrases which pt able to imitate with clarity: "you a mess, Thornton Dales, looking cute today" to use as scripts with grandchildren.   10-06-22: Target naming of function home items. Given picture and FO2 written options, pt names item correctly in 3/3 trials. Continues to benefit  from options being presented vertically to compensate for R visual cut. Pt achieved verbal approximation of named items in 3/3 trials with usual max-A. Following, able to verbally name 1/3 item with mod-I, additional x1 with delayed model. Perseveration impacting subsequent attempts to name with pt shaking head indicating awareness of perseveration. Reintroduced TD SNAP program with pt able to accurately select dictated items in 1/4 trials from [facility] with max verbal A for scanning. R neglect appears to be primary barrier at this time to augmentative  communication device. PO trials of mixed consistencies without overt s/sx of aspiration and demonstrating good oral clearance. Trial thin liquids via cup and straw without overt s/sx of aspiration. Clear vocal quality remains throughout bolus administration in sustained ah production. Probed ability to complete exercises, if indicated with pt unable to follow cues to swallow volitionally or complete chin tuck against resistance. As such, dyspahgia exercises appear to be contraindicated at this time. However, based on improvement during clinical administrations of thin liquids, and time since prior instrumental evaluation, recommend repeat instrumental swallow study to objectively assess swallow function. Ordered this date with education provided to pt and spouse re: purpose and potential outcomes. If possible, request that esophageal screening be completed by [facility] administering clinician.    10-02-22: Increased frustration this date from previous sessio. Target response to personal ID questions. Pt able to verbalize name with and without verbal cues. Approximation of birthday with written cues and direct model. Verbalizes wife's name with visual A and direct model. Given picture representation of everyday objects, pt able to select correct actual object for FO4 5/6 trials. Given FO3 picture representations, pt able to ID correct object presented in 9/11 trials. Inattention appears to be barrier vs object discrimination abilities. Use of TD SNAP program to assess for AAC candidacy. Pt able to select appropriate verbally dictated icon from [place] with 90% accuracy. Incorrect selection d/t decreased technology proficiency at time but pt with verbal "no" and continued scanning for correct icon.   10-01-22: Trial thin liquid bolus trials today, from cup with pt self-feeding. Intermittent throat cleats but overall appears to have handled bolus trials well. Was unable to complete sequential sips. Continues to have  barriers to exercises or implementation of swallow compensations 2/2 receptive language impairment and cognitive impairment. SLP recommends consideration of instrumental swallow study to objectively assess pt's swallow function.  Target naming and auditory comprehension of familiar objects. Pt able to repeat name of all objects with max-A. ID correct object from FO2 6/6 trials; ID correct object from [facility] 6/6 trials. ID correct object from [place] 3/6 trials. Following, pt able to name 12/18 objects given phonemic cues.     PATIENT EDUCATION: Education details: see above Person educated: Patient and Spouse Education method: Explanation, Demonstration, and Handouts Education comprehension: verbalized understanding, returned demonstration, and needs further education   GOALS: Goals reviewed with patient? Yes   SHORT TERM GOALS: Target date: 10/12/2022   Pt will average 80% accuracy in practical naming tasks with usual mod-A over 2 sessions.  Baseline: 10-06-22, 10-08-22 Goal status: MET   2.  Spouse will teach back supportive communication strategies to aid in auditory comprehension at home with supervision A.   Baseline: 10-12-22 Goal status: MET   3.  Pt will comprehend  written choice f:3 at word level with known context 80% to make accurate selections of preferences, answer questions at home with occasional min A Baseline: 10-08-22 Goal status: MET   4.  Pt will be intelligible in  2 word phrase production in 15/20 trials with direct model and usual mod-A  Baseline: 10-12-22 Goal status: PARTIALLY MET   5.  Pt will complete augmentative communication/SGD canidacy assessment to determine appropriate communication device to support communication of wants/needs/thoughts Baseline: 10-12-22 Goal status: NOT MET, ONGOING     LONG TERM GOALS: Target date: 11/30/2022    Pt will use AAC/multimodal communicate to communicate choices, wants at home with family and caregiver over 1  week Baseline:  Goal status: ONGOING   2.  Pt will name 5 items in personally relevant categories with occasional mod A over 2 sessions Baseline:  Goal status: ONGOING   3.  Pt will employ dysarthria strategies resulting in 50% intelligibility in self-generated words and phrases with usual mod-A Baseline:  Goal status: ONGOING   4.  Pt will participate in clinical swallow evaluation to assess for candidacy for upgraded diet textures and liquid consistencies PRN Baseline:  Goal status: ONGOING     ASSESSMENT:   CLINICAL IMPRESSION: Patient is a 71 y.o. M who was seen today for cognitive linguistic and dysphagia evaluation following CVA. Pt presenting with severe non-fluent aphasia, moderate dysarthria, oropharyngeal dysphagia, and cognitive deficits in areas of attention, memory, executive functioning.  Significant expressive > receptive language deficits impacting overall communication abilities. Pt spouse reporting usual frustration at home, though are employing yes/no questioning to determine wants/needs. Ongoing education and training to rehabilitate expressive and receptive language. Improving ability to repeat direct models and then subsequently name targeted items with short delay. SLP has engaged pt in variety of tasks to assess for speech generating device candidacy. At this time, R visual inattention primary barrier. Pt with improving object discrimination and ability to ID objects both real and pictured. Appears to be tolerating thin liquids during clincal swallow evaluation. Continues to not be stimulible for dysphagia exercises or strategies d/t impairment evidenced with following directions. Repeat instrumental swallow evaluation has been ordered to determine if diet upgrade is appropriate at this time. Severity of cognitive impairments continues to be difficult to assess 2/2 communication impairment. Pt benefiting from usual repetition, use of simple and direct language, visual and  verbal cueing for participation in assessment. Ongoing education for spouse re: supportive communication strategies. SLP recommends continued skilled ST intervention course to facilitate improved communication efficacy, reduce caregiver burden, and improve QoL.    OBJECTIVE IMPAIRMENTS include attention, memory, executive functioning, aphasia, dysarthria, and dysphagia. These impairments are limiting patient from ADLs/IADLs, effectively communicating at home and in community, and safety when swallowing. Factors affecting potential to achieve goals and functional outcome are ability to learn/carryover information, co-morbidities, previous level of function, and financial resources. Patient will benefit from skilled SLP services to address above impairments and improve overall function.   REHAB POTENTIAL: Fair co-morbidities   PLAN: SLP FREQUENCY: 2x/week   SLP DURATION: 12 weeks   PLANNED INTERVENTIONS: Aspiration precaution training, Pharyngeal strengthening exercises, Diet toleration management , Language facilitation, Trials of upgraded texture/liquids, Cueing hierachy, Cognitive reorganization, Internal/external aids, Oral motor exercises, Functional tasks, Multimodal communication approach, SLP instruction and feedback, Compensatory strategies, Patient/family education, and Re-evaluation   Su Monks, CCC-SLP 10/13/2022, 8:06 AM

## 2022-10-14 ENCOUNTER — Encounter: Payer: Self-pay | Admitting: Occupational Therapy

## 2022-10-14 ENCOUNTER — Ambulatory Visit: Payer: Medicare HMO

## 2022-10-14 ENCOUNTER — Ambulatory Visit: Payer: Medicare HMO | Admitting: Speech Pathology

## 2022-10-14 ENCOUNTER — Ambulatory Visit: Payer: Medicare HMO | Admitting: Occupational Therapy

## 2022-10-14 DIAGNOSIS — I69351 Hemiplegia and hemiparesis following cerebral infarction affecting right dominant side: Secondary | ICD-10-CM

## 2022-10-14 DIAGNOSIS — R278 Other lack of coordination: Secondary | ICD-10-CM

## 2022-10-14 DIAGNOSIS — R2681 Unsteadiness on feet: Secondary | ICD-10-CM

## 2022-10-14 DIAGNOSIS — R4701 Aphasia: Secondary | ICD-10-CM | POA: Diagnosis not present

## 2022-10-14 DIAGNOSIS — M6281 Muscle weakness (generalized): Secondary | ICD-10-CM

## 2022-10-14 DIAGNOSIS — R41841 Cognitive communication deficit: Secondary | ICD-10-CM

## 2022-10-14 DIAGNOSIS — R2689 Other abnormalities of gait and mobility: Secondary | ICD-10-CM

## 2022-10-14 DIAGNOSIS — R482 Apraxia: Secondary | ICD-10-CM

## 2022-10-14 DIAGNOSIS — R41840 Attention and concentration deficit: Secondary | ICD-10-CM

## 2022-10-14 NOTE — Therapy (Signed)
OUTPATIENT SPEECH LANGUAGE PATHOLOGY TREATMENT NOTE   Patient Name: Dustin Blanchard MRN: [id number] DOB:[birth year], 71 y.o., male Today's Date: 10/14/2022  PCP: Inc, [place] PROVIDER: Vaughan Basta, Ed[place] Circle, PA-C   END OF SESSION:   End of Session - 10/14/22 1444     Visit Number 11    Number of Visits 25    Date for SLP Re-Evaluation 11/30/22    Authorization Type [employer] Medicare    Progress Note Due on Visit 10    SLP Start Time 1445    SLP Stop Time  1530    SLP Time Calculation (min) 45 min    Activity Tolerance Patient tolerated treatment well                   Past Medical History:  Diagnosis Date   Dementia [facility])    Past Surgical History:  Procedure Laterality Date   HERNIA REPAIR     HIP SURGERY     IR CT HEAD LTD  08/06/2022   IR CT HEAD LTD  08/06/2022   IR PERCUTANEOUS ART THROMBECTOMY/INFUSION INTRACRANIAL INC DIAG ANGIO  08/06/2022   IR US GUIDE VASC ACCESS RIGHT  08/06/2022   RADIOLOGY WITH ANESTHESIA N/A 08/06/2022   Procedure: IR WITH ANESTHESIA;  Surgeon: Luanne Bras, MD;  Location: [place];  Service: Radiology;  Laterality: N/A;   Patient Active Problem List   Diagnosis Date Noted   Pneumonia of right lower lobe due to infectious organism    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 No  OBJECTIVE:   TODAY'S TREATMENT:  10-14-22: Continued use of  modified melodic intonation therapy to increase verbal expression abilities. Pt with recall of previously target phrase, able to demonstrate x2 with min-A. Target phrase" how are you today," with pt able to demonstrate x5 with mod-I following systematic instruction to include choral production. Excellent awareness of errors with pt re-attempting without SLP cues.Target receptive ID of everyday objects, with pt demonstrating accurate ID of object in 4/6 trials with usual repetition and occasional needs for written support. X1 phrase completion to name 5/6 objects. Final object requires direct model and written support. Semantic cues A pt in naming of object x1.   10-12-22: SLP provides education on aspiration precautions for thin liquid trials at home. Encourage dental care, to include visit to dentist, be active, small sips, PO only when upright. Spouse verbalizes understanding. Target verbal expression through training of modified melodic intonation therapy. SLP provides max-A verbal, tactile, and visual cues, for tapping to metronome and humming basic melody. Pt achieved humming and consonant /s/ in tune to melody with max-A faded to mod-I in 8/10 trials for both. Target phrase "some water please" with SLP able to fade choral production for mod-I production from pt in 4/6 trials. Able to vocalize full name in 4/7 trials with melody with max, faded to min-A.   10-08-22: Target naming family  members. Pt able to imitate x10 with direct model, recall and name 7/10 with delayed model following ONGOING training. Use of melodic intonation training to aid in clear productions of family members names. Pt with self-awareness of clarity of production and attempting self-correction x2. Generated x3 phrases which pt able to imitate with clarity: "you a mess, Thornton Dales, looking cute today" to use as scripts with grandchildren.   10-06-22: Target naming of function home items. Given picture and FO2 written options, pt  names item correctly in 3/3 trials. Continues to benefit from options being presented vertically to compensate for R visual cut. Pt achieved verbal approximation of named items in 3/3 trials with usual max-A. Following, able to verbally name 1/3 item with mod-I, additional x1 with delayed model. Perseveration impacting subsequent attempts to name with pt shaking head indicating awareness of perseveration. Reintroduced TD SNAP program with pt able to accurately select dictated items in 1/4 trials from [facility] with max verbal A for scanning. R neglect appears to be primary barrier at this time to augmentative communication device. PO trials of mixed consistencies without overt s/sx of aspiration and demonstrating good oral clearance. Trial thin liquids via cup and straw without overt s/sx of aspiration. Clear vocal quality remains throughout bolus administration in sustained ah production. Probed ability to complete exercises, if indicated with pt unable to follow cues to swallow volitionally or complete chin tuck against resistance. As such, dyspahgia exercises appear to be contraindicated at this time. However, based on improvement during clinical administrations of thin liquids, and time since prior instrumental evaluation, recommend repeat instrumental swallow study to objectively assess swallow function. Ordered this date with education provided to pt and spouse re: purpose and potential outcomes. If possible, request that esophageal screening be completed by [facility] administering clinician.    10-02-22: Increased frustration this date from previous sessio. Target response to personal ID questions. Pt able to verbalize name with and without verbal cues. Approximation of birthday with written cues and direct model. Verbalizes wife's name with visual A and direct model. Given picture representation of everyday objects, pt able to select correct actual object for FO4 5/6 trials. Given FO3 picture representations, pt  able to ID correct object presented in 9/11 trials. Inattention appears to be barrier vs object discrimination abilities. Use of TD SNAP program to assess for AAC candidacy. Pt able to select appropriate verbally dictated icon from [place] with 90% accuracy. Incorrect selection d/t decreased technology proficiency at time but pt with verbal "no" and continued scanning for correct icon.   10-01-22: Trial thin liquid bolus trials today, from cup with pt self-feeding. Intermittent throat cleats but overall appears to have handled bolus trials well. Was unable to complete sequential sips. Continues to have barriers to exercises or implementation of swallow compensations 2/2 receptive language impairment and cognitive impairment. SLP recommends consideration of instrumental swallow study to objectively assess pt's swallow function.  Target naming and auditory comprehension of familiar objects. Pt able to repeat name of all objects with max-A. ID correct object from FO2 6/6 trials; ID correct object from [facility] 6/6 trials. ID correct object from [place] 3/6 trials. Following, pt able to name 12/18 objects given phonemic cues.     PATIENT EDUCATION: Education details: see above Person educated: Patient and Spouse Education method: Explanation, Demonstration, and Handouts Education comprehension: verbalized understanding, returned demonstration, and needs further education   GOALS: Goals reviewed with patient? Yes   SHORT TERM GOALS: Target date: 10/12/2022   Pt will average  80% accuracy in practical naming tasks with usual mod-A over 2 sessions.  Baseline: 10-06-22, 10-08-22 Goal status: MET   2.  Spouse will teach back supportive communication strategies to aid in auditory comprehension at home with supervision A.   Baseline: 10-12-22 Goal status: MET   3.  Pt will comprehend  written choice f:3 at word level with known context 80% to make accurate selections of preferences, answer questions at home with  occasional min A Baseline: 10-08-22 Goal status: MET   4.  Pt will be intelligible in 2 word phrase production in 15/20 trials with direct model and usual mod-A  Baseline: 10-12-22 Goal status: PARTIALLY MET   5.  Pt will complete augmentative communication/SGD canidacy assessment to determine appropriate communication device to support communication of wants/needs/thoughts Baseline: 10-12-22 Goal status: NOT MET, ONGOING     LONG TERM GOALS: Target date: 11/30/2022    Pt will use AAC/multimodal communicate to communicate choices, wants at home with family and caregiver over 1 week Baseline:  Goal status: ONGOING   2.  Pt will name 5 items in personally relevant categories with occasional mod A over 2 sessions Baseline:  Goal status: ONGOING   3.  Pt will employ dysarthria strategies resulting in 50% intelligibility in self-generated words and phrases with usual mod-A Baseline:  Goal status: ONGOING   4.  Pt will participate in clinical swallow evaluation to assess for candidacy for upgraded diet textures and liquid consistencies PRN Baseline:  Goal status: ONGOING     ASSESSMENT:   CLINICAL IMPRESSION: Patient is a 71 y.o. M who was seen today for cognitive linguistic and dysphagia evaluation following CVA. Pt presenting with severe non-fluent aphasia, moderate dysarthria, oropharyngeal dysphagia, and cognitive deficits in areas of attention, memory, executive functioning.  Significant expressive > receptive language deficits impacting overall communication abilities. Pt spouse reporting usual frustration at home, though are employing yes/no questioning to determine wants/needs. Ongoing education and training to rehabilitate expressive and receptive language. Improving ability to repeat direct models and then subsequently name targeted items with short delay. SLP has engaged pt in variety of tasks to assess for speech generating device candidacy. At this time, R visual inattention  primary barrier. Pt with improving object discrimination and ability to ID objects both real and pictured. Appears to be tolerating thin liquids during clincal swallow evaluation. Continues to not be stimulible for dysphagia exercises or strategies d/t impairment evidenced with following directions. Repeat instrumental swallow evaluation has been ordered to determine if diet upgrade is appropriate at this time. Severity of cognitive impairments continues to be difficult to assess 2/2 communication impairment. Pt benefiting from usual repetition, use of simple and direct language, visual and verbal cueing for participation in assessment. Ongoing education for spouse re: supportive communication strategies. SLP recommends continued skilled ST intervention course to facilitate improved communication efficacy, reduce caregiver burden, and improve QoL.    OBJECTIVE IMPAIRMENTS include attention, memory, executive functioning, aphasia, dysarthria, and dysphagia. These impairments are limiting patient from ADLs/IADLs, effectively communicating at home and in community, and safety when swallowing. Factors affecting potential to achieve goals and functional outcome are ability to learn/carryover information, co-morbidities, previous level of function, and financial resources. Patient will benefit from skilled SLP services to address above impairments and improve overall function.   REHAB POTENTIAL: Fair co-morbidities   PLAN: SLP FREQUENCY: 2x/week   SLP DURATION: 12 weeks   PLANNED INTERVENTIONS: Aspiration precaution training, Pharyngeal strengthening exercises, Diet toleration management , Language facilitation, Trials of upgraded  texture/liquids, Cueing hierachy, Cognitive reorganization, Internal/external aids, Oral motor exercises, Functional tasks, Multimodal communication approach, SLP instruction and feedback, Compensatory strategies, Patient/family education, and Re-evaluation   Su Monks,  CCC-SLP 10/14/2022, 2:45 PM

## 2022-10-14 NOTE — Therapy (Signed)
OUTPATIENT PHYSICAL THERAPY NEURO TREATMENT   Patient Name: Dustin Blanchard MRN: [id number] DOB:Nov 08, 1951, 71 y.o., male Today's Date: 10/14/2022   PCP: Dr. Loletha Grayer at [place] PROVIDER: Barbie Banner, PA-C    PT End of Session - 10/14/22 1359     Visit Number 9    Number of Visits 17    Date for PT Re-Evaluation 11/23/22    Authorization Type [employer] Medicare    Progress Note Due on Visit 10    PT Start Time 1400    PT Stop Time 1442    PT Time Calculation (min) 42 min    Equipment Utilized During Treatment Gait belt    Activity Tolerance Patient tolerated treatment well    Behavior During Therapy WFL for tasks assessed/performed                  Past Medical History:  Diagnosis Date   Dementia [facility])    Past Surgical History:  Procedure Laterality Date   HERNIA REPAIR     HIP SURGERY     IR CT HEAD LTD  08/06/2022   IR CT HEAD LTD  08/06/2022   IR PERCUTANEOUS ART THROMBECTOMY/INFUSION INTRACRANIAL INC DIAG ANGIO  08/06/2022   IR US GUIDE VASC ACCESS RIGHT  08/06/2022   RADIOLOGY WITH ANESTHESIA N/A 08/06/2022   Procedure: IR WITH ANESTHESIA;  Surgeon: Luanne Bras, MD;  Location: [place];  Service: Radiology;  Laterality: N/A;   Patient Active Problem List   Diagnosis Date Noted   Pneumonia of right lower lobe due to infectious organism    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   Sinus pause    Acute combined systolic and diastolic heart failure (HCC)    Atrial fibrillation with RVR (HCC)    Acute on chronic combined systolic and diastolic CHF (congestive heart failure) ([place])    Acute ischemic left MCA stroke ([place]) 08/06/2022    ONSET DATE: 08/26/2022   REFERRING DIAG: I63.9 (ICD-10-CM) - CVA (cerebral vascular accident) ([place])   THERAPY DIAG:  Other lack of coordination  Unsteadiness on feet  Muscle weakness (generalized)  Other abnormalities of gait and mobility  Rationale for Evaluation and Treatment  Rehabilitation  SUBJECTIVE:                                                                                                                                                                                              SUBJECTIVE STATEMENT: Patient reports doing well- wife filling in information for patient at times. No falls/ near falls.   Pt accompanied by: significant other wife Dustin Blanchard  PERTINENT HISTORY:  hypertension, hyperlipidemia, sinus bradycardia/chronic CHF with mild regurgitation, BPH, history of tobacco use, vascular dementia diagnosed 2022   PAIN:  Are you having pain? No   PRECAUTIONS: Fall and Other: aphasia, dysphagia 1 (nectar-thick)  WEIGHT BEARING RESTRICTIONS No  FALLS: Has patient fallen in last 6 months? Yes. Number of falls one fall when he had the stroke, no other falls  PLOF: Needs assistance with gait, Needs assistance with transfers, and uses RW in the home with Supervision, uses transport chair for community mobility  PATIENT GOALS "to get him back to moving and walking better"  OBJECTIVE:   TODAY'S TREATMENT:  Goal assessment:  [facility] PT Assessment - 10/14/22 0001       Standardized Balance Assessment   Standardized Balance Assessment 10 meter walk test    Five times sit to stand comments  17.97s with BUE    10 Meter Walk 2.67f/s or .866m      Berg Balance Test   Sit to Stand Able to stand without using hands and stabilize independently    Standing Unsupported Able to stand safely 2 minutes    Sitting with Back Unsupported but Feet Supported on Floor or Stool Able to sit safely and securely 2 minutes    Stand to Sit Sits safely with minimal use of hands    Transfers Able to transfer safely, minor use of hands    Standing Unsupported with Eyes Closed Able to stand 10 seconds safely    Standing Unsupported with Feet Together Able to place feet together independently and stand 1 minute safely    From Standing, Reach Forward with Outstretched Arm Can  reach forward >5 cm safely (2")    From Standing Position, Pick up Object from Floor Able to pick up shoe, needs supervision    From Standing Position, Turn to Look Behind Over each Shoulder Looks behind one side only/other side shows less weight shift    Turn 360 Degrees Able to turn 360 degrees safely but slowly    Standing Unsupported, Alternately Place Feet on Step/Stool Able to stand independently and complete 8 steps >20 seconds    Standing Unsupported, One Foot in Front Able to plae foot ahead of the other independently and hold 30 seconds    Standing on One Leg Able to lift leg independently and hold > 10 seconds    Total Score 48      Timed Up and Go Test   Normal TUG (seconds) 12.97            -TotalA to complete edges of puzzle noting poor problem solving and fluctuating R attention and visual scanning    PATIENT EDUCATION: Education details: exam findings Person educated: Patient and Spouse Education method: ExCustomer service managerducation comprehension: verbalized understanding, returned demonstration, verbal cues required, and needs further education   HOME EXERCISE PROGRAM: Access Code: [email or web address]: [email or web address] Date: 09/17/2022 Prepared by: TaExcell SeltzerExercises - Side Stepping with Counter Support  - 1 x daily - 7 x weekly - 1 sets - 5 reps - Tandem Walking with Counter Support  - 1 x daily - 7 x weekly - 1 sets - 5 reps - Alternating Step Taps with Counter Support  - 1 x daily - 7 x weekly - 2 sets - 10 reps   GOALS: Goals reviewed with patient? Yes  SHORT TERM GOALS: Target date: 10/12/2022  Pt will be independent with initial HEP for improved strength, balance, transfers and gait.  Baseline: provided Goal status:  MET  2.  Pt will improve gait velocity to at least 2.0 ft/sec for improved gait efficiency and performance at Supervision level   Baseline: 1.82 ft/sec (9/25); 2.76f/s or .860m Goal status:  MET  3.  Pt will improve normal TUG to less than or equal to 30 seconds for improved functional mobility and decreased fall risk.  Baseline: 40.43 sec (9/25); 12.97s Goal status: MET  4.  Pt will improve Berg score to 30/56 for decreased fall risk  Baseline: 26/56 (9/25); 48/56 Goal status: MET  5.  Pt will improve 5 x STS to less than or equal to 30 seconds to demonstrate improved functional strength and transfer efficiency.   Baseline: 34.62 sec (9/25)17.97s with B UE Goal status: MET   LONG TERM GOALS: Target date: 11/09/2022  Pt will be independent with final HEP for improved strength, balance, transfers and gait.  Baseline:  Goal status: INITIAL  2.  Pt will improve gait velocity to at least 3.4f29f  for improved gait efficiency and performance at Supervision level   Baseline: 1.82 ft/sec (9/25) Goal status: REVISED  3.  Pt will improve normal TUG to less than or equal to 9 seconds for improved functional mobility and decreased fall risk.  Baseline: 40.43 sec (9/25) Goal status: REVISED  4.  Pt will improve Berg score to 52/56 for decreased fall risk  Baseline: 26/56 (925) Goal status: REVISED  5.  Pt will improve 5 x STS to less than or equal to 12 seconds to demonstrate improved functional strength and transfer efficiency.   Baseline: 34.62 sec (9/25) Goal status: REVISED   ASSESSMENT:  CLINICAL IMPRESSION: Patient seen for skilled PT session with emphasis on goal assessment and R visual scanning. Patient demonstrated increased fall risk noted by score of 48[facility]56 on the [facility].  <45/56 = fall risk, <42/56 = predictive of recurrent falls, <40/56 = 100% fall risk  >41 = independent, 21-40 = assistive device, 0-20 = wheelchair level  MDC 6.9 (4 pts 45-56, 5 pts 35-44, 7 pts 25-34) (ANPTA Core Set of Outcome Measures for Adults with Neurologic Conditions, 2018). Patient completed the Timed Up and Go test (TUG) in 12.97 seconds.  Geriatrics: need for  further assessment of fall risk: ? 12 sec; Recurrent falls: > 15 sec; Vestibular Disorders fall risk: > 15 sec; Parkinson's Disease fall risk: > 16 sec ([email or web address]). 10 Meter Walk Test: Patient instructed to walk 10 meters (32.8 ft) as quickly and as safely as possible at their normal speed x2 and at a fast speed x2. Time measured from 2 meter mark to 8 meter mark to accommodate ramp-up and ramp-down.  Normal speed: .66m62mut off scores: <0.4 m/s = household Ambulator, 0.4-0.8 m/s = limited community Ambulator, >0.8 m/s = community Ambulator, >1.2 m/s = crossing a street, <1.0 = increased fall risk MCID 0.05 m/s (small), 0.13 m/s (moderate), 0.06 m/s (significant)  (ANPTA Core Set of Outcome Measures for Adults with Neurologic Conditions, 2018). Five times Sit to Stand Test (FTSS) Method: Use a straight back chair with a solid seat that is 17-18" high. Ask participant to sit on the chair with arms folded across their chest.   Instructions: "Stand up and sit down as quickly as possible 5 times, keeping your arms folded across your chest."   Measurement: Stop timing when the participant touches the chair in sitting the 5th time.  TIME: 17.97 sec  Cut off scores indicative of increased fall risk: >12 sec CVA, >16 sec  PD, >13 sec vestibular (ANPTA Core Set of Outcome Measures for Adults with Neurologic Conditions, 2018). Continue POC.    OBJECTIVE IMPAIRMENTS Abnormal gait, decreased balance, decreased cognition, decreased knowledge of condition, decreased knowledge of use of DME, decreased mobility, decreased strength, decreased safety awareness, and impaired vision/preception.   ACTIVITY LIMITATIONS carrying, lifting, bending, standing, squatting, stairs, and transfers  PARTICIPATION LIMITATIONS: meal prep, cleaning, laundry, medication management, interpersonal relationship, driving, shopping, community activity, and yard work  PERSONAL FACTORS 1-2 comorbidities:    PMH that includes:   hypertension, hyperlipidemia, sinus bradycardia/chronic CHF with mild regurgitation, BPH, history of tobacco use, vascular dementia diagnosed 2022 are also affecting patient's functional outcome.   REHAB POTENTIAL: Fair history of dementia and severity of R neglect/inattention  CLINICAL DECISION MAKING: Stable/uncomplicated  EVALUATION COMPLEXITY: Moderate  PLAN: PT FREQUENCY: 2x/week  PT DURATION: 8 weeks  PLANNED INTERVENTIONS: Therapeutic exercises, Therapeutic activity, Neuromuscular re-education, Balance training, Gait training, Patient/Family education, Self Care, Joint mobilization, Stair training, Vestibular training, Canalith repositioning, Visual/preceptual remediation/compensation, Orthotic/Fit training, DME instructions, Aquatic Therapy, Dry Needling, Cognitive remediation, Electrical stimulation, Wheelchair mobility training, Cryotherapy, Moist heat, Taping, Manual therapy, and Re-evaluation  PLAN FOR NEXT SESSION: Seated and standing R inattention tasks. add to HEP for balance and R attention, work on [employer], visual scanning to the R and attention to R side, safety with transfers and mobility with LRAD, enjoys basketball so incorporating that or other sports into treatment session, sweeping? Golf? Carrying firewood through obstacle course (per wife he would carry it down an incline)   Debbora Dus, PT, DPT Debbora Dus, PT, DPT, CBIS  10/14/2022, 3:31 PM

## 2022-10-14 NOTE — Therapy (Signed)
OUTPATIENT OCCUPATIONAL THERAPY NEURO TREATMENT  Patient Name: Dustin Blanchard MRN: [id number] DOB:09-20-1951, 71 y.o., male Today's Date: 10/14/2022  PCP: [facility] PROVIDER: Dr. Leeroy Cha   OT End of Session - 10/14/22 1322     Visit Number 8    Number of Visits 25    Date for OT Re-Evaluation 12/06/22    Authorization Type Aetna Medicare-- [place]- [place]; no auth; 100% coverage    Authorization - Visit Number 8    Authorization - Number of Visits 10    Progress Note Due on Visit 10    OT Start Time 1315    OT Stop Time 1400    OT Time Calculation (min) 45 min    Activity Tolerance Patient tolerated treatment well    Behavior During Therapy [facility] for tasks assessed/performed               Past Medical History:  Diagnosis Date   Dementia [facility])    Past Surgical History:  Procedure Laterality Date   HERNIA REPAIR     HIP SURGERY     IR CT HEAD LTD  08/06/2022   IR CT HEAD LTD  08/06/2022   IR PERCUTANEOUS ART THROMBECTOMY/INFUSION INTRACRANIAL INC DIAG ANGIO  08/06/2022   IR US GUIDE VASC ACCESS RIGHT  08/06/2022   RADIOLOGY WITH ANESTHESIA N/A 08/06/2022   Procedure: IR WITH ANESTHESIA;  Surgeon: Luanne Bras, MD;  Location: [place];  Service: Radiology;  Laterality: N/A;   Patient Active Problem List   Diagnosis Date Noted   Pneumonia of right lower lobe due to infectious organism    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   Sinus pause    Acute combined systolic and diastolic heart failure (HCC)    Atrial fibrillation with RVR (HCC)    Acute on chronic combined systolic and diastolic CHF (congestive heart failure) ([place])    Acute ischemic left MCA stroke ([place]) 08/06/2022    ONSET DATE: 08/06/22  REFERRING DIAG: I63.9 (ICD-10-CM) - CVA (cerebral vascular accident) ([place])   THERAPY DIAG:  Hemiplegia and hemiparesis following cerebral infarction affecting right dominant side ([place])  Other lack of coordination  Attention and  concentration deficit  Unsteadiness on feet  SUBJECTIVE:   SUBJECTIVE STATEMENT: Pt unable to communicate d/t aphasia  Pt accompanied by: significant other    PERTINENT HISTORY: Presented to hospital 08/06/2022 with acute onset of right-sided weakness and aphasia. Cranial CT scan showed small age-indeterminate left PCA infarct of the left occipital lobe.  Patient did receive TNK as well as endovascular revascularization of occlusion of left M3 MCA middle division branch 08/06/2022 per interventional radiology. In hospital, cardiology service consulted for new A-fib RVR/CHF.    PMH:  hypertension hyperlipidemia sinus bradycardia/chronic CHF with mild regurgitation BPH history of tobacco use vascular dementia diagnosed 2022  PRECAUTIONS: Fall and Other: aphasia (particularly expressive), Dysphagia #1 nectar thick liquids   PAIN:  Are you having pain? No (Indicated by shaking head)   FALLS: Has patient fallen in last 6 months? Yes. Number of falls: 1, morning of CVA   OBJECTIVE:    HAND DOMINANCE: Right   TODAY'S TREATMENT:   10/14/2022 Pt practiced screwing/unscrewing nuts and bolts, folding towels, and tying shoes for bilateral integration and forced use of RUE. Pt only needed cues to use Lt hand as stabilizer and force Rt hand to screw/unscrew nuts from bolt.   Pt tossing larger ball, then smaller ball back and forth to therapist. Pt then tossed and  catched smaller ball with both hands w/ cues not to toss to therapist (difficulty switching tasks)   Pt placing large pegs in pegboard Rt hand w/ only 1-2 cues to use Rt hand. Pt flipping cards over w/ Rt hand w/ initial cues to use Rt hand.   Pt automatically looking more to Rt side today and using Rt hand w/ less cueing today. (However, noted O.T. was first therapy today)      PATIENT EDUCATION: Education details: attention to Rt side and how to incorporate pt using Rt hand for ADLS and scanning to Rt side Person educated:  Spouse Education method: Explanation Education comprehension: verbalized understanding      HOME EXERCISE PROGRAM: 09/14/22:  RUE and bilateral hand functional use HEP    GOALS: Potential Goals reviewed with patient? Yes  SHORT TERM GOALS: Target date: 10/06/22; extended due to scheduling to 10/13/2022  Pt/caregiver will be independent with HEP for RUE functional use/coordination.   Goal status: INITIAL  2.  Pt/caregiver will verbalize understanding of visual compensation strategies and visual HEP. Goal status: INITIAL  3.  Pt will perform simple tabletop visual scanning with at least 85% accuracy. Goal status: INITIAL  4.  Pt will improve R grip strength to at least 25lbs for incr functional use. Baseline:  17.1lbs Goal status: INITIAL  5.  Pt will be able to consistently perform bilateral UE tasks with no more than min cueing (such as zipping jacket, using pump of hand sanitizer, folding towels, etc). Goal status: INITIAL    LONG TERM GOALS: Target date: 12/06/22  Pt/caregiver will be independent with HEP for RUE functional use/strength.  Goal status: INITIAL  2.  Pt/caregiver will verbalize understanding of strategies to incr safety/ease/participation with ADLs/IADLs. Goal status: INITIAL  3.  Pt will perform simple environmental scanning with at least 85% accuracy for incr safety. Goal status: INITIAL  4.  Pt will improve RUE coordination/functional use and cognition as shown by improving score on box and blocks to 15 with RUE. Baseline: 5 blocks Goal status: INITIAL  5.  Pt will use RUE as dominant UE for functional tasks at least 90% of the time. Goal status: INITIAL  6.  Pt will be able to perform BADLs with no more than distant supervision. 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   PERFORMANCE DEFICITS in functional skills including ADLs, IADLs, coordination, dexterity, proprioception, strength, FMC, GMC, mobility, balance, decreased knowledge of precautions, decreased knowledge of use of DME, vision, and UE functional use, cognitive skills including attention, learn, memory, perception, problem solving, safety awareness, and understand, and psychosocial skills including environmental adaptation, habits, and routines and behaviors.   IMPAIRMENTS are limiting patient from ADLs, IADLs, and leisure.   COMORBIDITIES has co-morbidities such as vascular dementia  that affects occupational performance. Patient will benefit from skilled OT to address above impairments and improve overall function.  REHAB POTENTIAL: Good   PLAN: OT FREQUENCY: 2x/week  OT DURATION: 12 weeks +eval  PLANNED INTERVENTIONS: self care/ADL training, therapeutic exercise, therapeutic activity, neuromuscular re-education, passive range of motion, balance training, functional mobility training, fluidotherapy, moist heat, cryotherapy, patient/family education, cognitive remediation/compensation, visual/perceptual remediation/compensation, energy conservation, coping strategies training, and DME and/or AE instructions  RECOMMENDED OTHER SERVICES: current with Speech therapy, needs to schedule PT (referral in Epic)  CONSULTED AND AGREED WITH PLAN OF  CARE: Patient and family member/caregiver  PLAN FOR NEXT SESSION:  begin assessing STG's as able, work on [employer] (sweeping),  continue nuts/bolts, tossing ball. Consider placing on hold within 2-3 visits d/t severity of deficits impacting performance and ability to progress   Hans Eden, OTR/L 10/14/2022, 1:22 PM

## 2022-10-15 ENCOUNTER — Other Ambulatory Visit: Payer: Self-pay | Admitting: Adult Health

## 2022-10-16 ENCOUNTER — Ambulatory Visit (HOSPITAL_COMMUNITY)
Admission: RE | Admit: 2022-10-16 | Discharge: 2022-10-16 | Disposition: A | Discharge status: Home / Self Care | Payer: Medicare HMO | Source: Ambulatory Visit

## 2022-10-16 ENCOUNTER — Ambulatory Visit (HOSPITAL_COMMUNITY)
Admission: RE | Admit: 2022-10-16 | Discharge: 2022-10-16 | Disposition: A | Discharge status: Home / Self Care | Payer: Medicare HMO | Source: Ambulatory Visit | Attending: Physical Medicine and Rehabilitation | Admitting: Physical Medicine and Rehabilitation

## 2022-10-16 DIAGNOSIS — R1312 Dysphagia, oropharyngeal phase: Secondary | ICD-10-CM | POA: Insufficient documentation

## 2022-10-16 DIAGNOSIS — R482 Apraxia: Secondary | ICD-10-CM

## 2022-10-16 DIAGNOSIS — R41841 Cognitive communication deficit: Secondary | ICD-10-CM

## 2022-10-16 DIAGNOSIS — R4701 Aphasia: Secondary | ICD-10-CM

## 2022-10-16 DIAGNOSIS — R1310 Dysphagia, unspecified: Secondary | ICD-10-CM

## 2022-10-16 NOTE — Progress Notes (Signed)
Modified Barium Swallow Progress Note  Patient Details  Name: Dustin Blanchard MRN: [id number] Date of Birth: August 02, 1951  Today's Date: 10/16/2022  Modified Barium Swallow completed.  Full report located under Chart Review in the Imaging Section.  Brief recommendations include the following:  Clinical Impression  Pt continues to demonstrate moderate oropharyngeal dysphagia. Mastication is adequate, oral manipulation is slightly slow with occasional lingual rocking and pumping for transit. There is gradual spilling of liquids to pharynx and delayed intiation of swallow with liquid pooling in pyriforms prior to swallow. Pt still has decreased base of tongue retraction, limited epiglottic deflection and decreased strength and duration of laryngeal elevation, hyoid excursion and pharyngeal peristalsis. There are variable quantities of residue, depending on effort applied by pt; he does appear to follow commands for effortful swallows. WIth larger sips of thin liquids pt has aspiration during and after the swallow of pyriform residue. Pt senses mild penetrate and aspirate, but ejection is at times incomplete (PAS 7; 3). A chin tuck worsened aspiration. Single small sips were best tolerated. Would not advise nectar thick liquids as residue was much more significant with increased viscosity resulting in equal risk of aspiration. Esophageal sweep WNL though did note mild cricopharyngeal bar. Advise continuing swallowing therapy efforts and precautions. Suggest pt advance to mechanical soft/regular solids given adequate mastication and equal residue as puree.  Pt to f/u with primary therapist for recommendations.   Swallow Evaluation Recommendations       SLP Diet Recommendations: Dysphagia 3 (Mech soft) solids;Regular solids;Thin liquid   Liquid Administration via: Cup   Medication Administration: Whole meds with puree   Supervision: Patient able to self feed   Compensations: Slow rate;Small  sips/bites;Multiple dry swallows after each bite/sip;Effortful swallow   Postural Changes: Seated upright at 90 degrees   Oral Care Recommendations: Patient independent with oral care        Gabbi Whetstone, Katherene Ponto 10/16/2022,2:15 PM

## 2022-10-19 ENCOUNTER — Ambulatory Visit: Payer: Medicare HMO | Admitting: Occupational Therapy

## 2022-10-19 ENCOUNTER — Ambulatory Visit: Payer: Medicare HMO

## 2022-10-19 ENCOUNTER — Ambulatory Visit: Payer: Medicare HMO | Admitting: Speech Pathology

## 2022-10-19 DIAGNOSIS — R41841 Cognitive communication deficit: Secondary | ICD-10-CM

## 2022-10-19 DIAGNOSIS — R278 Other lack of coordination: Secondary | ICD-10-CM

## 2022-10-19 DIAGNOSIS — I69351 Hemiplegia and hemiparesis following cerebral infarction affecting right dominant side: Secondary | ICD-10-CM

## 2022-10-19 DIAGNOSIS — R4701 Aphasia: Secondary | ICD-10-CM | POA: Diagnosis not present

## 2022-10-19 DIAGNOSIS — R2689 Other abnormalities of gait and mobility: Secondary | ICD-10-CM

## 2022-10-19 DIAGNOSIS — R2681 Unsteadiness on feet: Secondary | ICD-10-CM

## 2022-10-19 DIAGNOSIS — R1310 Dysphagia, unspecified: Secondary | ICD-10-CM

## 2022-10-19 DIAGNOSIS — R482 Apraxia: Secondary | ICD-10-CM

## 2022-10-19 DIAGNOSIS — M6281 Muscle weakness (generalized): Secondary | ICD-10-CM

## 2022-10-19 DIAGNOSIS — R41840 Attention and concentration deficit: Secondary | ICD-10-CM

## 2022-10-19 NOTE — Patient Instructions (Signed)
We discussed trying thin liquids and chopped foods   Thin liquids:  Only put a little in a cup at a time Consider buying a PROVALE cup  Remind him small sips    Chopped foods:  Chopped smaller than bite size Moisten with gravy, dressing, syrup Check throughout meal that Dustin Blanchard is clearing his mouth Check at end of meal that mouth is clear    ** USE YOUR BEST JUDGEMENT **

## 2022-10-19 NOTE — Therapy (Signed)
OUTPATIENT OCCUPATIONAL THERAPY NEURO TREATMENT  Patient Name: Dustin Blanchard MRN: [id number] DOB:[birth year], 71 y.o., male Today's Date: 10/19/2022  PCP: [place] PROVIDER: Dr. Leeroy Cha   OT End of Session - 10/19/22 1233     Visit Number 9    Number of Visits 25    Date for OT Re-Evaluation 12/06/22    Authorization Type Aetna Medicare-- [place]- [place]; no auth; 100% coverage    Authorization - Visit Number 9    Authorization - Number of Visits 10    Progress Note Due on Visit 10    OT Start Time 1233    OT Stop Time 1315    OT Time Calculation (min) 42 min    Activity Tolerance Patient tolerated treatment well    Behavior During Therapy WFL for tasks assessed/performed               Past Medical History:  Diagnosis Date   Dementia ([place])    Past Surgical History:  Procedure Laterality Date   HERNIA REPAIR     HIP SURGERY     IR CT HEAD LTD  08/06/2022   IR CT HEAD LTD  08/06/2022   IR PERCUTANEOUS ART THROMBECTOMY/INFUSION INTRACRANIAL INC DIAG ANGIO  08/06/2022   IR US GUIDE VASC ACCESS RIGHT  08/06/2022   RADIOLOGY WITH ANESTHESIA N/A 08/06/2022   Procedure: IR WITH ANESTHESIA;  Surgeon: Luanne Bras, MD;  Location: [facility];  Service: Radiology;  Laterality: N/A;   Patient Active Problem List   Diagnosis Date Noted   Pneumonia of right lower lobe due to infectious organism    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   Sinus pause    Acute combined systolic and diastolic heart failure (HCC)    Atrial fibrillation with RVR (HCC)    Acute on chronic combined systolic and diastolic CHF (congestive heart failure) ([place])    Acute ischemic left MCA stroke ([place]) 08/06/2022    ONSET DATE: 08/06/22  REFERRING DIAG: I63.9 (ICD-10-CM) - CVA (cerebral vascular accident) ([place])   THERAPY DIAG:  Other lack of coordination  Hemiplegia and hemiparesis following cerebral infarction affecting right dominant side ([place])  Attention and  concentration deficit  SUBJECTIVE:   SUBJECTIVE STATEMENT: Pt unable to communicate d/t aphasia  Pt accompanied by: significant other    PERTINENT HISTORY: Presented to hospital 08/06/2022 with acute onset of right-sided weakness and aphasia. Cranial CT scan showed small age-indeterminate left PCA infarct of the left occipital lobe.  Patient did receive TNK as well as endovascular revascularization of occlusion of left M3 MCA middle division branch 08/06/2022 per interventional radiology. In hospital, cardiology service consulted for new A-fib RVR/CHF.    PMH:  hypertension hyperlipidemia sinus bradycardia/chronic CHF with mild regurgitation BPH history of tobacco use vascular dementia diagnosed 2022  PRECAUTIONS: Fall and Other: aphasia (particularly expressive), Dysphagia #1 nectar thick liquids   PAIN:  Are you having pain? No (Indicated by shaking head)   FALLS: Has patient fallen in last 6 months? Yes. Number of falls: 1, morning of CVA   OBJECTIVE:    HAND DOMINANCE: Right   TODAY'S TREATMENT:   10/19/2022 Assessed STG's - see goal section.  Simulated sweeping - pt can do physically however needed cues to angle broom correctly and move chairs for thoroughness  UBE x 5 mintues, level 1 for UE strength/endurance   Environmental scanning down hallway looking for yellow cards (face down) w/ approx 50% w/o cueing - spontaneously picking up. However, pt required  cues t/o task for visual reminders to look for yellow card and for full scanning. ? [place] on Lt side, however pt w/ improved scanning and finding targets to Rt side.   Tossing medium sized ball up in air and catching w/ both hands; then tossing to therapist (pt able to switch tasks quicker today w/ less cueing). Tossed small ball and catching with Rt hand   PATIENT EDUCATION: Education details: attention to Rt side and how to incorporate pt using Rt hand for ADLS and scanning to Rt side Person educated:  Spouse Education method: Explanation Education comprehension: verbalized understanding      HOME EXERCISE PROGRAM: 09/14/22:  RUE and bilateral hand functional use HEP    GOALS: Potential Goals reviewed with patient? Yes  SHORT TERM GOALS: Target date: 10/06/22; extended due to scheduling to 10/13/2022  Pt/caregiver will be independent with HEP for RUE functional use/coordination.   Goal status: MET  2.  Pt/caregiver will verbalize understanding of visual compensation strategies and visual HEP. Goal status: PARTIALLY MET - Unable to address visual HEP d/t aphasia and cognitive deficits  3.  Pt will perform simple tabletop visual scanning with at least 85% accuracy. Goal status: DEFERRED, improved scanning to Rt w/ auditory cues (calling name, hitting table)   4.  Pt will improve R grip strength to at least 25lbs for incr functional use. Baseline:  17.1lbs Goal status: MET (55 LBS)   5.  Pt will be able to consistently perform bilateral UE tasks with no more than min cueing (such as zipping jacket, using pump of hand sanitizer, folding towels, etc). Goal status: PARTIALLY MET (Doing towels, hand sanitizer, assist needed to start jacket)     LONG TERM GOALS: Target date: 12/06/22  Pt/caregiver will be independent with HEP for RUE functional use/strength.  Goal status: INITIAL  2.  Pt/caregiver will verbalize understanding of strategies to incr safety/ease/participation with ADLs/IADLs. Goal status: INITIAL  3.  Pt will perform simple environmental scanning with at least 85% accuracy for incr safety. Goal status: INITIAL  4.  Pt will improve RUE coordination/functional use and cognition as shown by improving score on box and blocks to 15 with RUE. Baseline: 5 blocks Goal status: INITIAL  5.  Pt will use RUE as dominant UE for functional tasks at least 90% of the time. Goal status: INITIAL  6.  Pt will be able to perform BADLs with no more than distant  supervision. 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   PERFORMANCE DEFICITS in functional skills including ADLs, IADLs, coordination, dexterity, proprioception, strength, FMC, GMC, mobility, balance, decreased knowledge of precautions, decreased knowledge of use of DME, vision, and UE functional use, cognitive skills including attention, learn, memory, perception, problem solving, safety awareness, and understand, and psychosocial skills including environmental adaptation, habits, and routines and behaviors.   IMPAIRMENTS are limiting patient from ADLs, IADLs, and leisure.   COMORBIDITIES has co-morbidities such as vascular dementia  that affects occupational performance. Patient will benefit from skilled OT to address above impairments and improve overall function.  REHAB POTENTIAL: Good   PLAN: OT FREQUENCY: 2x/week  OT DURATION: 12 weeks +eval  PLANNED INTERVENTIONS: self care/ADL training, therapeutic exercise, therapeutic activity, neuromuscular re-education, passive range of motion, balance training, functional mobility training, fluidotherapy, moist heat, cryotherapy, patient/family education, cognitive remediation/compensation, visual/perceptual remediation/compensation, energy conservation, coping  strategies training, and DME and/or AE instructions  RECOMMENDED OTHER SERVICES: current with Speech therapy, needs to schedule PT (referral in Epic)  CONSULTED AND AGREED WITH PLAN OF CARE: Patient and family member/caregiver  PLAN FOR NEXT SESSION:   continue nuts/bolts, tossing ball, check LTG #4 and #6 (anticipate other goals deferred or not met) d/c next session (pt/family agreeable)    Hans Eden, OTR/L 10/19/2022, 12:33 PM

## 2022-10-19 NOTE — Therapy (Signed)
OUTPATIENT SPEECH LANGUAGE PATHOLOGY TREATMENT NOTE   Patient Name: Dustin Blanchard MRN: [id number] DOB:06/21/1951, 71 y.o., male Today's Date: 10/19/2022  [facility] [place] PROVIDER: Barbie Banner, PA-C   END OF SESSION:   End of Session - 10/19/22 1107     Visit Number 12    Number of Visits 25    Date for SLP Re-Evaluation 11/30/22    Authorization Type Aetna Medicare    Progress Note Due on Visit 10    SLP Start Time 1145    SLP Stop Time  1230    SLP Time Calculation (min) 45 min    Activity Tolerance Patient tolerated treatment well                   Past Medical History:  Diagnosis Date   Dementia [facility])    Past Surgical History:  Procedure Laterality Date   HERNIA REPAIR     HIP SURGERY     IR CT HEAD LTD  08/06/2022   IR CT HEAD LTD  08/06/2022   IR PERCUTANEOUS ART THROMBECTOMY/INFUSION INTRACRANIAL INC DIAG ANGIO  08/06/2022   IR US GUIDE VASC ACCESS RIGHT  08/06/2022   RADIOLOGY WITH ANESTHESIA N/A 08/06/2022   Procedure: IR WITH ANESTHESIA;  Surgeon: Luanne Bras, MD;  Location: [facility];  Service: Radiology;  Laterality: N/A;   Patient Active Problem List   Diagnosis Date Noted   Pneumonia of right lower lobe due to infectious organism    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   Sinus pause    Acute combined systolic and diastolic heart failure (HCC)    Atrial fibrillation with RVR (HCC)    Acute on chronic combined systolic and diastolic CHF (congestive heart failure) ([place])    Acute ischemic left MCA stroke ([place]) 08/06/2022    ONSET DATE: 08/06/2022    REFERRING DIAG: I63.9 (ICD-10-CM) - CVA (cerebral vascular accident) ([place])   THERAPY DIAG:  Aphasia  Cognitive communication deficit  Apraxia  Dysphagia, unspecified type  Rationale for Evaluation and Treatment Rehabilitation  SUBJECTIVE: "she said she'd leave the recommendations to you" re: MBSS  PAIN:  Are you having pain?  No  OBJECTIVE:   TODAY'S TREATMENT:  10-19-22: SLP reviewed results from MBSS and provided recommendations for diet modification. Recommend thin liquids with minced and moist or chopped solids. Provided education on modifications spouse could make to enhance swallow efficacy at meal time. Provided education on oral hygiene recs, pt with scheduled dentist appointment. SLP led pt through modified melodic intonation therapy. After listening to familiar song, pt able to complete lyric with 12/12 trials with x1 occurrence of self-correction.   10-14-22: Continued use of modified melodic intonation therapy to increase verbal expression abilities. Pt with recall of previously target phrase, able to demonstrate x2 with min-A. Target phrase" how are you today," with pt able to demonstrate x5 with mod-I following systematic instruction to include choral production. Excellent awareness of errors with pt re-attempting without SLP cues.Target receptive ID of everyday objects, with pt demonstrating accurate ID of object in 4/6 trials with usual repetition and occasional needs for written support. X1 phrase completion to name 5/6 objects. Final object requires direct model and written support. Semantic cues A pt in naming of object x1.   10-12-22: SLP provides education on aspiration precautions for thin liquid trials at home. Encourage dental care, to include visit to dentist, be active, small sips, PO only when upright. Spouse verbalizes understanding. Target verbal  expression through training of modified melodic intonation therapy. SLP provides max-A verbal, tactile, and visual cues, for tapping to metronome and humming basic melody. Pt achieved humming and consonant /s/ in tune to melody with max-A faded to mod-I in 8/10 trials for both. Target phrase "some water please" with SLP able to fade choral production for mod-I production from pt in 4/6 trials. Able to vocalize full name in 4/7 trials with melody with max,  faded to min-A.   10-08-22: Target naming family members. Pt able to imitate x10 with direct model, recall and name 7/10 with delayed model following ONGOING training. Use of melodic intonation training to aid in clear productions of family members names. Pt with self-awareness of clarity of production and attempting self-correction x2. Generated x3 phrases which pt able to imitate with clarity: "you a mess, [place] D[place], looking cute today" to use as scripts with grandchildren.   10-06-22: Target naming of function home items. Given picture and FO2 written options, pt names item correctly in 3/3 trials. Continues to benefit from options being presented vertically to compensate for R visual cut. Pt achieved verbal approximation of named items in 3/3 trials with usual max-A. Following, able to verbally name 1/3 item with mod-I, additional x1 with delayed model. Perseveration impacting subsequent attempts to name with pt shaking head indicating awareness of perseveration. Reintroduced TD SNAP program with pt able to accurately select dictated items in 1/4 trials from [facility] with max verbal A for scanning. R neglect appears to be primary barrier at this time to augmentative communication device. PO trials of mixed consistencies without overt s/sx of aspiration and demonstrating good oral clearance. Trial thin liquids via cup and straw without overt s/sx of aspiration. Clear vocal quality remains throughout bolus administration in sustained ah production. Probed ability to complete exercises, if indicated with pt unable to follow cues to swallow volitionally or complete chin tuck against resistance. As such, dyspahgia exercises appear to be contraindicated at this time. However, based on improvement during clinical administrations of thin liquids, and time since prior instrumental evaluation, recommend repeat instrumental swallow study to objectively assess swallow function. Ordered this date with education provided to  pt and spouse re: purpose and potential outcomes. If possible, request that esophageal screening be completed by [facility] administering clinician.    10-02-22: Increased frustration this date from previous sessio. Target response to personal ID questions. Pt able to verbalize name with and without verbal cues. Approximation of birthday with written cues and direct model. Verbalizes wife's name with visual A and direct model. Given picture representation of everyday objects, pt able to select correct actual object for FO4 5/6 trials. Given FO3 picture representations, pt able to ID correct object presented in 9/11 trials. Inattention appears to be barrier vs object discrimination abilities. Use of [place] SNAP program to assess for AAC candidacy. Pt able to select appropriate verbally dictated icon from [place] with 90% accuracy. Incorrect selection d/t decreased technology proficiency at time but pt with verbal "no" and continued scanning for correct icon.   10-01-22: Trial thin liquid bolus trials today, from cup with pt self-feeding. Intermittent throat cleats but overall appears to have handled bolus trials well. Was unable to complete sequential sips. Continues to have barriers to exercises or implementation of swallow compensations 2/2 receptive language impairment and cognitive impairment. SLP recommends consideration of instrumental swallow study to objectively assess pt's swallow function.  Target naming and auditory comprehension of familiar objects. Pt able to repeat name of all objects  with max-A. ID correct object from FO2 6/6 trials; ID correct object from [facility] 6/6 trials. ID correct object from [place] 3/6 trials. Following, pt able to name 12/18 objects given phonemic cues.     PATIENT EDUCATION: Education details: see above Person educated: Patient and Spouse Education method: Explanation, Demonstration, and Handouts Education comprehension: verbalized understanding, returned demonstration, and needs  further education   GOALS: Goals reviewed with patient? Yes   SHORT TERM GOALS: Target date: 10/12/2022   Pt will average 80% accuracy in practical naming tasks with usual mod-A over 2 sessions.  Baseline: 10-06-22, 10-08-22 Goal status: MET   2.  Spouse will teach back supportive communication strategies to aid in auditory comprehension at home with supervision A.   Baseline: 10-12-22 Goal status: MET   3.  Pt will comprehend  written choice f:3 at word level with known context 80% to make accurate selections of preferences, answer questions at home with occasional min A Baseline: 10-08-22 Goal status: MET   4.  Pt will be intelligible in 2 word phrase production in 15/20 trials with direct model and usual mod-A  Baseline: 10-12-22 Goal status: PARTIALLY MET   5.  Pt will complete augmentative communication/SGD canidacy assessment to determine appropriate communication device to support communication of wants/needs/thoughts Baseline: 10-12-22 Goal status: NOT MET, ONGOING     LONG TERM GOALS: Target date: 11/30/2022    Pt will use AAC/multimodal communicate to communicate choices, wants at home with family and caregiver over 1 week Baseline:  Goal status: ONGOING   2.  Pt will name 5 items in personally relevant categories with occasional mod A over 2 sessions Baseline:  Goal status: ONGOING   3.  Pt will employ dysarthria strategies resulting in 50% intelligibility in self-generated words and phrases with usual mod-A Baseline:  Goal status: ONGOING   4.  Pt will participate in clinical swallow evaluation to assess for candidacy for upgraded diet textures and liquid consistencies PRN Baseline:  Goal status: ONGOING     ASSESSMENT:   CLINICAL IMPRESSION: Patient is a 71 y.o. M who was seen today for cognitive linguistic and dysphagia evaluation following CVA. Pt presenting with severe non-fluent aphasia, moderate dysarthria, oropharyngeal dysphagia, and cognitive  deficits in areas of attention, memory, executive functioning.  Significant expressive > receptive language deficits impacting overall communication abilities. Pt spouse reporting usual frustration at home, though are employing yes/no questioning to determine wants/needs. Ongoing education and training to rehabilitate expressive and receptive language. Improving ability to repeat direct models and then subsequently name targeted items with short delay. SLP has engaged pt in variety of tasks to assess for speech generating device candidacy. At this time, R visual inattention primary barrier. Pt with improving object discrimination and ability to ID objects both real and pictured. Appears to be tolerating thin liquids during clincal swallow evaluation. Continues to not be stimulible for dysphagia exercises or strategies d/t impairment evidenced with following directions. Repeat instrumental swallow evaluation has been ordered to determine if diet upgrade is appropriate at this time. Severity of cognitive impairments continues to be difficult to assess 2/2 communication impairment. Pt benefiting from usual repetition, use of simple and direct language, visual and verbal cueing for participation in assessment. Ongoing education for spouse re: supportive communication strategies. SLP recommends continued skilled ST intervention course to facilitate improved communication efficacy, reduce caregiver burden, and improve QoL.    OBJECTIVE IMPAIRMENTS include attention, memory, executive functioning, aphasia, dysarthria, and dysphagia. These impairments are limiting patient from ADLs/IADLs,  effectively communicating at home and in community, and safety when swallowing. Factors affecting potential to achieve goals and functional outcome are ability to learn/carryover information, co-morbidities, previous level of function, and financial resources. Patient will benefit from skilled SLP services to address above impairments  and improve overall function.   REHAB POTENTIAL: Fair co-morbidities   PLAN: SLP FREQUENCY: 2x/week   SLP DURATION: 12 weeks   PLANNED INTERVENTIONS: Aspiration precaution training, Pharyngeal strengthening exercises, Diet toleration management , Language facilitation, Trials of upgraded texture/liquids, Cueing hierachy, Cognitive reorganization, Internal/external aids, Oral motor exercises, Functional tasks, Multimodal communication approach, SLP instruction and feedback, Compensatory strategies, Patient/family education, and Re-evaluation   Su Monks, CCC-SLP 10/19/2022, 11:08 AM

## 2022-10-19 NOTE — Therapy (Signed)
OUTPATIENT PHYSICAL THERAPY NEURO TREATMENT/ 10TH VISIT PROGRESS NOTE   Patient Name: Dustin Blanchard MRN: [id number] DOB:[birth year], 71 y.o., male Today's Date: 10/19/2022   PCP: Dr. Loletha Grayer at [place] PROVIDER: Barbie Banner, PA-C    Physical Therapy Progress Note   Dates of Reporting Period:09/14/22- 10/19/22  See Note below for Objective Data and Assessment of Progress/Goals.  Thank you for the referral of this patient. Debbora Dus, PT, DPT, CBIS    PT End of Session - 10/19/22 1103     Visit Number 10    Number of Visits 17    Date for PT Re-Evaluation 11/23/22    Authorization Type [employer]    Progress Note Due on Visit 20    PT Start Time 1102    PT Stop Time 1143    PT Time Calculation (min) 41 min    Equipment Utilized During Treatment Gait belt    Activity Tolerance Patient tolerated treatment well    Behavior During Therapy WFL for tasks assessed/performed                  Past Medical History:  Diagnosis Date   Dementia [facility])    Past Surgical History:  Procedure Laterality Date   HERNIA REPAIR     HIP SURGERY     IR CT HEAD LTD  08/06/2022   IR CT HEAD LTD  08/06/2022   IR PERCUTANEOUS ART THROMBECTOMY/INFUSION INTRACRANIAL INC DIAG ANGIO  08/06/2022   IR US GUIDE VASC ACCESS RIGHT  08/06/2022   RADIOLOGY WITH ANESTHESIA N/A 08/06/2022   Procedure: IR WITH ANESTHESIA;  Surgeon: Luanne Bras, MD;  Location: [place];  Service: Radiology;  Laterality: N/A;   Patient Active Problem List   Diagnosis Date Noted   Pneumonia of right lower lobe due to infectious organism    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   Sinus pause    Acute combined systolic and diastolic heart failure (HCC)    Atrial fibrillation with RVR (HCC)    Acute on chronic combined systolic and diastolic CHF (congestive heart failure) ([place])    Acute ischemic left MCA stroke ([place]) 08/06/2022    ONSET DATE: 08/26/2022    REFERRING DIAG: I63.9 (ICD-10-CM) - CVA (cerebral vascular accident) ([place])   THERAPY DIAG:  Other lack of coordination  Unsteadiness on feet  Muscle weakness (generalized)  Other abnormalities of gait and mobility  Rationale for Evaluation and Treatment Rehabilitation  SUBJECTIVE:  SUBJECTIVE STATEMENT: Patient reports doing well. No falls/ near falls. Did have MBS on Friday- hasn't gotten results back yet.   Pt accompanied by: significant other wife [name]  PERTINENT HISTORY: hypertension, hyperlipidemia, sinus bradycardia/chronic CHF with mild regurgitation, BPH, history of tobacco use, vascular dementia diagnosed 2022   PAIN:  Are you having pain? No   PRECAUTIONS: Fall and Other: aphasia, dysphagia 1 (nectar-thick)  WEIGHT BEARING RESTRICTIONS No  FALLS: Has patient fallen in last 6 months? Yes. Number of falls one fall when he had the stroke, no other falls  PLOF: Needs assistance with gait, Needs assistance with transfers, and uses RW in the home with Supervision, uses transport chair for community mobility  PATIENT GOALS "to get him back to moving and walking better"  OBJECTIVE:   TODAY'S TREATMENT:  -MaxA to complete edges of puzzle with PT limiting option field to 2-3 pieces.   -demonstrates minimal carryover from previous therapy session working on same task  -scifit hills x8 mins level 3 B LE only   -difficulty with smooth reciprocal coordination   PATIENT EDUCATION: Education details: continue HEP Person educated: Patient and Spouse Education method: Customer service manager Education comprehension: verbalized understanding, returned demonstration, verbal cues required, and needs further education   HOME EXERCISE PROGRAM: Access Code: [email or web address] URL:  [email or web address] Date: 09/17/2022 Prepared by: Excell Seltzer  Exercises - Side Stepping with Counter Support  - 1 x daily - 7 x weekly - 1 sets - 5 reps - Tandem Walking with Counter Support  - 1 x daily - 7 x weekly - 1 sets - 5 reps - Alternating Step Taps with Counter Support  - 1 x daily - 7 x weekly - 2 sets - 10 reps   GOALS: Goals reviewed with patient? Yes  SHORT TERM GOALS: Target date: 10/12/2022  Pt will be independent with initial HEP for improved strength, balance, transfers and gait.  Baseline: provided Goal status: MET  2.  Pt will improve gait velocity to at least 2.0 ft/sec for improved gait efficiency and performance at Supervision level   Baseline: 1.82 ft/sec (9/25); 2.41f/s or .882m Goal status: MET  3.  Pt will improve normal TUG to less than or equal to 30 seconds for improved functional mobility and decreased fall risk.  Baseline: 40.43 sec (9/25); 12.97s Goal status: MET  4.  Pt will improve Berg score to 30/56 for decreased fall risk  Baseline: 26/56 (9/25); 48/56 Goal status: MET  5.  Pt will improve 5 x STS to less than or equal to 30 seconds to demonstrate improved functional strength and transfer efficiency.   Baseline: 34.62 sec (9/25)17.97s with B UE Goal status: MET   LONG TERM GOALS: Target date: 11/09/2022  Pt will be independent with final HEP for improved strength, balance, transfers and gait.  Baseline:  Goal status: INITIAL  2.  Pt will improve gait velocity to at least 3.26f74f  for improved gait efficiency and performance at Supervision level   Baseline: 1.82 ft/sec (9/25) Goal status: REVISED  3.  Pt will improve normal TUG to less than or equal to 9 seconds for improved functional mobility and decreased fall risk.  Baseline: 40.43 sec (9/25) Goal status: REVISED  4.  Pt will improve Berg score to 52/56 for decreased fall risk  Baseline: 26/56 (925) Goal status: REVISED  5.  Pt will improve 5  x STS to less than or equal to 12 seconds to demonstrate improved functional strength and transfer efficiency.  Baseline: 34.62 sec (9/25) Goal status: REVISED   ASSESSMENT:  CLINICAL IMPRESSION: Patient seen for skilled PT session with emphasis on R visual scanning, problem solving and reciprocal coordination. Patient demonstrating minimal carryover of tasks from previous therapy session as it pertains to simple problem solving and R visual scanning. He does seem to do better with a reduced field of options, but even given 1 puzzle piece he demonstrates difficulty. Continue POC.   OBJECTIVE IMPAIRMENTS Abnormal gait, decreased balance, decreased cognition, decreased knowledge of condition, decreased knowledge of use of DME, decreased mobility, decreased strength, decreased safety awareness, and impaired vision/preception.   ACTIVITY LIMITATIONS carrying, lifting, bending, standing, squatting, stairs, and transfers  PARTICIPATION LIMITATIONS: meal prep, cleaning, laundry, medication management, interpersonal relationship, driving, shopping, community activity, and yard work  PERSONAL FACTORS 1-2 comorbidities:    PMH that includes:  hypertension, hyperlipidemia, sinus bradycardia/chronic CHF with mild regurgitation, BPH, history of tobacco use, vascular dementia diagnosed 2022 are also affecting patient's functional outcome.   REHAB POTENTIAL: Fair history of dementia and severity of R neglect/inattention  CLINICAL DECISION MAKING: Stable/uncomplicated  EVALUATION COMPLEXITY: Moderate  PLAN: PT FREQUENCY: 2x/week  PT DURATION: 8 weeks  PLANNED INTERVENTIONS: Therapeutic exercises, Therapeutic activity, Neuromuscular re-education, Balance training, Gait training, Patient/Family education, Self Care, Joint mobilization, Stair training, Vestibular training, Canalith repositioning, Visual/preceptual remediation/compensation, Orthotic/Fit training, DME instructions, Aquatic Therapy, Dry  Needling, Cognitive remediation, Electrical stimulation, Wheelchair mobility training, Cryotherapy, Moist heat, Taping, Manual therapy, and Re-evaluation  PLAN FOR NEXT SESSION: Seated and standing R inattention tasks. add to HEP for balance and R attention, work on [employer], visual scanning to the R and attention to R side, safety with transfers and mobility with LRAD, enjoys basketball so incorporating that or other sports into treatment session, sweeping? Golf? Carrying firewood through obstacle course (per wife he would carry it down an incline)   Debbora Dus, PT, DPT Debbora Dus, PT, DPT, CBIS  10/19/2022, 11:47 AM

## 2022-10-21 ENCOUNTER — Ambulatory Visit: Payer: Medicare HMO

## 2022-10-21 ENCOUNTER — Ambulatory Visit: Payer: Medicare HMO | Attending: Student | Admitting: Occupational Therapy

## 2022-10-21 ENCOUNTER — Ambulatory Visit: Payer: Medicare HMO | Admitting: Speech Pathology

## 2022-10-21 DIAGNOSIS — R278 Other lack of coordination: Secondary | ICD-10-CM

## 2022-10-21 DIAGNOSIS — R482 Apraxia: Secondary | ICD-10-CM

## 2022-10-21 DIAGNOSIS — I69318 Other symptoms and signs involving cognitive functions following cerebral infarction: Secondary | ICD-10-CM | POA: Diagnosis present

## 2022-10-21 DIAGNOSIS — R41841 Cognitive communication deficit: Secondary | ICD-10-CM

## 2022-10-21 DIAGNOSIS — R2681 Unsteadiness on feet: Secondary | ICD-10-CM

## 2022-10-21 DIAGNOSIS — R41840 Attention and concentration deficit: Secondary | ICD-10-CM | POA: Diagnosis present

## 2022-10-21 DIAGNOSIS — R2689 Other abnormalities of gait and mobility: Secondary | ICD-10-CM

## 2022-10-21 DIAGNOSIS — R4701 Aphasia: Secondary | ICD-10-CM | POA: Insufficient documentation

## 2022-10-21 DIAGNOSIS — R1310 Dysphagia, unspecified: Secondary | ICD-10-CM

## 2022-10-21 DIAGNOSIS — I69351 Hemiplegia and hemiparesis following cerebral infarction affecting right dominant side: Secondary | ICD-10-CM

## 2022-10-21 DIAGNOSIS — M6281 Muscle weakness (generalized): Secondary | ICD-10-CM | POA: Diagnosis present

## 2022-10-21 NOTE — Therapy (Signed)
OUTPATIENT SPEECH LANGUAGE PATHOLOGY TREATMENT NOTE   Patient Name: Dustin Blanchard MRN: [id number] DOB:30-May-1951, 71 y.o., male Today's Date: 10/21/2022  PCP: [facility] [place] PROVIDER: Vaughan Basta, Edman Circle, PA-C   END OF SESSION:   End of Session - 10/21/22 1319     Visit Number 13    Number of Visits 25    Date for SLP Re-Evaluation 11/30/22    Authorization Type [employer]na Medicare    Progress Note Due on Visit 10    SLP Start Time 1320    SLP Stop Time  1400    SLP Time Calculation (min) 40 min    Activity Tolerance Patient tolerated treatment well                   Past Medical History:  Diagnosis Date   Dementia [facility])    Past Surgical History:  Procedure Laterality Date   HERNIA REPAIR     HIP SURGERY     IR CT HEAD LTD  08/06/2022   IR CT HEAD LTD  08/06/2022   IR PERCUTANEOUS ART THROMBECTOMY/INFUSION INTRACRANIAL INC DIAG ANGIO  08/06/2022   IR US GUIDE VASC ACCESS RIGHT  08/06/2022   RADIOLOGY WITH ANESTHESIA N/A 08/06/2022   Procedure: IR WITH ANESTHESIA;  Surgeon: Luanne Bras, MD;  Location: [place];  Service: Radiology;  Laterality: N/A;   Patient Active Problem List   Diagnosis Date Noted   Pneumonia of right lower lobe due to infectious organism    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 alright"   PAIN:  Are you having pain? No  OBJECTIVE:   TODAY'S TREATMENT:  10-21-22:  SLP constructed simple low tech AAC, using vertical options to aid in expressive communication of basic wants/needs. SLP modeled use of device and provided direct instruction to spouse on how to implement at home when pt is having difficulty expressing wants/needs. Following, pt able to select dictated option in 2/5 trials with max-A. Using melody to produce functional phrase "I want..." pt able to imitate with max-A x4 and produce with mod-I x1 "I want soda." Education on providing simple options and Y/N to determine wants/needs. Use of confirmation to confirm pt made preferred selection. SLP modeled for spouse. Pt verbalizes understanding through teach back. Spouse reports pt with little interest in chopped foods, appears to be tolerating thin liquids though still providing some thickened. Education on manipulation of solids requires practice with solids.   10-19-22: SLP reviewed results from MBSS and provided recommendations for diet modification. Recommend thin liquids with minced and moist or chopped solids. Provided education on modifications spouse could make to enhance swallow efficacy at meal time. Provided education on oral hygiene recs, pt with scheduled dentist appointment. SLP led pt through modified melodic intonation therapy. After listening to familiar song, pt able to complete lyric with 12/12 trials with x1 occurrence of self-correction.   10-14-22: Continued use of modified melodic intonation therapy to increase verbal  expression abilities. Pt with recall of previously target phrase, able to demonstrate x2 with min-A. Target phrase" how are you today," with pt able to demonstrate x5 with mod-I following systematic instruction to include choral production. Excellent awareness of errors with pt re-attempting without SLP cues.Target receptive ID of everyday objects, with pt demonstrating accurate ID of object in 4/6 trials with usual repetition and occasional needs for written support. X1 phrase  completion to name 5/6 objects. Final object requires direct model and written support. Semantic cues A pt in naming of object x1.   10-12-22: SLP provides education on aspiration precautions for thin liquid trials at home. Encourage dental care, to include visit to dentist, be active, small sips, PO only when upright. Spouse verbalizes understanding. Target verbal expression through training of modified melodic intonation therapy. SLP provides max-A verbal, tactile, and visual cues, for tapping to metronome and humming basic melody. Pt achieved humming and consonant /s/ in tune to melody with max-A faded to mod-I in 8/10 trials for both. Target phrase "some water please" with SLP able to fade choral production for mod-I production from pt in 4/6 trials. Able to vocalize full name in 4/7 trials with melody with max, faded to min-A.   10-08-22: Target naming family members. Pt able to imitate x10 with direct model, recall and name 7/10 with delayed model following ONGOING training. Use of melodic intonation training to aid in clear productions of family members names. Pt with self-awareness of clarity of production and attempting self-correction x2. Generated x3 phrases which pt able to imitate with clarity: "you a mess, Thornton Dales, looking cute today" to use as scripts with grandchildren.   10-06-22: Target naming of function home items. Given picture and FO2 written options, pt names item correctly in 3/3 trials. Continues to benefit from options being presented vertically to compensate for R visual cut. Pt achieved verbal approximation of named items in 3/3 trials with usual max-A. Following, able to verbally name 1/3 item with mod-I, additional x1 with delayed model. Perseveration impacting subsequent attempts to name with pt shaking head indicating awareness of perseveration. Reintroduced TD SNAP program with pt able to accurately select dictated items in 1/4 trials from [facility] with max verbal A for scanning.  R neglect appears to be primary barrier at this time to augmentative communication device. PO trials of mixed consistencies without overt s/sx of aspiration and demonstrating good oral clearance. Trial thin liquids via cup and straw without overt s/sx of aspiration. Clear vocal quality remains throughout bolus administration in sustained ah production. Probed ability to complete exercises, if indicated with pt unable to follow cues to swallow volitionally or complete chin tuck against resistance. As such, dyspahgia exercises appear to be contraindicated at this time. However, based on improvement during clinical administrations of thin liquids, and time since prior instrumental evaluation, recommend repeat instrumental swallow study to objectively assess swallow function. Ordered this date with education provided to pt and spouse re: purpose and potential outcomes. If possible, request that esophageal screening be completed by [facility] administering clinician.    10-02-22: Increased frustration this date from previous sessio. Target response to personal ID questions. Pt able to verbalize name with and without verbal cues. Approximation of birthday with written cues and direct model. Verbalizes wife's name with visual A and direct model. Given picture representation of everyday objects, pt able to select correct actual object for FO4 5/6 trials. Given FO3 picture representations, pt able to ID correct object presented in 9/11 trials. Inattention  appears to be barrier vs object discrimination abilities. Use of [employer] SNAP program to assess for AAC candidacy. Pt able to select appropriate verbally dictated icon from [place] with 90% accuracy. Incorrect selection d/t decreased technology proficiency at time but pt with verbal "no" and continued scanning for correct icon.   10-01-22: Trial thin liquid bolus trials today, from cup with pt self-feeding. Intermittent throat cleats but overall appears to have handled bolus trials  well. Was unable to complete sequential sips. Continues to have barriers to exercises or implementation of swallow compensations 2/2 receptive language impairment and cognitive impairment. SLP recommends consideration of instrumental swallow study to objectively assess pt's swallow function.  Target naming and auditory comprehension of familiar objects. Pt able to repeat name of all objects with max-A. ID correct object from FO2 6/6 trials; ID correct object from [facility] 6/6 trials. ID correct object from [place] 3/6 trials. Following, pt able to name 12/18 objects given phonemic cues.     PATIENT EDUCATION: Education details: see above Person educated: Patient and Spouse Education method: Explanation, Demonstration, and Handouts Education comprehension: verbalized understanding, returned demonstration, and needs further education   GOALS: Goals reviewed with patient? Yes   SHORT TERM GOALS: Target date: 10/12/2022   Pt will average 80% accuracy in practical naming tasks with usual mod-A over 2 sessions.  Baseline: 10-06-22, 10-08-22 Goal status: MET   2.  Spouse will teach back supportive communication strategies to aid in auditory comprehension at home with supervision A.   Baseline: 10-12-22 Goal status: MET   3.  Pt will comprehend  written choice f:3 at word level with known context 80% to make accurate selections of preferences, answer questions at home with occasional min A Baseline: 10-08-22 Goal status: MET   4.  Pt will be intelligible in 2 word phrase production in 15/20 trials with direct model and usual mod-A  Baseline: 10-12-22 Goal status: PARTIALLY MET   5.  Pt will complete augmentative communication/SGD canidacy assessment to determine appropriate communication device to support communication of wants/needs/thoughts Baseline: 10-12-22 Goal status: NOT MET, ONGOING     LONG TERM GOALS: Target date: 11/30/2022    Pt will use AAC/multimodal communicate to communicate  choices, wants at home with family and caregiver over 1 week Baseline:  Goal status: ONGOING   2.  Pt will name 5 items in personally relevant categories with occasional mod A over 2 sessions Baseline:  Goal status: ONGOING   3.  Pt will employ dysarthria strategies resulting in 50% intelligibility in self-generated words and phrases with usual mod-A Baseline:  Goal status: ONGOING   4.  Pt will participate in clinical swallow evaluation to assess for candidacy for upgraded diet textures and liquid consistencies PRN Baseline:  Goal status: ONGOING     ASSESSMENT:   CLINICAL IMPRESSION: Patient is a 71 y.o. M who was seen today for cognitive linguistic and dysphagia evaluation following CVA. Pt presenting with severe non-fluent aphasia, moderate dysarthria, oropharyngeal dysphagia, and cognitive deficits in areas of attention, memory, executive functioning.  Significant expressive > receptive language deficits impacting overall communication abilities. Pt spouse reporting usual frustration at home, though are employing yes/no questioning to determine wants/needs. Ongoing education and training to rehabilitate expressive and receptive language. Improving ability to repeat direct models and then subsequently name targeted items with short delay. SLP has engaged pt in variety of tasks to assess for speech generating device candidacy. At this time, R visual inattention primary barrier. Pt with improving object discrimination and  ability to ID objects both real and pictured. Appears to be tolerating thin liquids during clincal swallow evaluation. Continues to not be stimulible for dysphagia exercises or strategies d/t impairment evidenced with following directions. Repeat instrumental swallow evaluation has been ordered to determine if diet upgrade is appropriate at this time. Severity of cognitive impairments continues to be difficult to assess 2/2 communication impairment. Pt benefiting from usual  repetition, use of simple and direct language, visual and verbal cueing for participation in assessment. Ongoing education for spouse re: supportive communication strategies. SLP recommends continued skilled ST intervention course to facilitate improved communication efficacy, reduce caregiver burden, and improve QoL.    OBJECTIVE IMPAIRMENTS include attention, memory, executive functioning, aphasia, dysarthria, and dysphagia. These impairments are limiting patient from ADLs/IADLs, effectively communicating at home and in community, and safety when swallowing. Factors affecting potential to achieve goals and functional outcome are ability to learn/carryover information, co-morbidities, previous level of function, and financial resources. Patient will benefit from skilled SLP services to address above impairments and improve overall function.   REHAB POTENTIAL: Fair co-morbidities   PLAN: SLP FREQUENCY: 2x/week   SLP DURATION: 12 weeks   PLANNED INTERVENTIONS: Aspiration precaution training, Pharyngeal strengthening exercises, Diet toleration management , Language facilitation, Trials of upgraded texture/liquids, Cueing hierachy, Cognitive reorganization, Internal/external aids, Oral motor exercises, Functional tasks, Multimodal communication approach, SLP instruction and feedback, Compensatory strategies, Patient/family education, and Re-evaluation   Su Monks, CCC-SLP 10/21/2022, 1:23 PM

## 2022-10-21 NOTE — Therapy (Addendum)
OUTPATIENT OCCUPATIONAL THERAPY NEURO TREATMENT  Patient Name: Dustin Blanchard MRN: [id number] DOB:02-19-1951, 71 y.o., male Today's Date: 10/21/2022  PCP: [facility] REFERRING PROVIDER: Dr. Leeroy Cha  OCCUPATIONAL THERAPY DISCHARGE SUMMARY    Current functional level related to goals / functional outcomes: For the reporting period of :09/07/22-10/21/22. Pt made some progress towards goals, however he did not fully achieve goals due to visual perceptual, communication and cognitive deficits.  Pt/ wife agree with plans for d/c. See progress towards goals below.   Remaining deficits: Cognitive deficits, visual perceptual deficits, apraxia, language deficits   Education / Equipment: Pt and family were educated regarding:HEP and R inattention/ visual compensations. Pt' wife verbalized understanding.  Patient agrees to discharge. Patient goals were partially met. Patient is being discharged due to the patient's request./ pt's wife's request.      OT End of Session - 10/21/22 1450     Visit Number 10    Number of Visits 25    Date for OT Re-Evaluation 12/06/22    Authorization Type Aetna Medicare-- [place]- [place]; no auth; 100% coverage    Authorization - Visit Number 10    Authorization - Number of Visits 10    Progress Note Due on Visit 10    OT Start Time 1405    OT Stop Time 1435    OT Time Calculation (min) 30 min    Activity Tolerance Patient tolerated treatment well    Behavior During Therapy WFL for tasks assessed/performed                Past Medical History:  Diagnosis Date   Dementia ([place])    Past Surgical History:  Procedure Laterality Date   HERNIA REPAIR     HIP SURGERY     IR CT HEAD LTD  08/06/2022   IR CT HEAD LTD  08/06/2022   IR PERCUTANEOUS ART THROMBECTOMY/INFUSION INTRACRANIAL INC DIAG ANGIO  08/06/2022   IR US GUIDE VASC ACCESS RIGHT  08/06/2022   RADIOLOGY WITH ANESTHESIA N/A 08/06/2022   Procedure: IR WITH ANESTHESIA;  Surgeon:  Luanne Bras, MD;  Location: [place];  Service: Radiology;  Laterality: N/A;   Patient Active Problem List   Diagnosis Date Noted   Pneumonia of right lower lobe due to infectious organism    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   Sinus pause    Acute combined systolic and diastolic heart failure (HCC)    Atrial fibrillation with RVR (HCC)    Acute on chronic combined systolic and diastolic CHF (congestive heart failure) ([place])    Acute ischemic left MCA stroke ([place]) 08/06/2022    ONSET DATE: 08/06/22  REFERRING DIAG: I63.9 (ICD-10-CM) - CVA (cerebral vascular accident) ([place])   THERAPY DIAG:  Other lack of coordination  Muscle weakness (generalized)  Other abnormalities of gait and mobility  Hemiplegia and hemiparesis following cerebral infarction affecting right dominant side (HCC)  Attention and concentration deficit  Other symptoms and signs involving cognitive functions following cerebral infarction  SUBJECTIVE:   SUBJECTIVE STATEMENT: Pt denies pain  Pt accompanied by: significant other    PERTINENT HISTORY: Presented to hospital 08/06/2022 with acute onset of right-sided weakness and aphasia. Cranial CT scan showed small age-indeterminate left PCA infarct of the left occipital lobe.  Patient did receive TNK as well as endovascular revascularization of occlusion of left M3 MCA middle division branch 08/06/2022 per interventional radiology. In hospital, cardiology service consulted for new A-fib RVR/CHF.    PMH:  hypertension hyperlipidemia sinus  bradycardia/chronic CHF with mild regurgitation BPH history of tobacco use vascular dementia diagnosed 2022  PRECAUTIONS: Fall and Other: aphasia (particularly expressive), Dysphagia #1 nectar thick liquids   PAIN:  Are you having pain? No (Indicated by shaking head)   FALLS: Has patient fallen in last 6 months? Yes. Number of falls: 1, morning of CVA   OBJECTIVE:    HAND DOMINANCE: Right   TODAY'S  TREATMENT:   10/21/2022 Checked progress towards goals and discussed plans for d/c. Reviewed items from HEP: flipping cards, picking up blocks and coins, fastening buttons, mod-max v.c and demonstration, pt's wife was present.   PATIENT EDUCATION: Education details: attention to Rt side, safety for home, importance of safely incorporating RUE into activities, coordination HEP. Person educated: Spouse Education method: Explanation Education comprehension: verbalized understanding      HOME EXERCISE PROGRAM: 09/14/22:  RUE and bilateral hand functional use HEP    GOALS: Potential Goals reviewed with patient? Yes  SHORT TERM GOALS: Target date: 10/06/22; extended due to scheduling to 10/13/2022  Pt/caregiver will be independent with HEP for RUE functional use/coordination.   Goal status: MET  2.  Pt/caregiver will verbalize understanding of visual compensation strategies and visual HEP. Goal status: PARTIALLY MET - Unable to address visual HEP d/t aphasia and cognitive deficits  3.  Pt will perform simple tabletop visual scanning with at least 85% accuracy. Goal status: DEFERRED, improved scanning to Rt w/ auditory cues (calling name, hitting table)   4.  Pt will improve R grip strength to at least 25lbs for incr functional use. Baseline:  17.1lbs Goal status: MET (55 LBS)   5.  Pt will be able to consistently perform bilateral UE tasks with no more than min cueing (such as zipping jacket, using pump of hand sanitizer, folding towels, etc). Goal status: PARTIALLY MET (Doing towels, hand sanitizer, assist needed to start jacket)     LONG TERM GOALS: Target date: 12/06/22  Pt/caregiver will be independent with HEP for RUE functional use/strength.  Goal status: deferred, initial HEP is appropriate  2.  Pt/caregiver will verbalize understanding of strategies to incr safety/ease/participation with ADLs/IADLs. Goal status:partially met, pt's wife assists with ADLS. Additional  education deferred due to cognition and early  3.  Pt will perform simple environmental scanning with at least 85% accuracy for incr safety. Goal status:  not met 50%  4.  Pt will improve RUE coordination/functional use and cognition as shown by improving score on box and blocks to 15 with RUE. Baseline: 5 blocks Goal status: improved to 11 but not fully met  5.  Pt will use RUE as dominant UE for functional tasks at least 90% of the time. Goal status: met per pt's wife  6.  Pt will be able to perform BADLs with no more than distant supervision. Goal status: not met-min A, for shower wand, min v.c  ASSESSMENT: Pt/ wife agree with plans for d/c. Pt did not fully achieve goals due to severity of deficits.  CLINICAL IMPRESSION:PERFORMANCE DEFICITS in functional skills including ADLs, IADLs, coordination, dexterity, proprioception, strength, FMC, GMC, mobility, balance, decreased knowledge of precautions, decreased knowledge of use of DME, vision, and UE functional use, cognitive skills including attention, learn, memory, perception, problem solving, safety awareness, and understand, and psychosocial skills including environmental adaptation, habits, and routines and behaviors.   IMPAIRMENTS are limiting patient from ADLs, IADLs, and leisure.   COMORBIDITIES has co-morbidities such as vascular dementia  that affects occupational performance. Patient will benefit from skilled OT  to address above impairments and improve overall function.  REHAB POTENTIAL: Good   PLAN: OT FREQUENCY: 2x/week  OT DURATION: 12 weeks +eval  PLANNED INTERVENTIONS: self care/ADL training, therapeutic exercise, therapeutic activity, neuromuscular re-education, passive range of motion, balance training, functional mobility training, fluidotherapy, moist heat, cryotherapy, patient/family education, cognitive remediation/compensation, visual/perceptual remediation/compensation, energy conservation, coping strategies  training, and DME and/or AE instructions  RECOMMENDED OTHER SERVICES: current with Speech therapy, needs to schedule PT (referral in Epic)  CONSULTED AND AGREED WITH PLAN OF CARE: Patient and family member/caregiver  PLAN FOR NEXT SESSION:   d/c OT   Hercules Hasler, OTR/L 10/21/2022, 2:56 PM

## 2022-10-21 NOTE — Therapy (Signed)
OUTPATIENT PHYSICAL THERAPY NEURO TREATMENT/ DISCHARGE SUMMARY    Patient Name: Dustin Blanchard MRN: [id number] DOB:30-Nov-1951, 71 y.o., male Today's Date: 10/21/2022   PCP: Dr. Loletha Grayer at [place] PROVIDER: Barbie Banner, PA-C    PHYSICAL THERAPY DISCHARGE SUMMARY  Visits from Start of Care: 11  Current functional level related to goals / functional outcomes: Supervision for all functional mobility due to aphasia, R inattention and poor cognition    Remaining deficits: See below   Education / Equipment: P TPOC, HEP, home safety   Patient agrees to discharge. Patient goals were partially met. Patient is being discharged due to maximized rehab potential.     PT End of Session - 10/21/22 1233     Visit Number 11    Number of Visits 17    Date for PT Re-Evaluation 11/23/22    Authorization Type Aetna Medicare    Progress Note Due on Visit 20    PT Start Time 1230    PT Stop Time 1300   discharge   PT Time Calculation (min) 30 min    Equipment Utilized During Treatment Gait belt    Activity Tolerance Patient tolerated treatment well    Behavior During Therapy WFL for tasks assessed/performed             Past Medical History:  Diagnosis Date   Dementia [facility])    Past Surgical History:  Procedure Laterality Date   HERNIA REPAIR     HIP SURGERY     IR CT HEAD LTD  08/06/2022   IR CT HEAD LTD  08/06/2022   IR PERCUTANEOUS ART THROMBECTOMY/INFUSION INTRACRANIAL INC DIAG ANGIO  08/06/2022   IR US GUIDE VASC ACCESS RIGHT  08/06/2022   RADIOLOGY WITH ANESTHESIA N/A 08/06/2022   Procedure: IR WITH ANESTHESIA;  Surgeon: Luanne Bras, MD;  Location: [place];  Service: Radiology;  Laterality: N/A;   Patient Active Problem List   Diagnosis Date Noted   Pneumonia of right lower lobe due to infectious organism    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   Sinus pause    Acute combined systolic and diastolic heart failure (HCC)     Atrial fibrillation with RVR (HCC)    Acute on chronic combined systolic and diastolic CHF (congestive heart failure) ([place])    Acute ischemic left MCA stroke ([place]) 08/06/2022    ONSET DATE: 08/26/2022   REFERRING DIAG: I63.9 (ICD-10-CM) - CVA (cerebral vascular accident) ([place])   THERAPY DIAG:  Other lack of coordination  Unsteadiness on feet  Muscle weakness (generalized)  Other abnormalities of gait and mobility  Rationale for Evaluation and Treatment Rehabilitation  SUBJECTIVE:  SUBJECTIVE STATEMENT: Patient reports doing well- agreeable to dc from PT to focus on speech and cognition. Denies falls/near falls.   Pt accompanied by: significant other wife [name]  PERTINENT HISTORY: hypertension, hyperlipidemia, sinus bradycardia/chronic CHF with mild regurgitation, BPH, history of tobacco use, vascular dementia diagnosed 2022   PAIN:  Are you having pain? No   PRECAUTIONS: Fall and Other: aphasia, dysphagia 1 (nectar-thick)  WEIGHT BEARING RESTRICTIONS No  FALLS: Has patient fallen in last 6 months? Yes. Number of falls one fall when he had the stroke, no other falls  PLOF: Needs assistance with gait, Needs assistance with transfers, and uses RW in the home with Supervision, uses transport chair for community mobility  PATIENT GOALS "to get him back to moving and walking better"  OBJECTIVE:   TODAY'S TREATMENT:  Goal assessment:   [facility] PT Assessment - 10/21/22 0001       Standardized Balance Assessment   Five times sit to stand comments  14.8    10 Meter Walk .103ms      Berg Balance Test   Sit to Stand Able to stand without using hands and stabilize independently    Standing Unsupported Able to stand safely 2 minutes    Sitting with Back Unsupported but Feet Supported on Floor  or Stool Able to sit safely and securely 2 minutes    Stand to Sit Sits safely with minimal use of hands    Transfers Able to transfer safely, minor use of hands    Standing Unsupported with Eyes Closed Able to stand 10 seconds safely    Standing Unsupported with Feet Together Able to place feet together independently and stand 1 minute safely    From Standing, Reach Forward with Outstretched Arm Can reach forward >12 cm safely (5")    From Standing Position, Pick up Object from Floor Able to pick up shoe, needs supervision    From Standing Position, Turn to Look Behind Over each Shoulder Looks behind from both sides and weight shifts well    Turn 360 Degrees Able to turn 360 degrees safely one side only in 4 seconds or less    Standing Unsupported, Alternately Place Feet on Step/Stool Able to stand independently and complete 8 steps >20 seconds    Standing Unsupported, One Foot in Front Able to place foot tandem independently and hold 30 seconds    Standing on One Leg Able to lift leg independently and hold > 10 seconds    Total Score 52      Timed Up and Go Test   Normal TUG (seconds) 14.7               PATIENT EDUCATION: Education details: continue HEP Person educated: Patient and Spouse Education method: ECustomer service managerEducation comprehension: verbalized understanding, returned demonstration, verbal cues required, and needs further education   HOME EXERCISE PROGRAM: Access Code: [email or web address]: [email or web address] Date: 09/17/2022 Prepared by: TExcell Seltzer Exercises - Side Stepping with Counter Support  - 1 x daily - 7 x weekly - 1 sets - 5 reps - Tandem Walking with Counter Support  - 1 x daily - 7 x weekly - 1 sets - 5 reps - Alternating Step Taps with Counter Support  - 1 x daily - 7 x weekly - 2 sets - 10 reps   GOALS: Goals reviewed with patient? Yes  SHORT TERM GOALS: Target date: 10/12/2022  Pt will be independent with  initial HEP for improved strength, balance, transfers and gait.  Baseline: provided Goal status: MET  2.  Pt will improve gait velocity to at least 2.0 ft/sec for improved gait efficiency and performance at Supervision level   Baseline: 1.82 ft/sec (9/25); 2.74f/s or .848m Goal status: MET  3.  Pt will improve normal TUG to less than or equal to 30 seconds for improved functional mobility and decreased fall risk.  Baseline: 40.43 sec (9/25); 12.97s Goal status: MET  4.  Pt will improve Berg score to 30/56 for decreased fall risk  Baseline: 26/56 (9/25); 48/56 Goal status: MET  5.  Pt will improve 5 x STS to less than or equal to 30 seconds to demonstrate improved functional strength and transfer efficiency.   Baseline: 34.62 sec (9/25)17.97s with B UE Goal status: MET   LONG TERM GOALS: Target date: 11/09/2022  Pt will be independent with final HEP for improved strength, balance, transfers and gait. Baseline: provided Goal status: MET  2.  Pt will improve gait velocity to at least 3.66f366f  for improved gait efficiency and performance at Supervision level   Baseline: 1.82 ft/sec (9/25); .69m42mr 3.15ft60foal status: NOT MET  3.  Pt will improve normal TUG to less than or equal to 9 seconds for improved functional mobility and decreased fall risk.  Baseline: 40.43 sec (9/25); 14.7s Goal status: NOT MET  4.  Pt will improve Berg score to 52/56 for decreased fall risk  Baseline: 26/56 (925); 52/56 Goal status: MET  5.  Pt will improve 5 x STS to less than or equal to 12 seconds to demonstrate improved functional strength and transfer efficiency.   Baseline: 34.62 sec (9/25); 14.8s Goal status: NOT MET   ASSESSMENT:  CLINICAL IMPRESSION: Patient seen for skilled PT session with emphasis on goal assessment and dc. He met 2/5 LTG, but made god progress toward remaining LTGs. He is significantly limited in his progress due to his level of aphasia and cognition. He  is unsafe and unable to progress toward more complex balance tasks due to his limited ability to safely follow mutli-step cues. Patient demonstrated increased fall risk noted by score of 52/56 on the Berg [facility].  <45/56 = fall risk, <42/56 = predictive of recurrent falls, <40/56 = 100% fall risk  >41 = independent, 21-40 = assistive device, 0-20 = wheelchair level  MDC 6.9 (4 pts 45-56, 5 pts 35-44, 7 pts 25-34) (ANPTA Core Set of Outcome Measures for Adults with Neurologic Conditions, 2018). Five times Sit to Stand Test (FTSS) Method: Use a straight back chair with a solid seat that is 17-18" high. Ask participant to sit on the chair with arms folded across their chest.   Instructions: "Stand up and sit down as quickly as possible 5 times, keeping your arms folded across your chest."   Measurement: Stop timing when the participant touches the chair in sitting the 5th time.  TIME: 14.8 sec  Cut off scores indicative of increased fall risk: >12 sec CVA, >16 sec PD, >13 sec vestibular (ANPTA Core Set of Outcome Measures for Adults with Neurologic Conditions, 2018). Patient completed the Timed Up and Go test (TUG) in 14.7 seconds.  Geriatrics: need for further assessment of fall risk: ? 12 sec; Recurrent falls: > 15 sec; Vestibular Disorders fall risk: > 15 sec; Parkinson's Disease fall risk: > 16 sec ([email or web address]). 10 Meter Walk Test: Patient instructed to walk 10 meters (32.8 ft) as quickly and as safely as possible at their normal speed x2 and at a fast speed  x2. Time measured from 2 meter mark to 8 meter mark to accommodate ramp-up and ramp-down.  Normal speed: .75ms Cut off scores: <0.4 m/s = household Ambulator, 0.4-0.8 m/s = limited community Ambulator, >0.8 m/s = community Ambulator, >1.2 m/s = crossing a street, <1.0 = increased fall risk MCID 0.05 m/s (small), 0.13 m/s (moderate), 0.06 m/s (significant)  (ANPTA Core Set of Outcome Measures for Adults with Neurologic  Conditions, 2018). Patient to dc from PT at this time.   \  OBJECTIVE IMPAIRMENTS Abnormal gait, decreased balance, decreased cognition, decreased knowledge of condition, decreased knowledge of use of DME, decreased mobility, decreased strength, decreased safety awareness, and impaired vision/preception.   ACTIVITY LIMITATIONS carrying, lifting, bending, standing, squatting, stairs, and transfers  PARTICIPATION LIMITATIONS: meal prep, cleaning, laundry, medication management, interpersonal relationship, driving, shopping, community activity, and yard work  PERSONAL FACTORS 1-2 comorbidities:    PMH that includes:  hypertension, hyperlipidemia, sinus bradycardia/chronic CHF with mild regurgitation, BPH, history of tobacco use, vascular dementia diagnosed 2022 are also affecting patient's functional outcome.   REHAB POTENTIAL: Fair history of dementia and severity of R neglect/inattention  CLINICAL DECISION MAKING: Stable/uncomplicated  EVALUATION COMPLEXITY: Moderate  PLAN: PT FREQUENCY: 2x/week  PT DURATION: 8 weeks  PLANNED INTERVENTIONS: Therapeutic exercises, Therapeutic activity, Neuromuscular re-education, Balance training, Gait training, Patient/Family education, Self Care, Joint mobilization, Stair training, Vestibular training, Canalith repositioning, Visual/preceptual remediation/compensation, Orthotic/Fit training, DME instructions, Aquatic Therapy, Dry Needling, Cognitive remediation, Electrical stimulation, Wheelchair mobility training, Cryotherapy, Moist heat, Taping, Manual therapy, and Re-evaluation  PLAN FOR NEXT SESSION: dc from PT   JDebbora Dus PT, DPT JDebbora Dus PT, DPT, CBIS  10/21/2022, 1:07 PM

## 2022-10-27 ENCOUNTER — Ambulatory Visit: Payer: Medicare HMO | Admitting: Occupational Therapy

## 2022-10-27 ENCOUNTER — Ambulatory Visit: Payer: Medicare HMO | Admitting: Speech Pathology

## 2022-10-27 DIAGNOSIS — R278 Other lack of coordination: Secondary | ICD-10-CM | POA: Diagnosis not present

## 2022-10-27 DIAGNOSIS — R4701 Aphasia: Secondary | ICD-10-CM

## 2022-10-27 DIAGNOSIS — R41841 Cognitive communication deficit: Secondary | ICD-10-CM

## 2022-10-27 DIAGNOSIS — R482 Apraxia: Secondary | ICD-10-CM

## 2022-10-27 DIAGNOSIS — R1310 Dysphagia, unspecified: Secondary | ICD-10-CM

## 2022-10-27 NOTE — Therapy (Signed)
OUTPATIENT SPEECH LANGUAGE PATHOLOGY TREATMENT NOTE   Patient Name: Dustin Blanchard MRN: [id number] DOB:[birth year], 71 y.o., male Today's Date: 10/27/2022  PCP: [facility], [facility] PROVIDER: Vaughan Basta, Ed[address] Circle, PA-C   END OF SESSION:   End of Session - 10/27/22 0848     Visit Number 14    Number of Visits 25    Date for SLP Re-Evaluation 11/30/22    Authorization Type [employer]    Progress Note Due on Visit 10    SLP Start Time [phone]    SLP Stop Time  0930    SLP Time Calculation (min) 41 min    Activity Tolerance Patient tolerated treatment well                    Past Medical History:  Diagnosis Date   Dementia [facility])    Past Surgical History:  Procedure Laterality Date   HERNIA REPAIR     HIP SURGERY     IR CT HEAD LTD  08/06/2022   IR CT HEAD LTD  08/06/2022   IR PERCUTANEOUS ART THROMBECTOMY/INFUSION INTRACRANIAL INC DIAG ANGIO  08/06/2022   IR US GUIDE VASC ACCESS RIGHT  08/06/2022   RADIOLOGY WITH ANESTHESIA N/A 08/06/2022   Procedure: IR WITH ANESTHESIA;  Surgeon: Luanne Bras, MD;  Location: [place];  Service: Radiology;  Laterality: N/A;   Patient Active Problem List   Diagnosis Date Noted   Pneumonia of right lower lobe due to infectious organism    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 Is moving food posteriorly and  swallowing with verbal cues for chewing and taking small sips.   PAIN:  Are you having pain? No  OBJECTIVE:   TODAY'S TREATMENT:  10-27-22: Target attention, visual scanning, receptive ID of verbally dictated items using Lingraphica touch talk device set to [facility]. Usual repetition required and verbal cues for pt to imitate to aid in attention/recall of targeted item. Improving visual scanning observed with decreasing multimodal cues to aid in scanning R to ID objects on device. Accuracy on initial attempt 40%; with max-A increases to 80% accuracy.   10-21-22: SLP constructed simple low tech AAC, using vertical options to aid in expressive communication of basic wants/needs. SLP modeled use of device and provided direct instruction to spouse on how to implement at home when pt is having difficulty expressing wants/needs. Following, pt able to select dictated option in 2/5 trials with max-A. Using melody to produce functional phrase "I want..." pt able to imitate with max-A x4 and produce with mod-I x1 "I want soda." Education on providing simple options and Y/N to determine wants/needs. Use of confirmation to confirm pt made preferred selection. SLP modeled for spouse. Pt verbalizes understanding through teach back. Spouse reports pt with little interest in chopped foods, appears to be tolerating thin liquids though still providing some thickened. Education on manipulation of solids requires  practice with solids.   10-19-22: SLP reviewed results from MBSS and provided recommendations for diet modification. Recommend thin liquids with minced and moist or chopped solids. Provided education on modifications spouse could make to enhance swallow efficacy at meal time. Provided education on oral hygiene recs, pt with scheduled dentist appointment. SLP led pt through modified melodic intonation therapy. After listening to familiar song, pt able to complete lyric with 12/12 trials with x1 occurrence of  self-correction.      PATIENT EDUCATION: Education details: see above Person educated: Patient and Spouse Education method: Explanation, Demonstration, and Handouts Education comprehension: verbalized understanding, returned demonstration, and needs further education   GOALS: Goals reviewed with patient? Yes   SHORT TERM GOALS: Target date: 10/12/2022   Pt will average 80% accuracy in practical naming tasks with usual mod-A over 2 sessions.  Baseline: 10-06-22, 10-08-22 Goal status: MET   2.  Spouse will teach back supportive communication strategies to aid in auditory comprehension at home with supervision A.   Baseline: 10-12-22 Goal status: MET   3.  Pt will comprehend  written choice f:3 at word level with known context 80% to make accurate selections of preferences, answer questions at home with occasional min A Baseline: 10-08-22 Goal status: MET   4.  Pt will be intelligible in 2 word phrase production in 15/20 trials with direct model and usual mod-A  Baseline: 10-12-22 Goal status: PARTIALLY MET   5.  Pt will complete augmentative communication/SGD canidacy assessment to determine appropriate communication device to support communication of wants/needs/thoughts Baseline: 10-12-22 Goal status: NOT MET, ONGOING     LONG TERM GOALS: Target date: 11/30/2022    Pt will use AAC/multimodal communicate to communicate choices, wants at home with family and caregiver over 1 week Baseline:  Goal status: ONGOING   2.  Pt will name 5 items in personally relevant categories with occasional mod A over 2 sessions Baseline:  Goal status: ONGOING   3.  Pt will employ dysarthria strategies resulting in 50% intelligibility in self-generated words and phrases with usual mod-A Baseline:  Goal status: ONGOING   4.  Pt will participate in clinical swallow evaluation to assess for candidacy for upgraded diet textures and liquid consistencies PRN Baseline:  Goal status: ONGOING      ASSESSMENT:   CLINICAL IMPRESSION: Patient is a 71 y.o. M who was seen today for cognitive linguistic and dysphagia evaluation following CVA. Pt presenting with severe non-fluent aphasia, moderate dysarthria, oropharyngeal dysphagia, and cognitive deficits in areas of attention, memory, executive functioning.  Significant expressive > receptive language deficits impacting overall communication abilities. Pt spouse reporting usual frustration at home, though are employing yes/no questioning to determine wants/needs. Ongoing education and training to rehabilitate expressive and receptive language. Improving ability to repeat direct models and then subsequently name targeted items with short delay. SLP has engaged pt in variety of tasks to assess for speech generating device candidacy. At this time, R visual inattention primary barrier. Pt with improving object discrimination and ability to ID objects both real and pictured. Appears to be tolerating thin liquids during clincal swallow evaluation. Continues to not be stimulible for dysphagia exercises or strategies d/t impairment evidenced with following directions. Repeat instrumental swallow evaluation has been ordered to determine if diet upgrade is appropriate at this time. Severity of cognitive impairments continues to be difficult to assess 2/2 communication impairment. Pt benefiting from usual repetition, use of simple and direct language, visual and verbal cueing for participation in assessment. Ongoing  education for spouse re: supportive communication strategies. SLP recommends continued skilled ST intervention course to facilitate improved communication efficacy, reduce caregiver burden, and improve QoL.    OBJECTIVE IMPAIRMENTS include attention, memory, executive functioning, aphasia, dysarthria, and dysphagia. These impairments are limiting patient from ADLs/IADLs, effectively communicating at home and in community, and safety when swallowing.  Factors affecting potential to achieve goals and functional outcome are ability to learn/carryover information, co-morbidities, previous level of function, and financial resources. Patient will benefit from skilled SLP services to address above impairments and improve overall function.   REHAB POTENTIAL: Fair co-morbidities   PLAN: SLP FREQUENCY: 2x/week   SLP DURATION: 12 weeks   PLANNED INTERVENTIONS: Aspiration precaution training, Pharyngeal strengthening exercises, Diet toleration management , Language facilitation, Trials of upgraded texture/liquids, Cueing hierachy, Cognitive reorganization, Internal/external aids, Oral motor exercises, Functional tasks, Multimodal communication approach, SLP instruction and feedback, Compensatory strategies, Patient/family education, and Re-evaluation   Su Monks, CCC-SLP 10/27/2022, 8:49 AM

## 2022-10-27 NOTE — Patient Instructions (Signed)
At Home Visual Scanning Activities:  Alphabet board Matching game Matching Uno card colors Matching paint chips  Sorting coins  Small puzzles   Start simple- field of 3. Once successful, increase field to encourage more scanning Use bright sheets of paper to aid in complete scanning ("all the way to edge of the [color] Encourage using finger to guide visual scanning "Keep looking right until you see what you're looking for"

## 2022-10-28 ENCOUNTER — Ambulatory Visit: Payer: Medicare HMO | Admitting: Occupational Therapy

## 2022-10-28 ENCOUNTER — Ambulatory Visit: Payer: Medicare HMO | Admitting: Speech Pathology

## 2022-10-28 DIAGNOSIS — R1310 Dysphagia, unspecified: Secondary | ICD-10-CM

## 2022-10-28 DIAGNOSIS — R4701 Aphasia: Secondary | ICD-10-CM

## 2022-10-28 DIAGNOSIS — R41841 Cognitive communication deficit: Secondary | ICD-10-CM

## 2022-10-28 DIAGNOSIS — R482 Apraxia: Secondary | ICD-10-CM

## 2022-10-28 DIAGNOSIS — R278 Other lack of coordination: Secondary | ICD-10-CM | POA: Diagnosis not present

## 2022-10-28 NOTE — Therapy (Signed)
OUTPATIENT SPEECH LANGUAGE PATHOLOGY TREATMENT NOTE   Patient Name: Dustin Blanchard MRN: [id number] DOB:01/16/1951, 71 y.o., male Today's Date: 10/28/2022  PCP: [facility] PROVIDER: Barbie Banner, PA-C   END OF SESSION:   End of Session - 10/28/22 0929     Visit Number 15    Number of Visits 25    Date for SLP Re-Evaluation 11/30/22    Authorization Type [employer] Medicare    Progress Note Due on Visit 10    SLP Start Time [phone]    SLP Stop Time  1013    SLP Time Calculation (min) 44 min    Activity Tolerance Patient tolerated treatment well                    Past Medical History:  Diagnosis Date   Dementia [facility])    Past Surgical History:  Procedure Laterality Date   HERNIA REPAIR     HIP SURGERY     IR CT HEAD LTD  08/06/2022   IR CT HEAD LTD  08/06/2022   IR PERCUTANEOUS ART THROMBECTOMY/INFUSION INTRACRANIAL INC DIAG ANGIO  08/06/2022   IR US GUIDE VASC ACCESS RIGHT  08/06/2022   RADIOLOGY WITH ANESTHESIA N/A 08/06/2022   Procedure: IR WITH ANESTHESIA;  Surgeon: Luanne Bras, MD;  Location: [place];  Service: Radiology;  Laterality: N/A;   Patient Active Problem List   Diagnosis Date Noted   Pneumonia of right lower lobe due to infectious organism    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 No  OBJECTIVE:   TODAY'S TREATMENT:  10-28-22: Target  fluent verbal expression of phrases through use of modified melodic intonation therapy. Initiate trials with familiar songs. Following listening to song, SLP sings portion of lyric with pt able to complete fluently and clearly in 80% of trials. Move to use of melodic intonation for functional phrases. Pt able to achieve fluent and clear articulation of 10/10 4-7 word phrases with usual choral production faded to mouthing visual cues. Pt demonstrates self-correction x2, fixing paraphasia to targeted phrase with mod-I. Encouraged pt and spouse to continue working on visual scanning and consideration for using music as therapeutic task occasionally at home- SLP recommends listening to song once through then again with pt attempting to sing along.   10-27-22: Target attention, visual scanning, receptive ID of verbally dictated items using Lingraphica touch talk device set to [facility]. Usual repetition required and verbal cues for pt to imitate to aid in attention/recall of targeted item. Improving visual scanning observed with decreasing multimodal cues to aid in scanning R to ID objects on device. Accuracy on initial attempt 40%; with max-A increases to 80% accuracy.   10-21-22: SLP constructed simple low tech AAC, using vertical options to aid in expressive communication of basic wants/needs. SLP modeled use of device and provided direct instruction to spouse on how to implement at home when  pt is having difficulty expressing wants/needs. Following, pt able to select dictated option in 2/5 trials with max-A. Using melody to produce functional phrase "I want..." pt able to imitate with max-A x4 and produce with mod-I x1 "I want soda." Education on providing simple options and Y/N to determine wants/needs. Use of confirmation to confirm pt made preferred selection. SLP modeled for spouse. Pt verbalizes understanding through teach back. Spouse reports pt with little interest in chopped foods, appears to be tolerating thin  liquids though still providing some thickened. Education on manipulation of solids requires practice with solids.   10-19-22: SLP reviewed results from MBSS and provided recommendations for diet modification. Recommend thin liquids with minced and moist or chopped solids. Provided education on modifications spouse could make to enhance swallow efficacy at meal time. Provided education on oral hygiene recs, pt with scheduled dentist appointment. SLP led pt through modified melodic intonation therapy. After listening to familiar song, pt able to complete lyric with 12/12 trials with x1 occurrence of self-correction.      PATIENT EDUCATION: Education details: see above Person educated: Patient and Spouse Education method: Explanation, Demonstration, and Handouts Education comprehension: verbalized understanding, returned demonstration, and needs further education   GOALS: Goals reviewed with patient? Yes   SHORT TERM GOALS: Target date: 10/12/2022   Pt will average 80% accuracy in practical naming tasks with usual mod-A over 2 sessions.  Baseline: 10-06-22, 10-08-22 Goal status: MET   2.  Spouse will teach back supportive communication strategies to aid in auditory comprehension at home with supervision A.   Baseline: 10-12-22 Goal status: MET   3.  Pt will comprehend  written choice f:3 at word level with known context 80% to make accurate selections of preferences, answer questions at home with occasional min A Baseline: 10-08-22 Goal status: MET   4.  Pt will be intelligible in 2 word phrase production in 15/20 trials with direct model and usual mod-A  Baseline: 10-12-22 Goal status: PARTIALLY MET   5.  Pt will complete augmentative communication/SGD canidacy assessment to determine appropriate communication device to support communication of wants/needs/thoughts Baseline: 10-12-22 Goal status: NOT MET, ONGOING     LONG TERM GOALS: Target date: 11/30/2022    Pt will use  AAC/multimodal communicate to communicate choices, wants at home with family and caregiver over 1 week Baseline:  Goal status: ONGOING   2.  Pt will name 5 items in personally relevant categories with occasional mod A over 2 sessions Baseline:  Goal status: ONGOING   3.  Pt will employ dysarthria strategies resulting in 50% intelligibility in self-generated words and phrases with usual mod-A Baseline:  Goal status: ONGOING   4.  Pt will participate in clinical swallow evaluation to assess for candidacy for upgraded diet textures and liquid consistencies PRN Baseline:  Goal status: ONGOING     ASSESSMENT:   CLINICAL IMPRESSION: Patient is a 71 y.o. M who was seen today for cognitive linguistic and dysphagia evaluation following CVA. Pt presenting with severe non-fluent aphasia, moderate dysarthria, oropharyngeal dysphagia, and cognitive deficits in areas of attention, memory, executive functioning.  Significant expressive > receptive language deficits impacting overall communication abilities. Pt spouse reporting usual frustration at home, though are employing yes/no questioning to determine wants/needs. Ongoing education and training to rehabilitate expressive and receptive language. Improving ability to repeat direct models and then subsequently name targeted items with short delay. SLP has engaged pt in variety of tasks to assess for speech generating device candidacy. At this  time, R visual inattention primary barrier. Pt with improving object discrimination and ability to ID objects both real and pictured. Appears to be tolerating thin liquids during clincal swallow evaluation. Continues to not be stimulible for dysphagia exercises or strategies d/t impairment evidenced with following directions. Repeat instrumental swallow evaluation has been ordered to determine if diet upgrade is appropriate at this time. Severity of cognitive impairments continues to be difficult to assess 2/2  communication impairment. Pt benefiting from usual repetition, use of simple and direct language, visual and verbal cueing for participation in assessment. Ongoing education for spouse re: supportive communication strategies. SLP recommends continued skilled ST intervention course to facilitate improved communication efficacy, reduce caregiver burden, and improve QoL.    OBJECTIVE IMPAIRMENTS include attention, memory, executive functioning, aphasia, dysarthria, and dysphagia. These impairments are limiting patient from ADLs/IADLs, effectively communicating at home and in community, and safety when swallowing. Factors affecting potential to achieve goals and functional outcome are ability to learn/carryover information, co-morbidities, previous level of function, and financial resources. Patient will benefit from skilled SLP services to address above impairments and improve overall function.   REHAB POTENTIAL: Fair co-morbidities   PLAN: SLP FREQUENCY: 2x/week   SLP DURATION: 12 weeks   PLANNED INTERVENTIONS: Aspiration precaution training, Pharyngeal strengthening exercises, Diet toleration management , Language facilitation, Trials of upgraded texture/liquids, Cueing hierachy, Cognitive reorganization, Internal/external aids, Oral motor exercises, Functional tasks, Multimodal communication approach, SLP instruction and feedback, Compensatory strategies, Patient/family education, and Re-evaluation   Su Monks, CCC-SLP 10/28/2022, 9:29 AM

## 2022-11-02 ENCOUNTER — Encounter: Payer: Medicare HMO | Admitting: Speech Pathology

## 2022-11-03 NOTE — Therapy (Unsigned)
OUTPATIENT SPEECH LANGUAGE PATHOLOGY TREATMENT NOTE   Patient Name: Dustin Blanchard MRN: [id number] DOB:August 09, 1951, 71 y.o., male Today's Date: 11/04/2022  PCP: [facility], [facility] PROVIDER: Vaughan Basta, Edman Circle, PA-C   END OF SESSION:   End of Session - 11/04/22 1017     Visit Number 16    Number of Visits 25    Date for SLP Re-Evaluation 11/30/22    Authorization Type [employer]    Progress Note Due on Visit 10    SLP Start Time 1017    SLP Stop Time  1059    SLP Time Calculation (min) 42 min    Activity Tolerance Patient tolerated treatment well                     Past Medical History:  Diagnosis Date   Dementia [facility])    Past Surgical History:  Procedure Laterality Date   HERNIA REPAIR     HIP SURGERY     IR CT HEAD LTD  08/06/2022   IR CT HEAD LTD  08/06/2022   IR PERCUTANEOUS ART THROMBECTOMY/INFUSION INTRACRANIAL INC DIAG ANGIO  08/06/2022   IR US GUIDE VASC ACCESS RIGHT  08/06/2022   RADIOLOGY WITH ANESTHESIA N/A 08/06/2022   Procedure: IR WITH ANESTHESIA;  Surgeon: Luanne Bras, MD;  Location: [facility];  Service: Radiology;  Laterality: N/A;   Patient Active Problem List   Diagnosis Date Noted   Pneumonia of right lower lobe due to infectious organism    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 alright"   PAIN:  Are you having pain? No  OBJECTIVE:   TODAY'S  TREATMENT:  11-03-22: Target fluent verbal expression of phrases through use of modified melodic intonation therapy. Use of personally relevant, functional phrases. Pt achieved intelligible production of 100% of targeted phrases with usual mod-A of choral production, faded to direct model. Target receptive and expressive ID of common objects. Pt sorts into 2 distinct categories with 70% accuracy given usual mod A. Names 8/8 items with direct model, occasional success of phrase completion.   10-28-22: Target fluent verbal expression of phrases through use of modified melodic intonation therapy. Initiate trials with familiar songs. Following listening to song, SLP sings portion of lyric with pt able to complete fluently and clearly in 80% of trials. Move to use of melodic intonation for functional phrases. Pt able to achieve fluent and clear articulation of 10/10 4-7 word phrases with usual choral production faded to mouthing visual cues. Pt demonstrates self-correction x2, fixing paraphasia to targeted phrase with mod-I. Encouraged pt and spouse to continue working on visual scanning and consideration for using music as therapeutic task occasionally at home- SLP recommends listening to song once through then again with pt attempting to sing along.   10-27-22: Target attention, visual scanning, receptive ID of verbally dictated items using Lingraphica touch talk device set to [facility]. Usual repetition required and verbal cues for pt to  imitate to aid in attention/recall of targeted item. Improving visual scanning observed with decreasing multimodal cues to aid in scanning R to ID objects on device. Accuracy on initial attempt 40%; with max-A increases to 80% accuracy.   10-21-22: SLP constructed simple low tech AAC, using vertical options to aid in expressive communication of basic wants/needs. SLP modeled use of device and provided direct instruction to spouse on how to implement at home when pt is having difficulty  expressing wants/needs. Following, pt able to select dictated option in 2/5 trials with max-A. Using melody to produce functional phrase "I want..." pt able to imitate with max-A x4 and produce with mod-I x1 "I want soda." Education on providing simple options and Y/N to determine wants/needs. Use of confirmation to confirm pt made preferred selection. SLP modeled for spouse. Pt verbalizes understanding through teach back. Spouse reports pt with little interest in chopped foods, appears to be tolerating thin liquids though still providing some thickened. Education on manipulation of solids requires practice with solids.   10-19-22: SLP reviewed results from MBSS and provided recommendations for diet modification. Recommend thin liquids with minced and moist or chopped solids. Provided education on modifications spouse could make to enhance swallow efficacy at meal time. Provided education on oral hygiene recs, pt with scheduled dentist appointment. SLP led pt through modified melodic intonation therapy. After listening to familiar song, pt able to complete lyric with 12/12 trials with x1 occurrence of self-correction.      PATIENT EDUCATION: Education details: see above Person educated: Patient and Spouse Education method: Explanation, Demonstration, and Handouts Education comprehension: verbalized understanding, returned demonstration, and needs further education   GOALS: Goals reviewed with patient? Yes   SHORT TERM GOALS: Target date: 10/12/2022   Pt will average 80% accuracy in practical naming tasks with usual mod-A over 2 sessions.  Baseline: 10-06-22, 10-08-22 Goal status: MET   2.  Spouse will teach back supportive communication strategies to aid in auditory comprehension at home with supervision A.   Baseline: 10-12-22 Goal status: MET   3.  Pt will comprehend  written choice f:3 at word level with known context 80% to make accurate selections of preferences, answer questions at  home with occasional min A Baseline: 10-08-22 Goal status: MET   4.  Pt will be intelligible in 2 word phrase production in 15/20 trials with direct model and usual mod-A  Baseline: 10-12-22 Goal status: PARTIALLY MET   5.  Pt will complete augmentative communication/SGD canidacy assessment to determine appropriate communication device to support communication of wants/needs/thoughts Baseline: 10-12-22 Goal status: NOT MET, ONGOING     LONG TERM GOALS: Target date: 11/30/2022    Pt will use AAC/multimodal communicate to communicate choices, wants at home with family and caregiver over 1 week Baseline:  Goal status: ONGOING   2.  Pt will name 5 items in personally relevant categories with occasional mod A over 2 sessions Baseline:  Goal status: ONGOING   3.  Pt will employ dysarthria strategies resulting in 50% intelligibility in self-generated words and phrases with usual mod-A Baseline:  Goal status: ONGOING   4.  Pt will participate in clinical swallow evaluation to assess for candidacy for upgraded diet textures and liquid consistencies PRN Baseline:  Goal status: ONGOING     ASSESSMENT:   CLINICAL IMPRESSION: Patient is a 71 y.o. M who was seen today for cognitive linguistic and dysphagia evaluation following CVA. Pt presenting with severe non-fluent aphasia, moderate dysarthria, oropharyngeal dysphagia, and cognitive deficits  in areas of attention, memory, executive functioning.  Significant expressive > receptive language deficits impacting overall communication abilities. Pt spouse reporting usual frustration at home, though are employing yes/no questioning to determine wants/needs. Ongoing education and training to rehabilitate expressive and receptive language. Improving ability to repeat direct models and then subsequently name targeted items with short delay. SLP has engaged pt in variety of tasks to assess for speech generating device candidacy. At this time, R visual  inattention primary barrier. Pt with improving object discrimination and ability to ID objects both real and pictured. Appears to be tolerating thin liquids during clincal swallow evaluation. Continues to not be stimulible for dysphagia exercises or strategies d/t impairment evidenced with following directions. Repeat instrumental swallow evaluation has been ordered to determine if diet upgrade is appropriate at this time. Severity of cognitive impairments continues to be difficult to assess 2/2 communication impairment. Pt benefiting from usual repetition, use of simple and direct language, visual and verbal cueing for participation in assessment. Ongoing education for spouse re: supportive communication strategies. SLP recommends continued skilled ST intervention course to facilitate improved communication efficacy, reduce caregiver burden, and improve QoL.    OBJECTIVE IMPAIRMENTS include attention, memory, executive functioning, aphasia, dysarthria, and dysphagia. These impairments are limiting patient from ADLs/IADLs, effectively communicating at home and in community, and safety when swallowing. Factors affecting potential to achieve goals and functional outcome are ability to learn/carryover information, co-morbidities, previous level of function, and financial resources. Patient will benefit from skilled SLP services to address above impairments and improve overall function.   REHAB POTENTIAL: Fair co-morbidities   PLAN: SLP FREQUENCY: 2x/week   SLP DURATION: 12 weeks   PLANNED INTERVENTIONS: Aspiration precaution training, Pharyngeal strengthening exercises, Diet toleration management , Language facilitation, Trials of upgraded texture/liquids, Cueing hierachy, Cognitive reorganization, Internal/external aids, Oral motor exercises, Functional tasks, Multimodal communication approach, SLP instruction and feedback, Compensatory strategies, Patient/family education, and Re-evaluation   Su Monks, CCC-SLP 11/04/2022, 10:18 AM

## 2022-11-04 ENCOUNTER — Ambulatory Visit: Payer: Medicare HMO | Admitting: Occupational Therapy

## 2022-11-04 ENCOUNTER — Ambulatory Visit: Payer: Medicare HMO | Admitting: Speech Pathology

## 2022-11-04 DIAGNOSIS — R482 Apraxia: Secondary | ICD-10-CM

## 2022-11-04 DIAGNOSIS — R1310 Dysphagia, unspecified: Secondary | ICD-10-CM

## 2022-11-04 DIAGNOSIS — R4701 Aphasia: Secondary | ICD-10-CM

## 2022-11-04 DIAGNOSIS — R41841 Cognitive communication deficit: Secondary | ICD-10-CM

## 2022-11-04 DIAGNOSIS — R278 Other lack of coordination: Secondary | ICD-10-CM | POA: Diagnosis not present

## 2022-11-04 NOTE — Patient Instructions (Signed)
Forde Radon go to the bathroom   Leave me alone   I'm hungry   I'm thirsty  Can I have more  More water please  How are you   I am tired   I need a break   Change the channel

## 2022-11-06 ENCOUNTER — Ambulatory Visit: Payer: Medicare HMO | Admitting: Speech Pathology

## 2022-11-06 ENCOUNTER — Encounter: Payer: Medicare HMO | Admitting: Occupational Therapy

## 2022-11-06 DIAGNOSIS — R41841 Cognitive communication deficit: Secondary | ICD-10-CM

## 2022-11-06 DIAGNOSIS — R4701 Aphasia: Secondary | ICD-10-CM

## 2022-11-06 DIAGNOSIS — R482 Apraxia: Secondary | ICD-10-CM

## 2022-11-06 DIAGNOSIS — R1310 Dysphagia, unspecified: Secondary | ICD-10-CM

## 2022-11-06 DIAGNOSIS — R278 Other lack of coordination: Secondary | ICD-10-CM | POA: Diagnosis not present

## 2022-11-06 NOTE — Therapy (Signed)
OUTPATIENT SPEECH LANGUAGE PATHOLOGY TREATMENT NOTE   Patient Name: Dustin Blanchard MRN: [id number] DOB:12/27/1950, 71 y.o., male Today's Date: 11/06/2022  PCP: [facility], [facility]  REFERRING PROVIDER: Setzer, Sandra J, PA-C   END OF SESSION:   End of Session - 11/06/22 0754     Visit Number 17    Number of Visits 25    Date for SLP Re-Evaluation 11/30/22    Authorization Type [employer] Medicare    Progress Note Due on Visit 10    SLP Start Time 0800    SLP Stop Time  0845    SLP Time Calculation (min) 45 min    Activity Tolerance Patient tolerated treatment well                     Past Medical History:  Diagnosis Date   Dementia (HCC)    Past Surgical History:  Procedure Laterality Date   HERNIA REPAIR     HIP SURGERY     IR CT HEAD LTD  08/06/2022   IR CT HEAD LTD  08/06/2022   IR PERCUTANEOUS ART THROMBECTOMY/INFUSION INTRACRANIAL INC DIAG ANGIO  08/06/2022   IR US GUIDE VASC ACCESS RIGHT  08/06/2022   RADIOLOGY WITH ANESTHESIA N/A 08/06/2022   Procedure: IR WITH ANESTHESIA;  Surgeon: Deveshwar, Sanjeev, MD;  Location: [facility] OR;  Service: Radiology;  Laterality: N/A;   Patient Active Problem List   Diagnosis Date Noted   Pneumonia of right lower lobe due to infectious organism    Left middle cerebral artery stroke (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 No  OBJECTIVE:   TODAY'S TREATMENT:  11-06-22:  Target visual scanning and auditory comprehension in language task of sorting matches along a horizontal presentation. 90% accuracy with usual repetition and verbal cues for scanning R with FO2. Increased difficulty to FO3, with 100% accuracy with usual mod-A faded to min-A for following direction of task and scanning entirety of visual field for activity. Led pt through language task naming 4 items then choosing odd item out, with pt able to name all with direct model, able to ID odd one out with mod-I 4/12 trials. Additional 4 correct with use of questioning cues for overall accuracy of 66%.   11-03-22: Target fluent verbal expression of phrases through use of modified melodic intonation therapy. Use of personally relevant, functional phrases. Pt achieved intelligible production of 100% of targeted phrases with usual mod-A of choral production, faded to direct model. Target receptive and expressive ID of common objects. Pt sorts into 2 distinct categories with 70% accuracy given usual mod A. Names 8/8 items with direct model, occasional success of phrase completion.   10-28-22: Target fluent verbal expression of phrases through use of modified melodic intonation therapy. Initiate trials with familiar songs. Following listening to song, SLP sings portion of lyric with pt able to complete fluently and clearly in 80% of trials. Move to use of melodic intonation   for functional phrases. Pt able to achieve fluent and clear articulation of 10/10 4-7 word phrases with usual choral production faded to mouthing visual cues. Pt demonstrates self-correction x2, fixing paraphasia to targeted phrase with mod-I. Encouraged pt and spouse to continue working on visual scanning and consideration for using music as therapeutic task occasionally at home- SLP recommends listening to song once through then again with pt attempting to sing along.   10-27-22: Target attention, visual scanning, receptive ID of verbally dictated items  using Ling[employer] touch talk device set to [facility]. Usual repetition required and verbal cues for pt to imitate to aid in attention/recall of targeted item. Improving visual scanning observed with decreasing multimodal cues to aid in scanning R to ID objects on device. Accuracy on initial attempt 40%; with max-A increases to 80% accuracy.   10-21-22: SLP constructed simple low tech AAC, using vertical options to aid in expressive communication of basic wants/needs. SLP modeled use of device and provided direct instruction to spouse on how to implement at home when pt is having difficulty expressing wants/needs. Following, pt able to select dictated option in 2/5 trials with max-A. Using melody to produce functional phrase "I want..." pt able to imitate with max-A x4 and produce with mod-I x1 "I want soda." Education on providing simple options and Y/N to determine wants/needs. Use of confirmation to confirm pt made preferred selection. SLP modeled for spouse. Pt verbalizes understanding through teach back. Spouse reports pt with little interest in chopped foods, appears to be tolerating thin liquids though still providing some thickened. Education on manipulation of solids requires practice with solids.   10-19-22: SLP reviewed results from MBSS and provided recommendations for diet modification. Recommend thin liquids with minced and moist or chopped solids. Provided education on modifications spouse could make to enhance swallow efficacy at meal time. Provided education on oral hygiene recs, pt with scheduled dentist appointment. SLP led pt through modified melodic intonation therapy. After listening to familiar song, pt able to complete lyric with 12/12 trials with x1 occurrence of self-correction.      PATIENT EDUCATION: Education details: see above Person educated: Patient and Spouse Education method: Explanation, Demonstration, and Handouts Education comprehension: verbalized understanding, returned  demonstration, and needs further education   GOALS: Goals reviewed with patient? Yes   SHORT TERM GOALS: Target date: 10/12/2022   Pt will average 80% accuracy in practical naming tasks with usual mod-A over 2 sessions.  Baseline: 10-06-22, 10-08-22 Goal status: MET   2.  Spouse will teach back supportive communication strategies to aid in auditory comprehension at home with supervision A.   Baseline: 10-12-22 Goal status: MET   3.  Pt will comprehend  written choice f:3 at word level with known context 80% to make accurate selections of preferences, answer questions at home with occasional min A Baseline: 10-08-22 Goal status: MET   4.  Pt will be intelligible in 2 word phrase production in 15/20 trials with direct model and usual mod-A  Baseline: 10-12-22 Goal status: PARTIALLY MET   5.  Pt will complete augmentative communication/SGD canidacy assessment to determine appropriate communication device to support communication of wants/needs/thoughts Baseline: 10-12-22 Goal status: NOT MET, ONGOING     LONG TERM GOALS: Target date: 11/30/2022    Pt will use AAC/multimodal communicate to communicate choices, wants at home with family and caregiver over 1 week Baseline:  Goal status: ONGOING   2.  Pt will name 5 items in personally relevant categories with occasional mod A  over 2 sessions Baseline:  Goal status: ONGOING   3.  Pt will employ dysarthria strategies resulting in 50% intelligibility in self-generated words and phrases with usual mod-A Baseline:  Goal status: ONGOING   4.  Pt will participate in clinical swallow evaluation to assess for candidacy for upgraded diet textures and liquid consistencies PRN Baseline:  Goal status: ONGOING     ASSESSMENT:   CLINICAL IMPRESSION: Patient is a 71 y.o. M who was seen today for cognitive linguistic and dysphagia evaluation following CVA. Pt presenting with severe non-fluent aphasia, moderate dysarthria, oropharyngeal  dysphagia, and cognitive deficits in areas of attention, memory, executive functioning.  Significant expressive > receptive language deficits impacting overall communication abilities. Pt spouse reporting usual frustration at home, though are employing yes/no questioning to determine wants/needs. Ongoing education and training to rehabilitate expressive and receptive language. Improving ability to repeat direct models and then subsequently name targeted items with short delay. SLP has engaged pt in variety of tasks to assess for speech generating device candidacy. At this time, R visual inattention primary barrier. Pt with improving object discrimination and ability to ID objects both real and pictured. Appears to be tolerating thin liquids during clincal swallow evaluation. Continues to not be stimulible for dysphagia exercises or strategies d/t impairment evidenced with following directions. Repeat instrumental swallow evaluation has been ordered to determine if diet upgrade is appropriate at this time. Severity of cognitive impairments continues to be difficult to assess 2/2 communication impairment. Pt benefiting from usual repetition, use of simple and direct language, visual and verbal cueing for participation in assessment. Ongoing education for spouse re: supportive communication strategies. SLP recommends continued skilled ST intervention course to facilitate improved communication efficacy, reduce caregiver burden, and improve QoL.    OBJECTIVE IMPAIRMENTS include attention, memory, executive functioning, aphasia, dysarthria, and dysphagia. These impairments are limiting patient from ADLs/IADLs, effectively communicating at home and in community, and safety when swallowing. Factors affecting potential to achieve goals and functional outcome are ability to learn/carryover information, co-morbidities, previous level of function, and financial resources. Patient will benefit from skilled SLP services to  address above impairments and improve overall function.   REHAB POTENTIAL: Fair co-morbidities   PLAN: SLP FREQUENCY: 2x/week   SLP DURATION: 12 weeks   PLANNED INTERVENTIONS: Aspiration precaution training, Pharyngeal strengthening exercises, Diet toleration management , Language facilitation, Trials of upgraded texture/liquids, Cueing hierachy, Cognitive reorganization, Internal/external aids, Oral motor exercises, Functional tasks, Multimodal communication approach, SLP instruction and feedback, Compensatory strategies, Patient/family education, and Re-evaluation    L , CCC-SLP 11/06/2022, 7:54 AM 

## 2022-11-10 ENCOUNTER — Encounter: Payer: Medicare HMO | Admitting: Occupational Therapy

## 2022-11-16 ENCOUNTER — Ambulatory Visit: Payer: Medicare HMO | Admitting: Speech Pathology

## 2022-11-16 DIAGNOSIS — R41841 Cognitive communication deficit: Secondary | ICD-10-CM

## 2022-11-16 DIAGNOSIS — R1310 Dysphagia, unspecified: Secondary | ICD-10-CM

## 2022-11-16 DIAGNOSIS — R482 Apraxia: Secondary | ICD-10-CM

## 2022-11-16 DIAGNOSIS — R4701 Aphasia: Secondary | ICD-10-CM

## 2022-11-16 DIAGNOSIS — R278 Other lack of coordination: Secondary | ICD-10-CM | POA: Diagnosis not present

## 2022-11-16 NOTE — Therapy (Signed)
OUTPATIENT SPEECH LANGUAGE PATHOLOGY TREATMENT NOTE   Patient Name: Dustin Blanchard MRN: [id number] DOB:11-11-1951, 71 y.o., male Today's Date: 11/16/2022  PCP: Inc, [place] PROVIDER: Vaughan Basta, Edman Circle, PA-C   END OF SESSION:   End of Session - 11/16/22 1354     Visit Number 18    Number of Visits 25    Date for SLP Re-Evaluation 11/30/22    Authorization Type [employer]    Progress Note Due on Visit 10    SLP Start Time 1354    SLP Stop Time  1437    SLP Time Calculation (min) 43 min    Activity Tolerance Patient tolerated treatment well                      Past Medical History:  Diagnosis Date   Dementia [facility])    Past Surgical History:  Procedure Laterality Date   HERNIA REPAIR     HIP SURGERY     IR CT HEAD LTD  08/06/2022   IR CT HEAD LTD  08/06/2022   IR PERCUTANEOUS ART THROMBECTOMY/INFUSION INTRACRANIAL INC DIAG ANGIO  08/06/2022   IR US GUIDE VASC ACCESS RIGHT  08/06/2022   RADIOLOGY WITH ANESTHESIA N/A 08/06/2022   Procedure: IR WITH ANESTHESIA;  Surgeon: Luanne Bras, MD;  Location: [place];  Service: Radiology;  Laterality: N/A;   Patient Active Problem List   Diagnosis Date Noted   Pneumonia of right lower lobe due to infectious organism    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   Sinus pause    Acute combined systolic and diastolic heart failure (HCC)    Atrial fibrillation with RVR (HCC)    Acute on chronic combined systolic and diastolic CHF (congestive heart failure) ([place])    Acute ischemic left MCA stroke ([place]) 08/06/2022    ONSET DATE: 08/06/2022    REFERRING DIAG: I63.9 (ICD-10-CM) - CVA (cerebral vascular accident) ([place])   THERAPY DIAG:  Apraxia  Aphasia  Dysphagia, unspecified type  Cognitive communication deficit  Rationale for Evaluation and Treatment Rehabilitation  SUBJECTIVE: Pt has been tolerating thin liquids and was able to eat majority of regularly prepared foods  during thanksgiving dinner.  PAIN:  Are you having pain? No  OBJECTIVE:   TODAY'S TREATMENT:  11-16-22: Target visual scanning and matching like objects with pt unable to process instructions for matching of colored cards with multiple attempts from SLP. Switched task to matching letters, with pt able to match 4/6, with R visual cut impacting success of remaining 2. SLP modified presenation with brightly colored sheet to enable scanning to set end point, with pt able to scan R and find remaining 2 letters for accurate matching. Second attempt with increased field of 10, pt with 70% accuracy with usual, faded to intermittently, verbal cues for orientation to task and ongoing cues for scanning R and up or down depending on which line pt has chosen to focus on. Pt requires max-A to scan to far edge of visual field defined by brightly colored piece of paper. An AAC device would likely require modification to accommodate R visual cut as little improvement noted in this area. Attempt to ID and name family members. Receptive ID from [place], 2/5 trials. Names 2/2 with max-A and direct model.   11-06-22: Target visual scanning and auditory comprehension in language task of sorting matches along a horizontal presentation. 90% accuracy with usual repetition and verbal cues for scanning R with FO2. Increased  difficulty to [facility], with 100% accuracy with usual mod-A faded to min-A for following direction of task and scanning entirety of visual field for activity. Led pt through language task naming 4 items then choosing odd item out, with pt able to name all with direct model, able to ID odd one out with mod-I 4/12 trials. Additional 4 correct with use of questioning cues for overall accuracy of 66%.   11-03-22: Target fluent verbal expression of phrases through use of modified melodic intonation therapy. Use of personally relevant, functional phrases. Pt achieved intelligible production of 100% of targeted phrases with  usual mod-A of choral production, faded to direct model. Target receptive and expressive ID of common objects. Pt sorts into 2 distinct categories with 70% accuracy given usual mod A. Names 8/8 items with direct model, occasional success of phrase completion.   10-28-22: Target fluent verbal expression of phrases through use of modified melodic intonation therapy. Initiate trials with familiar songs. Following listening to song, SLP sings portion of lyric with pt able to complete fluently and clearly in 80% of trials. Move to use of melodic intonation for functional phrases. Pt able to achieve fluent and clear articulation of 10/10 4-7 word phrases with usual choral production faded to mouthing visual cues. Pt demonstrates self-correction x2, fixing paraphasia to targeted phrase with mod-I. Encouraged pt and spouse to continue working on visual scanning and consideration for using music as therapeutic task occasionally at home- SLP recommends listening to song once through then again with pt attempting to sing along.   10-27-22: Target attention, visual scanning, receptive ID of verbally dictated items using Lingraphica touch talk device set to [facility]. Usual repetition required and verbal cues for pt to imitate to aid in attention/recall of targeted item. Improving visual scanning observed with decreasing multimodal cues to aid in scanning R to ID objects on device. Accuracy on initial attempt 40%; with max-A increases to 80% accuracy.   10-21-22: SLP constructed simple low tech AAC, using vertical options to aid in expressive communication of basic wants/needs. SLP modeled use of device and provided direct instruction to spouse on how to implement at home when pt is having difficulty expressing wants/needs. Following, pt able to select dictated option in 2/5 trials with max-A. Using melody to produce functional phrase "I want..." pt able to imitate with max-A x4 and produce with mod-I x1 "I want soda." Education on  providing simple options and Y/N to determine wants/needs. Use of confirmation to confirm pt made preferred selection. SLP modeled for spouse. Pt verbalizes understanding through teach back. Spouse reports pt with little interest in chopped foods, appears to be tolerating thin liquids though still providing some thickened. Education on manipulation of solids requires practice with solids.   10-19-22: SLP reviewed results from MBSS and provided recommendations for diet modification. Recommend thin liquids with minced and moist or chopped solids. Provided education on modifications spouse could make to enhance swallow efficacy at meal time. Provided education on oral hygiene recs, pt with scheduled dentist appointment. SLP led pt through modified melodic intonation therapy. After listening to familiar song, pt able to complete lyric with 12/12 trials with x1 occurrence of self-correction.      PATIENT EDUCATION: Education details: see above Person educated: Patient and Spouse Education method: Explanation, Demonstration, and Handouts Education comprehension: verbalized understanding, returned demonstration, and needs further education   GOALS: Goals reviewed with patient? Yes   SHORT TERM GOALS: Target date: 10/12/2022   Pt will average 80% accuracy in  practical naming tasks with usual mod-A over 2 sessions.  Baseline: 10-06-22, 10-08-22 Goal status: MET   2.  Spouse will teach back supportive communication strategies to aid in auditory comprehension at home with supervision A.   Baseline: 10-12-22 Goal status: MET   3.  Pt will comprehend  written choice f:3 at word level with known context 80% to make accurate selections of preferences, answer questions at home with occasional min A Baseline: 10-08-22 Goal status: MET   4.  Pt will be intelligible in 2 word phrase production in 15/20 trials with direct model and usual mod-A  Baseline: 10-12-22 Goal status: PARTIALLY MET   5.  Pt will  complete augmentative communication/SGD canidacy assessment to determine appropriate communication device to support communication of wants/needs/thoughts Baseline: 10-12-22 Goal status: NOT MET, ONGOING     LONG TERM GOALS: Target date: 11/30/2022    Pt will use AAC/multimodal communicate to communicate choices, wants at home with family and caregiver over 1 week Baseline:  Goal status: ONGOING   2.  Pt will name 5 items in personally relevant categories with occasional mod A over 2 sessions Baseline:  Goal status: ONGOING   3.  Pt will employ dysarthria strategies resulting in 50% intelligibility in self-generated words and phrases with usual mod-A Baseline:  Goal status: ONGOING   4.  Pt will participate in clinical swallow evaluation to assess for candidacy for upgraded diet textures and liquid consistencies PRN Baseline:  Goal status: ONGOING     ASSESSMENT:   CLINICAL IMPRESSION: Patient is a 71 y.o. M who was seen today for cognitive linguistic and dysphagia evaluation following CVA. Pt presenting with severe non-fluent aphasia, moderate dysarthria, oropharyngeal dysphagia, and cognitive deficits in areas of attention, memory, executive functioning.  Significant expressive > receptive language deficits impacting overall communication abilities. Pt spouse reporting usual frustration at home, though are employing yes/no questioning to determine wants/needs. Ongoing education and training to rehabilitate expressive and receptive language. Improving ability to repeat direct models and then subsequently name targeted items with short delay. SLP has engaged pt in variety of tasks to assess for speech generating device candidacy. At this time, R visual inattention primary barrier. Pt with improving object discrimination and ability to ID objects both real and pictured. Appears to be tolerating thin liquids during clincal swallow evaluation. Continues to not be stimulible for dysphagia  exercises or strategies d/t impairment evidenced with following directions. Repeat instrumental swallow evaluation has been ordered to determine if diet upgrade is appropriate at this time. Severity of cognitive impairments continues to be difficult to assess 2/2 communication impairment. Pt benefiting from usual repetition, use of simple and direct language, visual and verbal cueing for participation in assessment. Ongoing education for spouse re: supportive communication strategies. SLP recommends continued skilled ST intervention course to facilitate improved communication efficacy, reduce caregiver burden, and improve QoL.    OBJECTIVE IMPAIRMENTS include attention, memory, executive functioning, aphasia, dysarthria, and dysphagia. These impairments are limiting patient from ADLs/IADLs, effectively communicating at home and in community, and safety when swallowing. Factors affecting potential to achieve goals and functional outcome are ability to learn/carryover information, co-morbidities, previous level of function, and financial resources. Patient will benefit from skilled SLP services to address above impairments and improve overall function.   REHAB POTENTIAL: Fair co-morbidities   PLAN: SLP FREQUENCY: 2x/week   SLP DURATION: 12 weeks   PLANNED INTERVENTIONS: Aspiration precaution training, Pharyngeal strengthening exercises, Diet toleration management , Language facilitation, Trials of upgraded texture/liquids, Cueing hierachy,  Cognitive reorganization, Internal/external aids, Oral motor exercises, Functional tasks, Multimodal communication approach, SLP instruction and feedback, Compensatory strategies, Patient/family education, and Re-evaluation   Su Monks, CCC-SLP 11/16/2022, 1:54 PM

## 2022-11-18 ENCOUNTER — Other Ambulatory Visit: Payer: Self-pay

## 2022-11-18 NOTE — Patient Outreach (Signed)
Telephone outreach to patient, spoke with spouse to obtain mRS was successfully completed. MRS= 3  Vanice Sarah [facility] Care Management Assistant [phone]

## 2022-11-20 ENCOUNTER — Ambulatory Visit: Payer: Medicare HMO | Attending: Student | Admitting: Speech Pathology

## 2022-11-20 DIAGNOSIS — R41841 Cognitive communication deficit: Secondary | ICD-10-CM | POA: Diagnosis present

## 2022-11-20 DIAGNOSIS — R482 Apraxia: Secondary | ICD-10-CM | POA: Diagnosis present

## 2022-11-20 DIAGNOSIS — R1310 Dysphagia, unspecified: Secondary | ICD-10-CM

## 2022-11-20 DIAGNOSIS — R4701 Aphasia: Secondary | ICD-10-CM

## 2022-11-20 NOTE — Therapy (Signed)
OUTPATIENT SPEECH LANGUAGE PATHOLOGY TREATMENT NOTE   Patient Name: Dustin Blanchard MRN: [id number] DOB:[birth year], 71 y.o., male Today's Date: 11/20/2022  PCP: [facility], [place] PROVIDER: Vaughan Basta, Ed[place] Circle, PA-C   END OF SESSION:   End of Session - 11/20/22 1316     Visit Number 19    Number of Visits 25    Date for SLP Re-Evaluation 11/30/22    Authorization Type [employer] Medicare    Progress Note Due on Visit 10    SLP Start Time 1316    SLP Stop Time  1355    SLP Time Calculation (min) 39 min    Activity Tolerance Patient tolerated treatment well                      Past Medical History:  Diagnosis Date   Dementia ([place])    Past Surgical History:  Procedure Laterality Date   HERNIA REPAIR     HIP SURGERY     IR CT HEAD LTD  08/06/2022   IR CT HEAD LTD  08/06/2022   IR PERCUTANEOUS ART THROMBECTOMY/INFUSION INTRACRANIAL INC DIAG ANGIO  08/06/2022   IR US GUIDE VASC ACCESS RIGHT  08/06/2022   RADIOLOGY WITH ANESTHESIA N/A 08/06/2022   Procedure: IR WITH ANESTHESIA;  Surgeon: Luanne Bras, MD;  Location: [place];  Service: Radiology;  Laterality: N/A;   Patient Active Problem List   Diagnosis Date Noted   Pneumonia of right lower lobe due to infectious organism    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 No  OBJECTIVE:   TODAY'S  TREATMENT:  11-20-22: Initiated session with visual scanning task, as this is typically a facilitative strategy to aid in pt ability to scan during subsequent language tasks. Target ID and naming of family members with visual aid provided. Pt able to name 3/12 with max-A. Usual verbal cues to increase volume and over-articulate, pt with mumbling quality, overall unintelligible. Use of modified melodic intonation with familiar songs with pt humming along to x2 songs in entirety, then able to complete phrase completion of lyrics in 7/8 trials. SLP models phrase to greet granddaughter with pt able to produce with mod-I x1 with delay following initial model. Attempt to ID objects with Lingraphica device, set to L visual orientation, with success in 0/8 trials with max-A. Potential difficulty with visual perception of icons, though SLP did increase size. Was not hitting icons when attempting to make incorrect selection.  11-16-22: Target visual scanning and matching like objects with pt unable to process instructions for matching of colored cards with multiple attempts from SLP. Switched task to matching letters, with pt able to match 4/6, with R visual cut impacting success of remaining 2. SLP modified presenation with brightly colored sheet to enable scanning to set end point, with pt able to scan R and find remaining 2 letters for accurate matching. Second  attempt with increased field of 10, pt with 70% accuracy with usual, faded to intermittently, verbal cues for orientation to task and ongoing cues for scanning R and up or down depending on which line pt has chosen to focus on. Pt requires max-A to scan to far edge of visual field defined by brightly colored piece of paper. An AAC device would likely require modification to accommodate R visual cut as little improvement noted in this area. Attempt to ID and name family members. Receptive ID from [place], 2/5 trials. Names 2/2 with max-A and direct model.    PATIENT  EDUCATION: Education details: see above Person educated: Patient and Spouse Education method: Explanation, Demonstration, and Handouts Education comprehension: verbalized understanding, returned demonstration, and needs further education   GOALS: Goals reviewed with patient? Yes   SHORT TERM GOALS: Target date: 10/12/2022   Pt will average 80% accuracy in practical naming tasks with usual mod-A over 2 sessions.  Baseline: 10-06-22, 10-08-22 Goal status: MET   2.  Spouse will teach back supportive communication strategies to aid in auditory comprehension at home with supervision A.   Baseline: 10-12-22 Goal status: MET   3.  Pt will comprehend  written choice f:3 at word level with known context 80% to make accurate selections of preferences, answer questions at home with occasional min A Baseline: 10-08-22 Goal status: MET   4.  Pt will be intelligible in 2 word phrase production in 15/20 trials with direct model and usual mod-A  Baseline: 10-12-22 Goal status: PARTIALLY MET   5.  Pt will complete augmentative communication/SGD canidacy assessment to determine appropriate communication device to support communication of wants/needs/thoughts Baseline: 10-12-22 Goal status: NOT MET, ONGOING     LONG TERM GOALS: Target date: 11/30/2022    Pt will use AAC/multimodal communicate to communicate choices, wants at home with family and caregiver over 1 week Baseline:  Goal status: ONGOING   2.  Pt will name 5 items in personally relevant categories with occasional mod A over 2 sessions Baseline:  Goal status: ONGOING   3.  Pt will employ dysarthria strategies resulting in 50% intelligibility in self-generated words and phrases with usual mod-A Baseline:  Goal status: ONGOING   4.  Pt will participate in clinical swallow evaluation to assess for candidacy for upgraded diet textures and liquid consistencies PRN Baseline:  Goal status: ONGOING     ASSESSMENT:   CLINICAL  IMPRESSION: Patient is a 71 y.o. M who was seen today for cognitive linguistic and dysphagia evaluation following CVA. Pt presenting with severe non-fluent aphasia, moderate dysarthria, oropharyngeal dysphagia, and cognitive deficits in areas of attention, memory, executive functioning.  Significant expressive > receptive language deficits impacting overall communication abilities. Pt spouse reporting usual frustration at home, though are employing yes/no questioning to determine wants/needs. Ongoing education and training to rehabilitate expressive and receptive language. Improving ability to repeat direct models and then subsequently name targeted items with short delay. SLP has engaged pt in variety of tasks to assess for speech generating device candidacy. At this time, R visual inattention primary barrier. Pt with improving object discrimination and ability to ID objects both real and pictured. Appears to be tolerating thin liquids during clincal swallow evaluation. Continues to not be stimulible for dysphagia exercises or strategies d/t impairment evidenced with following directions. Repeat instrumental swallow evaluation has been ordered to determine if diet upgrade is appropriate at this time. Severity of cognitive impairments continues to be difficult to assess 2/2 communication impairment. Pt benefiting  from usual repetition, use of simple and direct language, visual and verbal cueing for participation in assessment. Ongoing education for spouse re: supportive communication strategies. SLP recommends continued skilled ST intervention course to facilitate improved communication efficacy, reduce caregiver burden, and improve QoL.    OBJECTIVE IMPAIRMENTS include attention, memory, executive functioning, aphasia, dysarthria, and dysphagia. These impairments are limiting patient from ADLs/IADLs, effectively communicating at home and in community, and safety when swallowing. Factors affecting potential to  achieve goals and functional outcome are ability to learn/carryover information, co-morbidities, previous level of function, and financial resources. Patient will benefit from skilled SLP services to address above impairments and improve overall function.   REHAB POTENTIAL: Fair co-morbidities   PLAN: SLP FREQUENCY: 2x/week   SLP DURATION: 12 weeks   PLANNED INTERVENTIONS: Aspiration precaution training, Pharyngeal strengthening exercises, Diet toleration management , Language facilitation, Trials of upgraded texture/liquids, Cueing hierachy, Cognitive reorganization, Internal/external aids, Oral motor exercises, Functional tasks, Multimodal communication approach, SLP instruction and feedback, Compensatory strategies, Patient/family education, and Re-evaluation   Su Monks, CCC-SLP 11/20/2022, 1:16 PM

## 2022-11-23 ENCOUNTER — Ambulatory Visit: Payer: Medicare HMO | Admitting: Speech Pathology

## 2022-11-23 DIAGNOSIS — R4701 Aphasia: Secondary | ICD-10-CM

## 2022-11-23 DIAGNOSIS — R482 Apraxia: Secondary | ICD-10-CM | POA: Diagnosis not present

## 2022-11-23 DIAGNOSIS — R41841 Cognitive communication deficit: Secondary | ICD-10-CM

## 2022-11-23 DIAGNOSIS — R1310 Dysphagia, unspecified: Secondary | ICD-10-CM

## 2022-11-23 NOTE — Addendum Note (Signed)
Addended by: Link Snuffer L on: 11/23/2022 04:04 PM   Modules accepted: Orders

## 2022-11-23 NOTE — Therapy (Addendum)
OUTPATIENT SPEECH LANGUAGE PATHOLOGY TREATMENT NOTE   Patient Name: Dustin Blanchard MRN: [id number] DOB:July 24, 1951, 71 y.o., male Today's Date: 11/23/2022  PCP: Inc, [place] PROVIDER: Vaughan Basta, [place] Circle, PA-C   END OF SESSION:   End of Session - 11/23/22 1457     Visit Number 20    Number of Visits 25    Date for SLP Re-Evaluation 11/30/22    Authorization Type [employer]    Progress Note Due on Visit 10    SLP Start Time 1457    SLP Stop Time  1535    SLP Time Calculation (min) 38 min    Activity Tolerance Patient tolerated treatment well                       Past Medical History:  Diagnosis Date   Dementia [facility])    Past Surgical History:  Procedure Laterality Date   HERNIA REPAIR     HIP SURGERY     IR CT HEAD LTD  08/06/2022   IR CT HEAD LTD  08/06/2022   IR PERCUTANEOUS ART THROMBECTOMY/INFUSION INTRACRANIAL INC DIAG ANGIO  08/06/2022   IR US GUIDE VASC ACCESS RIGHT  08/06/2022   RADIOLOGY WITH ANESTHESIA N/A 08/06/2022   Procedure: IR WITH ANESTHESIA;  Surgeon: Luanne Bras, MD;  Location: [place];  Service: Radiology;  Laterality: N/A;   Patient Active Problem List   Diagnosis Date Noted   Pneumonia of right lower lobe due to infectious organism    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   Sinus pause    Acute combined systolic and diastolic heart failure (HCC)    Atrial fibrillation with RVR (HCC)    Acute on chronic combined systolic and diastolic CHF (congestive heart failure) ([place])    Acute ischemic left MCA stroke ([place]) 08/06/2022    ONSET DATE: 08/06/2022    REFERRING DIAG: I63.9 (ICD-10-CM) - CVA (cerebral vascular accident) ([place])   THERAPY DIAG:  Apraxia  Aphasia  Dysphagia, unspecified type  Cognitive communication deficit  Rationale for Evaluation and Treatment Rehabilitation  SUBJECTIVE: Wife reports to working on visual scanning and matching at home, with verbal cueing for scanning  R  PAIN:  Are you having pain? No  Speech Therapy Progress Note  Dates of Reporting Period: 10/14/2022 to 11/23/2022  Objective Reports of Subjective Statement: Pt seen for total of 20 visits targeting aphasia and dysphagia. 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   Objective Measurements: Phrase completion accuracy variable, up to 80% for melodic intonation targets. Able to produce x8 functional phrases using melodic intonation with delayed model, and x2 with mod-I. Receptive ID of objects averages 90% accuracy with extended time and usual repetition. Improving visual scanning evidenced with usual faded to min-A to visually scan R in structured activities.   Goal Update: see below  Plan: continue per POC  Reason Skilled Services are Required: maximize communication efficacy, enhance social connection and QoL, decrease caregiver burden     OBJECTIVE:   TODAY'S TREATMENT:  11-23-22: Given option, pt chooses to initiate session with melodic intonation therapy. SLP played familiar song, with pt humming along to melody throughout. Following, completes 8/12 phrase completion opportunities. Reviewed functional phrases previously targeted. Pt able to say x8 with delayed model, x2 with mod-I. Education on benefit of leveraging this neural network to facilitate communication and encouraged spouse to  incorporate into routine. Demonstration for functional targets to imbed within daily routines. Spouse verbalizes understanding. Given x10 items, pt able to sort 10/10 into pre-defined categories. Names 10/10 with direct model, 2/10 with delayed model, 4/10 with phonemic cueing. Receptive ID of items 4/7 trials.    11-20-22: Initiated session with visual scanning task, as this is typically a facilitative strategy to aid in pt ability to scan during subsequent  language tasks. Target ID and naming of family members with visual aid provided. Pt able to name 3/12 with max-A. Usual verbal cues to increase volume and over-articulate, pt with mumbling quality, overall unintelligible. Use of modified melodic intonation with familiar songs with pt humming along to x2 songs in entirety, then able to complete phrase completion of lyrics in 7/8 trials. SLP models phrase to greet granddaughter with pt able to produce with mod-I x1 with delay following initial model. Attempt to ID objects with Lingraphica device, set to L visual orientation, with success in 0/8 trials with max-A. Potential difficulty with visual perception of icons, though SLP did increase size. Was not hitting icons when attempting to make incorrect selection.  11-16-22: Target visual scanning and matching like objects with pt unable to process instructions for matching of colored cards with multiple attempts from SLP. Switched task to matching letters, with pt able to match 4/6, with R visual cut impacting success of remaining 2. SLP modified presenation with brightly colored sheet to enable scanning to set end point, with pt able to scan R and find remaining 2 letters for accurate matching. Second attempt with increased field of 10, pt with 70% accuracy with usual, faded to intermittently, verbal cues for orientation to task and ongoing cues for scanning R and up or down depending on which line pt has chosen to focus on. Pt requires max-A to scan to far edge of visual field defined by brightly colored piece of paper. An AAC device would likely require modification to accommodate R visual cut as little improvement noted in this area. Attempt to ID and name family members. Receptive ID from [place], 2/5 trials. Names 2/2 with max-A and direct model.    PATIENT EDUCATION: Education details: see above Person educated: Patient and Spouse Education method: Explanation, Demonstration, and Handouts Education  comprehension: verbalized understanding, returned demonstration, and needs further education   GOALS: Goals reviewed with patient? Yes   SHORT TERM GOALS: Target date: 10/12/2022   Pt will average 80% accuracy in practical naming tasks with usual mod-A over 2 sessions.  Baseline: 10-06-22, 10-08-22 Goal status: MET   2.  Spouse will teach back supportive communication strategies to aid in auditory comprehension at home with supervision A.   Baseline: 10-12-22 Goal status: MET   3.  Pt will comprehend  written choice f:3 at word level with known context 80% to make accurate selections of preferences, answer questions at home with occasional min A Baseline: 10-08-22 Goal status: MET   4.  Pt will be intelligible in 2 word phrase production in 15/20 trials with direct model and usual mod-A  Baseline: 10-12-22 Goal status: PARTIALLY MET   5.  Pt will complete augmentative communication/SGD canidacy assessment to determine appropriate communication device to support communication of wants/needs/thoughts Baseline: 10-12-22 Goal status: NOT MET, ONGOING     LONG TERM GOALS: Target date: 12/09/2022    Pt will use AAC/multimodal communicate to communicate choices, wants at home with family and caregiver over 1 week Baseline:  Goal status: ONGOING   2.  Pt  will name 5 items in personally relevant categories with occasional mod A over 2 sessions Baseline:  Goal status: ONGOING   3.  Pt will employ dysarthria strategies resulting in 50% intelligibility in self-generated words and phrases with usual mod-A Baseline:  Goal status: ONGOING   4.  Pt will participate in clinical swallow evaluation to assess for candidacy for upgraded diet textures and liquid consistencies PRN Baseline:  Goal status: ONGOING     ASSESSMENT:   CLINICAL IMPRESSION: Ongoing significant expressive > receptive language deficits impacting overall communication abilities. Ongoing education and training to  rehabilitate expressive and receptive language with pt demonstrating improving ability to repeat direct models and then subsequently name targeted items with short delay. Melodic intonation therapeutic intervention appears motivating for pt and is successful in therapy sessions for improving clarity of speech at phrase level. Pt with improving object discrimination and ability to ID objects both real and pictured with extended time. Have upgraded diet to thin liquids and minced/moist solids without overt difficulties. Severity of cognitive impairments continues to be difficult to assess 2/2 communication impairment. Pt benefiting from usual repetition, use of simple and direct language, visual and verbal cueing for participation in assessment. SLP recommends continued skilled ST intervention course to facilitate improved communication efficacy, reduce caregiver burden, and improve QoL.    OBJECTIVE IMPAIRMENTS include attention, memory, executive functioning, aphasia, dysarthria, and dysphagia. These impairments are limiting patient from ADLs/IADLs, effectively communicating at home and in community, and safety when swallowing. Factors affecting potential to achieve goals and functional outcome are ability to learn/carryover information, co-morbidities, previous level of function, and financial resources. Patient will benefit from skilled SLP services to address above impairments and improve overall function.   REHAB POTENTIAL: Fair co-morbidities   PLAN: SLP FREQUENCY: 2x/week   SLP DURATION: 12 weeks   PLANNED INTERVENTIONS: Aspiration precaution training, Pharyngeal strengthening exercises, Diet toleration management , Language facilitation, Trials of upgraded texture/liquids, Cueing hierachy, Cognitive reorganization, Internal/external aids, Oral motor exercises, Functional tasks, Multimodal communication approach, SLP instruction and feedback, Compensatory strategies, Patient/family education, and  Re-evaluation   Su Monks, CCC-SLP 11/23/2022, 4:03 PM

## 2022-11-25 ENCOUNTER — Ambulatory Visit: Payer: Medicare HMO | Admitting: Speech Pathology

## 2022-11-25 DIAGNOSIS — R4701 Aphasia: Secondary | ICD-10-CM

## 2022-11-25 DIAGNOSIS — R1310 Dysphagia, unspecified: Secondary | ICD-10-CM

## 2022-11-25 DIAGNOSIS — R482 Apraxia: Secondary | ICD-10-CM

## 2022-11-25 DIAGNOSIS — R41841 Cognitive communication deficit: Secondary | ICD-10-CM

## 2022-11-25 NOTE — Therapy (Signed)
OUTPATIENT SPEECH LANGUAGE PATHOLOGY TREATMENT NOTE   Patient Name: Dustin Blanchard MRN: [id number] DOB:1951-10-01, 71 y.o., male Today's Date: 11/25/2022  PCP: Inc, [place] PROVIDER: Vaughan Basta, Ed[place] Circle, PA-C   END OF SESSION:   End of Session - 11/25/22 1258     Visit Number 21    Number of Visits 25    Date for SLP Re-Evaluation 12/09/22   to accomodate for 25 visits   Authorization Type [employer] Medicare    Progress Note Due on Visit 10    SLP Start Time 1315    SLP Stop Time  1400    SLP Time Calculation (min) 45 min    Activity Tolerance Patient tolerated treatment well                       Past Medical History:  Diagnosis Date   Dementia [facility])    Past Surgical History:  Procedure Laterality Date   HERNIA REPAIR     HIP SURGERY     IR CT HEAD LTD  08/06/2022   IR CT HEAD LTD  08/06/2022   IR PERCUTANEOUS ART THROMBECTOMY/INFUSION INTRACRANIAL INC DIAG ANGIO  08/06/2022   IR US GUIDE VASC ACCESS RIGHT  08/06/2022   RADIOLOGY WITH ANESTHESIA N/A 08/06/2022   Procedure: IR WITH ANESTHESIA;  Surgeon: Luanne Bras, MD;  Location: [place];  Service: Radiology;  Laterality: N/A;   Patient Active Problem List   Diagnosis Date Noted   Pneumonia of right lower lobe due to infectious organism    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   Sinus pause    Acute combined systolic and diastolic heart failure (HCC)    Atrial fibrillation with RVR (HCC)    Acute on chronic combined systolic and diastolic CHF (congestive heart failure) ([place])    Acute ischemic left MCA stroke ([place]) 08/06/2022    ONSET DATE: 08/06/2022    REFERRING DIAG: I63.9 (ICD-10-CM) - CVA (cerebral vascular accident) ([place])   THERAPY DIAG:  Apraxia  Dysphagia, unspecified type  Aphasia  Cognitive communication deficit  Rationale for Evaluation and Treatment Rehabilitation  SUBJECTIVE: Pt had baked beans with hamburger for lunch, was able to chew and  swallow  PAIN:  Are you having pain? No  OBJECTIVE:   TODAY'S TREATMENT:  11-25-22: Wife voices concern of how [name] would call for help should something happen to her. SLP provided some suggestions, with most highly recommended suggestion being implementation of life alert help button with verbal instruction (repeated) on when and how to use. Education on this being facilitative d/t not requiring expressive language skills and low cognitive burden to employ. SLP advised on plan to d/c following scheduled visits d/t maximized rehab potential and lack of progress demonstrated during structured or low pressure conversational opportunities. Asked spouse to think about any especially problematic areas SLP could A in problem solving prior to d/c. Education on dementia likely with affect on overall progress. SLP led pt through language task, ID odd object out with pt successful in 2/5 trials. With questioning cues, increases accuracy to 4/5. Names x1 function.   11-23-22: Given option, pt chooses to initiate session with melodic intonation therapy. SLP played familiar song, with pt humming along to melody throughout. Following, completes 8/12 phrase completion opportunities. Reviewed functional phrases previously targeted. Pt able to say x8 with delayed model, x2 with mod-I. Education on benefit of leveraging this neural network to facilitate communication and encouraged spouse to incorporate into routine.  Demonstration for functional targets to imbed within daily routines. Spouse verbalizes understanding. Given x10 items, pt able to sort 10/10 into pre-defined categories. Names 10/10 with direct model, 2/10 with delayed model, 4/10 with phonemic cueing. Receptive ID of items 4/7 trials.    11-20-22: Initiated session with visual scanning task, as this is typically a facilitative strategy to aid in pt ability to scan during subsequent language tasks. Target ID and naming of family members with visual aid  provided. Pt able to name 3/12 with max-A. Usual verbal cues to increase volume and over-articulate, pt with mumbling quality, overall unintelligible. Use of modified melodic intonation with familiar songs with pt humming along to x2 songs in entirety, then able to complete phrase completion of lyrics in 7/8 trials. SLP models phrase to greet granddaughter with pt able to produce with mod-I x1 with delay following initial model. Attempt to ID objects with Lingraphica device, set to L visual orientation, with success in 0/8 trials with max-A. Potential difficulty with visual perception of icons, though SLP did increase size. Was not hitting icons when attempting to make incorrect selection.  11-16-22: Target visual scanning and matching like objects with pt unable to process instructions for matching of colored cards with multiple attempts from SLP. Switched task to matching letters, with pt able to match 4/6, with R visual cut impacting success of remaining 2. SLP modified presenation with brightly colored sheet to enable scanning to set end point, with pt able to scan R and find remaining 2 letters for accurate matching. Second attempt with increased field of 10, pt with 70% accuracy with usual, faded to intermittently, verbal cues for orientation to task and ongoing cues for scanning R and up or down depending on which line pt has chosen to focus on. Pt requires max-A to scan to far edge of visual field defined by brightly colored piece of paper. An AAC device would likely require modification to accommodate R visual cut as little improvement noted in this area. Attempt to ID and name family members. Receptive ID from [place], 2/5 trials. Names 2/2 with max-A and direct model.    PATIENT EDUCATION: Education details: see above Person educated: Patient and Spouse Education method: Explanation, Demonstration, and Handouts Education comprehension: verbalized understanding, returned demonstration, and needs  further education   GOALS: Goals reviewed with patient? Yes   SHORT TERM GOALS: Target date: 10/12/2022   Pt will average 80% accuracy in practical naming tasks with usual mod-A over 2 sessions.  Baseline: 10-06-22, 10-08-22 Goal status: MET   2.  Spouse will teach back supportive communication strategies to aid in auditory comprehension at home with supervision A.   Baseline: 10-12-22 Goal status: MET   3.  Pt will comprehend  written choice f:3 at word level with known context 80% to make accurate selections of preferences, answer questions at home with occasional min A Baseline: 10-08-22 Goal status: MET   4.  Pt will be intelligible in 2 word phrase production in 15/20 trials with direct model and usual mod-A  Baseline: 10-12-22 Goal status: PARTIALLY MET   5.  Pt will complete augmentative communication/SGD canidacy assessment to determine appropriate communication device to support communication of wants/needs/thoughts Baseline: 10-12-22 Goal status: NOT MET, ONGOING     LONG TERM GOALS: Target date: 12/09/2022    Pt will use AAC/multimodal communicate to communicate choices, wants at home with family and caregiver over 1 week Baseline:  Goal status: ONGOING   2.  Pt will name 5  items in personally relevant categories with occasional mod A over 2 sessions Baseline:  Goal status: ONGOING   3.  Pt will employ dysarthria strategies resulting in 50% intelligibility in self-generated words and phrases with usual mod-A Baseline:  Goal status: ONGOING   4.  Pt will participate in clinical swallow evaluation to assess for candidacy for upgraded diet textures and liquid consistencies PRN Baseline:  Goal status: MET     ASSESSMENT:   CLINICAL IMPRESSION: Ongoing significant expressive > receptive language deficits impacting overall communication abilities. Ongoing education and training to rehabilitate expressive and receptive language with pt demonstrating improving  ability to repeat direct models and then subsequently name targeted items with short delay. Melodic intonation therapeutic intervention appears motivating for pt and is successful in therapy sessions for improving clarity of speech at phrase level. Pt with improving object discrimination and ability to ID objects both real and pictured with extended time. Have upgraded diet to thin liquids and minced/moist solids without overt difficulties. Severity of cognitive impairments continues to be difficult to assess 2/2 communication impairment. Pt benefiting from usual repetition, use of simple and direct language, visual and verbal cueing for participation in assessment. SLP recommends continued skilled ST intervention course to facilitate improved communication efficacy, reduce caregiver burden, and improve QoL.    OBJECTIVE IMPAIRMENTS include attention, memory, executive functioning, aphasia, dysarthria, and dysphagia. These impairments are limiting patient from ADLs/IADLs, effectively communicating at home and in community, and safety when swallowing. Factors affecting potential to achieve goals and functional outcome are ability to learn/carryover information, co-morbidities, previous level of function, and financial resources. Patient will benefit from skilled SLP services to address above impairments and improve overall function.   REHAB POTENTIAL: Fair co-morbidities   PLAN: SLP FREQUENCY: 2x/week   SLP DURATION: 12 weeks   PLANNED INTERVENTIONS: Aspiration precaution training, Pharyngeal strengthening exercises, Diet toleration management , Language facilitation, Trials of upgraded texture/liquids, Cueing hierachy, Cognitive reorganization, Internal/external aids, Oral motor exercises, Functional tasks, Multimodal communication approach, SLP instruction and feedback, Compensatory strategies, Patient/family education, and Re-evaluation   Su Monks, CCC-SLP 11/25/2022, 1:16 PM

## 2022-11-27 NOTE — Therapy (Unsigned)
OUTPATIENT SPEECH LANGUAGE PATHOLOGY TREATMENT NOTE   Patient Name: Dustin Blanchard MRN: [id number] DOB:05/03/1951, 71 y.o., male Today's Date: 11/27/2022  PCP: [facility], [place] PROVIDER: Vaughan Basta, [address] Circle, PA-C   END OF SESSION:               Past Medical History:  Diagnosis Date   Dementia [facility])    Past Surgical History:  Procedure Laterality Date   HERNIA REPAIR     HIP SURGERY     IR CT HEAD LTD  08/06/2022   IR CT HEAD LTD  08/06/2022   IR PERCUTANEOUS ART THROMBECTOMY/INFUSION INTRACRANIAL INC DIAG ANGIO  08/06/2022   IR US GUIDE VASC ACCESS RIGHT  08/06/2022   RADIOLOGY WITH ANESTHESIA N/A 08/06/2022   Procedure: IR WITH ANESTHESIA;  Surgeon: Luanne Bras, MD;  Location: [place];  Service: Radiology;  Laterality: N/A;   Patient Active Problem List   Diagnosis Date Noted   Pneumonia of right lower lobe due to infectious organism    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  Rationale for Evaluation and Treatment Rehabilitation  SUBJECTIVE: ***  PAIN:  Are you having pain? No  OBJECTIVE:   TODAY'S TREATMENT:  11-30-22: ***  11-25-22: Wife voices concern of how [name] would call for help should something happen to her. SLP provided some suggestions, with most highly recommended suggestion being implementation of life alert help button with verbal instruction (repeated) on when and how to use. Education on this being facilitative d/t not requiring expressive language skills and low cognitive burden to employ. SLP advised on plan to  d/c following scheduled visits d/t maximized rehab potential and lack of progress demonstrated during structured or low pressure conversational opportunities. Asked spouse to think about any especially problematic areas SLP could A in problem solving prior to d/c. Education on dementia likely with affect on overall progress. SLP led pt through language task, ID odd object out with pt successful in 2/5 trials. With questioning cues, increases accuracy to 4/5. Names x1 function.   11-23-22: Given option, pt chooses to initiate session with melodic intonation therapy. SLP played familiar song, with pt humming along to melody throughout. Following, completes 8/12 phrase completion opportunities. Reviewed functional phrases previously targeted. Pt able to say x8 with delayed model, x2 with mod-I. Education on benefit of leveraging this neural network to facilitate communication and encouraged spouse to incorporate into routine. Demonstration for functional targets to imbed within daily routines. Spouse verbalizes understanding. Given x10 items, pt able to sort 10/10 into pre-defined categories. Names 10/10 with direct model, 2/10 with delayed model, 4/10 with phonemic cueing. Receptive ID of items 4/7 trials.    11-20-22: Initiated session with visual scanning task, as this is typically a facilitative strategy to aid in pt ability to scan during subsequent language tasks. Target ID and naming of family members with visual aid provided. Pt able to name 3/12 with max-A. Usual verbal cues to increase volume and over-articulate, pt with mumbling quality, overall unintelligible. Use of modified melodic intonation with familiar songs with  pt humming along to x2 songs in entirety, then able to complete phrase completion of lyrics in 7/8 trials. SLP models phrase to greet granddaughter with pt able to produce with mod-I x1 with delay following initial model. Attempt to ID objects with Lingraphica device, set to L visual  orientation, with success in 0/8 trials with max-A. Potential difficulty with visual perception of icons, though SLP did increase size. Was not hitting icons when attempting to make incorrect selection.  11-16-22: Target visual scanning and matching like objects with pt unable to process instructions for matching of colored cards with multiple attempts from SLP. Switched task to matching letters, with pt able to match 4/6, with R visual cut impacting success of remaining 2. SLP modified presenation with brightly colored sheet to enable scanning to set end point, with pt able to scan R and find remaining 2 letters for accurate matching. Second attempt with increased field of 10, pt with 70% accuracy with usual, faded to intermittently, verbal cues for orientation to task and ongoing cues for scanning R and up or down depending on which line pt has chosen to focus on. Pt requires max-A to scan to far edge of visual field defined by brightly colored piece of paper. An AAC device would likely require modification to accommodate R visual cut as little improvement noted in this area. Attempt to ID and name family members. Receptive ID from [place], 2/5 trials. Names 2/2 with max-A and direct model.    PATIENT EDUCATION: Education details: see above Person educated: Patient and Spouse Education method: Explanation, Demonstration, and Handouts Education comprehension: verbalized understanding, returned demonstration, and needs further education   GOALS: Goals reviewed with patient? Yes   SHORT TERM GOALS: Target date: 10/12/2022   Pt will average 80% accuracy in practical naming tasks with usual mod-A over 2 sessions.  Baseline: 10-06-22, 10-08-22 Goal status: MET   2.  Spouse will teach back supportive communication strategies to aid in auditory comprehension at home with supervision A.   Baseline: 10-12-22 Goal status: MET   3.  Pt will comprehend  written choice f:3 at word level with known context 80%  to make accurate selections of preferences, answer questions at home with occasional min A Baseline: 10-08-22 Goal status: MET   4.  Pt will be intelligible in 2 word phrase production in 15/20 trials with direct model and usual mod-A  Baseline: 10-12-22 Goal status: PARTIALLY MET   5.  Pt will complete augmentative communication/SGD canidacy assessment to determine appropriate communication device to support communication of wants/needs/thoughts Baseline: 10-12-22 Goal status: NOT MET, ONGOING     LONG TERM GOALS: Target date: 12/09/2022    Pt will use AAC/multimodal communicate to communicate choices, wants at home with family and caregiver over 1 week Baseline:  Goal status: ONGOING   2.  Pt will name 5 items in personally relevant categories with occasional mod A over 2 sessions Baseline:  Goal status: ONGOING   3.  Pt will employ dysarthria strategies resulting in 50% intelligibility in self-generated words and phrases with usual mod-A Baseline:  Goal status: ONGOING   4.  Pt will participate in clinical swallow evaluation to assess for candidacy for upgraded diet textures and liquid consistencies PRN Baseline:  Goal status: MET     ASSESSMENT:   CLINICAL IMPRESSION: Ongoing significant expressive > receptive language deficits impacting overall communication abilities. Ongoing education and training to rehabilitate expressive and receptive language with pt demonstrating improving ability to repeat direct models and  then subsequently name targeted items with short delay. Melodic intonation therapeutic intervention appears motivating for pt and is successful in therapy sessions for improving clarity of speech at phrase level. Pt with improving object discrimination and ability to ID objects both real and pictured with extended time. Have upgraded diet to thin liquids and minced/moist solids without overt difficulties. Severity of cognitive impairments continues to be difficult  to assess 2/2 communication impairment. Pt benefiting from usual repetition, use of simple and direct language, visual and verbal cueing for participation in assessment. SLP recommends continued skilled ST intervention course to facilitate improved communication efficacy, reduce caregiver burden, and improve QoL.    OBJECTIVE IMPAIRMENTS include attention, memory, executive functioning, aphasia, dysarthria, and dysphagia. These impairments are limiting patient from ADLs/IADLs, effectively communicating at home and in community, and safety when swallowing. Factors affecting potential to achieve goals and functional outcome are ability to learn/carryover information, co-morbidities, previous level of function, and financial resources. Patient will benefit from skilled SLP services to address above impairments and improve overall function.   REHAB POTENTIAL: Fair co-morbidities   PLAN: SLP FREQUENCY: 2x/week   SLP DURATION: 12 weeks   PLANNED INTERVENTIONS: Aspiration precaution training, Pharyngeal strengthening exercises, Diet toleration management , Language facilitation, Trials of upgraded texture/liquids, Cueing hierachy, Cognitive reorganization, Internal/external aids, Oral motor exercises, Functional tasks, Multimodal communication approach, SLP instruction and feedback, Compensatory strategies, Patient/family education, and Re-evaluation   Su Monks, CCC-SLP 11/27/2022, 10:25 AM

## 2022-11-30 ENCOUNTER — Ambulatory Visit: Payer: Medicare HMO | Admitting: Speech Pathology

## 2022-11-30 DIAGNOSIS — R1310 Dysphagia, unspecified: Secondary | ICD-10-CM

## 2022-11-30 DIAGNOSIS — R41841 Cognitive communication deficit: Secondary | ICD-10-CM

## 2022-11-30 DIAGNOSIS — R482 Apraxia: Secondary | ICD-10-CM | POA: Diagnosis not present

## 2022-11-30 DIAGNOSIS — R4701 Aphasia: Secondary | ICD-10-CM

## 2022-12-02 ENCOUNTER — Ambulatory Visit: Payer: Medicare HMO | Admitting: Speech Pathology

## 2022-12-02 DIAGNOSIS — R41841 Cognitive communication deficit: Secondary | ICD-10-CM

## 2022-12-02 DIAGNOSIS — R482 Apraxia: Secondary | ICD-10-CM | POA: Diagnosis not present

## 2022-12-02 DIAGNOSIS — R4701 Aphasia: Secondary | ICD-10-CM

## 2022-12-02 DIAGNOSIS — R1310 Dysphagia, unspecified: Secondary | ICD-10-CM

## 2022-12-02 NOTE — Therapy (Signed)
OUTPATIENT SPEECH LANGUAGE PATHOLOGY TREATMENT NOTE   Patient Name: Dustin Blanchard MRN: [id number] DOB:[birth year], 71 y.o., male Today's Date: 12/02/2022  PCP: [facility], [place] PROVIDER: Vaughan Basta, Edman Circle, PA-C   END OF SESSION:   End of Session - 12/02/22 1359     Visit Number 23    Number of Visits 25    Date for SLP Re-Evaluation 12/09/22   to accomodate for 25 visits   Authorization Type [employer]    Progress Note Due on Visit 10    SLP Start Time 1315    SLP Stop Time  1400    SLP Time Calculation (min) 45 min    Activity Tolerance Patient tolerated treatment well                Past Medical History:  Diagnosis Date   Dementia [facility])    Past Surgical History:  Procedure Laterality Date   HERNIA REPAIR     HIP SURGERY     IR CT HEAD LTD  08/06/2022   IR CT HEAD LTD  08/06/2022   IR PERCUTANEOUS ART THROMBECTOMY/INFUSION INTRACRANIAL INC DIAG ANGIO  08/06/2022   IR US GUIDE VASC ACCESS RIGHT  08/06/2022   RADIOLOGY WITH ANESTHESIA N/A 08/06/2022   Procedure: IR WITH ANESTHESIA;  Surgeon: Luanne Bras, MD;  Location: [place];  Service: Radiology;  Laterality: N/A;   Patient Active Problem List   Diagnosis Date Noted   Pneumonia of right lower lobe due to infectious organism    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)    Acute ischemic left MCA stroke ([place]) 08/06/2022    ONSET DATE: 08/06/2022    REFERRING DIAG: I63.9 (ICD-10-CM) - CVA (cerebral vascular accident) ([place])   THERAPY DIAG:  Apraxia  Dysphagia, unspecified type  Aphasia  Cognitive communication deficit  Rationale for Evaluation and Treatment Rehabilitation  SUBJECTIVE: "working on looking R"  PAIN:  Are you having pain? No  OBJECTIVE:    TODAY'S TREATMENT:  12-02-22: Target visual scanning and object ID during matching activity with photo cards. 3/5 and 3/5 accuracy with usual mod-A verbal cues. ID object with semantic cues: 8/10 objects. Given complete semantic description and phonemic cue, pt names 3/5 objects. Additional 2 required direct model. Following, pt able to name 0/5 given opportunity to recall objects he just names. Pt evidences frustration with task. Conclude session with modified melodic intonation therapy with use of familiar song. Following listening 1x through with pt singing along, pt able to successfully complete 8/9 lyric completions. Spouse questions if pt's language will continue to improve. SLP provides information on progress demonstrated thus far and information to A in carryover of language rich activities to facilitate ongoing progress. Unable to make determination re: how much progress cna be expected 2/2 severity of impairment and underlying dementia at baseline.   11-30-22: Initate session with visual scanning task, with SLP having to branch down task to aid in pt's ability to complete task this date. Faciliate context this date was matching photos without text, with pt achieving 5/5 accuracy with mod-A for visual scanning. Following pt able to ID objects verbalized in 4/6 trials with pt providing repetitions to self. SLP increased complexity to describing objects with semantic cues, pt ID 1/4 objects from [place],  2/4 from [place]. SLP questions spouse if any communication needs not being met at home for pt's safety, comfort, wants, needs, she denies. Does tell SLP pt is unable to tell stories.   11-25-22: Wife voices concern of how [name] would call for help should something happen to her. SLP provided some suggestions, with most highly recommended suggestion being implementation of life alert help button with verbal instruction (repeated) on when and how to use. Education on this being facilitative d/t not requiring  expressive language skills and low cognitive burden to employ. SLP advised on plan to d/c following scheduled visits d/t maximized rehab potential and lack of progress demonstrated during structured or low pressure conversational opportunities. Asked spouse to think about any especially problematic areas SLP could A in problem solving prior to d/c. Education on dementia likely with affect on overall progress. SLP led pt through language task, ID odd object out with pt successful in 2/5 trials. With questioning cues, increases accuracy to 4/5. Names x1 function.   11-23-22: Given option, pt chooses to initiate session with melodic intonation therapy. SLP played familiar song, with pt humming along to melody throughout. Following, completes 8/12 phrase completion opportunities. Reviewed functional phrases previously targeted. Pt able to say x8 with delayed model, x2 with mod-I. Education on benefit of leveraging this neural network to facilitate communication and encouraged spouse to incorporate into routine. Demonstration for functional targets to imbed within daily routines. Spouse verbalizes understanding. Given x10 items, pt able to sort 10/10 into pre-defined categories. Names 10/10 with direct model, 2/10 with delayed model, 4/10 with phonemic cueing. Receptive ID of items 4/7 trials.    11-20-22: Initiated session with visual scanning task, as this is typically a facilitative strategy to aid in pt ability to scan during subsequent language tasks. Target ID and naming of family members with visual aid provided. Pt able to name 3/12 with max-A. Usual verbal cues to increase volume and over-articulate, pt with mumbling quality, overall unintelligible. Use of modified melodic intonation with familiar songs with pt humming along to x2 songs in entirety, then able to complete phrase completion of lyrics in 7/8 trials. SLP models phrase to greet granddaughter with pt able to produce with mod-I x1 with delay  following initial model. Attempt to ID objects with Lingraphica device, set to L visual orientation, with success in 0/8 trials with max-A. Potential difficulty with visual perception of icons, though SLP did increase size. Was not hitting icons when attempting to make incorrect selection.  11-16-22: Target visual scanning and matching like objects with pt unable to process instructions for matching of colored cards with multiple attempts from SLP. Switched task to matching letters, with pt able to match 4/6, with R visual cut impacting success of remaining 2. SLP modified presenation with brightly colored sheet to enable scanning to set end point, with pt able to scan R and find remaining 2 letters for accurate matching. Second attempt with increased field of 10, pt with 70% accuracy with usual, faded to intermittently, verbal cues for orientation to task and ongoing cues for scanning R and up or down depending on which line pt has chosen to focus on. Pt requires max-A to scan to far edge of visual field defined by brightly colored piece of paper. An AAC device would likely require modification to accommodate R visual cut as little improvement noted in this area. Attempt to ID and name family members. Receptive ID from [place], 2/5 trials. Names 2/2 with max-A and direct model.  PATIENT EDUCATION: Education details: see above Person educated: Patient and Spouse Education method: Explanation, Demonstration, and Handouts Education comprehension: verbalized understanding, returned demonstration, and needs further education   GOALS: Goals reviewed with patient? Yes   SHORT TERM GOALS: Target date: 10/12/2022   Pt will average 80% accuracy in practical naming tasks with usual mod-A over 2 sessions.  Baseline: 10-06-22, 10-08-22 Goal status: MET   2.  Spouse will teach back supportive communication strategies to aid in auditory comprehension at home with supervision A.   Baseline: 10-12-22 Goal status:  MET   3.  Pt will comprehend  written choice f:3 at word level with known context 80% to make accurate selections of preferences, answer questions at home with occasional min A Baseline: 10-08-22 Goal status: MET   4.  Pt will be intelligible in 2 word phrase production in 15/20 trials with direct model and usual mod-A  Baseline: 10-12-22 Goal status: PARTIALLY MET   5.  Pt will complete augmentative communication/SGD canidacy assessment to determine appropriate communication device to support communication of wants/needs/thoughts Baseline: 10-12-22 Goal status: NOT MET, ONGOING     LONG TERM GOALS: Target date: 12/09/2022    Pt will use AAC/multimodal communicate to communicate choices, wants at home with family and caregiver over 1 week Baseline:  Goal status: ONGOING   2.  Pt will name 5 items in personally relevant categories with occasional mod A over 2 sessions Baseline:  Goal status: ONGOING   3.  Pt will employ dysarthria strategies resulting in 50% intelligibility in self-generated words and phrases with usual mod-A Baseline:  Goal status: ONGOING   4.  Pt will participate in clinical swallow evaluation to assess for candidacy for upgraded diet textures and liquid consistencies PRN Baseline:  Goal status: MET     ASSESSMENT:   CLINICAL IMPRESSION: Ongoing significant expressive > receptive language deficits impacting overall communication abilities. Ongoing education and training to rehabilitate expressive and receptive language with pt demonstrating improving ability to repeat direct models and then subsequently name targeted items with short delay. Melodic intonation therapeutic intervention appears motivating for pt and is successful in therapy sessions for improving clarity of speech at phrase level. Pt with improving object discrimination and ability to ID objects both real and pictured with extended time. Have upgraded diet to thin liquids and minced/moist solids  without overt difficulties. Severity of cognitive impairments continues to be difficult to assess 2/2 communication impairment. Pt benefiting from usual repetition, use of simple and direct language, visual and verbal cueing for participation in assessment. SLP recommends continued skilled ST intervention course to facilitate improved communication efficacy, reduce caregiver burden, and improve QoL.    OBJECTIVE IMPAIRMENTS include attention, memory, executive functioning, aphasia, dysarthria, and dysphagia. These impairments are limiting patient from ADLs/IADLs, effectively communicating at home and in community, and safety when swallowing. Factors affecting potential to achieve goals and functional outcome are ability to learn/carryover information, co-morbidities, previous level of function, and financial resources. Patient will benefit from skilled SLP services to address above impairments and improve overall function.   REHAB POTENTIAL: Fair (co-morbidities)   PLAN: SLP FREQUENCY: 2x/week   SLP DURATION: 12 weeks   PLANNED INTERVENTIONS: Aspiration precaution training, Pharyngeal strengthening exercises, Diet toleration management , Language facilitation, Trials of upgraded texture/liquids, Cueing hierachy, Cognitive reorganization, Internal/external aids, Oral motor exercises, Functional tasks, Multimodal communication approach, SLP instruction and feedback, Compensatory strategies, Patient/family education, and Re-evaluation   Su Monks, CCC-SLP 12/02/2022, 2:00 PM

## 2022-12-06 NOTE — Progress Notes (Signed)
Cardiology Clinic Note   Patient Name: Dustin Blanchard Date of Encounter: 12/08/2022  Primary Care Provider:  [facility] Primary Cardiologist:  Little Ishikawa, MD  Patient Profile    Dustin Blanchard 71 year old male presents to the clinic today for follow-up evaluation of his atrial fibrillation and acute on chronic combined systolic and diastolic CHF.  Past Medical History    Past Medical History:  Diagnosis Date   Dementia [facility])    Past Surgical History:  Procedure Laterality Date   HERNIA REPAIR     HIP SURGERY     IR CT HEAD LTD  08/06/2022   IR CT HEAD LTD  08/06/2022   IR PERCUTANEOUS ART THROMBECTOMY/INFUSION INTRACRANIAL INC DIAG ANGIO  08/06/2022   IR US GUIDE VASC ACCESS RIGHT  08/06/2022   RADIOLOGY WITH ANESTHESIA N/A 08/06/2022   Procedure: IR WITH ANESTHESIA;  Surgeon: Julieanne Cotton, MD;  Location: [facility] OR;  Service: Radiology;  Laterality: N/A;    Allergies  No Known Allergies  History of Present Illness    Dustin Blanchard has a PMH of CVA, sinus pause, acute combined systolic and diastolic CHF, atrial fibrillation, and pneumonia.  He was admitted to the hospital on 08/12/2022 and discharged on 08/25/2022.  He presented with acute right-sided weakness and aphasia.  Head CT showed small left PCA infarct of the left occipital lobe.  His CT angiogram head and neck were positive for left MCA territoryoligemia.  Cardiology was consulted for new onset atrial fibrillation with RVR and CHF.  His echocardiogram showed an LVEF of 35-40% and no thrombus.  He was initially placed on diltiazem drip and was transitioned to amiodarone.  Plans were made for outpatient ischemic evaluation after recovery from CVA.  He was started on apixaban.  He was placed on alternate nutritional support.  He was noted to have episodes of sundowning and noted to have a history of vascular dementia.  He was noted to have decreased functional mobility and admitted for  comprehensive rehab.  He presented to the clinic 09/10/22 for follow-up evaluation states he was doing okay.  He was doing pt 2 times per week.  He presented with his wife.  We reviewed his most recent hospitalization and plan for treatment.  They expressed understanding.  He had some lower extremity weakness.  He also had some pressured speech.  I  refilled his cardiac medications, ordered a BMP, CBC, had him increase his physical activity as tolerated/continue physical therapy and planned follow-up in 3 to 4 months.  Lab work showed slightly elevated potassium at 5.3 but otherwise stable.  Repeat lab work on 09/21/2022 showed normal potassium and renal function  He presents to the clinic today for follow-up evaluation and states he continues to work with physical therapy exercises and speech therapy.  He presents with his wife.  She notes that he is continuing to slowly progress.  He is returning to some of his normal daily activities.  He has graduated from physical therapy but is continuing to do exercises at home.  He continues to work with speech therapy.  We reviewed his echocardiogram and lab work from his previous visit.  I will repeat his echocardiogram, continue his current medication, have him continue to increase his physical activity as tolerated and plan follow-up in 6 months.  Today he denies chest pain, shortness of breath, lower extremity edema, fatigue, palpitations, melena, hematuria, hemoptysis, diaphoresis, weakness, presyncope, syncope, orthopnea, and PND.  Home Medications    Prior  to Admission medications   Medication Sig Start Date End Date Taking? Authorizing Provider  acetaminophen (TYLENOL) 325 MG tablet Take 2 tablets (650 mg total) by mouth every 4 (four) hours as needed for mild pain (or temp > 37.5 C (99.5 F)). 08/25/22   Angiulli, Mcarthur Rossetti, PA-C  amiodarone (PACERONE) 200 MG tablet Take 1 tablet (200 mg total) by mouth daily. 08/25/22   Angiulli, Mcarthur Rossetti, PA-C  apixaban  (ELIQUIS) 5 MG TABS tablet Take 1 tablet (5 mg total) by mouth 2 (two) times daily. 08/25/22   Angiulli, Mcarthur Rossetti, PA-C  atorvastatin (LIPITOR) 40 MG tablet Take 1 tablet (40 mg total) by mouth daily. 08/25/22   Angiulli, Mcarthur Rossetti, PA-C  B Complex-C (B-COMPLEX WITH VITAMIN C) tablet Take 1 tablet by mouth daily. 08/25/22   Angiulli, Mcarthur Rossetti, PA-C  cholecalciferol (VITAMIN D3) 10 MCG (400 UNIT) TABS tablet Take 2 tablets (800 Units total) by mouth daily. 08/25/22   Angiulli, Mcarthur Rossetti, PA-C  magnesium gluconate (MAGONATE) 500 MG tablet Take 0.5 tablets (250 mg total) by mouth at bedtime. 08/25/22   Angiulli, Mcarthur Rossetti, PA-C  melatonin 3 MG TABS tablet Take 1 tablet (3 mg total) by mouth at bedtime. 08/25/22   Angiulli, Mcarthur Rossetti, PA-C  mirtazapine (REMERON) 7.5 MG tablet Take 1 tablet (7.5 mg total) by mouth at bedtime. 08/25/22   Angiulli, Mcarthur Rossetti, PA-C  Multiple Vitamin (MULTIVITAMIN WITH MINERALS) TABS tablet Take 1 tablet by mouth daily. 08/25/22   Angiulli, Mcarthur Rossetti, PA-C  pantoprazole sodium (PROTONIX) 40 mg Take 40 mg by mouth at bedtime. 08/25/22   Angiulli, Mcarthur Rossetti, PA-C  polyethylene glycol (MIRALAX / GLYCOLAX) 17 g packet Take 17 g by mouth daily. 08/25/22   Angiulli, Mcarthur Rossetti, PA-C  QUEtiapine (SEROQUEL) 25 MG tablet Take 1 tablet (25 mg total) by mouth daily with supper. 08/25/22   Angiulli, Mcarthur Rossetti, PA-C  spironolactone (ALDACTONE) 25 MG tablet Take 1 tablet (25 mg total) by mouth daily. 08/25/22   Angiulli, Mcarthur Rossetti, PA-C  tamsulosin (FLOMAX) 0.4 MG CAPS capsule Take 1 capsule (0.4 mg total) by mouth daily. 08/25/22   Angiulli, Mcarthur Rossetti, PA-C  valproic acid (DEPAKENE) 250 MG/5ML solution Take 5 mLs (250 mg total) by mouth at bedtime. 08/25/22   Angiulli, Mcarthur Rossetti, PA-C    Family History    History reviewed. No pertinent family history. He indicated that his mother is deceased. He indicated that his father is deceased.  Social History    Social History   Socioeconomic History   Marital status: Married     Spouse name: Not on file   Number of children: Not on file   Years of education: Not on file   Highest education level: Not on file  Occupational History   Not on file  Tobacco Use   Smoking status: Former   Smokeless tobacco: Never  Vaping Use   Vaping Use: Never used  Substance and Sexual Activity   Alcohol use: No   Drug use: Not Currently   Sexual activity: Never  Other Topics Concern   Not on file  Social History Narrative   Not on file   Social Determinants of Health   Financial Resource Strain: Not on file  Food Insecurity: Not on file  Transportation Needs: Not on file  Physical Activity: Not on file  Stress: Not on file  Social Connections: Not on file  Intimate Partner Violence: Not on file     Review of Systems  General:  No chills, fever, night sweats or weight changes.  Cardiovascular:  No chest pain, dyspnea on exertion, edema, orthopnea, palpitations, paroxysmal nocturnal dyspnea. Dermatological: No rash, lesions/masses Respiratory: No cough, dyspnea Urologic: No hematuria, dysuria Abdominal:   No nausea, vomiting, diarrhea, bright red blood per rectum, melena, or hematemesis Neurologic:  No visual changes, wkns, changes in mental status. All other systems reviewed and are otherwise negative except as noted above.  Physical Exam    VS:  BP 100/80 (BP Location: Left Arm, Patient Position: Sitting, Cuff Size: Normal)   Pulse (!) 48   Ht 6' (1.829 m)   Wt 136 lb 3.2 oz (61.8 kg)   SpO2 99%   BMI 18.47 kg/m  , BMI Body mass index is 18.47 kg/m. GEN: Well nourished, well developed, in no acute distress. HEENT: normal. Neck: Supple, no JVD, carotid bruits, or masses. Cardiac: RRR, no murmurs, rubs, or gallops. No clubbing, cyanosis, edema.  Radials/DP/PT 2+ and equal bilaterally.  Respiratory:  Respirations regular and unlabored, clear to auscultation bilaterally. GI: Soft, nontender, nondistended, BS + x 4. MS: no deformity or atrophy.   Skin:  warm and dry, no rash. Neuro:  Strength and sensation are intact.  Pressured speech Psych: Normal affect.  Accessory Clinical Findings    Recent Labs: 08/12/2022: Magnesium 2.2 08/13/2022: ALT 14 09/10/2022: Hemoglobin 12.8; Platelets 230 09/21/2022: BUN 11; Creatinine, Ser 1.10; Potassium 5.1; Sodium 141   Recent Lipid Panel    Component Value Date/Time   CHOL 130 08/07/2022 0241   TRIG 60 08/07/2022 0241   HDL 47 08/07/2022 0241   CHOLHDL 2.8 08/07/2022 0241   VLDL 12 08/07/2022 0241   LDLCALC 71 08/07/2022 0241         ECG personally reviewed by me today-none today.  Echocardiogram 08/06/2022  IMPRESSIONS     1. Coarse trabeculation of the apex without obvious thrombus. Left  ventricular ejection fraction, by estimation, is 35 to 40%. Left  ventricular ejection fraction by 2D MOD biplane is 37.1 %. The left  ventricle has moderately decreased function. The left   ventricle demonstrates global hypokinesis. There is mild left ventricular  hypertrophy. Left ventricular diastolic parameters are consistent with  Grade III diastolic dysfunction (restrictive). Elevated left ventricular  end-diastolic pressure.   2. Right ventricular systolic function is low normal. The right  ventricular size is normal. There is mildly elevated pulmonary artery  systolic pressure. The estimated right ventricular systolic pressure is  44.2 mmHg.   3. Left atrial size was severely dilated.   4. Moderate pericardial effusion. The pericardial effusion is  circumferential. There is no evidence of cardiac tamponade.   5. The mitral valve is abnormal. Moderate mitral valve regurgitation.   6. The aortic valve is tricuspid. Aortic valve regurgitation is trivial.   7. The inferior vena cava is normal in size with greater than 50%  respiratory variability, suggesting right atrial pressure of 3 mmHg.   Comparison(s): No prior Echocardiogram.   Assessment & Plan   1.  Acute on chronic systolic and  diastolic CHF-continues to progress with physical activity.  Continues to do physical therapy post CVA.  Echocardiogram showed LVEF of 35-40% with G3 DD.  Ischemic evaluation once he has recovered from CVA.  Limited in GDMT due to soft blood pressure. Continue spironolactone Heart healthy low-sodium diet reviewed Increase physical activity as tolerated Repeat echocardiogram    Atrial fibrillation-heart rate today 48 bpm.  This patients CHA2DS2-VASc Score and unadjusted Ischemic Stroke Rate (%  per year) is equal to 4.8 % stroke rate/year from a score of 4 (CHF, age, CVA).  Continue compliance with Eliquis and denies bleeding issues.  Avoid AV nodal blocking agents Continue amiodarone, apixaban Heart healthy low-sodium diet Increase physical activity as tolerated Avoid triggers caffeine, chocolate, EtOH, dehydration etc.  CVA-continues to progress.  Echocardiogram showed no evidence of thrombus. Continue apixaban Follows with neurology  Disposition: Follow-up with Dr. Bjorn Pippin in 6 months.   Thomasene Ripple. Marcene Laskowski NP-C     12/08/2022, 11:28 AM [facility] [address] Office [phone] Fax [phone]  Notice: This dictation was prepared with Dragon dictation along with smaller phrase technology. Any transcriptional errors that result from this process are unintentional and may not be corrected upon review.  I spent 14 minutes examining this patient, reviewing medications, and using patient centered shared decision making involving her cardiac care.  Prior to her visit I spent greater than 20 minutes reviewing her past medical history,  medications, and prior cardiac tests.

## 2022-12-07 ENCOUNTER — Ambulatory Visit: Payer: Medicare HMO | Admitting: Speech Pathology

## 2022-12-07 DIAGNOSIS — R1310 Dysphagia, unspecified: Secondary | ICD-10-CM

## 2022-12-07 DIAGNOSIS — R482 Apraxia: Secondary | ICD-10-CM | POA: Diagnosis not present

## 2022-12-07 DIAGNOSIS — R41841 Cognitive communication deficit: Secondary | ICD-10-CM

## 2022-12-07 DIAGNOSIS — R4701 Aphasia: Secondary | ICD-10-CM

## 2022-12-07 NOTE — Therapy (Signed)
OUTPATIENT SPEECH LANGUAGE PATHOLOGY TREATMENT NOTE   Patient Name: Dustin Blanchard MRN: [id number] DOB:05/02/1951, 71 y.o., male Today's Date: 12/07/2022  PCP: [facility] [place] PROVIDER: Vaughan Basta, Edman Circle, PA-C   END OF SESSION:   End of Session - 12/07/22 1312     Visit Number 24    Number of Visits 25    Date for SLP Re-Evaluation 12/09/22   to accomodate for 25 visits   Authorization Type [employer]    Progress Note Due on Visit 10    SLP Start Time 1312    SLP Stop Time  1357    SLP Time Calculation (min) 45 min    Activity Tolerance Patient tolerated treatment well                Past Medical History:  Diagnosis Date   Dementia [facility])    Past Surgical History:  Procedure Laterality Date   HERNIA REPAIR     HIP SURGERY     IR CT HEAD LTD  08/06/2022   IR CT HEAD LTD  08/06/2022   IR PERCUTANEOUS ART THROMBECTOMY/INFUSION INTRACRANIAL INC DIAG ANGIO  08/06/2022   IR US GUIDE VASC ACCESS RIGHT  08/06/2022   RADIOLOGY WITH ANESTHESIA N/A 08/06/2022   Procedure: IR WITH ANESTHESIA;  Surgeon: Luanne Bras, MD;  Location: [place];  Service: Radiology;  Laterality: N/A;   Patient Active Problem List   Diagnosis Date Noted   Pneumonia of right lower lobe due to infectious organism    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 No  OBJECTIVE:   TODAY'S  TREATMENT:  12-07-22: Target receptive language in ID objects with given semantic feature, with pt demonstrating 60% accuracy given usual repetition and mod-A. Sort objects into like categories with pt demonstrating 70% accuracy given usual mod-A. Pt with difficulties following single step instructions throughout tasks, even with usual repetition, stress of key words, and modeling. SLP A spouse in generating alternative strategies to facilitate pt being able to attend his church. Suggest that they consider someone trustworthy taking [name] and then dropping off with daughter in law following until spouse returns home. [name] to consider who could bring [place].   12-02-22: Target visual scanning and object ID during matching activity with photo cards. 3/5 and 3/5 accuracy with usual mod-A verbal cues. ID object with semantic cues: 8/10 objects. Given complete semantic description and phonemic cue, pt names 3/5 objects. Additional 2 required direct model. Following, pt able to name 0/5 given opportunity to recall objects he just names. Pt evidences frustration with task. Conclude session with modified melodic intonation therapy with use of familiar song. Following listening 1x through with pt singing along, pt able to successfully complete 8/9 lyric completions. Spouse questions if pt's language will continue to improve. SLP provides information on progress demonstrated thus far and information to A in carryover of language rich activities to facilitate ongoing  progress. Unable to make determination re: how much progress cna be expected 2/2 severity of impairment and underlying dementia at baseline.   11-30-22: Initate session with visual scanning task, with SLP having to branch down task to aid in pt's ability to complete task this date. Faciliate context this date was matching photos without text, with pt achieving 5/5 accuracy with mod-A for visual scanning. Following pt able to ID objects verbalized in 4/6 trials  with pt providing repetitions to self. SLP increased complexity to describing objects with semantic cues, pt ID 1/4 objects from [place], 2/4 from [place]. SLP questions spouse if any communication needs not being met at home for pt's safety, comfort, wants, needs, she denies. Does tell SLP pt is unable to tell stories.   11-25-22: Wife voices concern of how [name] would call for help should something happen to her. SLP provided some suggestions, with most highly recommended suggestion being implementation of life alert help button with verbal instruction (repeated) on when and how to use. Education on this being facilitative d/t not requiring expressive language skills and low cognitive burden to employ. SLP advised on plan to d/c following scheduled visits d/t maximized rehab potential and lack of progress demonstrated during structured or low pressure conversational opportunities. Asked spouse to think about any especially problematic areas SLP could A in problem solving prior to d/c. Education on dementia likely with affect on overall progress. SLP led pt through language task, ID odd object out with pt successful in 2/5 trials. With questioning cues, increases accuracy to 4/5. Names x1 function.   11-23-22: Given option, pt chooses to initiate session with melodic intonation therapy. SLP played familiar song, with pt humming along to melody throughout. Following, completes 8/12 phrase completion opportunities. Reviewed functional phrases previously targeted. Pt able to say x8 with delayed model, x2 with mod-I. Education on benefit of leveraging this neural network to facilitate communication and encouraged spouse to incorporate into routine. Demonstration for functional targets to imbed within daily routines. Spouse verbalizes understanding. Given x10 items, pt able to sort 10/10 into pre-defined categories. Names 10/10 with direct model, 2/10 with delayed model, 4/10 with phonemic cueing. Receptive ID of items 4/7  trials.    11-20-22: Initiated session with visual scanning task, as this is typically a facilitative strategy to aid in pt ability to scan during subsequent language tasks. Target ID and naming of family members with visual aid provided. Pt able to name 3/12 with max-A. Usual verbal cues to increase volume and over-articulate, pt with mumbling quality, overall unintelligible. Use of modified melodic intonation with familiar songs with pt humming along to x2 songs in entirety, then able to complete phrase completion of lyrics in 7/8 trials. SLP models phrase to greet granddaughter with pt able to produce with mod-I x1 with delay following initial model. Attempt to ID objects with Lingraphica device, set to L visual orientation, with success in 0/8 trials with max-A. Potential difficulty with visual perception of icons, though SLP did increase size. Was not hitting icons when attempting to make incorrect selection.  11-16-22: Target visual scanning and matching like objects with pt unable to process instructions for matching of colored cards with multiple attempts from SLP. Switched task to matching letters, with pt able to match 4/6, with R visual cut impacting success of remaining 2. SLP modified presenation with brightly colored sheet to enable scanning to set end point, with pt able to scan R and find remaining 2 letters for accurate matching. Second attempt  with increased field of 10, pt with 70% accuracy with usual, faded to intermittently, verbal cues for orientation to task and ongoing cues for scanning R and up or down depending on which line pt has chosen to focus on. Pt requires max-A to scan to far edge of visual field defined by brightly colored piece of paper. An AAC device would likely require modification to accommodate R visual cut as little improvement noted in this area. Attempt to ID and name family members. Receptive ID from [place], 2/5 trials. Names 2/2 with max-A and direct model.    PATIENT  EDUCATION: Education details: see above Person educated: Patient and Spouse Education method: Explanation, Demonstration, and Handouts Education comprehension: verbalized understanding, returned demonstration, and needs further education   GOALS: Goals reviewed with patient? Yes   SHORT TERM GOALS: Target date: 10/12/2022   Pt will average 80% accuracy in practical naming tasks with usual mod-A over 2 sessions.  Baseline: 10-06-22, 10-08-22 Goal status: MET   2.  Spouse will teach back supportive communication strategies to aid in auditory comprehension at home with supervision A.   Baseline: 10-12-22 Goal status: MET   3.  Pt will comprehend  written choice f:3 at word level with known context 80% to make accurate selections of preferences, answer questions at home with occasional min A Baseline: 10-08-22 Goal status: MET   4.  Pt will be intelligible in 2 word phrase production in 15/20 trials with direct model and usual mod-A  Baseline: 10-12-22 Goal status: PARTIALLY MET   5.  Pt will complete augmentative communication/SGD canidacy assessment to determine appropriate communication device to support communication of wants/needs/thoughts Baseline: 10-12-22 Goal status: NOT MET, ONGOING     LONG TERM GOALS: Target date: 12/09/2022    Pt will use AAC/multimodal communicate to communicate choices, wants at home with family and caregiver over 1 week Baseline:  Goal status: ONGOING   2.  Pt will name 5 items in personally relevant categories with occasional mod A over 2 sessions Baseline:  Goal status: ONGOING   3.  Pt will employ dysarthria strategies resulting in 50% intelligibility in self-generated words and phrases with usual mod-A Baseline:  Goal status: ONGOING   4.  Pt will participate in clinical swallow evaluation to assess for candidacy for upgraded diet textures and liquid consistencies PRN Baseline:  Goal status: MET     ASSESSMENT:   CLINICAL  IMPRESSION: Ongoing significant expressive > receptive language deficits impacting overall communication abilities. Ongoing education and training to rehabilitate expressive and receptive language with pt demonstrating improving ability to repeat direct models and then subsequently name targeted items with short delay. Melodic intonation therapeutic intervention appears motivating for pt and is successful in therapy sessions for improving clarity of speech at phrase level. Pt with improving object discrimination and ability to ID objects both real and pictured with extended time. Have upgraded diet to thin liquids and minced/moist solids without overt difficulties. Severity of cognitive impairments continues to be difficult to assess 2/2 communication impairment. Pt benefiting from usual repetition, use of simple and direct language, visual and verbal cueing for participation in assessment. SLP recommends continued skilled ST intervention course to facilitate improved communication efficacy, reduce caregiver burden, and improve QoL.    OBJECTIVE IMPAIRMENTS include attention, memory, executive functioning, aphasia, dysarthria, and dysphagia. These impairments are limiting patient from ADLs/IADLs, effectively communicating at home and in community, and safety when swallowing. Factors affecting potential to achieve goals and functional outcome are ability to learn/carryover  information, co-morbidities, previous level of function, and financial resources. Patient will benefit from skilled SLP services to address above impairments and improve overall function.   REHAB POTENTIAL: Fair (co-morbidities)   PLAN: SLP FREQUENCY: 2x/week   SLP DURATION: 12 weeks   PLANNED INTERVENTIONS: Aspiration precaution training, Pharyngeal strengthening exercises, Diet toleration management , Language facilitation, Trials of upgraded texture/liquids, Cueing hierachy, Cognitive reorganization, Internal/external aids, Oral  motor exercises, Functional tasks, Multimodal communication approach, SLP instruction and feedback, Compensatory strategies, Patient/family education, and Re-evaluation   Su Monks, CCC-SLP 12/07/2022, 1:12 PM

## 2022-12-08 ENCOUNTER — Encounter: Payer: Self-pay | Admitting: General Practice

## 2022-12-08 ENCOUNTER — Ambulatory Visit: Payer: Medicare HMO | Attending: General Practice | Admitting: General Practice

## 2022-12-08 VITALS — BP 100/80 | HR 48 | Ht 72.0 in | Wt 136.2 lb

## 2022-12-08 DIAGNOSIS — Z8673 Personal history of transient ischemic attack (TIA), and cerebral infarction without residual deficits: Secondary | ICD-10-CM

## 2022-12-08 DIAGNOSIS — I4891 Unspecified atrial fibrillation: Secondary | ICD-10-CM

## 2022-12-08 DIAGNOSIS — I5043 Acute on chronic combined systolic (congestive) and diastolic (congestive) heart failure: Secondary | ICD-10-CM | POA: Diagnosis not present

## 2022-12-08 NOTE — Patient Instructions (Signed)
Medication Instructions:  The current medical regimen is effective;  continue present plan and medications as directed. Please refer to the Current Medication list given to you today.  *If you need a refill on your cardiac medications before your next appointment, please call your pharmacy*  Lab Work: NONE If you have labs (blood work) drawn today and your tests are completely normal, you will receive your results only by:  MyChart Message (if you have MyChart) OR A paper copy in the mail If you have any lab test that is abnormal or we need to change your treatment, we will call you to review the results.  Testing/Procedures: Echocardiogram - Your physician has requested that you have an echocardiogram. Echocardiography is a painless test that uses sound waves to create images of your heart. It provides your doctor with information about the size and shape of your heart and how well your heart's chambers and valves are working. This procedure takes approximately one hour. There are no restrictions for this procedure.    Follow-Up: At [facility], you and your health needs are our priority.  As part of our continuing mission to provide you with exceptional heart care, we have created designated Provider Care Teams.  These Care Teams include your primary Cardiologist (physician) and Advanced Practice Providers (APPs -  Physician Assistants and Nurse Practitioners) who all work together to provide you with the care you need, when you need it.  We recommend signing up for the patient portal called "MyChart".  Sign up information is provided on this After Visit Summary.  MyChart is used to connect with patients for Virtual Visits (Telemedicine).  Patients are able to view lab/test results, encounter notes, upcoming appointments, etc.  Non-urgent messages can be sent to your provider as well.   To learn more about what you can do with MyChart, go to [email or web address].    Your next  appointment:   6 month(s)  The format for your next appointment:   In Person  Provider:   Little Ishikawa, MD    Other Instructions CONTINUE YOUR PHYSICAL THERAPY, OCCUPATIONAL AND SPEECH  Important Information About [place]

## 2022-12-09 ENCOUNTER — Ambulatory Visit: Payer: Medicare HMO | Admitting: Speech Pathology

## 2022-12-09 DIAGNOSIS — R482 Apraxia: Secondary | ICD-10-CM | POA: Diagnosis not present

## 2022-12-09 DIAGNOSIS — R4701 Aphasia: Secondary | ICD-10-CM

## 2022-12-09 DIAGNOSIS — R1310 Dysphagia, unspecified: Secondary | ICD-10-CM

## 2022-12-09 DIAGNOSIS — R41841 Cognitive communication deficit: Secondary | ICD-10-CM

## 2022-12-09 NOTE — Patient Instructions (Signed)
Tips for Talking with People who have Aphasia  Say one thing at a time Don't  rush - slow down, be patient Talk face to face Reduce background noise Relax - be natural Use pen and paper Write down key words Draw diagrams or pictures Don't pretend you understand Ask what helps Recap - check you both understand Be a partner, not a therapist   Resources  TalkPath Therapy app by Ruben [employer] Connections on [employer] website National Aphasia Association - naa.aphasia.org Aphasia Recovery Connection - aphasiarecoveryconnection.org Tactus therapy apps Constant Therapy  Podcasts and IG accounts:  [employer] Podcast Recovery After Stroke Podcast Stroke Recovery Association Stroke Onward  Watch: Patience Listening and Communicating with Aphasia Patients on YouTube BrowserReview.ca   Provided by: Vanna Scotland, [phone]  Home Practice:  Sing familiar songs Listen to a song all the way through Then try the lyric completions Name objects in environment See if doug can name, may give him the first sound  Name it, have [name] repeat it. wait 5 seconds and see if he can remember  Describe objects within sight and see if Gala Romney can identify it Phrase completions Look through photo albums and describe who, what, where, when, memories

## 2022-12-09 NOTE — Therapy (Signed)
OUTPATIENT SPEECH LANGUAGE PATHOLOGY TREATMENT NOTE   Patient Name: Dustin Blanchard MRN: [id number] DOB:01/30/1951, 71 y.o., male Today's Date: 12/09/2022  PCP: [facility] [place] PROVIDER: Vaughan Basta, Edman Circle, PA-C   END OF SESSION:   End of Session - 12/09/22 1358     Visit Number 25    Number of Visits 25    Date for SLP Re-Evaluation 12/09/22   to accomodate for 25 visits   Authorization Type [employer]na Medicare    Progress Note Due on Visit 10    SLP Start Time 1400    SLP Stop Time  1436    SLP Time Calculation (min) 36 min    Activity Tolerance Patient tolerated treatment well                Past Medical History:  Diagnosis Date   Dementia [facility])    Past Surgical History:  Procedure Laterality Date   HERNIA REPAIR     HIP SURGERY     IR CT HEAD LTD  08/06/2022   IR CT HEAD LTD  08/06/2022   IR PERCUTANEOUS ART THROMBECTOMY/INFUSION INTRACRANIAL INC DIAG ANGIO  08/06/2022   IR US GUIDE VASC ACCESS RIGHT  08/06/2022   RADIOLOGY WITH ANESTHESIA N/A 08/06/2022   Procedure: IR WITH ANESTHESIA;  Surgeon: Luanne Bras, MD;  Location: [place];  Service: Radiology;  Laterality: N/A;   Patient Active Problem List   Diagnosis Date Noted   Pneumonia of right lower lobe due to infectious organism    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 I can't think right"   PAIN:  Are you having pain?  No  OBJECTIVE:   TODAY'S TREATMENT:  12-09-22: Today is pt's final ST session, SLP provided education to pt and spouse regarding ongoing HEP to maximize communication progress. Handout provided. SLP provided care partner with resources which may be of assistance to [place]. Reviewed diet recommendations and compensations, with spouse verbalizing understanding via teach back. Modeled supportive communication techniques, with spouse abe to confirm via demonstration following initial instruction.   12-07-22: Target receptive language in ID objects with given semantic feature, with pt demonstrating 60% accuracy given usual repetition and mod-A. Sort objects into like categories with pt demonstrating 70% accuracy given usual mod-A. Pt with difficulties following single step instructions throughout tasks, even with usual repetition, stress of key words, and modeling. SLP A spouse in generating alternative strategies to facilitate pt being able to attend his church. Suggest that they consider someone trustworthy taking [name] and then dropping off with daughter in law following until spouse returns home. [name] to consider who could bring [place].   12-02-22: Target visual scanning and object ID during matching activity with photo cards. 3/5 and 3/5 accuracy with usual mod-A verbal cues. ID object with semantic cues: 8/10 objects. Given complete semantic description and phonemic cue, pt names 3/5 objects. Additional 2 required direct model. Following, pt able to name 0/5 given opportunity to  recall objects he just names. Pt evidences frustration with task. Conclude session with modified melodic intonation therapy with use of familiar song. Following listening 1x through with pt singing along, pt able to successfully complete 8/9 lyric completions. Spouse questions if pt's language will continue to improve. SLP provides information on progress demonstrated thus far and information to A in carryover of language rich  activities to facilitate ongoing progress. Unable to make determination re: how much progress cna be expected 2/2 severity of impairment and underlying dementia at baseline.   11-30-22: Initate session with visual scanning task, with SLP having to branch down task to aid in pt's ability to complete task this date. Faciliate context this date was matching photos without text, with pt achieving 5/5 accuracy with mod-A for visual scanning. Following pt able to ID objects verbalized in 4/6 trials with pt providing repetitions to self. SLP increased complexity to describing objects with semantic cues, pt ID 1/4 objects from [place], 2/4 from [place]. SLP questions spouse if any communication needs not being met at home for pt's safety, comfort, wants, needs, she denies. Does tell SLP pt is unable to tell stories.   11-25-22: Wife voices concern of how [name] would call for help should something happen to her. SLP provided some suggestions, with most highly recommended suggestion being implementation of life alert help button with verbal instruction (repeated) on when and how to use. Education on this being facilitative d/t not requiring expressive language skills and low cognitive burden to employ. SLP advised on plan to d/c following scheduled visits d/t maximized rehab potential and lack of progress demonstrated during structured or low pressure conversational opportunities. Asked spouse to think about any especially problematic areas SLP could A in problem solving prior to d/c. Education on dementia likely with affect on overall progress. SLP led pt through language task, ID odd object out with pt successful in 2/5 trials. With questioning cues, increases accuracy to 4/5. Names x1 function.   11-23-22: Given option, pt chooses to initiate session with melodic intonation therapy. SLP played familiar song, with pt humming along to melody throughout. Following, completes 8/12 phrase completion opportunities. Reviewed  functional phrases previously targeted. Pt able to say x8 with delayed model, x2 with mod-I. Education on benefit of leveraging this neural network to facilitate communication and encouraged spouse to incorporate into routine. Demonstration for functional targets to imbed within daily routines. Spouse verbalizes understanding. Given x10 items, pt able to sort 10/10 into pre-defined categories. Names 10/10 with direct model, 2/10 with delayed model, 4/10 with phonemic cueing. Receptive ID of items 4/7 trials.    11-20-22: Initiated session with visual scanning task, as this is typically a facilitative strategy to aid in pt ability to scan during subsequent language tasks. Target ID and naming of family members with visual aid provided. Pt able to name 3/12 with max-A. Usual verbal cues to increase volume and over-articulate, pt with mumbling quality, overall unintelligible. Use of modified melodic intonation with familiar songs with pt humming along to x2 songs in entirety, then able to complete phrase completion of lyrics in 7/8 trials. SLP models phrase to greet granddaughter with pt able to produce with mod-I x1 with delay following initial model. Attempt to ID objects with Lingraphica device, set to L visual orientation, with success in 0/8 trials with max-A. Potential difficulty with visual perception of icons, though SLP did increase size. Was not hitting icons when attempting to make incorrect selection.  11-16-22: Target visual scanning and matching  like objects with pt unable to process instructions for matching of colored cards with multiple attempts from SLP. Switched task to matching letters, with pt able to match 4/6, with R visual cut impacting success of remaining 2. SLP modified presenation with brightly colored sheet to enable scanning to set end point, with pt able to scan R and find remaining 2 letters for accurate matching. Second attempt with increased field of 10, pt with 70% accuracy with  usual, faded to intermittently, verbal cues for orientation to task and ongoing cues for scanning R and up or down depending on which line pt has chosen to focus on. Pt requires max-A to scan to far edge of visual field defined by brightly colored piece of paper. An AAC device would likely require modification to accommodate R visual cut as little improvement noted in this area. Attempt to ID and name family members. Receptive ID from [place], 2/5 trials. Names 2/2 with max-A and direct model.    PATIENT EDUCATION: Education details: see above Person educated: Patient and Spouse Education method: Explanation, Demonstration, and Handouts Education comprehension: verbalized understanding, returned demonstration, and needs further education   GOALS: Goals reviewed with patient? Yes   SHORT TERM GOALS: Target date: 10/12/2022   Pt will average 80% accuracy in practical naming tasks with usual mod-A over 2 sessions.  Baseline: 10-06-22, 10-08-22 Goal status: MET   2.  Spouse will teach back supportive communication strategies to aid in auditory comprehension at home with supervision A.   Baseline: 10-12-22 Goal status: MET   3.  Pt will comprehend  written choice f:3 at word level with known context 80% to make accurate selections of preferences, answer questions at home with occasional min A Baseline: 10-08-22 Goal status: MET   4.  Pt will be intelligible in 2 word phrase production in 15/20 trials with direct model and usual mod-A  Baseline: 10-12-22 Goal status: PARTIALLY MET   5.  Pt will complete augmentative communication/SGD canidacy assessment to determine appropriate communication device to support communication of wants/needs/thoughts Baseline: 10-12-22 Goal status: NOT MET     LONG TERM GOALS: Target date: 12/09/2022    Pt will use AAC/multimodal communicate to communicate choices, wants at home with family and caregiver over 1 week Baseline:  Goal status: not met   2.  Pt  will name 5 items in personally relevant categories with occasional mod A over 2 sessions Baseline:  Goal status: not met   3.  Pt will employ dysarthria strategies resulting in 50% intelligibility in self-generated words and phrases with usual mod-A Baseline: 12/09/2022 Goal status: partially met   4.  Pt will participate in clinical swallow evaluation to assess for candidacy for upgraded diet textures and liquid consistencies PRN Baseline:  Goal status: MET     ASSESSMENT:   CLINICAL IMPRESSION: Ongoing significant expressive > receptive language deficits impacting overall communication abilities. Ongoing education and training to rehabilitate expressive and receptive language with pt demonstrating improving ability to repeat direct models and then subsequently name targeted items with short delay. Melodic intonation therapeutic intervention appears motivating for pt and is successful in therapy sessions for improving clarity of speech at phrase level. Pt with improving object discrimination and ability to ID objects both real and pictured with extended time. Have upgraded diet to thin liquids and minced/moist solids without overt difficulties. Severity of cognitive impairments continues to be difficult to assess 2/2 communication impairment. Pt benefiting from usual repetition, use of simple and direct language, visual  and verbal cueing for participation in assessment. SLP recommends continued skilled ST intervention course to facilitate improved communication efficacy, reduce caregiver burden, and improve QoL.    OBJECTIVE IMPAIRMENTS include attention, memory, executive functioning, aphasia, dysarthria, and dysphagia. These impairments are limiting patient from ADLs/IADLs, effectively communicating at home and in community, and safety when swallowing. Factors affecting potential to achieve goals and functional outcome are ability to learn/carryover information, co-morbidities, previous level  of function, and financial resources. Patient will benefit from skilled SLP services to address above impairments and improve overall function.   REHAB POTENTIAL: Fair (co-morbidities)   PLAN: SLP FREQUENCY: 2x/week   SLP DURATION: 12 weeks   PLANNED INTERVENTIONS: Aspiration precaution training, Pharyngeal strengthening exercises, Diet toleration management , Language facilitation, Trials of upgraded texture/liquids, Cueing hierachy, Cognitive reorganization, Internal/external aids, Oral motor exercises, Functional tasks, Multimodal communication approach, SLP instruction and feedback, Compensatory strategies, Patient/family education, and Re-evaluation  SPEECH THERAPY DISCHARGE SUMMARY  Visits from Start of Care: 25  Current functional level related to goals / functional outcomes: 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 Limited spontaneous verbalizations without direct model. Caregiver able to demonstrate use of supportive communication techniques and endorses improved communication at home through employment of strategies.    Remaining deficits: Aphasia, dysarthria, cognitive impairment    Education / Equipment: Structured language activities, supportive communication, cueing hierarchy, visual scanning, HEP    Patient agrees to discharge. Patient goals were partially met. Patient is being discharged due to maximized rehab potential.     Su Monks, [place] 12/09/2022, 1:59 PM

## 2022-12-29 ENCOUNTER — Encounter: Payer: Medicare HMO | Attending: Physical Medicine and Rehabilitation | Admitting: Physical Medicine and Rehabilitation

## 2022-12-29 ENCOUNTER — Encounter: Payer: Self-pay | Admitting: Physical Medicine and Rehabilitation

## 2022-12-29 VITALS — BP 104/69 | HR 42 | Ht 72.0 in | Wt 139.0 lb

## 2022-12-29 DIAGNOSIS — R197 Diarrhea, unspecified: Secondary | ICD-10-CM | POA: Diagnosis present

## 2022-12-29 DIAGNOSIS — R1030 Lower abdominal pain, unspecified: Secondary | ICD-10-CM | POA: Insufficient documentation

## 2022-12-29 DIAGNOSIS — I63512 Cerebral infarction due to unspecified occlusion or stenosis of left middle cerebral artery: Secondary | ICD-10-CM | POA: Insufficient documentation

## 2022-12-29 MED ORDER — LOPERAMIDE HCL 2 MG PO TABS: 2.0000 mg | ORAL_TABLET | Freq: Four times a day (QID) | ORAL | 0 refills | Status: DC | PRN

## 2022-12-29 NOTE — Progress Notes (Signed)
Subjective:    Patient ID: Dustin Blanchard, male    DOB: [birth year], 72 y.o.   MRN: [id number]  HPI Dustin Blanchard is a 72 year old man who presents for follow-up of CVA.  1) CVA - completed therapy -sleeping better at night  2) Pain in groin -wife gives him tylenol when he is complaining  3) Diarrhea   Pain Inventory Average Pain 3 Pain Right Now 3 My pain is intermittent and sharp  LOCATION OF PAIN  groin  BOWEL Number of stools per week: several Oral laxative use No    BLADDER Normal    Mobility walk without assistance ability to climb steps?  yes do you drive?  no Do you have any goals in this area?  yes  Function retired I need assistance with the following:  toileting Do you have any goals in this area?  yes  Neuro/Psych bowel control problems  Prior Studies Any changes since last visit?  no  Physicians involved in your care Any changes since last visit?  no   No family history on file. Social History   Socioeconomic History   Marital status: Married    Spouse name: Not on file   Number of children: Not on file   Years of education: Not on file   Highest education level: Not on file  Occupational History   Not on file  Tobacco Use   Smoking status: Former   Smokeless tobacco: Never  Vaping Use   Vaping Use: Never used  Substance and Sexual Activity   Alcohol use: No   Drug use: Not Currently   Sexual activity: Never  Other Topics Concern   Not on file  Social History Narrative   Not on file   Social Determinants of Health   Financial Resource Strain: Not on file  Food Insecurity: Not on file  Transportation Needs: Not on file  Physical Activity: Not on file  Stress: Not on file  Social Connections: Not on file   Past Surgical History:  Procedure Laterality Date   HERNIA REPAIR     HIP SURGERY     IR CT HEAD LTD  08/06/2022   IR CT HEAD LTD  08/06/2022   IR PERCUTANEOUS ART THROMBECTOMY/INFUSION INTRACRANIAL INC DIAG ANGIO   08/06/2022   IR US GUIDE VASC ACCESS RIGHT  08/06/2022   RADIOLOGY WITH ANESTHESIA N/A 08/06/2022   Procedure: IR WITH ANESTHESIA;  Surgeon: Luanne Bras, MD;  Location: [facility];  Service: Radiology;  Laterality: N/A;   Past Medical History:  Diagnosis Date   Dementia ([place])    Wt 139 lb (63 kg)   BMI 18.85 kg/m   Opioid Risk Score:   Fall Risk Score:  `1  Depression screen PHQ 2/9      No data to display          Review of Systems  Gastrointestinal:  Positive for diarrhea.       Groin pain  All other systems reviewed and are negative.      Objective:   Physical Exam  Gen: no distress, normal appearing HEENT: oral mucosa pink and moist, NCAT Cardio: Reg rate Chest: normal effort, normal rate of breathing Abd: soft, non-distended Ext: no edema Psych: pleasant, normal affect Skin: intact Neuro: 4/5 strength throughout, speaks a few words      Assessment & Plan:   1) CVA -continue therapies -would benefit from handicap placard to increase mobility in the community -reviewed all medications and provided necessary refills. -  discussed vagal nerve stimulation if strength fails to improve in 6 months.   2) Pain in the groin -Provided with a pain relief journal and discussed that it contains foods and lifestyle tips to naturally help to improve pain. Discussed that these lifestyle strategies are also very good for health unlike some medications which can have negative side effects. Discussed that the act of keeping a journal can be therapeutic and helpful to realize patterns what helps to trigger and alleviate pain.    3) Diarrhea -continue imodium

## 2023-01-06 ENCOUNTER — Emergency Department (HOSPITAL_COMMUNITY)
Admission: EM | Admit: 2023-01-06 | Discharge: 2023-01-07 | Discharge status: Against Medical Advice | Payer: Medicare HMO | Attending: Emergency Medicine | Admitting: Emergency Medicine

## 2023-01-06 ENCOUNTER — Encounter (HOSPITAL_COMMUNITY): Payer: Self-pay

## 2023-01-06 ENCOUNTER — Other Ambulatory Visit: Payer: Self-pay

## 2023-01-06 DIAGNOSIS — R1030 Lower abdominal pain, unspecified: Secondary | ICD-10-CM | POA: Insufficient documentation

## 2023-01-06 DIAGNOSIS — Z5321 Procedure and treatment not carried out due to patient leaving prior to being seen by health care provider: Secondary | ICD-10-CM | POA: Diagnosis not present

## 2023-01-06 DIAGNOSIS — R319 Hematuria, unspecified: Secondary | ICD-10-CM | POA: Insufficient documentation

## 2023-01-06 DIAGNOSIS — R300 Dysuria: Secondary | ICD-10-CM | POA: Diagnosis not present

## 2023-01-06 LAB — URINALYSIS, ROUTINE W REFLEX MICROSCOPIC
Bilirubin Urine: NEGATIVE
Glucose, UA: NEGATIVE mg/dL
Ketones, ur: NEGATIVE mg/dL
Nitrite: NEGATIVE
Protein, ur: 100 mg/dL — AB
RBC / HPF: >50 RBC/hpf — ABNORMAL HIGH (ref 0–5)
Specific Gravity, Urine: 1.02 (ref 1.005–1.030)
pH: 5 (ref 5.0–8.0)

## 2023-01-06 LAB — COMPREHENSIVE METABOLIC PANEL
ALT: 13 U/L (ref 0–44)
AST: 20 U/L (ref 15–41)
Albumin: 3.5 g/dL (ref 3.5–5.0)
Alkaline Phosphatase: 60 U/L (ref 38–126)
Anion gap: 8 (ref 5–15)
BUN: 11 mg/dL (ref 8–23)
CO2: 25 mmol/L (ref 22–32)
Calcium: 9.4 mg/dL (ref 8.9–10.3)
Chloride: 107 mmol/L (ref 98–111)
Creatinine, Ser: 0.98 mg/dL (ref 0.61–1.24)
GFR, Estimated: >60 mL/min (ref >60)
Glucose, Bld: 104 mg/dL — ABNORMAL HIGH (ref 70–99)
Potassium: 4.2 mmol/L (ref 3.5–5.1)
Sodium: 140 mmol/L (ref 135–145)
Total Bilirubin: 0.9 mg/dL (ref 0.3–1.2)
Total Protein: 6.2 g/dL — ABNORMAL LOW (ref 6.5–8.1)

## 2023-01-06 LAB — CBC WITH DIFFERENTIAL/PLATELET
Abs Immature Granulocytes: 0.02 10*3/uL (ref 0.00–0.07)
Basophils Absolute: 0 10*3/uL (ref 0.0–0.1)
Basophils Relative: 1 %
Eosinophils Absolute: 0.1 10*3/uL (ref 0.0–0.5)
Eosinophils Relative: 1 %
HCT: 43 % (ref 39.0–52.0)
Hemoglobin: 14 g/dL (ref 13.0–17.0)
Immature Granulocytes: 0 %
Lymphocytes Relative: 15 %
Lymphs Abs: 0.8 10*3/uL (ref 0.7–4.0)
MCH: 32.3 pg (ref 26.0–34.0)
MCHC: 32.6 g/dL (ref 30.0–36.0)
MCV: 99.3 fL (ref 80.0–100.0)
Monocytes Absolute: 0.3 10*3/uL (ref 0.1–1.0)
Monocytes Relative: 5 %
Neutro Abs: 4.4 10*3/uL (ref 1.7–7.7)
Neutrophils Relative %: 78 %
Platelets: 242 10*3/uL (ref 150–400)
RBC: 4.33 MIL/uL (ref 4.22–5.81)
RDW: 12.8 % (ref 11.5–15.5)
WBC: 5.6 10*3/uL (ref 4.0–10.5)
nRBC: 0 % (ref 0.0–0.2)

## 2023-01-06 NOTE — ED Triage Notes (Signed)
Pt came in POV d/t hematuria & dysuria for the past 2 days. Dark red in color, also having difficulty passing stools. Lower abd/groin cramps lately as well. Denies any fevers or n/v. Yesterday evening he was unable to urinate & his PCP advised him that he needs to come in for eval. Does have Hx of prostate problems that he takes meds for.

## 2023-01-06 NOTE — ED Notes (Signed)
Unable to urinate at this time

## 2023-01-06 NOTE — ED Provider Triage Note (Signed)
Emergency Medicine Provider Triage Evaluation Note  Dustin Blanchard , a 72 y.o. male  was evaluated in triage.  Pt complains of hematuria and dysuria for the past 3 days.  He reports that his urine is dark red in color and he is also having difficulty passing stools, has had loose stools with blood.  He reports lower abdominal/groin cramps.  Denies fevers, nausea, vomiting.  Yesterday evening patient states he was unable to urinate and his PCP advised him that he needs to proceed to ED for evaluation.  Does of history significant for prostate problems.  Review of Systems  Positive: As above Negative: As above  Physical Exam  BP 104/79   Pulse (!) 106   Temp 98.8 F (37.1 C)   Resp 15   Ht 6\' 1"  (1.854 m)   Wt 60.8 kg   SpO2 100%   BMI 17.68 kg/m  Gen:   Awake, no distress   Resp:  Normal effort  MSK:   Moves extremities without difficulty  Other:    Medical Decision Making  Medically screening exam initiated at 1:59 PM.  Appropriate orders placed.  Dustin Blanchard was informed that the remainder of the evaluation will be completed by another provider, this initial triage assessment does not replace that evaluation, and the importance of remaining in the ED until their evaluation is complete.     Theressa Stamps R, [place] 01/06/23 1404

## 2023-01-07 NOTE — ED Notes (Signed)
Pt not responding for vital recheck

## 2023-01-07 NOTE — ED Notes (Signed)
Pt not responding for vitals 3x  

## 2023-01-11 ENCOUNTER — Ambulatory Visit (HOSPITAL_COMMUNITY): Payer: Medicare HMO | Attending: General Practice

## 2023-01-11 DIAGNOSIS — I5043 Acute on chronic combined systolic (congestive) and diastolic (congestive) heart failure: Secondary | ICD-10-CM | POA: Diagnosis not present

## 2023-01-11 LAB — ECHOCARDIOGRAM COMPLETE
Area-P 1/2: 3.51 cm2
P 1/2 time: 1045 msec
S' Lateral: 4.4 cm

## 2023-01-24 NOTE — Progress Notes (Unsigned)
Cardiology Office Note:    Date:  01/25/2023   ID:  Dustin Blanchard, DOB 1951-06-29, MRN [id number]  PCP:  [facility], Dustin Blanchard  Cardiologist:  Dustin Ishikawa, MD  Electrophysiologist:  None   Referring MD: Dustin Blanchard*   Chief Complaint  Patient presents with   Follow-up   Congestive Heart Failure    History of Present Illness:    Dustin Blanchard is a 72 y.o. male with a hx of chronic combined systolic and diastolic heart failure, CVA, atrial fibrillation, dementia who presents from follow-up.  He was admitted 07/2022 with CVA.  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  Cardiology was consulted for new onset atrial fibrillation with RVR and heart failure.  Echocardiogram showed EF 35-40%.  He was started on Cardizem drip initially and became bradycardic, had sinus pauses up to 4.7 seconds.  EP was consulted and recommended amiodarone to try and maintain sinus rhythm.  Echocardiogram 01/11/2023 showed EF 20 to 25%, moderate RV dysfunction, small pericardial effusion, mild to moderate MR, mild AI, mild dilatation of aortic root measuring 40 mm.    Since last clinic visit, his wife reports he seems to be doing okay today.  He continues to have aphasia.  Denies any chest pain.  Does report shortness of breath on initial questioning but then denies.  Past Medical History:  Diagnosis Date   Dementia [facility])     Past Surgical History:  Procedure Laterality Date   HERNIA REPAIR     HIP SURGERY     IR CT HEAD LTD  08/06/2022   IR CT HEAD LTD  08/06/2022   IR PERCUTANEOUS ART THROMBECTOMY/INFUSION INTRACRANIAL INC DIAG ANGIO  08/06/2022   IR US GUIDE VASC ACCESS RIGHT  08/06/2022   RADIOLOGY WITH ANESTHESIA N/A 08/06/2022   Procedure: IR WITH ANESTHESIA;  Surgeon: Dustin Cotton, MD;  Location: [facility] OR;  Service: Radiology;  Laterality: N/A;    Current Medications: Current Meds  Medication Sig   acetaminophen (TYLENOL) 325 MG tablet Take 2  tablets (650 mg total) by mouth every 4 (four) hours as needed for mild pain (or temp > 37.5 C (99.5 F)).   apixaban (ELIQUIS) 5 MG TABS tablet Take 1 tablet (5 mg total) by mouth 2 (two) times daily.   atorvastatin (LIPITOR) 20 MG tablet Take 20 mg by mouth at bedtime.   B Complex Vitamins (VITAMIN B COMPLEX PO)    cholecalciferol (VITAMIN D3) 10 MCG (400 UNIT) TABS tablet Take 2 tablets (800 Units total) by mouth daily.   loperamide (IMODIUM A-D) 2 MG tablet Take 1 tablet (2 mg total) by mouth 4 (four) times daily as needed for diarrhea or loose stools.   losartan (COZAAR) 25 MG tablet Take 0.5 tablets (12.5 mg total) by mouth daily.   magnesium gluconate (MAGONATE) 500 MG tablet Take 0.5 tablets (250 mg total) by mouth at bedtime.   melatonin 3 MG TABS tablet Take 1 tablet (3 mg total) by mouth at bedtime.   mirtazapine (REMERON) 7.5 MG tablet Take 1 tablet (7.5 mg total) by mouth at bedtime.   polyethylene glycol (MIRALAX / GLYCOLAX) 17 g packet Take 17 g by mouth daily.   QUEtiapine (SEROQUEL) 25 MG tablet TAKE 1 TABLET (25 MG TOTAL) BY MOUTH DAILY WITH SUPPER.   RA ACETAMINOPHEN EX ST 500 MG tablet Take 1 tablet by mouth every six hours as needed   tamsulosin (FLOMAX) 0.4 MG CAPS capsule Take 1 capsule (0.4 mg total) by  mouth daily.   valproic acid (DEPAKENE) 250 MG/5ML solution Take 5 mLs (250 mg total) by mouth at bedtime.   [DISCONTINUED] amiodarone (PACERONE) 200 MG tablet Take 1 tablet (200 mg total) by mouth daily.   [DISCONTINUED] atorvastatin (LIPITOR) 40 MG tablet Take 1 tablet (40 mg total) by mouth daily.   [DISCONTINUED] B Complex-C (B-COMPLEX WITH VITAMIN C) tablet Take 1 tablet by mouth daily.   [DISCONTINUED] Multiple Vitamin (MULTIVITAMIN WITH MINERALS) TABS tablet Take 1 tablet by mouth daily.   [DISCONTINUED] spironolactone (ALDACTONE) 25 MG tablet Take 1 tablet (25 mg total) by mouth daily.     Allergies:   Patient has no known allergies.   Social History    Socioeconomic History   Marital status: Married    Spouse name: Not on file   Number of children: Not on file   Years of education: Not on file   Highest education level: Not on file  Occupational History   Not on file  Tobacco Use   Smoking status: Former   Smokeless tobacco: Never  Vaping Use   Vaping Use: Never used  Substance and Sexual Activity   Alcohol use: No   Drug use: Not Currently   Sexual activity: Never  Other Topics Concern   Not on file  Social History Narrative   Not on file   Social Determinants of Health   Financial Resource Strain: Not on file  Food Insecurity: Not on file  Transportation Needs: Not on file  Physical Activity: Not on file  Stress: Not on file  Social Connections: Not on file     Family History: The patient's family history is not on file.  ROS:   Please see the history of present illness.     All other systems reviewed and are negative.  EKGs/Labs/Other Studies Reviewed:    The following studies were reviewed today:   EKG:   01/25/2023: Atrial fibrillation, rate 43  Recent Labs: 08/12/2022: Magnesium 2.2 01/06/2023: ALT 13; BUN 11; Creatinine, Ser 0.98; Hemoglobin 14.0; Platelets 242; Potassium 4.2; Sodium 140  Recent Lipid Panel    Component Value Date/Time   CHOL 130 08/07/2022 0241   TRIG 60 08/07/2022 0241   HDL 47 08/07/2022 0241   CHOLHDL 2.8 08/07/2022 0241   VLDL 12 08/07/2022 0241   LDLCALC 71 08/07/2022 0241    Physical Exam:    VS:  BP (!) 100/58 (BP Location: Right Arm, Patient Position: Sitting, Cuff Size: Normal)   Pulse (!) 43   Ht 6' (1.829 m)   Wt 136 lb (61.7 kg)   BMI 18.44 kg/m     Wt Readings from Last 3 Encounters:  01/25/23 136 lb (61.7 kg)  01/06/23 134 lb (60.8 kg)  12/29/22 139 lb (63 kg)     GEN:   in no acute distress HEENT: Normal NECK: No JVD; No carotid bruits CARDIAC: Irregular, bradycardic no murmurs, rubs, gallops RESPIRATORY:  Clear to auscultation without rales,  wheezing or rhonchi  ABDOMEN: Soft, non-tender, non-distended MUSCULOSKELETAL:  No edema; No deformity  SKIN: Warm and dry NEUROLOGIC:  Alert and oriented x 1.  Aphasia  PSYCHIATRIC:  Normal affect   ASSESSMENT:    1. Chronic combined systolic and diastolic heart failure ([place])   2. Atrial fibrillation, unspecified type ([place])   3. H/O: CVA (cerebrovascular accident)   4. Mitral valve insufficiency, unspecified etiology    PLAN:    Chronic combined systolic and diastolic heart failure: Echocardiogram during admission 07/2022 showed EF 35  to 40%.  Echocardiogram 01/11/2023 showed EF 20 to 25%, moderate RV dysfunction, small pericardial effusion, mild to moderate MR, mild AI, mild dilatation of aortic root measuring 40 mm.   -GDMT has been limited due to soft blood pressure.  Only on spironolactone 25 mg daily.  Decrease spironolactone to 12.5 mg daily and will add losartan 12.5 mg daily.  Check BMET. Next step will be to add SGLT2 inhibitor if tolerating.  Would hold off on beta-blocker given low resting heart rate -Not a good candidate for invasive evaluation, he is oriented to person only in clinic today.  Will plan medical management  Atrial fibrillation: Presented with A-fib with RVR and acute CVA 07/2022.  He was started on Cardizem drip initially and became bradycardic, had sinus pauses up to 4.7 seconds.  EP was consulted and recommended amiodarone to try and maintain sinus rhythm. -Continue Eliquis 5 mg twice daily -He is back in A-fib in clinic today and bradycardic to 40s.  Recommend discontinue amiodarone.  Check Zio patch x 3 days to evaluate rates  Hyperlipidemia: On atorvastatin 40 mg daily   Mitral regurgitation: Moderate MR on echocardiogram.  Will monitor  Acute CVA: He was admitted 07/2022 with CVA.  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  Found to have atrial fibrillation as above -Continue Eliquis, statin  RTC in 1 week    Medication  Adjustments/Labs and Tests Ordered: Current medicines are reviewed at length with the patient today.  Concerns regarding medicines are outlined above.  Orders Placed This Encounter  Procedures   Basic metabolic panel   LONG TERM MONITOR (3-14 DAYS)   EKG 12-Lead   Meds ordered this encounter  Medications   spironolactone (ALDACTONE) 25 MG tablet    Sig: Take 0.5 tablets (12.5 mg total) by mouth daily.    Dispense:  45 tablet    Refill:  3    Dose change   losartan (COZAAR) 25 MG tablet    Sig: Take 0.5 tablets (12.5 mg total) by mouth daily.    Dispense:  45 tablet    Refill:  3    Patient Instructions  Medication Instructions:  STOP amiodarone DECREASE spironolactone (Aldactone) to 12.5 mg daily START Losartan 12.5 mg daily  *If you need a refill on your cardiac medications before your next appointment, please call your pharmacy*   Lab Work: BMET today  If you have labs (blood work) drawn today and your tests are completely normal, you will receive your results only by: MyChart Message (if you have MyChart) OR A paper copy in the mail If you have any lab test that is abnormal or we need to change your treatment, we will call you to review the results.   Testing/Procedures: Christena Deem- Long Term Monitor Instructions   Your physician has requested you wear a ZIO patch monitor for _3_ days.  This is a single patch monitor.   [employer] supplies one patch monitor per enrollment. Additional stickers are not available. Please do not apply patch if you will be having a Nuclear Stress Test, Echocardiogram, Cardiac CT, MRI, or Chest Xray during the period you would be wearing the monitor. The patch cannot be worn during these tests. You cannot remove and re-apply the ZIO XT patch monitor.  Your ZIO patch monitor will be sent Fed Ex from [employer] directly to your home address. It may take 3-5 days to receive your monitor after you have been enrolled.  Once you have received  your  monitor, please review the enclosed instructions. Your monitor has already been registered assigning a specific monitor serial # to you.  Billing and Patient Assistance Program Information   We have supplied [employer] with any of your insurance information on file for billing purposes. [employer] offers a sliding scale Patient Assistance Program for patients that do not have insurance, or whose insurance does not completely cover the cost of the ZIO monitor.   You must apply for the Patient Assistance Program to qualify for this discounted rate.     To apply, please call [employer] at [phone], select option 4, then select option 2, and ask to apply for Patient Assistance Program.  Theodore Demark will ask your household income, and how many people are in your household.  They will quote your out-of-pocket cost based on that information.  [employer] will also be able to set up a 24-month, interest-free payment plan if needed.  Applying the monitor   Shave hair from upper left chest.  Hold abrader disc by orange tab. Rub abrader in 40 strokes over the upper left chest as indicated in your monitor instructions.  Clean area with 4 enclosed alcohol pads. Let dry.  Apply patch as indicated in monitor instructions. Patch will be placed under collarbone on left side of chest with arrow pointing upward.  Rub patch adhesive wings for 2 minutes. Remove white label marked "1". Remove the white label marked "2". Rub patch adhesive wings for 2 additional minutes.  While looking in a mirror, press and release button in center of patch. A small green light will flash 3-4 times. This will be your only indicator that the monitor has been turned on. ?  Do not shower for the first 24 hours. You may shower after the first 24 hours.  Press the button if you feel a symptom. You will hear a small click. Record Date, Time and Symptom in the Patient Logbook.  When you are ready to remove the patch, follow instructions on the last 2  pages of the Patient Logbook. Stick patch monitor onto the last page of Patient Logbook.  Place Patient Logbook in the blue and white box.  Use locking tab on box and tape box closed securely.  The blue and white box has prepaid postage on it. Please place it in the mailbox as soon as possible. Your physician should have your test results approximately 7 days after the monitor has been mailed back to [facility].  Call [facility] at [phone] if you have questions regarding your ZIO XT patch monitor. Call them immediately if you see an orange light blinking on your monitor.  If your monitor falls off in less than 4 days, contact our Monitor department at [phone]. ?If your monitor becomes loose or falls off after 4 days call [employer] at [phone] for suggestions on securing your monitor.?  Follow-Up: At [facility], you and your health needs are our priority.  As part of our continuing mission to provide you with exceptional heart care, we have created designated Provider Care Teams.  These Care Teams include your primary Cardiologist (physician) and Advanced Practice Providers (APPs -  Physician Assistants and Nurse Practitioners) who all work together to provide you with the care you need, when you need it.  We recommend signing up for the patient portal called "MyChart".  Sign up information is provided on this After Visit Summary.  MyChart is used to connect with patients for Virtual Visits (Telemedicine).  Patients are able to  view lab/test results, encounter notes, upcoming appointments, etc.  Non-urgent messages can be sent to your provider as well.   To learn more about what you can do with MyChart, go to [email or web address].    Your next appointment:   1 week(s)  Provider:   Donato Heinz, MD         Signed, Donato Heinz, MD  01/25/2023 12:18 PM    [place]

## 2023-01-25 ENCOUNTER — Ambulatory Visit (INDEPENDENT_AMBULATORY_CARE_PROVIDER_SITE_OTHER): Payer: Medicare HMO

## 2023-01-25 ENCOUNTER — Ambulatory Visit: Payer: Medicare HMO | Attending: Cardiology | Admitting: Cardiology

## 2023-01-25 ENCOUNTER — Encounter: Payer: Self-pay | Admitting: Cardiology

## 2023-01-25 VITALS — BP 100/58 | HR 43 | Ht 72.0 in | Wt 136.0 lb

## 2023-01-25 DIAGNOSIS — I340 Nonrheumatic mitral (valve) insufficiency: Secondary | ICD-10-CM

## 2023-01-25 DIAGNOSIS — I5042 Chronic combined systolic (congestive) and diastolic (congestive) heart failure: Secondary | ICD-10-CM

## 2023-01-25 DIAGNOSIS — Z8673 Personal history of transient ischemic attack (TIA), and cerebral infarction without residual deficits: Secondary | ICD-10-CM | POA: Diagnosis not present

## 2023-01-25 DIAGNOSIS — I4891 Unspecified atrial fibrillation: Secondary | ICD-10-CM

## 2023-01-25 MED ORDER — LOSARTAN POTASSIUM 25 MG PO TABS: 12.5000 mg | ORAL_TABLET | Freq: Every day | ORAL | 3 refills | Status: DC

## 2023-01-25 MED ORDER — SPIRONOLACTONE 25 MG PO TABS: 12.5000 mg | ORAL_TABLET | Freq: Every day | ORAL | 3 refills | Status: DC

## 2023-01-25 NOTE — Patient Instructions (Signed)
Medication Instructions:  STOP amiodarone DECREASE spironolactone (Aldactone) to 12.5 mg daily START Losartan 12.5 mg daily  *If you need a refill on your cardiac medications before your next appointment, please call your pharmacy*   Lab Work: BMET today  If you have labs (blood work) drawn today and your tests are completely normal, you will receive your results only by: [place] (if you have MyChart) OR A paper copy in the mail If you have any lab test that is abnormal or we need to change your treatment, we will call you to review the results.   Testing/Procedures: Bryn Gulling- Long Term Monitor Instructions   Your physician has requested you wear a ZIO patch monitor for _3_ days.  This is a single patch monitor.   [employer] supplies one patch monitor per enrollment. Additional stickers are not available. Please do not apply patch if you will be having a Nuclear Stress Test, Echocardiogram, Cardiac CT, MRI, or Chest Xray during the period you would be wearing the monitor. The patch cannot be worn during these tests. You cannot remove and re-apply the ZIO XT patch monitor.  Your ZIO patch monitor will be sent Fed Ex from [employer] directly to your home address. It may take 3-5 days to receive your monitor after you have been enrolled.  Once you have received your monitor, please review the enclosed instructions. Your monitor has already been registered assigning a specific monitor serial # to you.  Billing and Patient Assistance Program Information   We have supplied [employer] with any of your insurance information on file for billing purposes. [employer] offers a sliding scale Patient Assistance Program for patients that do not have insurance, or whose insurance does not completely cover the cost of the ZIO monitor.   You must apply for the Patient Assistance Program to qualify for this discounted rate.     To apply, please call [employer] at [phone], select option 4, then  select option 2, and ask to apply for Patient Assistance Program.  Theodore Demark will ask your household income, and how many people are in your household.  They will quote your out-of-pocket cost based on that information.  [employer] will also be able to set up a 55-month, interest-free payment plan if needed.  Applying the monitor   Shave hair from upper left chest.  Hold abrader disc by orange tab. Rub abrader in 40 strokes over the upper left chest as indicated in your monitor instructions.  Clean area with 4 enclosed alcohol pads. Let dry.  Apply patch as indicated in monitor instructions. Patch will be placed under collarbone on left side of chest with arrow pointing upward.  Rub patch adhesive wings for 2 minutes. Remove white label marked "1". Remove the white label marked "2". Rub patch adhesive wings for 2 additional minutes.  While looking in a mirror, press and release button in center of patch. A small green light will flash 3-4 times. This will be your only indicator that the monitor has been turned on. ?  Do not shower for the first 24 hours. You may shower after the first 24 hours.  Press the button if you feel a symptom. You will hear a small click. Record Date, Time and Symptom in the Patient Logbook.  When you are ready to remove the patch, follow instructions on the last 2 pages of the Patient Logbook. Stick patch monitor onto the last page of Patient Logbook.  Place Patient Logbook in the blue and white box.  Use locking tab on box and tape box closed securely.  The blue and white box has prepaid postage on it. Please place it in the mailbox as soon as possible. Your physician should have your test results approximately 7 days after the monitor has been mailed back to [facility].  Call [place] at [phone] if you have questions regarding your ZIO XT patch monitor. Call them immediately if you see an orange light blinking on your monitor.  If your monitor falls  off in less than 4 days, contact our Monitor department at [phone]. ?If your monitor becomes loose or falls off after 4 days call [employer] at [phone] for suggestions on securing your monitor.?  Follow-Up: At [facility], you and your health needs are our priority.  As part of our continuing mission to provide you with exceptional heart care, we have created designated Provider Care Teams.  These Care Teams include your primary Cardiologist (physician) and Advanced Practice Providers (APPs -  Physician Assistants and Nurse Practitioners) who all work together to provide you with the care you need, when you need it.  We recommend signing up for the patient portal called "MyChart".  Sign up information is provided on this After Visit Summary.  MyChart is used to connect with patients for Virtual Visits (Telemedicine).  Patients are able to view lab/test results, encounter notes, upcoming appointments, etc.  Non-urgent messages can be sent to your provider as well.   To learn more about what you can do with MyChart, go to [email or web address].    Your next appointment:   1 week(s)  Provider:   Donato Heinz, MD

## 2023-01-25 NOTE — Progress Notes (Unsigned)
[facility] [address] [place]. [place] [address] [phone]       STROKE FOLLOW UP NOTE  [name] Date of Birth:  [birth year] Medical Record Number:  [id number]    Primary neurologist: Dr. Leonie Man Reason for Referral: stroke follow up    SUBJECTIVE:   CHIEF COMPLAINT:  Chief Complaint  Patient presents with   Follow-up    RM 3 with spouse Dustin Blanchard Pt is well and stable, no new stroke concerns     HPI:   Update 01/26/2023 JM: Patient returns for 69-month stroke follow-up accompanied by his wife who provides history.  Reports residual speech impairment, dysphagia, right-sided weakness and visual impairment.  She does believe he has made some progress since prior visit.  Improvement of right-sided strength, now ambulates without assistive device, denies any recent falls.  Currently doing well on Dys 3 thin liquid diet. Speech still significantly limited but has been having some spontaneous speech more recently.  Wife tries to encourage him to do HEP routinely at home. Completed SLP back in December as he met maximize rehab potential. Completed PT/OT back in November with goals partially met and meeting maximize rehab potential. Continues on Seroquel and Depakene for mood stabilization which has been stable. Denies new stroke/TIA symptoms.   Compliant on Eliquis and atorvastatin Blood pressure well-controlled Routinely follows with PCP and cardiology     History provided for reference purposes only Initial visit 09/17/2022 JM: Patient being seen for hospital follow up accompanied by his wife who provides majority of history.  Reports continued right-sided weakness, aphasia, dysphagia and right-sided visual impairment.  Just recently started OT and SLP and recent initial PT evaluation, has follow-up visits today. Wife does note improvement since discharge.  Ambulating with RW, was not using before, denies any recent falls. Remains on pured diet with NTL.  No new stroke/TIA symptoms. Continues on Seroquel and Depakene for mood stabilization, does have some agitation in the evening but does improve after medications. Sleeping well.  Appetite satisfactory, currently on mirtazapine (started during CIR admission).   Compliant on Eliquis and atorvastatin, denies side effects Blood pressure today 116/62, does not routinely monitor at home Has since been seen by PCP and cardiology, PMR f/u visit next week  Stroke admission 08/06/2022 Dustin Blanchard is a 72 y.o. male with a past medical history significant for hypertension, hyperlipidemia, sinus bradycardia, BPH who presented to outside hospital after being found down at home on 08/06/2022.  He was found to have an acute left MCA stroke.  He was given TNK and transferred to [facility] for mechanical thrombectomy of left M2 occlusion, which was successful.  Punctate right ACA infarct was also seen on MRI.  Etiology likely due to newly diagnosed A-fib and was stared on Eliquis.  EF 35 to 40% with severe LA dilation.  LDL 71 -increase atorvastatin from 20 mg to 40 mg daily.  A1c 4.6.  Other stroke risk factors include advanced age and CHF.  No prior stroke history.  Hospital course complicated by cognitive impairment at baseline with delirium/sundowning, was placed on Seroquel and Depakote and advised to taper off meds once able.  He was transferred to [facility] for ongoing therapy needs.     PERTINENT IMAGING  CT showed no acute abnormality, old left occipital pole infarct.  Status post TNK.   CTA head and neck showed left M3 occlusion, bilateral siphon atherosclerosis with left moderate stenosis, left V4 stenosis.   CTP 0/123.   Status post IR with  left M3 TICI2c reperfusion.   Repeat CT showed left frontal infarct without significant hemorrhage.   MRI showed left MCA patchy infarct with mild hemorrhagic conversion and a punctate right ACA infarct.   EF 35 to 40%. Severe LA dilation, LDL 71 A1c 4.6  UDS negative.      ROS:   N/A d/t language impairment  PMH:  Past Medical History:  Diagnosis Date   Dementia ([place])     PSH:  Past Surgical History:  Procedure Laterality Date   HERNIA REPAIR     HIP SURGERY     IR CT HEAD LTD  08/06/2022   IR CT HEAD LTD  08/06/2022   IR PERCUTANEOUS ART THROMBECTOMY/INFUSION INTRACRANIAL INC DIAG ANGIO  08/06/2022   IR US GUIDE VASC ACCESS RIGHT  08/06/2022   RADIOLOGY WITH ANESTHESIA N/A 08/06/2022   Procedure: IR WITH ANESTHESIA;  Surgeon: Luanne Bras, MD;  Location: [place];  Service: Radiology;  Laterality: N/A;    Social History:  Social History   Socioeconomic History   Marital status: Married    Spouse name: Not on file   Number of children: Not on file   Years of education: Not on file   Highest education level: Not on file  Occupational History   Not on file  Tobacco Use   Smoking status: Former   Smokeless tobacco: Never  Vaping Use   Vaping Use: Never used  Substance and Sexual Activity   Alcohol use: No   Drug use: Not Currently   Sexual activity: Never  Other Topics Concern   Not on file  Social History Narrative   Not on file   Social Determinants of Health   Financial Resource Strain: Not on file  Food Insecurity: Not on file  Transportation Needs: Not on file  Physical Activity: Not on file  Stress: Not on file  Social Connections: Not on file  Intimate Partner Violence: Not on file    Family History: History reviewed. No pertinent family history.  Medications:   Current Outpatient Medications on File Prior to Visit  Medication Sig Dispense Refill   acetaminophen (TYLENOL) 325 MG tablet Take 2 tablets (650 mg total) by mouth every 4 (four) hours as needed for mild pain (or temp > 37.5 C (99.5 F)).     apixaban (ELIQUIS) 5 MG TABS tablet Take 1 tablet (5 mg total) by mouth 2 (two) times daily. 60 tablet 4   B Complex Vitamins (VITAMIN B COMPLEX PO)      cholecalciferol (VITAMIN D3) 10 MCG (400 UNIT) TABS tablet  Take 2 tablets (800 Units total) by mouth daily. 60 tablet 0   loperamide (IMODIUM A-D) 2 MG tablet Take 1 tablet (2 mg total) by mouth 4 (four) times daily as needed for diarrhea or loose stools. 30 tablet 0   losartan (COZAAR) 25 MG tablet Take 0.5 tablets (12.5 mg total) by mouth daily. 45 tablet 3   melatonin 3 MG TABS tablet Take 1 tablet (3 mg total) by mouth at bedtime. 30 tablet 0   mirtazapine (REMERON) 7.5 MG tablet Take 1 tablet (7.5 mg total) by mouth at bedtime. 90 tablet 3   polyethylene glycol (MIRALAX / GLYCOLAX) 17 g packet Take 17 g by mouth daily. 14 each 0   QUEtiapine (SEROQUEL) 25 MG tablet TAKE 1 TABLET (25 MG TOTAL) BY MOUTH DAILY WITH SUPPER. 90 tablet 4   RA ACETAMINOPHEN EX ST 500 MG tablet Take 1 tablet by mouth every six hours as  needed     spironolactone (ALDACTONE) 25 MG tablet Take 0.5 tablets (12.5 mg total) by mouth daily. 45 tablet 3   tamsulosin (FLOMAX) 0.4 MG CAPS capsule Take 1 capsule (0.4 mg total) by mouth daily. 30 capsule 0   valproic acid (DEPAKENE) 250 MG/5ML solution Take 5 mLs (250 mg total) by mouth at bedtime. 300 mL 5   No current facility-administered medications on file prior to visit.    Allergies:  No Known Allergies    OBJECTIVE:  Physical Exam  Vitals:   01/26/23 1404  BP: (!) 97/58  Pulse: 62  Weight: 137 lb (62.1 kg)  Height: 6' (1.829 m)   Body mass index is 18.58 kg/m. No results found.   General: well developed, well nourished, very pleasant elderly African-American male, seated, in no evident distress Head: head normocephalic and atraumatic.   Neck: supple with no carotid or supraclavicular bruits Cardiovascular: regular rate and rhythm, no murmurs Musculoskeletal: no deformity Skin:  no rash/petichiae Vascular:  Normal pulses all extremities   Neurologic Exam Mental Status: Awake and fully alert.  Severe expressive, no intelligible speech. Unable to appreciate receptive aphasia during visit.  Able to follow  commands without difficulty. Able to shake head yes/no appropriately.  Unable to fully assess orientation.  Mood and affect appropriate.  Cranial Nerves: Pupils equal, briskly reactive to light. Extraocular movements full without nystagmus. Visual fields right homonymous hemianopsia.Marland Kitchen Hearing intact. Facial sensation intact.  Right lower facial weakness.  Tongue, palate moves normally and symmetrically.  Motor: Normal bulk and tone. Normal strength in all tested extremity muscles except 4+/5 RUE weakness and mild right HF weakness Sensory.: intact to touch , pinprick , position and vibratory sensation.  Coordination: Rapid alternating movements normal in all extremities except decreased right hand.  Gait and Station: Arises from chair with mild difficulty. Stance is slightly hunched. Gait demonstrates slightly decreased stride length of RLE and mild unsteadiness without use of AD.  Tandem walking heel toe not attempted.   Reflexes: 1+ and symmetric. Toes downgoing.        ASSESSMENT: Festus Pursel is a 72 y.o. year old male left MCA patchy infarct and punctate right ACA infarct due to left M3 occlusion on 08/06/2022 s/p TNK and IR with TICI 2C reperfusion, embolic source likely due to newly diagnosed A-fib. Vascular risk factors include HTN, HLD, new dx of A fib, CHF, tachybradycardia syndrome, cognitive impairment at baseline and CHF.     PLAN:  Embolic stroke:  Residual deficit: Mild right-sided weakness, severe expressive, right sided visual loss, and dysphagia.  Some progress noted since prior visit.  Highly encouraged continued HEP , completed therapies as max rehab potential met.  Continue Depakene for mood stabilization.  Recommend trialing to gradually taper off Seroquel, advised wife to restart with any worsening behaviors.  Continue Eliquis 5mg  twice daily and atorvastatin (Lipitor) 40 mg daily for secondary stroke prevention.   Discussed secondary stroke prevention measures and  importance of close PCP follow up for aggressive stroke risk factor management including BP goal<130/90, HLD with LDL goal<70 and DM with A1c.<7 .  Stroke labs 07/2022: LDL 71, A1c 4.6 I have gone over the pathophysiology of stroke, warning signs and symptoms, risk factors and their management in some detail with instructions to go to the closest emergency room for symptoms of concern  Atrial fibrillation: Continue close follow-up with cardiology for atrial fibrillation and Eliquis management     Follow up in 6 months or call earlier if  needed   CC:  PCP: [facility], [employer]    I spent 34 minutes of face-to-face and non-face-to-face time with patient and wife.  This included previsit chart review, lab review, study review, order entry, electronic health record documentation, patient and wife education and discussion regarding above diagnoses and treatment plan and answered all questions to patient and wife satisfaction   Frann Rider, AGNP-BC  [facility] [address] [place], [place] [address]  Phone [phone] Fax [phone] Note: This document was prepared with digital dictation and possible smart phrase technology. Any transcriptional errors that result from this process are unintentional.

## 2023-01-25 NOTE — Progress Notes (Unsigned)
Enrolled for [employer] to mail a ZIO XT long term holter monitor to the patients address on file.  

## 2023-01-26 ENCOUNTER — Encounter: Payer: Self-pay | Admitting: Adult Health

## 2023-01-26 ENCOUNTER — Ambulatory Visit: Payer: Medicare HMO | Admitting: Adult Health

## 2023-01-26 VITALS — BP 97/58 | HR 62 | Ht 72.0 in | Wt 137.0 lb

## 2023-01-26 DIAGNOSIS — I69391 Dysphagia following cerebral infarction: Secondary | ICD-10-CM

## 2023-01-26 DIAGNOSIS — I69320 Aphasia following cerebral infarction: Secondary | ICD-10-CM

## 2023-01-26 DIAGNOSIS — I63512 Cerebral infarction due to unspecified occlusion or stenosis of left middle cerebral artery: Secondary | ICD-10-CM | POA: Diagnosis not present

## 2023-01-26 LAB — BASIC METABOLIC PANEL
BUN/Creatinine Ratio: 15 (ref 10–24)
BUN: 14 mg/dL (ref 8–27)
CO2: 25 mmol/L (ref 20–29)
Calcium: 9.5 mg/dL (ref 8.6–10.2)
Chloride: 105 mmol/L (ref 96–106)
Creatinine, Ser: 0.95 mg/dL (ref 0.76–1.27)
Glucose: 74 mg/dL (ref 70–99)
Potassium: 4.5 mmol/L (ref 3.5–5.2)
Sodium: 142 mmol/L (ref 134–144)
eGFR: 86 mL/min/{1.73_m2} (ref >59)

## 2023-01-26 NOTE — Patient Instructions (Signed)
Decrease Seroquel to 12.5mg  nightly over the next week and then try to discontinue. If behaviors worsen, please restart prior dosing and let us know  Continue Depakene 250mg  nightly for now  Continue  Eliquis   and atorvastatin for secondary stroke prevention  Continue to follow up with PCP regarding blood pressure and cholesterol management  Maintain strict control of hypertension with blood pressure goal below 130/90 and cholesterol with LDL cholesterol (bad cholesterol) goal below 70 mg/dL.   Signs of a Stroke? Follow the BEFAST method:  Balance Watch for a sudden loss of balance, trouble with coordination or vertigo Eyes Is there a sudden loss of vision in one or both eyes? Or double vision?  Face: Ask the person to smile. Does one side of the face droop or is it numb?  Arms: Ask the person to raise both arms. Does one arm drift downward? Is there weakness or numbness of a leg? Speech: Ask the person to repeat a simple phrase. Does the speech sound slurred/strange? Is the person confused ? Time: If you observe any of these signs, call 911.     Followup in the future with me in 6 months or call earlier if needed     Thank you for coming to see [place] at [facility]. I hope we have been able to provide you high quality care today.  You may receive a patient satisfaction survey over the next few weeks. We would appreciate your feedback and comments so that we may continue to improve ourselves and the health of our patients.

## 2023-01-28 DIAGNOSIS — I4891 Unspecified atrial fibrillation: Secondary | ICD-10-CM | POA: Diagnosis not present

## 2023-01-31 NOTE — Progress Notes (Signed)
Cardiology Office Note:    Date:  02/01/2023   ID:  Dustin Blanchard, DOB May 01, 1951, MRN [id number]  PCP:  [facility], [place]  Cardiologist:  Donato Heinz, MD  Electrophysiologist:  None   Referring MD: [facility], [place] Complaint  Patient presents with   Congestive Heart Failure    History of Present Illness:    Dustin Blanchard is a 72 y.o. male with a hx of chronic combined systolic and diastolic heart failure, CVA, atrial fibrillation, dementia who presents from follow-up.  He was admitted 07/2022 with CVA.  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  Cardiology was consulted for new onset atrial fibrillation with RVR and heart failure.  Echocardiogram showed EF 35-40%.  He was started on Cardizem drip initially and became bradycardic, had sinus pauses up to 4.7 seconds.  EP was consulted and recommended amiodarone to try and maintain sinus rhythm.  Echocardiogram 01/11/2023 showed EF 20 to 25%, moderate RV dysfunction, small pericardial effusion, mild to moderate MR, mild AI, mild dilatation of aortic root measuring 40 mm.    Since last clinic visit, his wife reports things have been going well.  No chest pain or dyspnea.  No bleeding issues on Eliquis   BP Readings from Last 3 Encounters:  02/01/23 92/64  01/26/23 (!) 97/58  01/25/23 (!) 100/58    Past Medical History:  Diagnosis Date   Dementia [facility])     Past Surgical History:  Procedure Laterality Date   HERNIA REPAIR     HIP SURGERY     IR CT HEAD LTD  08/06/2022   IR CT HEAD LTD  08/06/2022   IR PERCUTANEOUS ART THROMBECTOMY/INFUSION INTRACRANIAL INC DIAG ANGIO  08/06/2022   IR US GUIDE VASC ACCESS RIGHT  08/06/2022   RADIOLOGY WITH ANESTHESIA N/A 08/06/2022   Procedure: IR WITH ANESTHESIA;  Surgeon: Luanne Bras, MD;  Location: [place];  Service: Radiology;  Laterality: N/A;    Current Medications: Current Meds  Medication Sig   acetaminophen (TYLENOL) 325 MG  tablet Take 2 tablets (650 mg total) by mouth every 4 (four) hours as needed for mild pain (or temp > 37.5 C (99.5 F)).   apixaban (ELIQUIS) 5 MG TABS tablet Take 1 tablet (5 mg total) by mouth 2 (two) times daily.   B Complex Vitamins (VITAMIN B COMPLEX PO)    cholecalciferol (VITAMIN D3) 10 MCG (400 UNIT) TABS tablet Take 2 tablets (800 Units total) by mouth daily.   empagliflozin (JARDIANCE) 10 MG TABS tablet Take 1 tablet (10 mg total) by mouth daily before breakfast.   loperamide (IMODIUM A-D) 2 MG tablet Take 1 tablet (2 mg total) by mouth 4 (four) times daily as needed for diarrhea or loose stools.   losartan-hydrochlorothiazide (HYZAAR) 100-12.5 MG tablet Take 1 tablet by mouth daily.   melatonin 3 MG TABS tablet Take 1 tablet (3 mg total) by mouth at bedtime.   mirtazapine (REMERON) 7.5 MG tablet Take 1 tablet (7.5 mg total) by mouth at bedtime.   polyethylene glycol (MIRALAX / GLYCOLAX) 17 g packet Take 17 g by mouth daily.   QUEtiapine (SEROQUEL) 25 MG tablet TAKE 1 TABLET (25 MG TOTAL) BY MOUTH DAILY WITH SUPPER.   RA ACETAMINOPHEN EX ST 500 MG tablet Take 1 tablet by mouth every six hours as needed   tamsulosin (FLOMAX) 0.4 MG CAPS capsule Take 1 capsule (0.4 mg total) by mouth daily.   valproic acid (DEPAKENE) 250 MG/5ML solution Take  5 mLs (250 mg total) by mouth at bedtime.     Allergies:   Patient has no known allergies.   Social History   Socioeconomic History   Marital status: Married    Spouse name: Not on file   Number of children: Not on file   Years of education: Not on file   Highest education level: Not on file  Occupational History   Not on file  Tobacco Use   Smoking status: Former   Smokeless tobacco: Never  Vaping Use   Vaping Use: Never used  Substance and Sexual Activity   Alcohol use: No   Drug use: Not Currently   Sexual activity: Never  Other Topics Concern   Not on file  Social History Narrative   Not on file   Social Determinants of Health    Financial Resource Strain: Not on file  Food Insecurity: Not on file  Transportation Needs: Not on file  Physical Activity: Not on file  Stress: Not on file  Social Connections: Not on file     Family History: The patient's family history is not on file.  ROS:   Please see the history of present illness.     All other systems reviewed and are negative.  EKGs/Labs/Other Studies Reviewed:    The following studies were reviewed today:   EKG:   01/25/2023: Atrial fibrillation, rate 43 02/01/23: Atrial flutter, rate 71  Recent Labs: 08/12/2022: Magnesium 2.2 01/06/2023: ALT 13; Hemoglobin 14.0; Platelets 242 01/25/2023: BUN 14; Creatinine, Ser 0.95; Potassium 4.5; Sodium 142  Recent Lipid Panel    Component Value Date/Time   CHOL 130 08/07/2022 0241   TRIG 60 08/07/2022 0241   HDL 47 08/07/2022 0241   CHOLHDL 2.8 08/07/2022 0241   VLDL 12 08/07/2022 0241   LDLCALC 71 08/07/2022 0241    Physical Exam:    VS:  BP 92/64 (BP Location: Left Arm, Patient Position: Sitting, Cuff Size: Normal)   Pulse 71   Ht 6' (1.829 m)   Wt 140 lb 6.4 oz (63.7 kg)   SpO2 96%   BMI 19.04 kg/m     Wt Readings from Last 3 Encounters:  02/01/23 140 lb 6.4 oz (63.7 kg)  01/26/23 137 lb (62.1 kg)  01/25/23 136 lb (61.7 kg)     GEN:   in no acute distress HEENT: Normal NECK: No JVD; No carotid bruits CARDIAC: Irregular, bradycardic no murmurs, rubs, gallops RESPIRATORY:  Clear to auscultation without rales, wheezing or rhonchi  ABDOMEN: Soft, non-tender, non-distended MUSCULOSKELETAL:  No edema; No deformity  SKIN: Warm and dry NEUROLOGIC:  Alert and oriented x 1.  Aphasia  PSYCHIATRIC:  Normal affect   ASSESSMENT:    1. Chronic combined systolic and diastolic heart failure ([place])   2. Atrial fibrillation, unspecified type ([place])   3. Mitral valve insufficiency, unspecified etiology   4. Hyperlipidemia, unspecified hyperlipidemia type   5. H/O: CVA (cerebrovascular accident)      PLAN:    Chronic combined systolic and diastolic heart failure: Echocardiogram during admission 07/2022 showed EF 35 to 40%.  Echocardiogram 01/11/2023 showed EF 20 to 25%, moderate RV dysfunction, small pericardial effusion, mild to moderate MR, mild AI, mild dilatation of aortic root measuring 40 mm.   -GDMT has been limited due to soft blood pressure.  Currently on spironolactone 12.5 mg daily and losartan 12.5 mg daily.  Check BMET.  Add Jardiance 10 mg daily.  Would hold off on beta-blocker given low resting heart rate -Not  a good candidate for invasive evaluation, he is oriented to person only.  Will plan medical management  Atrial fibrillation: Presented with A-fib with RVR and acute CVA 07/2022.  He was started on Cardizem drip initially and became bradycardic, had sinus pauses up to 4.7 seconds.  EP was consulted and recommended amiodarone to try and maintain sinus rhythm. -Continue Eliquis 5 mg twice daily -At clinic visit 01/25/2023, was back in A-fib and bradycardic to 40s.  Recommend discontinue amiodarone.  Check Zio patch x 3 days to evaluate rates.  In atrial flutter with rate 71 in clinic today  Hyperlipidemia: On atorvastatin 40 mg daily   Mitral regurgitation: Moderate MR on echocardiogram.  Will monitor  Acute CVA: He was admitted 07/2022 with CVA.  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  Found to have atrial fibrillation as above -Continue Eliquis, statin   RTC in 1 week    Medication Adjustments/Labs and Tests Ordered: Current medicines are reviewed at length with the patient today.  Concerns regarding medicines are outlined above.  Orders Placed This Encounter  Procedures   Basic Metabolic Panel (BMET)   Basic Metabolic Panel (BMET)   Magnesium   EKG 12-Lead   Meds ordered this encounter  Medications   empagliflozin (JARDIANCE) 10 MG TABS tablet    Sig: Take 1 tablet (10 mg total) by mouth daily before breakfast.    Dispense:  30 tablet     Refill:  11    There are no Patient Instructions on file for this visit.   Signed, Donato Heinz, MD  02/01/2023 11:49 AM    [place]

## 2023-02-01 ENCOUNTER — Encounter: Payer: Self-pay | Admitting: Cardiology

## 2023-02-01 ENCOUNTER — Ambulatory Visit: Payer: Medicare HMO | Attending: Cardiology | Admitting: Cardiology

## 2023-02-01 VITALS — BP 92/64 | HR 71 | Ht 72.0 in | Wt 140.4 lb

## 2023-02-01 DIAGNOSIS — I5042 Chronic combined systolic (congestive) and diastolic (congestive) heart failure: Secondary | ICD-10-CM | POA: Diagnosis not present

## 2023-02-01 DIAGNOSIS — Z8673 Personal history of transient ischemic attack (TIA), and cerebral infarction without residual deficits: Secondary | ICD-10-CM

## 2023-02-01 DIAGNOSIS — I340 Nonrheumatic mitral (valve) insufficiency: Secondary | ICD-10-CM

## 2023-02-01 DIAGNOSIS — E785 Hyperlipidemia, unspecified: Secondary | ICD-10-CM

## 2023-02-01 DIAGNOSIS — I4891 Unspecified atrial fibrillation: Secondary | ICD-10-CM | POA: Diagnosis not present

## 2023-02-01 MED ORDER — EMPAGLIFLOZIN 10 MG PO TABS: 10.0000 mg | ORAL_TABLET | Freq: Every day | ORAL | 11 refills | Status: DC

## 2023-02-01 NOTE — Patient Instructions (Signed)
Medication Instructions:   START JARDIANCE 10 MG ONCE DAILY  *If you need a refill on your cardiac medications before your next appointment, please call your pharmacy*   Lab Work:  Your physician recommends that you return for lab work in: ONE WEEK-DO NOT NEED TO FAST  If you have labs (blood work) drawn today and your tests are completely normal, you will receive your results only by: [place] (if you have MyChart) OR A paper copy in the mail If you have any lab test that is abnormal or we need to change your treatment, we will call you to review the results.   Follow-Up: At [facility], you and your health needs are our priority.  As part of our continuing mission to provide you with exceptional heart care, we have created designated Provider Care Teams.  These Care Teams include your primary Cardiologist (physician) and Advanced Practice Providers (APPs -  Physician Assistants and Nurse Practitioners) who all work together to provide you with the care you need, when you need it.  We recommend signing up for the patient portal called "MyChart".  Sign up information is provided on this After Visit Summary.  MyChart is used to connect with patients for Virtual Visits (Telemedicine).  Patients are able to view lab/test results, encounter notes, upcoming appointments, etc.  Non-urgent messages can be sent to your provider as well.   To learn more about what you can do with MyChart, go to [email or web address].    Your next appointment:   1 month(s)  Provider:   Donato Heinz, MD

## 2023-02-02 LAB — BASIC METABOLIC PANEL
BUN/Creatinine Ratio: 12 (ref 10–24)
BUN: 14 mg/dL (ref 8–27)
CO2: 28 mmol/L (ref 20–29)
Calcium: 9 mg/dL (ref 8.6–10.2)
Chloride: 107 mmol/L — ABNORMAL HIGH (ref 96–106)
Creatinine, Ser: 1.16 mg/dL (ref 0.76–1.27)
Glucose: 72 mg/dL (ref 70–99)
Potassium: 4.7 mmol/L (ref 3.5–5.2)
Sodium: 144 mmol/L (ref 134–144)
eGFR: 67 mL/min/{1.73_m2} (ref >59)

## 2023-02-05 ENCOUNTER — Telehealth: Payer: Self-pay | Admitting: Cardiology

## 2023-02-05 NOTE — Telephone Encounter (Signed)
Angela Nevin called to report abnormal zio. They report on 01/28/23 the patient had slow atrial flutter @ 40 bpm for 60 sec. The report is available in EPIC. Will make dr Gardiner Rhyme aware.

## 2023-02-05 NOTE — Telephone Encounter (Signed)
Carla with [employer] calling with abnormal zio results

## 2023-02-07 NOTE — Telephone Encounter (Signed)
See result note, recommend EP referral

## 2023-02-09 LAB — BASIC METABOLIC PANEL
BUN/Creatinine Ratio: 14 (ref 10–24)
BUN: 12 mg/dL (ref 8–27)
CO2: 26 mmol/L (ref 20–29)
Calcium: 9.3 mg/dL (ref 8.6–10.2)
Chloride: 105 mmol/L (ref 96–106)
Creatinine, Ser: 0.88 mg/dL (ref 0.76–1.27)
Glucose: 76 mg/dL (ref 70–99)
Potassium: 4.9 mmol/L (ref 3.5–5.2)
Sodium: 142 mmol/L (ref 134–144)
eGFR: 92 mL/min/{1.73_m2} (ref >59)

## 2023-02-09 LAB — MAGNESIUM: Magnesium: 2.2 mg/dL (ref 1.6–2.3)

## 2023-02-10 ENCOUNTER — Other Ambulatory Visit: Payer: Self-pay | Admitting: General Practice

## 2023-02-12 ENCOUNTER — Telehealth: Payer: Self-pay | Admitting: Cardiology

## 2023-02-12 DIAGNOSIS — I455 Other specified heart block: Secondary | ICD-10-CM

## 2023-02-12 NOTE — Telephone Encounter (Signed)
Pt is calling in regards to results. Requesting return call.

## 2023-02-12 NOTE — Telephone Encounter (Signed)
Left message to call back  

## 2023-02-12 NOTE — Telephone Encounter (Signed)
Donato Heinz, MD 02/07/2023  9:57 PM EST     Numerous pauses, recommend referral to EP    Spoke to wife-aware of results and verbalized understanding.   Referral placed.

## 2023-02-17 ENCOUNTER — Other Ambulatory Visit: Payer: Self-pay | Admitting: General Practice

## 2023-02-17 DIAGNOSIS — I4891 Unspecified atrial fibrillation: Secondary | ICD-10-CM

## 2023-02-18 NOTE — Telephone Encounter (Signed)
Prescription refill request for Eliquis received. Indication: Afib  Last office visit: 02/01/23 Gardiner Rhyme)  Scr: 0.88 (02/08/23)  Age: 72 Weight: 63.7kg  Appropriate dose. Refill sent.

## 2023-03-01 ENCOUNTER — Other Ambulatory Visit: Payer: Self-pay | Admitting: General Practice

## 2023-03-01 NOTE — Progress Notes (Unsigned)
Cardiology Office Note:    Date:  03/02/2023   ID:  Dustin Blanchard, DOB June 09, 1951, MRN [id number]  PCP:  [facility], [place]  Cardiologist:  Donato Heinz, MD  Electrophysiologist:  None   Referring MD: [facility], [place]*   No chief complaint on file.   History of Present Illness:    Dustin Blanchard is a 72 y.o. male with a hx of chronic combined systolic and diastolic heart failure, CVA, atrial fibrillation, dementia who presents from follow-up.  He was admitted 07/2022 with CVA.  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  Cardiology was consulted for new onset atrial fibrillation with RVR and heart failure.  Echocardiogram showed EF 35-40%.  He was started on Cardizem drip initially and became bradycardic, had sinus pauses up to 4.7 seconds.  EP was consulted and recommended amiodarone to try and maintain sinus rhythm.  Echocardiogram 01/11/2023 showed EF 20 to 25%, moderate RV dysfunction, small pericardial effusion, mild to moderate MR, mild AI, mild dilatation of aortic root measuring 40 mm.  Zio patch x 3 days 01/2023 showed heart presented to flutter burden, average rate 67 bpm; 17 pauses with longest lasting 4 seconds.  Since last clinic visit, wife reports seems more tired.  Denies any chest pain.  Seems short of breath with walking to office.  No bleeding on Eliquis.  Denies any lightheadedness.   BP Readings from Last 3 Encounters:  02/01/23 92/64  01/26/23 (!) 97/58  01/25/23 (!) 100/58    Past Medical History:  Diagnosis Date   Dementia [facility])     Past Surgical History:  Procedure Laterality Date   HERNIA REPAIR     HIP SURGERY     IR CT HEAD LTD  08/06/2022   IR CT HEAD LTD  08/06/2022   IR PERCUTANEOUS ART THROMBECTOMY/INFUSION INTRACRANIAL INC DIAG ANGIO  08/06/2022   IR US GUIDE VASC ACCESS RIGHT  08/06/2022   RADIOLOGY WITH ANESTHESIA N/A 08/06/2022   Procedure: IR WITH ANESTHESIA;  Surgeon: Luanne Bras, MD;   Location: [facility];  Service: Radiology;  Laterality: N/A;    Current Medications: Current Meds  Medication Sig   acetaminophen (TYLENOL) 325 MG tablet Take 2 tablets (650 mg total) by mouth every 4 (four) hours as needed for mild pain (or temp > 37.5 C (99.5 F)).   apixaban (ELIQUIS) 5 MG TABS tablet TAKE 1 TABLET BY MOUTH TWICE A DAY   atorvastatin (LIPITOR) 20 MG tablet Take 20 mg by mouth daily.   B Complex Vitamins (VITAMIN B COMPLEX PO)    cholecalciferol (VITAMIN D3) 10 MCG (400 UNIT) TABS tablet Take 2 tablets (800 Units total) by mouth daily.   empagliflozin (JARDIANCE) 10 MG TABS tablet Take 1 tablet (10 mg total) by mouth daily before breakfast.   loperamide (IMODIUM A-D) 2 MG tablet Take 1 tablet (2 mg total) by mouth 4 (four) times daily as needed for diarrhea or loose stools.   melatonin 3 MG TABS tablet Take 1 tablet (3 mg total) by mouth at bedtime.   mirtazapine (REMERON) 7.5 MG tablet Take 1 tablet (7.5 mg total) by mouth at bedtime.   polyethylene glycol (MIRALAX / GLYCOLAX) 17 g packet Take 17 g by mouth daily.   QUEtiapine (SEROQUEL) 25 MG tablet TAKE 1 TABLET (25 MG TOTAL) BY MOUTH DAILY WITH SUPPER. (Patient taking differently: Take 25 mg by mouth as needed.)   RA ACETAMINOPHEN EX ST 500 MG tablet Take 1 tablet by mouth every six  hours as needed   tamsulosin (FLOMAX) 0.4 MG CAPS capsule Take 1 capsule (0.4 mg total) by mouth daily.   valproic acid (DEPAKENE) 250 MG/5ML solution Take 5 mLs (250 mg total) by mouth at bedtime.   [DISCONTINUED] losartan-hydrochlorothiazide (HYZAAR) 100-12.5 MG tablet Take 0.5 tablets by mouth daily. Half 1/2 Tablet Daily     Allergies:   Patient has no known allergies.   Social History   Socioeconomic History   Marital status: Married    Spouse name: Not on file   Number of children: Not on file   Years of education: Not on file   Highest education level: Not on file  Occupational History   Not on file  Tobacco Use   Smoking  status: Former   Smokeless tobacco: Never  Vaping Use   Vaping Use: Never used  Substance and Sexual Activity   Alcohol use: No   Drug use: Not Currently   Sexual activity: Never  Other Topics Concern   Not on file  Social History Narrative   Not on file   Social Determinants of Health   Financial Resource Strain: Not on file  Food Insecurity: Not on file  Transportation Needs: Not on file  Physical Activity: Not on file  Stress: Not on file  Social Connections: Not on file     Family History: The patient's family history is not on file.  ROS:   Please see the history of present illness.     All other systems reviewed and are negative.  EKGs/Labs/Other Studies Reviewed:    The following studies were reviewed today:   EKG:   01/25/2023: Atrial fibrillation, rate 43 02/01/23: Atrial flutter, rate 71  Recent Labs: 01/06/2023: ALT 13; Hemoglobin 14.0; Platelets 242 02/08/2023: BUN 12; Creatinine, Ser 0.88; Magnesium 2.2; Potassium 4.9; Sodium 142  Recent Lipid Panel    Component Value Date/Time   CHOL 130 08/07/2022 0241   TRIG 60 08/07/2022 0241   HDL 47 08/07/2022 0241   CHOLHDL 2.8 08/07/2022 0241   VLDL 12 08/07/2022 0241   LDLCALC 71 08/07/2022 0241    Physical Exam:    VS:  Ht 6' (1.829 m)   Wt 135 lb 6.4 oz (61.4 kg)   SpO2 94%   BMI 18.36 kg/m     Wt Readings from Last 3 Encounters:  03/02/23 135 lb 6.4 oz (61.4 kg)  02/01/23 140 lb 6.4 oz (63.7 kg)  01/26/23 137 lb (62.1 kg)     GEN:   in no acute distress HEENT: Normal NECK: No JVD; No carotid bruits CARDIAC: Irregular, bradycardic no murmurs, rubs, gallops RESPIRATORY:  Clear to auscultation without rales, wheezing or rhonchi  ABDOMEN: Soft, non-tender, non-distended MUSCULOSKELETAL:  No edema; No deformity  SKIN: Warm and dry NEUROLOGIC:  Alert and oriented x 1.  Aphasia  PSYCHIATRIC:  Normal affect   ASSESSMENT:    1. Hyperlipidemia, unspecified hyperlipidemia type   2. H/O: CVA  (cerebrovascular accident)   3. Chronic combined systolic and diastolic heart failure ([place])   4. Medication management   5. On anticoagulant therapy   6. Atrial fibrillation, unspecified type ([place])      PLAN:    Chronic combined systolic and diastolic heart failure: Echocardiogram during admission 07/2022 showed EF 35 to 40%.  Echocardiogram 01/11/2023 showed EF 20 to 25%, moderate RV dysfunction, small pericardial effusion, mild to moderate MR, mild AI, mild dilatation of aortic root measuring 40 mm.   -GDMT has been limited due to soft blood  pressure.  Currently on spironolactone 12.5 mg daily and losartan 12.5 mg daily and Jardiance 10 mg daily.  Would hold off on beta-blocker given low resting heart rate.  BP 90/60 in clinic today, on recheck SBP 80s.  Recommend holding losartan and spironolactone for now.  Asked to check BP twice daily for next week and let us know results.  Check BMET, magnesium -Not a good candidate for invasive evaluation, he is oriented to person only.  Will plan medical management  Atrial fibrillation: Presented with A-fib with RVR and acute CVA 07/2022.  He was started on Cardizem drip initially and became bradycardic, had sinus pauses up to 4.7 seconds.  EP was consulted and recommended amiodarone to try and maintain sinus rhythm. -Continue Eliquis 5 mg twice daily -At clinic visit 01/25/2023, was back in A-fib and bradycardic to 40s.  Discontinued amiodarone.  Zio patch x 3 days 01/2023 showed heart presented to flutter burden, average rate 67 bpm; 17 pauses with longest lasting 4 seconds.  Referred to EP  Hyperlipidemia: On atorvastatin 20 mg daily.  Will check lipid panel  Mitral regurgitation: Moderate MR on echocardiogram.  Will monitor  Acute CVA: He was admitted 07/2022 with CVA.  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  Found to have atrial fibrillation as above -Continue Eliquis, statin   RTC in 3 months    Medication Adjustments/Labs  and Tests Ordered: Current medicines are reviewed at length with the patient today.  Concerns regarding medicines are outlined above.  Orders Placed This Encounter  Procedures   Magnesium   CBC   Lipid panel   Basic metabolic panel   EKG [id number]   No orders of the defined types were placed in this encounter.   Patient Instructions  Medication Instructions:  STOP losartan and spironolactone   *If you need a refill on your cardiac medications before your next appointment, please call your pharmacy*   Lab Work: BMET, Magnesium, CBC, lipid panel today   If you have labs (blood work) drawn today and your tests are completely normal, you will receive your results only by: [place] (if you have MyChart) OR A paper copy in the mail If you have any lab test that is abnormal or we need to change your treatment, we will call you to review the results.    Follow-Up: At [facility], you and your health needs are our priority.  As part of our continuing mission to provide you with exceptional heart care, we have created designated Provider Care Teams.  These Care Teams include your primary Cardiologist (physician) and Advanced Practice Providers (APPs -  Physician Assistants and Nurse Practitioners) who all work together to provide you with the care you need, when you need it.  We recommend signing up for the patient portal called "MyChart".  Sign up information is provided on this After Visit Summary.  MyChart is used to connect with patients for Virtual Visits (Telemedicine).  Patients are able to view lab/test results, encounter notes, upcoming appointments, etc.  Non-urgent messages can be sent to your provider as well.   To learn more about what you can do with MyChart, go to [email or web address].    Your next appointment:    June 28 as scheduled with Dr. Gardiner Rhyme  Other Instructions  Call [employer] to see if a Blood Pressure Cuff is covered.  Dr. Gardiner Rhyme  recommends an OMRON upper arm BP cuff     Signed, Donato Heinz, MD  03/02/2023  3:15 PM    [facility]

## 2023-03-02 ENCOUNTER — Telehealth: Payer: Self-pay | Admitting: Cardiology

## 2023-03-02 ENCOUNTER — Ambulatory Visit: Payer: Medicare HMO | Attending: Cardiology | Admitting: Cardiology

## 2023-03-02 ENCOUNTER — Encounter: Payer: Self-pay | Admitting: Cardiology

## 2023-03-02 VITALS — Ht 72.0 in | Wt 135.4 lb

## 2023-03-02 DIAGNOSIS — Z7901 Long term (current) use of anticoagulants: Secondary | ICD-10-CM

## 2023-03-02 DIAGNOSIS — I4891 Unspecified atrial fibrillation: Secondary | ICD-10-CM

## 2023-03-02 DIAGNOSIS — Z79899 Other long term (current) drug therapy: Secondary | ICD-10-CM

## 2023-03-02 DIAGNOSIS — I5042 Chronic combined systolic (congestive) and diastolic (congestive) heart failure: Secondary | ICD-10-CM

## 2023-03-02 DIAGNOSIS — E785 Hyperlipidemia, unspecified: Secondary | ICD-10-CM

## 2023-03-02 DIAGNOSIS — Z8673 Personal history of transient ischemic attack (TIA), and cerebral infarction without residual deficits: Secondary | ICD-10-CM | POA: Diagnosis not present

## 2023-03-02 NOTE — Patient Instructions (Addendum)
Medication Instructions:  STOP losartan and spironolactone   *If you need a refill on your cardiac medications before your next appointment, please call your pharmacy*   Lab Work: BMET, Magnesium, CBC, lipid panel today   If you have labs (blood work) drawn today and your tests are completely normal, you will receive your results only by: [place] (if you have MyChart) OR A paper copy in the mail If you have any lab test that is abnormal or we need to change your treatment, we will call you to review the results.    Follow-Up: At [facility], you and your health needs are our priority.  As part of our continuing mission to provide you with exceptional heart care, we have created designated Provider Care Teams.  These Care Teams include your primary Cardiologist (physician) and Advanced Practice Providers (APPs -  Physician Assistants and Nurse Practitioners) who all work together to provide you with the care you need, when you need it.  We recommend signing up for the patient portal called "MyChart".  Sign up information is provided on this After Visit Summary.  MyChart is used to connect with patients for Virtual Visits (Telemedicine).  Patients are able to view lab/test results, encounter notes, upcoming appointments, etc.  Non-urgent messages can be sent to your provider as well.   To learn more about what you can do with MyChart, go to [email or web address].    Your next appointment:    June 28 as scheduled with Dr. Gardiner Rhyme  Other Instructions  Call [employer] to see if a Blood Pressure Cuff is covered.  Dr. Gardiner Rhyme recommends an OMRON upper arm BP cuff

## 2023-03-02 NOTE — Telephone Encounter (Signed)
Pt wife called to inform RN about discussion they had at [facility] today. She states insurance company will cover a bp machine anytime after 03/22/23

## 2023-03-03 ENCOUNTER — Encounter: Payer: Self-pay | Admitting: Cardiology

## 2023-03-03 NOTE — Telephone Encounter (Signed)
See my chart message

## 2023-03-03 NOTE — Telephone Encounter (Signed)
Recommend obtaining BP monitor once insurance covers and check BP twice daily for 1 week and let us know results

## 2023-03-05 LAB — LIPID PANEL
Chol/HDL Ratio: 2.5 ratio (ref 0.0–5.0)
Cholesterol, Total: 130 mg/dL (ref 100–199)
HDL: 51 mg/dL (ref >39)
LDL Chol Calc (NIH): 62 mg/dL (ref 0–99)
Triglycerides: 86 mg/dL (ref 0–149)
VLDL Cholesterol Cal: 17 mg/dL (ref 5–40)

## 2023-03-05 LAB — CBC
Hematocrit: 42.1 % (ref 37.5–51.0)
Hemoglobin: 14.2 g/dL (ref 13.0–17.7)
MCH: 32.6 pg (ref 26.6–33.0)
MCHC: 33.7 g/dL (ref 31.5–35.7)
MCV: 97 fL (ref 79–97)
Platelets: 244 10*3/uL (ref 150–450)
RBC: 4.35 x10E6/uL (ref 4.14–5.80)
RDW: 12.2 % (ref 11.6–15.4)
WBC: 4.6 10*3/uL (ref 3.4–10.8)

## 2023-03-05 LAB — BASIC METABOLIC PANEL
BUN/Creatinine Ratio: 16 (ref 10–24)
BUN: 16 mg/dL (ref 8–27)
CO2: 25 mmol/L (ref 20–29)
Calcium: 9.4 mg/dL (ref 8.6–10.2)
Chloride: 106 mmol/L (ref 96–106)
Creatinine, Ser: 1 mg/dL (ref 0.76–1.27)
Glucose: 89 mg/dL (ref 70–99)
Potassium: 4.5 mmol/L (ref 3.5–5.2)
Sodium: 143 mmol/L (ref 134–144)
eGFR: 80 mL/min/{1.73_m2} (ref >59)

## 2023-03-05 LAB — MAGNESIUM: Magnesium: 2.3 mg/dL (ref 1.6–2.3)

## 2023-03-12 ENCOUNTER — Encounter: Payer: Self-pay | Admitting: Cardiology

## 2023-03-22 ENCOUNTER — Ambulatory Visit: Payer: Medicare HMO | Attending: Internal Medicine | Admitting: Internal Medicine

## 2023-03-22 ENCOUNTER — Encounter: Payer: Self-pay | Admitting: Internal Medicine

## 2023-03-22 VITALS — BP 108/70 | HR 73 | Ht 72.0 in | Wt 143.4 lb

## 2023-03-22 DIAGNOSIS — I428 Other cardiomyopathies: Secondary | ICD-10-CM | POA: Diagnosis not present

## 2023-03-22 DIAGNOSIS — I455 Other specified heart block: Secondary | ICD-10-CM

## 2023-03-22 DIAGNOSIS — I4892 Unspecified atrial flutter: Secondary | ICD-10-CM | POA: Diagnosis not present

## 2023-03-22 NOTE — Patient Instructions (Signed)
Medication Instructions:  Your physician recommends that you continue on your current medications as directed. Please refer to the Current Medication list given to you today.  *If you need a refill on your cardiac medications before your next appointment, please call your pharmacy*   Lab Work: None ordered.  If you have labs (blood work) drawn today and your tests are completely normal, you will receive your results only by: [place] (if you have MyChart) OR A paper copy in the mail If you have any lab test that is abnormal or we need to change your treatment, we will call you to review the results.   Testing/Procedures: None ordered.    Follow-Up: At [facility], you and your health needs are our priority.  As part of our continuing mission to provide you with exceptional heart care, we have created designated Provider Care Teams.  These Care Teams include your primary Cardiologist (physician) and Advanced Practice Providers (APPs -  Physician Assistants and Nurse Practitioners) who all work together to provide you with the care you need, when you need it.  We recommend signing up for the patient portal called "MyChart".  Sign up information is provided on this After Visit Summary.  MyChart is used to connect with patients for Virtual Visits (Telemedicine).  Patients are able to view lab/test results, encounter notes, upcoming appointments, etc.  Non-urgent messages can be sent to your provider as well.   To learn more about what you can do with MyChart, go to [email or web address].    Your next appointment:   3 months with Lily Kocher

## 2023-03-22 NOTE — Progress Notes (Signed)
ELECTROPHYSIOLOGY CONSULT NOTE  Patient ID: Dustin Blanchard, MRN: [id number], DOB/AGE: February 29, 1951 72 y.o. Admit date: (Not on file) Date of Consult: 03/22/2023  Primary Physician: Rubbie Battiest, MD Primary Cardiologist: CSch      Dustin Blanchard is a 72 y.o. male who is being seen today for the evaluation of atrial flutter with pause at the request of [place] .    HPI Dustin Blanchard is a 72 y.o. male seen for pauses noted on the monitor in the setting of atrial flutter which has become persistent   8: /23 presented with a stroke from which he underwent thrombectomy of an occluded left MCA branch While in hospital, he was noted to have posttermination pauses and the hope was that if he had either sustained sinus rhythm or sustained atrial arrhythmias, that this would become moot.  Recent event recorder demonstrated persistent atrial flutter with variable rates 29-137 with pauses greater than 4 seconds at variable times during the day mostly occurring during the lowest heart rate ranges i.e. sleeping but 1 occurred in the mid morning where the average heart rate was intermediate between the very low and the higher 6-hour running average.   He is largely aphasic.  He wants to work in his yard and his wife is concerned about how much freedom to give him in this regard.  No swelling.  Dyspnea with exertion.  Blood pressure has limited guideline directed therapy DATE TEST EF   8/23 Echo   35-40 %   2/24 Echo   20-25 % Biventricular dysfunction and enlargement        Date Cr K Hgb  3/24 1.0 4.5 14.2          Thromboembolic risk factors ( age -77, HTN-1, TIA/CVA-2, CHF-1) for a CHADSVASc Score of >=5   Past Medical History:  Diagnosis Date   Dementia       Surgical History:  Past Surgical History:  Procedure Laterality Date   HERNIA REPAIR     HIP SURGERY     IR CT HEAD LTD  08/06/2022   IR CT HEAD LTD  08/06/2022   IR PERCUTANEOUS ART THROMBECTOMY/INFUSION INTRACRANIAL INC  DIAG ANGIO  08/06/2022   IR US GUIDE VASC ACCESS RIGHT  08/06/2022   RADIOLOGY WITH ANESTHESIA N/A 08/06/2022   Procedure: IR WITH ANESTHESIA;  Surgeon: Luanne Bras, MD;  Location: [facility];  Service: Radiology;  Laterality: N/A;     Home Meds: Current Meds  Medication Sig   acetaminophen (TYLENOL) 325 MG tablet Take 2 tablets (650 mg total) by mouth every 4 (four) hours as needed for mild pain (or temp > 37.5 C (99.5 F)).   apixaban (ELIQUIS) 5 MG TABS tablet TAKE 1 TABLET BY MOUTH TWICE A DAY   atorvastatin (LIPITOR) 20 MG tablet Take 20 mg by mouth daily.   B Complex Vitamins (VITAMIN B COMPLEX PO)    cholecalciferol (VITAMIN D3) 10 MCG (400 UNIT) TABS tablet Take 2 tablets (800 Units total) by mouth daily.   empagliflozin (JARDIANCE) 10 MG TABS tablet Take 1 tablet (10 mg total) by mouth daily before breakfast.   loperamide (IMODIUM A-D) 2 MG tablet Take 1 tablet (2 mg total) by mouth 4 (four) times daily as needed for diarrhea or loose stools.   melatonin 3 MG TABS tablet Take 1 tablet (3 mg total) by mouth at bedtime.   mirtazapine (REMERON) 7.5 MG tablet Take 1 tablet (7.5 mg total) by mouth at bedtime.  polyethylene glycol (MIRALAX / GLYCOLAX) 17 g packet Take 17 g by mouth daily.   QUEtiapine (SEROQUEL) 25 MG tablet TAKE 1 TABLET (25 MG TOTAL) BY MOUTH DAILY WITH SUPPER. (Patient taking differently: Take 25 mg by mouth as needed.)   RA ACETAMINOPHEN EX ST 500 MG tablet Take 1 tablet by mouth every six hours as needed   tamsulosin (FLOMAX) 0.4 MG CAPS capsule Take 1 capsule (0.4 mg total) by mouth daily.   valproic acid (DEPAKENE) 250 MG/5ML solution Take 5 mLs (250 mg total) by mouth at bedtime.    Allergies: No Known Allergies  Social History   Socioeconomic History   Marital status: Married    Spouse name: Not on file   Number of children: Not on file   Years of education: Not on file   Highest education level: Not on file  Occupational History   Not on file  Tobacco  Use   Smoking status: Former   Smokeless tobacco: Never  Vaping Use   Vaping Use: Never used  Substance and Sexual Activity   Alcohol use: No   Drug use: Not Currently   Sexual activity: Never  Other Topics Concern   Not on file  Social History Narrative   Not on file   Social Determinants of Health   Financial Resource Strain: Not on file  Food Insecurity: Not on file  Transportation Needs: Not on file  Physical Activity: Not on file  Stress: Not on file  Social Connections: Not on file  Intimate Partner Violence: Not on file     History reviewed. No pertinent family history.   ROS:  Please see the history of present illness.     All other systems reviewed and negative.    Physical Exam: Blood pressure 108/70, pulse 73, height 6' (1.829 m), weight 143 lb 6.4 oz (65 kg), SpO2 95 %. General: Well developed, well nourished male in no acute distress. Head: Normocephalic, atraumatic, sclera non-icteric, no xanthomas, nares are without discharge. EENT: normal  Lymph Nodes:  none Neck: Negative for carotid bruits. JVD not elevated. Back:without scoliosis kyphosis Lungs: Clear bilaterally to auscultation without wheezes, rales, or rhonchi. Breathing is unlabored. Heart: RRR with S1 S2. 2/6 systolic murmur . No rubs, or gallops appreciated. Abdomen: Soft, non-tender, non-distended with normoactive bowel sounds. No hepatomegaly. No rebound/guarding. No obvious abdominal masses. Msk:  Strength and tone appear normal for age. Extremities: No clubbing or cyanosis. No  edema.  Distal pedal pulses are 2+ and equal bilaterally. Skin: Warm and Dry Neuro: Alert and oriented expressive aphasia with rare words to come out.  Tearful in his efforts to speak.. CN III-XII intact Grossly normal sensory and motor function . Psych:  Responds to questions appropriately with a normal affect.        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  He may well come to pacing but I do  not think is clearly indicated at this time.  The issue with posttermination pausing currently is moot as his atrial arrhythmia is persistent.   Has some complaints of shortness of breath.  Guideline directed therapy however is limited by blood pressure.    His blood pressure may be contributing to some of his weakness.  We will work on obtaining orthostatic vital signs at home both early in the day and later; it may well be that he could benefit from compression and/or ProAmatine.  I would not use fludrocortisone given his history of heart failure and depressed left ventricular function       Virl Axe

## 2023-03-29 ENCOUNTER — Encounter: Payer: Self-pay | Admitting: Internal Medicine

## 2023-04-07 NOTE — Telephone Encounter (Signed)
Please Inform Patient Terrific data collection Comment 1-- orthostatic HYPERtension is evident but that shouldn't cause dizziness Comment 2 -- variable HR is not unsuspected given what we know abu this atrial flutter and the rate being all over the place Question 1-- does he note any dizziness when he had done the BP measurements and the HR goes DOWN Thanks

## 2023-05-07 ENCOUNTER — Telehealth: Payer: Self-pay

## 2023-05-07 NOTE — Telephone Encounter (Signed)
   Pre-operative Risk Assessment    Patient Name: Dustin Blanchard  DOB: [birth year] MRN: [id number]     Request for Surgical Clearance    Procedure:  Scaling and root planning and possible surgical procedures to extract  multiple teeth which will require opening a gingival flap and bone grafting   Date of Surgery:  Clearance TBD                                 Surgeon:  Dr. Earle Gell Surgeon's Group or Practice Name:  [facility]  Phone number:  [phone] Fax number:  [phone]   Type of Clearance Requested:   Requesting office would like to know if we advise any adjustments in patients medications pre-operatively or post-operatively     Type of Anesthesia:  Not Indicated   Additional requests/questions:    Scarlette Shorts   05/07/2023, 1:42 PM

## 2023-05-10 NOTE — Telephone Encounter (Signed)
Dr. Maurene Capes called back to confirm that 4 teeth are being extracted.

## 2023-05-10 NOTE — Telephone Encounter (Signed)
   Name: Dustin Blanchard  DOB: 1951-09-10  MRN: [id number]  Primary Cardiologist: Little Ishikawa, MD  Chart reviewed as part of pre-operative protocol coverage. Because of [name]'s past medical history and time since last visit, he will require a follow-up in-office visit in order to better assess preoperative cardiovascular risk.  Pre-op covering staff: - Please schedule appointment and call patient to inform them. If patient already had an upcoming appointment within acceptable timeframe, please add "pre-op clearance" to the appointment notes so provider is aware. - Please contact requesting surgeon's office via preferred method (i.e, phone, fax) to inform them of need for appointment prior to surgery.  This message will also be routed to pharmacy pool for input on holding Eliquis as requested below so that this information is available to the clearing provider at time of patient's appointment.   Joylene Grapes, NP  05/10/2023, 4:47 PM

## 2023-05-10 NOTE — Telephone Encounter (Signed)
Spoke with patient's wife (DPR on file) who states that it is okay to keep the appointment on 06/18/23 with Dr. Bjorn Pippin to be cleared for his procedure. I will update the appointment notes to reflect pre-op clearance and fax to the requesting provider's office.

## 2023-05-10 NOTE — Telephone Encounter (Signed)
Called the requesting office and spoke with Ricki Rodriguez she informed me that per Dr. Maurene Capes office note the extraction number of teeth is  4 (#s 8,9,24 & 25). She will have the doctor give office a call back to make sure that it is 4 teeth being extracted

## 2023-05-13 NOTE — Telephone Encounter (Signed)
Patient with diagnosis of afib on Eliquis for anticoagulation.    Procedure: 4 dental extractions, scaling and root planning, bone grafting Date of procedure: TBD  CHA2DS2-VASc Score = 4  This indicates a 4.8% annual risk of stroke. 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  CrCl 57mL/min Platelet count 244K  Patient does not require pre-op antibiotics for dental procedure.  Per office protocol, patient can hold Eliquis for 1 day prior to procedure. He should resume as soon as safely possible after given elevated CV risk.   **This guidance is not considered finalized until pre-operative APP has relayed final recommendations.**

## 2023-05-18 ENCOUNTER — Encounter: Payer: Self-pay | Admitting: Cardiology

## 2023-05-19 NOTE — Telephone Encounter (Signed)
Is it general anesthesia or local?

## 2023-05-20 ENCOUNTER — Emergency Department (HOSPITAL_COMMUNITY): Payer: Medicare HMO

## 2023-05-20 ENCOUNTER — Other Ambulatory Visit: Payer: Self-pay

## 2023-05-20 ENCOUNTER — Inpatient Hospital Stay (HOSPITAL_COMMUNITY)
Admission: EM | Admit: 2023-05-20 | Discharge: 2023-05-24 | DRG: 292 | Disposition: A | Discharge status: Home Health | Payer: Medicare HMO | Attending: Internal Medicine | Admitting: Internal Medicine

## 2023-05-20 ENCOUNTER — Encounter (HOSPITAL_COMMUNITY): Payer: Self-pay

## 2023-05-20 ENCOUNTER — Telehealth: Payer: Self-pay | Admitting: Cardiology

## 2023-05-20 DIAGNOSIS — I63512 Cerebral infarction due to unspecified occlusion or stenosis of left middle cerebral artery: Secondary | ICD-10-CM | POA: Diagnosis present

## 2023-05-20 DIAGNOSIS — R0602 Shortness of breath: Secondary | ICD-10-CM | POA: Diagnosis not present

## 2023-05-20 DIAGNOSIS — I4821 Permanent atrial fibrillation: Secondary | ICD-10-CM | POA: Diagnosis present

## 2023-05-20 DIAGNOSIS — I5023 Acute on chronic systolic (congestive) heart failure: Secondary | ICD-10-CM | POA: Diagnosis present

## 2023-05-20 DIAGNOSIS — I69320 Aphasia following cerebral infarction: Secondary | ICD-10-CM

## 2023-05-20 DIAGNOSIS — Z7984 Long term (current) use of oral hypoglycemic drugs: Secondary | ICD-10-CM

## 2023-05-20 DIAGNOSIS — I5043 Acute on chronic combined systolic (congestive) and diastolic (congestive) heart failure: Secondary | ICD-10-CM | POA: Diagnosis not present

## 2023-05-20 DIAGNOSIS — Z87891 Personal history of nicotine dependence: Secondary | ICD-10-CM

## 2023-05-20 DIAGNOSIS — Z7901 Long term (current) use of anticoagulants: Secondary | ICD-10-CM

## 2023-05-20 DIAGNOSIS — E785 Hyperlipidemia, unspecified: Secondary | ICD-10-CM | POA: Diagnosis present

## 2023-05-20 DIAGNOSIS — E8770 Fluid overload, unspecified: Secondary | ICD-10-CM

## 2023-05-20 DIAGNOSIS — I428 Other cardiomyopathies: Secondary | ICD-10-CM | POA: Diagnosis present

## 2023-05-20 DIAGNOSIS — I509 Heart failure, unspecified: Secondary | ICD-10-CM

## 2023-05-20 DIAGNOSIS — Z79899 Other long term (current) drug therapy: Secondary | ICD-10-CM

## 2023-05-20 DIAGNOSIS — F0390 Unspecified dementia without behavioral disturbance: Secondary | ICD-10-CM | POA: Diagnosis present

## 2023-05-20 DIAGNOSIS — R0902 Hypoxemia: Secondary | ICD-10-CM | POA: Diagnosis present

## 2023-05-20 LAB — BASIC METABOLIC PANEL
Anion gap: 10 (ref 5–15)
BUN: 11 mg/dL (ref 8–23)
CO2: 25 mmol/L (ref 22–32)
Calcium: 9.1 mg/dL (ref 8.9–10.3)
Chloride: 107 mmol/L (ref 98–111)
Creatinine, Ser: 1.1 mg/dL (ref 0.61–1.24)
GFR, Estimated: >60 mL/min (ref >60)
Glucose, Bld: 93 mg/dL (ref 70–99)
Potassium: 4.4 mmol/L (ref 3.5–5.1)
Sodium: 142 mmol/L (ref 135–145)

## 2023-05-20 LAB — CBC
HCT: 44.4 % (ref 39.0–52.0)
Hemoglobin: 14.7 g/dL (ref 13.0–17.0)
MCH: 32.5 pg (ref 26.0–34.0)
MCHC: 33.1 g/dL (ref 30.0–36.0)
MCV: 98.2 fL (ref 80.0–100.0)
Platelets: 196 10*3/uL (ref 150–400)
RBC: 4.52 MIL/uL (ref 4.22–5.81)
RDW: 12.7 % (ref 11.5–15.5)
WBC: 5.2 10*3/uL (ref 4.0–10.5)
nRBC: 0 % (ref 0.0–0.2)

## 2023-05-20 LAB — TSH: TSH: 2.003 u[IU]/mL (ref 0.350–4.500)

## 2023-05-20 LAB — BRAIN NATRIURETIC PEPTIDE: B Natriuretic Peptide: 1282.2 pg/mL — ABNORMAL HIGH (ref 0.0–100.0)

## 2023-05-20 LAB — TROPONIN I (HIGH SENSITIVITY)
Troponin I (High Sensitivity): 43 ng/L — ABNORMAL HIGH (ref <18)
Troponin I (High Sensitivity): 42 ng/L — ABNORMAL HIGH (ref <18)

## 2023-05-20 MED ORDER — FUROSEMIDE 10 MG/ML IJ SOLN
40.0000 mg | Freq: Two times a day (BID) | INTRAMUSCULAR | Status: DC
  Administered 2023-05-21: 40 mg via INTRAVENOUS
  Filled 2023-05-20 (×2): qty 4

## 2023-05-20 MED ORDER — VALPROIC ACID 250 MG/5ML PO SOLN
250.0000 mg | Freq: Every day | ORAL | Status: DC
  Administered 2023-05-20 – 2023-05-23 (×4): 250 mg via ORAL
  Filled 2023-05-20 (×4): qty 5

## 2023-05-20 MED ORDER — ACETAMINOPHEN 325 MG PO TABS
650.0000 mg | ORAL_TABLET | ORAL | Status: DC | PRN
  Administered 2023-05-21: 650 mg via ORAL
  Filled 2023-05-20 (×2): qty 2

## 2023-05-20 MED ORDER — MIRTAZAPINE 15 MG PO TABS
7.5000 mg | ORAL_TABLET | Freq: Every day | ORAL | Status: DC
  Administered 2023-05-20 – 2023-05-23 (×4): 7.5 mg via ORAL
  Filled 2023-05-20 (×4): qty 1

## 2023-05-20 MED ORDER — APIXABAN 5 MG PO TABS
5.0000 mg | ORAL_TABLET | Freq: Two times a day (BID) | ORAL | Status: DC
  Administered 2023-05-20 – 2023-05-24 (×8): 5 mg via ORAL
  Filled 2023-05-20 (×8): qty 1

## 2023-05-20 MED ORDER — FUROSEMIDE 10 MG/ML IJ SOLN
20.0000 mg | Freq: Once | INTRAMUSCULAR | Status: AC
  Administered 2023-05-20: 20 mg via INTRAVENOUS
  Filled 2023-05-20: qty 2

## 2023-05-20 MED ORDER — MELATONIN 3 MG PO TABS
3.0000 mg | ORAL_TABLET | Freq: Every day | ORAL | Status: DC
  Administered 2023-05-20 – 2023-05-23 (×4): 3 mg via ORAL
  Filled 2023-05-20 (×4): qty 1

## 2023-05-20 MED ORDER — EMPAGLIFLOZIN 10 MG PO TABS
10.0000 mg | ORAL_TABLET | Freq: Every day | ORAL | Status: DC
  Administered 2023-05-21 – 2023-05-24 (×4): 10 mg via ORAL
  Filled 2023-05-20 (×4): qty 1

## 2023-05-20 MED ORDER — ATORVASTATIN CALCIUM 10 MG PO TABS
20.0000 mg | ORAL_TABLET | Freq: Every day | ORAL | Status: DC
  Administered 2023-05-21 – 2023-05-24 (×4): 20 mg via ORAL
  Filled 2023-05-20 (×4): qty 2

## 2023-05-20 MED ORDER — TAMSULOSIN HCL 0.4 MG PO CAPS
0.4000 mg | ORAL_CAPSULE | Freq: Every day | ORAL | Status: DC
  Administered 2023-05-21 – 2023-05-24 (×4): 0.4 mg via ORAL
  Filled 2023-05-20 (×4): qty 1

## 2023-05-20 MED ORDER — CHOLECALCIFEROL 10 MCG (400 UNIT) PO TABS
800.0000 [IU] | ORAL_TABLET | Freq: Every day | ORAL | Status: DC
  Filled 2023-05-20: qty 2

## 2023-05-20 NOTE — ED Notes (Signed)
Patient denies c/o. Family reports increased work of breathing when laying flat. Patient started on diuretic yesterday.

## 2023-05-20 NOTE — ED Provider Notes (Signed)
[place] EMERGENCY DEPARTMENT AT [facility] Provider Note   CSN: [id number] Arrival date & time: 05/20/23  1549     History  Chief Complaint  Patient presents with   Shortness of Breath    Dustin Blanchard is a 72 y.o. male.  The history is provided by the patient and medical records. No language interpreter was used.  Shortness of Breath Severity:  Severe Onset quality:  Gradual Duration:  3 days Timing:  Intermittent Progression:  Waxing and waning Chronicity:  New Context: not URI   Relieved by:  Sitting up Worsened by:  Exertion (laying down) Ineffective treatments:  None tried Associated symptoms: no abdominal pain, no chest pain, no cough, no fever, no headaches, no neck pain, no rash, no sputum production, no vomiting and no wheezing   Risk factors: no hx of PE/DVT        Home Medications Prior to Admission medications   Medication Sig Start Date End Date Taking? Authorizing Provider  acetaminophen (TYLENOL) 325 MG tablet Take 2 tablets (650 mg total) by mouth every 4 (four) hours as needed for mild pain (or temp > 37.5 C (99.5 F)). 08/25/22   Angiulli, Mcarthur Rossetti, PA-C  apixaban (ELIQUIS) 5 MG TABS tablet TAKE 1 TABLET BY MOUTH TWICE A DAY 02/18/23   Little Ishikawa, MD  atorvastatin (LIPITOR) 20 MG tablet Take 20 mg by mouth daily. 03/01/23   [provider]  B Complex Vitamins (VITAMIN B COMPLEX PO)  09/10/22   [provider]  cholecalciferol (VITAMIN D3) 10 MCG (400 UNIT) TABS tablet Take 2 tablets (800 Units total) by mouth daily. 09/21/22   Raulkar, Drema Pry, MD  empagliflozin (JARDIANCE) 10 MG TABS tablet Take 1 tablet (10 mg total) by mouth daily before breakfast. 02/01/23   Little Ishikawa, MD  loperamide (IMODIUM A-D) 2 MG tablet Take 1 tablet (2 mg total) by mouth 4 (four) times daily as needed for diarrhea or loose stools. 12/29/22   Raulkar, Drema Pry, MD  melatonin 3 MG TABS tablet Take 1 tablet (3 mg total) by  mouth at bedtime. 08/25/22   Angiulli, Mcarthur Rossetti, PA-C  mirtazapine (REMERON) 7.5 MG tablet Take 1 tablet (7.5 mg total) by mouth at bedtime. 09/21/22   Raulkar, Drema Pry, MD  polyethylene glycol (MIRALAX / GLYCOLAX) 17 g packet Take 17 g by mouth daily. 08/25/22   Angiulli, Mcarthur Rossetti, PA-C  QUEtiapine (SEROQUEL) 25 MG tablet TAKE 1 TABLET (25 MG TOTAL) BY MOUTH DAILY WITH SUPPER. Patient taking differently: Take 25 mg by mouth as needed. 10/15/22   Ihor Austin, NP  RA ACETAMINOPHEN EX ST 500 MG tablet Take 1 tablet by mouth every six hours as needed 09/10/22   [provider]  tamsulosin (FLOMAX) 0.4 MG CAPS capsule Take 1 capsule (0.4 mg total) by mouth daily. 08/25/22   Angiulli, Mcarthur Rossetti, PA-C  valproic acid (DEPAKENE) 250 MG/5ML solution Take 5 mLs (250 mg total) by mouth at bedtime. 09/17/22   Ihor Austin, NP      Allergies    Patient has no known allergies.    Review of Systems   Review of Systems  Constitutional:  Positive for fatigue. Negative for chills and fever.  HENT:  Negative for congestion.   Respiratory:  Positive for shortness of breath. Negative for cough, sputum production, chest tightness and wheezing.   Cardiovascular:  Positive for leg swelling. Negative for chest pain and palpitations.  Gastrointestinal:  Negative for abdominal pain, constipation,  diarrhea, nausea and vomiting.  Genitourinary:  Negative for flank pain.  Musculoskeletal:  Negative for back pain, neck pain and neck stiffness.  Skin:  Negative for rash and wound.  Neurological:  Negative for headaches.  Psychiatric/Behavioral:  Negative for agitation.   All other systems reviewed and are negative.   Physical Exam Updated Vital Signs BP (!) 119/98   Pulse 94   Temp 98.1 F (36.7 C) (Oral)   Resp (!) 29   Ht 6' (1.829 m)   Wt 65 kg   SpO2 100%   BMI 19.43 kg/m  Physical Exam Vitals and nursing note reviewed.  Constitutional:      General: He is not in acute distress.    Appearance:  He is well-developed. He is not ill-appearing, toxic-appearing or diaphoretic.  HENT:     Head: Normocephalic and atraumatic.     Mouth/Throat:     Mouth: Mucous membranes are moist.  Eyes:     Conjunctiva/sclera: Conjunctivae normal.     Pupils: Pupils are equal, round, and reactive to light.  Cardiovascular:     Rate and Rhythm: Normal rate and regular rhythm.     Heart sounds: No murmur heard. Pulmonary:     Effort: Pulmonary effort is normal. Tachypnea present. No respiratory distress.     Breath sounds: Rales present. No wheezing.  Chest:     Chest wall: No tenderness.  Abdominal:     Palpations: Abdomen is soft.     Tenderness: There is no abdominal tenderness.  Musculoskeletal:        General: No swelling.     Cervical back: Neck supple.     Right lower leg: Edema present.     Left lower leg: Edema present.  Skin:    General: Skin is warm and dry.     Capillary Refill: Capillary refill takes less than 2 seconds.     Findings: No erythema.     Nails: There is no clubbing.  Neurological:     Mental Status: He is alert.  Psychiatric:        Mood and Affect: Mood normal.     ED Results / Procedures / Treatments   Labs (all labs ordered are listed, but only abnormal results are displayed) Labs Reviewed  BRAIN NATRIURETIC PEPTIDE - Abnormal; Notable for the following components:      Result Value   B Natriuretic Peptide 1,282.2 (*)    All other components within normal limits  TROPONIN I (HIGH SENSITIVITY) - Abnormal; Notable for the following components:   Troponin I (High Sensitivity) 43 (*)    All other components within normal limits  TROPONIN I (HIGH SENSITIVITY) - Abnormal; Notable for the following components:   Troponin I (High Sensitivity) 42 (*)    All other components within normal limits  BASIC METABOLIC PANEL  CBC  TSH  BASIC METABOLIC PANEL    EKG EKG Interpretation  Date/Time:  Thursday May 20 2023 15:57:39 EDT Ventricular Rate:  98 PR  Interval:    QRS Duration: 104 QT Interval:  377 QTC Calculation: 482 R Axis:   109 Text Interpretation: Atrial fibrillation Right axis deviation Probable anteroseptal infarct, old when compared to prior, new Aflutter. No sTEMI Confirmed by Theda Belfast ([id number]) on 05/20/2023 4:02:22 PM  Radiology DG Chest Portable 1 View  Result Date: 05/20/2023 CLINICAL DATA:  Shortness of breath. EXAM: PORTABLE CHEST 1 VIEW COMPARISON:  August 13, 2022. FINDINGS: Stable cardiomegaly. Mild bibasilar atelectasis or infiltrates are noted. Left lung  base is not entirely included in field-of-view. Elevated right hemidiaphragm is unremarkable. Bony thorax is unremarkable. IMPRESSION: Bibasilar opacities are noted concerning for atelectasis or possibly pneumonia. Elevated right hemidiaphragm. Electronically Signed   By: Lupita Raider M.D.   On: 05/20/2023 16:48    Procedures Procedures    Medications Ordered in ED Medications  acetaminophen (TYLENOL) tablet 650 mg (has no administration in time range)  apixaban (ELIQUIS) tablet 5 mg (has no administration in time range)  valproic acid (DEPAKENE) 250 MG/5ML solution 250 mg (has no administration in time range)  mirtazapine (REMERON) tablet 7.5 mg (has no administration in time range)  empagliflozin (JARDIANCE) tablet 10 mg (has no administration in time range)  atorvastatin (LIPITOR) tablet 20 mg (has no administration in time range)  melatonin tablet 3 mg (has no administration in time range)  tamsulosin (FLOMAX) capsule 0.4 mg (has no administration in time range)  cholecalciferol (VITAMIN D3) 10 MCG (400 UNIT) tablet 800 Units (has no administration in time range)  furosemide (LASIX) injection 40 mg (has no administration in time range)  furosemide (LASIX) injection 20 mg (20 mg Intravenous Given 05/20/23 2054)    ED Course/ Medical Decision Making/ A&P                             Medical Decision Making Amount and/or Complexity of Data  Reviewed Labs: ordered. Radiology: ordered.  Risk Prescription drug management. Decision regarding hospitalization.    Jaye Fragoza is a 72 y.o. male with a past medical history significant for previous stroke with residual aphasia, CHF, atrial fibrillation on Eliquis therapy, and document dementia who presents for bilateral lower extremity edema and shortness of breath.  According to patient and family, patient has been more winded and fatigued when trying to get around and is having difficulty laying flat due to shortness of breath.  They have also noticed more edema in his legs for the last few days.  Family reports patient was started on a diuretic yesterday to help with this but his symptoms have continued today and they were concerned and brought him in.  Patient did agree that when he lays flat his breathing was much worse.  He is denying actual chest pain and they are not reporting any significant cough or productive cough.  No hemoptysis reported.  He is not having chest pain or abdominal pain.  No recent fevers chills or other symptoms.  Patient resting while sitting up.  On exam, patient does have some rales in the bases.  Chest and abdomen were nontender.  Legs have some edema bilaterally.  He is having some aphasia but this is at his baseline per family.  Otherwise patient well-appearing.  EKG did not show STEMI.  Clinically I am concerned about some fluid overload leading to his positional shortness of breath.  Will ambulate with pulse oximetry and try to get a saturation when he lays flat to provoke his symptoms.  Will get x-ray and labs.  Anticipate reassessment after workup to determine if he is safe for discharge home or not.  8:07 PM Workup began to return.  BNP elevated at 1282 and TSH normal.  Troponin slightly elevated in the 40s but CBC and BMP reassuring.  X-ray showed atelectasis versus pneumonia however given lack of new cough have less suspicion for infection.   Suspect degree of fluid.  Patient was ambulated and oxygen saturation dropped into the 80s.  He does not  take oxygen at home.  Will place on oxygen and admit for diuresis and further management of suspected new heart failure exacerbation.  Family reports that he was started on 10 of Lasix yesterday.  Will order 20 IV Lasix to get things started and call for admission for the new hypoxia in the setting of fluid overload.         Final Clinical Impression(s) / ED Diagnoses Final diagnoses:  Hypoxia  SOB (shortness of breath)  Hypervolemia, unspecified hypervolemia type     Clinical Impression: 1. Hypoxia   2. SOB (shortness of breath)   3. Hypervolemia, unspecified hypervolemia type     Disposition: Admit  This note was prepared with assistance of Dragon voice recognition software. Occasional wrong-word or sound-a-like substitutions may have occurred due to the inherent limitations of voice recognition software.      Dustin Blanchard, Canary Brim, MD 05/20/23 [phone]

## 2023-05-20 NOTE — ED Triage Notes (Signed)
Pt arrived via [facility] from home for c/o SOBx1wk worse with laying. Per EMS, pt has deficits from previous stroke for right sided weakness and aphasia. Per EMS, pt's lower lobes rhonchi. Pt has 2+ edema of right ankle and 1+ edema of left ankle.

## 2023-05-20 NOTE — Telephone Encounter (Signed)
Pt c/o Shortness Of Breath: STAT if SOB developed within the last 24 hours or pt is noticeably SOB on the phone  1. Are you currently SOB (can you hear that pt is SOB on the phone)?   No  2. How long have you been experiencing SOB?  Wife stated a couple of days ago but is getting worse.  3. Are you SOB when sitting or when up moving around?   Both sitting and when up and moving around  4. Are you currently experiencing any other symptoms?  No  Wife stated patient gets SOB, starts gasping after he moves around a little.

## 2023-05-20 NOTE — ED Notes (Signed)
This tech ambulated with pt on O2 monitor and pt sat dropped from 100 down to 86. RN made aware.

## 2023-05-20 NOTE — Telephone Encounter (Signed)
Spoke to [employer] at dental office.  Procedure on 6/5 is only for scaling, root planning, and deep cleaning.  No surgery this day.   May use local anesthesia (if any is used).     Unsure if Eliquis needs to be held. Dr. Maurene Capes is at lunch but will call when she returns.

## 2023-05-20 NOTE — ED Notes (Signed)
ED TO INPATIENT HANDOFF REPORT  ED Nurse Name and Phone #: Amil Amen [phone]  S Name/Age/Gender Dustin Blanchard 72 y.o. male Room/Bed: 030C/030C  Code Status   Code Status: Prior  Home/SNF/Other Home Patient oriented to: self, place, time, and situation Is this baseline? Yes   Triage Complete: Triage complete  Chief Complaint CHF exacerbation (HCC) [I50.9]  Triage Note Pt arrived via [facility] from home for c/o SOBx1wk worse with laying. Per EMS, pt has deficits from previous stroke for right sided weakness and aphasia. Per EMS, pt's lower lobes rhonchi. Pt has 2+ edema of right ankle and 1+ edema of left ankle.   Allergies No Known Allergies  Level of Care/Admitting Diagnosis ED Disposition     ED Disposition  Admit   Condition  --   Comment  Hospital [facility]: [facility] [[id number]]  Level of Care: Telemetry Cardiac [103]  May place patient in observation at [facility] if equivalent level of care is available:: Yes  Covid Evaluation: Asymptomatic - no recent exposure (last 10 days) testing not required  Diagnosis: CHF exacerbation [facility]) [[id number]]  Admitting Physician: Dorcas Carrow [[id number]]  Attending Physician: Dorcas Carrow [[id number]]          B Medical/Surgery History Past Medical History:  Diagnosis Date   Aphasia as late effect of stroke    Atrial flutter (HCC)    Cardiomyopathy, nonischemic (HCC)    Dementia (HCC)    Stroke [facility])    Past Surgical History:  Procedure Laterality Date   HERNIA REPAIR     HIP SURGERY     IR CT HEAD LTD  08/06/2022   IR CT HEAD LTD  08/06/2022   IR PERCUTANEOUS ART THROMBECTOMY/INFUSION INTRACRANIAL INC DIAG ANGIO  08/06/2022   IR US GUIDE VASC ACCESS RIGHT  08/06/2022   RADIOLOGY WITH ANESTHESIA N/A 08/06/2022   Procedure: IR WITH ANESTHESIA;  Surgeon: Julieanne Cotton, MD;  Location: [facility] OR;  Service: Radiology;  Laterality: N/A;     A IV Location/Drains/Wounds Patient  Lines/Drains/Airways Status     Active Line/Drains/Airways     Name Placement date Placement time Site Days   Peripheral IV 05/20/23 20 G 1.16" Anterior;Left;Upper Arm 05/20/23  1635  Arm  less than 1            Intake/Output Last 24 hours No intake or output data in the 24 hours ending 05/20/23 2059  Labs/Imaging Results for orders placed or performed during the hospital encounter of 05/20/23 (from the past 48 hour(s))  Basic metabolic panel     Status: None   Collection Time: 05/20/23  4:35 PM  Result Value Ref Range   Sodium 142 135 - 145 mmol/L   Potassium 4.4 3.5 - 5.1 mmol/L   Chloride 107 98 - 111 mmol/L   CO2 25 22 - 32 mmol/L   Glucose, Bld 93 70 - 99 mg/dL    Comment: Glucose reference range applies only to samples taken after fasting for at least 8 hours.   BUN 11 8 - 23 mg/dL   Creatinine, Ser 0.86 0.61 - 1.24 mg/dL   Calcium 9.1 8.9 - 57.8 mg/dL   GFR, Estimated >46 >96 mL/min    Comment: (NOTE) Calculated using the CKD-EPI Creatinine Equation (2021)    Anion gap 10 5 - 15    Comment: Performed at [facility], [address], [place], [place] [address]  CBC     Status: None   Collection Time: 05/20/23  4:35 PM  Result Value Ref Range   WBC 5.2 4.0 - 10.5 K/uL   RBC 4.52 4.22 - 5.81 MIL/uL   Hemoglobin 14.7 13.0 - 17.0 g/dL   HCT 40.9 81.1 - 91.4 %   MCV 98.2 80.0 - 100.0 fL   MCH 32.5 26.0 - 34.0 pg   MCHC 33.1 30.0 - 36.0 g/dL   RDW 78.2 95.6 - 21.3 %   Platelets 196 150 - 400 K/uL   nRBC 0.0 0.0 - 0.2 %    Comment: Performed at [facility], [address] [place], [place] [address]  Troponin I (High Sensitivity)     Status: Abnormal   Collection Time: 05/20/23  4:35 PM  Result Value Ref Range   Troponin I (High Sensitivity) 43 (H) <18 ng/L    Comment: (NOTE) Elevated high sensitivity troponin I (hsTnI) values and significant  changes across serial measurements may suggest ACS but many other  chronic and acute conditions are known  to elevate hsTnI results.  Refer to the "Links" section for chest pain algorithms and additional  guidance. Performed at [facility], [address] [place], [place] [address]   Brain natriuretic peptide     Status: Abnormal   Collection Time: 05/20/23  4:35 PM  Result Value Ref Range   B Natriuretic Peptide 1,282.2 (H) 0.0 - 100.0 pg/mL    Comment: Performed at [facility], [address] [place], [place] [address]  TSH     Status: None   Collection Time: 05/20/23  4:35 PM  Result Value Ref Range   TSH 2.003 0.350 - 4.500 uIU/mL    Comment: Performed by a 3rd Generation assay with a functional sensitivity of <=0.01 uIU/mL. Performed at [facility], [address], [place], [place] [address]   Troponin I (High Sensitivity)     Status: Abnormal   Collection Time: 05/20/23  6:10 PM  Result Value Ref Range   Troponin I (High Sensitivity) 42 (H) <18 ng/L    Comment: (NOTE) Elevated high sensitivity troponin I (hsTnI) values and significant  changes across serial measurements may suggest ACS but many other  chronic and acute conditions are known to elevate hsTnI results.  Refer to the "Links" section for chest pain algorithms and additional  guidance. Performed at [facility], [address], [place], [place] [address]    DG Chest Portable 1 View  Result Date: 05/20/2023 CLINICAL DATA:  Shortness of breath. EXAM: PORTABLE CHEST 1 VIEW COMPARISON:  August 13, 2022. FINDINGS: Stable cardiomegaly. Mild bibasilar atelectasis or infiltrates are noted. Left lung base is not entirely included in field-of-view. Elevated right hemidiaphragm is unremarkable. Bony thorax is unremarkable. IMPRESSION: Bibasilar opacities are noted concerning for atelectasis or possibly pneumonia. Elevated right hemidiaphragm. Electronically Signed   By: Lupita Raider M.D.   On: 05/20/2023 16:48    Pending Labs Unresulted Labs (From admission, onward)    None        Vitals/Pain Today's Vitals   05/20/23 2015 05/20/23 2015 05/20/23 2030 05/20/23 2045  BP: (!) 136/104  (!) 151/102 112/83  Pulse: 92  85 (!) 129  Resp: (!) 21  20 (!) 26  Temp:      TempSrc:      SpO2: 97% 100% 100% 98%  Weight:      Height:      PainSc:        Isolation Precautions No active isolations  Medications Medications  furosemide (LASIX) injection 20 mg (20 mg Intravenous Given 05/20/23 2054)    Mobility walks     Focused Assessments Cardiac Assessment Handoff:  Cardiac Rhythm: Atrial fibrillation No results found for: "CKTOTAL", "CKMB", "CKMBINDEX", "TROPONINI" No results found for: "DDIMER" Does the Patient currently have chest pain? No   , Pulmonary Assessment Handoff:  Lung sounds: Bilateral Breath Sounds: Rhonchi (bases) O2 Device: Nasal Cannula O2 Flow Rate (L/min): 1 L/min    R Recommendations: See Admitting Provider Note  Report given to:   Additional Notes: BNP 12,000, Just given 20mg  IV Lasix. Pt was satting 100% RA in bed, on ambulation trial pt dropped to 86%. Placed back in bed on 1L [place] (Per MD) Family at bedside.

## 2023-05-20 NOTE — H&P (Signed)
History and Physical    Dustin Blanchard ZOX:[id number] DOB: [birth year] DOA: 05/20/2023  PCP: Diana Eves, MD  Patient coming from: Home  I have personally briefly reviewed patient's old medical records available.   Chief Complaint: Shortness of breath for 2 days  HPI: Dustin Blanchard is a 72 y.o. male with medical history significant of chronic combined systolic and diastolic heart failure, history of stroke, paroxysmal A-fib therapeutic on Eliquis, dementia presents to the emergency room with about 2 days of shortness of breath.  Patient has some aphasia after he suffered a stroke on 07/2022 related to A-fib however he is able to communicate.  According to the wife and son at the bedside he has been doing well since the stroke, he is not on any maintenance diuretic therapy.  His latest ejection fraction at 20 to 25% but not on any maintenance diuretic therapy and GDMT because of low blood pressures. He was also noted to be gaining about 13 pounds of weight since last checked at home. Denies any fever or chills.  Denies any cough cold.  Denies any headache nausea vomiting.  Appetite is fair.  Urine and bowel habits are normal. ED Course: On 2 L oxygen.  He was mobilized and he dropped to 86% on room air.  He does have ankle edema.  Chest x-ray noted bibasilar opacities.  COVID-19, influenza and RSV negative.  BNP is 1282.  Troponin 42-43.  With shortness of breath and weight gain, treatment initiated with IV Lasix and admission requested. Of note, visited primary care physician yesterday and is started on Lasix 10 mg.  Review of Systems: all systems are reviewed and pertinent positive as per HPI otherwise rest are negative.    Past Medical History:  Diagnosis Date   Aphasia as late effect of stroke    Atrial flutter (HCC)    Cardiomyopathy, nonischemic (HCC)    Dementia (HCC)    Stroke [facility])     Past Surgical History:  Procedure Laterality Date   HERNIA REPAIR     HIP SURGERY     IR  CT HEAD LTD  08/06/2022   IR CT HEAD LTD  08/06/2022   IR PERCUTANEOUS ART THROMBECTOMY/INFUSION INTRACRANIAL INC DIAG ANGIO  08/06/2022   IR US GUIDE VASC ACCESS RIGHT  08/06/2022   RADIOLOGY WITH ANESTHESIA N/A 08/06/2022   Procedure: IR WITH ANESTHESIA;  Surgeon: Julieanne Cotton, MD;  Location: [facility] OR;  Service: Radiology;  Laterality: N/A;    Social history   reports that he has quit smoking. He has never used smokeless tobacco. He reports that he does not currently use drugs. He reports that he does not drink alcohol.  No Known Allergies  History reviewed. No pertinent family history.   Prior to Admission medications   Medication Sig Start Date End Date Taking? Authorizing Provider  apixaban (ELIQUIS) 5 MG TABS tablet TAKE 1 TABLET BY MOUTH TWICE A DAY 02/18/23  Yes Little Ishikawa, MD  atorvastatin (LIPITOR) 20 MG tablet Take 20 mg by mouth daily. 03/01/23  Yes [provider]  B Complex Vitamins (VITAMIN B COMPLEX PO)  09/10/22  Yes [provider]  cholecalciferol (VITAMIN D3) 10 MCG (400 UNIT) TABS tablet Take 2 tablets (800 Units total) by mouth daily. 09/21/22  Yes Raulkar, Drema Pry, MD  empagliflozin (JARDIANCE) 10 MG TABS tablet Take 1 tablet (10 mg total) by mouth daily before breakfast. 02/01/23  Yes Little Ishikawa, MD  furosemide (LASIX) 20 MG tablet Take 10 mg  by mouth daily. 05/19/23  Yes [provider]  melatonin 3 MG TABS tablet Take 1 tablet (3 mg total) by mouth at bedtime. 08/25/22  Yes Angiulli, Mcarthur Rossetti, PA-C  mirtazapine (REMERON) 7.5 MG tablet Take 1 tablet (7.5 mg total) by mouth at bedtime. 09/21/22  Yes Raulkar, Drema Pry, MD  polyethylene glycol (MIRALAX / GLYCOLAX) 17 g packet Take 17 g by mouth daily. 08/25/22  Yes Angiulli, Mcarthur Rossetti, PA-C  tamsulosin (FLOMAX) 0.4 MG CAPS capsule Take 1 capsule (0.4 mg total) by mouth daily. 08/25/22  Yes Angiulli, Mcarthur Rossetti, PA-C  valproic acid (DEPAKENE) 250 MG/5ML solution Take 5 mLs (250 mg  total) by mouth at bedtime. 09/17/22  Yes McCue, Shanda Bumps, NP  acetaminophen (TYLENOL) 325 MG tablet Take 2 tablets (650 mg total) by mouth every 4 (four) hours as needed for mild pain (or temp > 37.5 C (99.5 F)). 08/25/22   Angiulli, Mcarthur Rossetti, PA-C  loperamide (IMODIUM A-D) 2 MG tablet Take 1 tablet (2 mg total) by mouth 4 (four) times daily as needed for diarrhea or loose stools. Patient not taking: Reported on 05/20/2023 12/29/22   Horton Chin, MD  QUEtiapine (SEROQUEL) 25 MG tablet TAKE 1 TABLET (25 MG TOTAL) BY MOUTH DAILY WITH SUPPER. Patient taking differently: Take 25 mg by mouth as needed. 10/15/22   Ihor Austin, NP  RA ACETAMINOPHEN EX ST 500 MG tablet Take 1 tablet by mouth every six hours as needed 09/10/22   [provider]    Physical Exam: Vitals:   05/20/23 2030 05/20/23 2045 05/20/23 2100 05/20/23 2211  BP: (!) 151/102 112/83 (!) 134/111 (!) 120/90  Pulse: 85 (!) 129 (!) 130 (!) 105  Resp: 20 (!) 26 (!) 26 18  Temp:      TempSrc:    Oral  SpO2: 100% 98% 100% 99%  Weight:    66 kg  Height:    6\' 1"  (1.854 m)    Constitutional: NAD, calm, comfortable.  He looks pretty comfortable on 2 L oxygen.  He has some aphasia but he is calm quiet and interactive. Vitals:   05/20/23 2030 05/20/23 2045 05/20/23 2100 05/20/23 2211  BP: (!) 151/102 112/83 (!) 134/111 (!) 120/90  Pulse: 85 (!) 129 (!) 130 (!) 105  Resp: 20 (!) 26 (!) 26 18  Temp:      TempSrc:    Oral  SpO2: 100% 98% 100% 99%  Weight:    66 kg  Height:    6\' 1"  (1.854 m)   Eyes: PERRL, lids and conjunctivae normal ENMT: Mucous membranes are moist. Posterior pharynx clear of any exudate or lesions.Normal dentition.  Neck: normal, supple, no masses, no thyromegaly Respiratory: No appreciable added sounds.   Cardiovascular: Irregularly irregular, no murmurs / rubs / gallops.  1+ bilateral ankle edema. 2+ pedal pulses. No carotid bruits.  Abdomen: no tenderness, no masses palpated. No hepatosplenomegaly.  Bowel sounds positive.  Musculoskeletal: no clubbing / cyanosis. No joint deformity upper and lower extremities. Good ROM, no contractures. Normal muscle tone.  Skin: no rashes, lesions, ulcers. No induration Neurologic: He has some expressive aphasia.  Otherwise grossly normal.  Sensation intact, DTR normal. Strength 5/5 in all 4.  Psychiatric: Normal judgment and insight. Alert and oriented x 2-3.  Flat affect.  Normal mood.     Labs on Admission: I have personally reviewed following labs and imaging studies  CBC: Recent Labs  Lab 05/20/23 1635  WBC 5.2  HGB 14.7  HCT 44.4  MCV 98.2  PLT 196   Basic Metabolic Panel: Recent Labs  Lab 05/20/23 1635  NA 142  K 4.4  CL 107  CO2 25  GLUCOSE 93  BUN 11  CREATININE 1.10  CALCIUM 9.1   GFR: Estimated Creatinine Clearance: 57.5 mL/min (by C-G formula based on SCr of 1.1 mg/dL). Liver Function Tests: No results for input(s): "AST", "ALT", "ALKPHOS", "BILITOT", "PROT", "ALBUMIN" in the last 168 hours. No results for input(s): "LIPASE", "AMYLASE" in the last 168 hours. No results for input(s): "AMMONIA" in the last 168 hours. Coagulation Profile: No results for input(s): "INR", "PROTIME" in the last 168 hours. Cardiac Enzymes: No results for input(s): "CKTOTAL", "CKMB", "CKMBINDEX", "TROPONINI" in the last 168 hours. BNP (last 3 results) No results for input(s): "PROBNP" in the last 8760 hours. HbA1C: No results for input(s): "HGBA1C" in the last 72 hours. CBG: No results for input(s): "GLUCAP" in the last 168 hours. Lipid Profile: No results for input(s): "CHOL", "HDL", "LDLCALC", "TRIG", "CHOLHDL", "LDLDIRECT" in the last 72 hours. Thyroid Function Tests: Recent Labs    05/20/23 1635  TSH 2.003   Anemia Panel: No results for input(s): "VITAMINB12", "FOLATE", "FERRITIN", "TIBC", "IRON", "RETICCTPCT" in the last 72 hours. Urine analysis:    Component Value Date/Time   COLORURINE AMBER (A) 01/06/2023 1216    APPEARANCEUR CLOUDY (A) 01/06/2023 1216   LABSPEC 1.020 01/06/2023 1216   PHURINE 5.0 01/06/2023 1216   GLUCOSEU NEGATIVE 01/06/2023 1216   HGBUR LARGE (A) 01/06/2023 1216   BILIRUBINUR NEGATIVE 01/06/2023 1216   KETONESUR NEGATIVE 01/06/2023 1216   PROTEINUR 100 (A) 01/06/2023 1216   NITRITE NEGATIVE 01/06/2023 1216   LEUKOCYTESUR SMALL (A) 01/06/2023 1216    Radiological Exams on Admission: DG Chest Portable 1 View  Result Date: 05/20/2023 CLINICAL DATA:  Shortness of breath. EXAM: PORTABLE CHEST 1 VIEW COMPARISON:  August 13, 2022. FINDINGS: Stable cardiomegaly. Mild bibasilar atelectasis or infiltrates are noted. Left lung base is not entirely included in field-of-view. Elevated right hemidiaphragm is unremarkable. Bony thorax is unremarkable. IMPRESSION: Bibasilar opacities are noted concerning for atelectasis or possibly pneumonia. Elevated right hemidiaphragm. Electronically Signed   By: Lupita Raider M.D.   On: 05/20/2023 16:48    EKG: Independently reviewed.  A-fib, rate controlled  Assessment/Plan Principal Problem:   CHF exacerbation (HCC)     1.  Acute on chronic combined heart failure: Presented with weight gain 13 pounds, ankle edema and shortness of breath.  New oxygen requirement. Admit.  IV Lasix 40 mg twice daily, intake and output monitoring.  Electrolyte monitoring and replacement.  Patient is unable to tolerate further GDMT therapies.  Depending upon the echocardiogram findings, need to involve heart failure team. Chest x-ray reported infiltrates, however patient does not have clinical evidence of bacterial infection.  Will monitor. Keep on oxygen to keep saturation more than 90%.  Evaluate for ambulatory oxygen need.  2.  Permanent A-fib: Rate controlled.  Currently not on any AV nodal blockers.  Therapeutic on Eliquis.  3.  History of stroke with aphasia: No new neurological deficits.  Therapeutic on Eliquis.  4.  Hyperlipidemia: On statin.   DVT  prophylaxis: Eliquis Code Status: Full code Family Communication: Wife and son at the bedside Disposition Plan: Home in a stable Consults called: None. Admission status: Observation, cardiac telemetry   Dorcas Carrow MD [facility] Pager [phone]

## 2023-05-20 NOTE — Telephone Encounter (Signed)
Patient wife states he has been experiencing SOB and it is more frequent and getting worse. At first it was when he was moving around but today even when he is just sitting there she can see him gasping for air and she can see how had he is working trying to catch his breath. Patient can't tell her if he is having any other symptoms. I did advise her to call 911 being that he is working that hard to breathe. We can not determine is other symptoms and unable to get a good set of vitals. She verbalized understanding.

## 2023-05-21 ENCOUNTER — Observation Stay (HOSPITAL_BASED_OUTPATIENT_CLINIC_OR_DEPARTMENT_OTHER): Payer: Medicare HMO

## 2023-05-21 ENCOUNTER — Encounter (HOSPITAL_COMMUNITY): Payer: Self-pay | Admitting: Internal Medicine

## 2023-05-21 DIAGNOSIS — I5023 Acute on chronic systolic (congestive) heart failure: Secondary | ICD-10-CM | POA: Diagnosis not present

## 2023-05-21 DIAGNOSIS — I5043 Acute on chronic combined systolic (congestive) and diastolic (congestive) heart failure: Secondary | ICD-10-CM | POA: Diagnosis not present

## 2023-05-21 DIAGNOSIS — E785 Hyperlipidemia, unspecified: Secondary | ICD-10-CM | POA: Diagnosis present

## 2023-05-21 DIAGNOSIS — Z7901 Long term (current) use of anticoagulants: Secondary | ICD-10-CM | POA: Diagnosis not present

## 2023-05-21 DIAGNOSIS — I69320 Aphasia following cerebral infarction: Secondary | ICD-10-CM | POA: Diagnosis not present

## 2023-05-21 DIAGNOSIS — Z87891 Personal history of nicotine dependence: Secondary | ICD-10-CM | POA: Diagnosis not present

## 2023-05-21 DIAGNOSIS — F0390 Unspecified dementia without behavioral disturbance: Secondary | ICD-10-CM | POA: Diagnosis present

## 2023-05-21 DIAGNOSIS — Z79899 Other long term (current) drug therapy: Secondary | ICD-10-CM | POA: Diagnosis not present

## 2023-05-21 DIAGNOSIS — R0902 Hypoxemia: Secondary | ICD-10-CM | POA: Diagnosis present

## 2023-05-21 DIAGNOSIS — I428 Other cardiomyopathies: Secondary | ICD-10-CM | POA: Diagnosis present

## 2023-05-21 DIAGNOSIS — I4821 Permanent atrial fibrillation: Secondary | ICD-10-CM | POA: Diagnosis present

## 2023-05-21 DIAGNOSIS — R0602 Shortness of breath: Secondary | ICD-10-CM | POA: Diagnosis present

## 2023-05-21 DIAGNOSIS — Z7984 Long term (current) use of oral hypoglycemic drugs: Secondary | ICD-10-CM | POA: Diagnosis not present

## 2023-05-21 LAB — ECHOCARDIOGRAM COMPLETE
AR max vel: 2.82 cm2
AV Area VTI: 2.96 cm2
AV Area mean vel: 2.82 cm2
AV Mean grad: 2 mmHg
AV Peak grad: 2.4 mmHg
Ao pk vel: 0.78 m/s
Area-P 1/2: 4.41 cm2
Height: 73 in
MV M vel: 4.47 m/s
MV Peak grad: 79.9 mmHg
S' Lateral: 4.6 cm
Weight: 2329.6 oz

## 2023-05-21 LAB — BASIC METABOLIC PANEL
Anion gap: 8 (ref 5–15)
BUN: 10 mg/dL (ref 8–23)
CO2: 24 mmol/L (ref 22–32)
Calcium: 9.2 mg/dL (ref 8.9–10.3)
Chloride: 109 mmol/L (ref 98–111)
Creatinine, Ser: 1.08 mg/dL (ref 0.61–1.24)
GFR, Estimated: >60 mL/min (ref >60)
Glucose, Bld: 136 mg/dL — ABNORMAL HIGH (ref 70–99)
Potassium: 4.1 mmol/L (ref 3.5–5.1)
Sodium: 141 mmol/L (ref 135–145)

## 2023-05-21 LAB — GLUCOSE, CAPILLARY: Glucose-Capillary: 96 mg/dL (ref 70–99)

## 2023-05-21 MED ORDER — VITAMIN D 25 MCG (1000 UNIT) PO TABS
1000.0000 [IU] | ORAL_TABLET | Freq: Every day | ORAL | Status: DC
  Administered 2023-05-21 – 2023-05-24 (×4): 1000 [IU] via ORAL
  Filled 2023-05-21 (×4): qty 1

## 2023-05-21 MED ORDER — FUROSEMIDE 10 MG/ML IJ SOLN
40.0000 mg | Freq: Every day | INTRAMUSCULAR | Status: DC
  Administered 2023-05-22: 40 mg via INTRAVENOUS
  Filled 2023-05-21: qty 4

## 2023-05-21 MED ORDER — PERFLUTREN LIPID MICROSPHERE
1.0000 mL | INTRAVENOUS | Status: AC | PRN
  Administered 2023-05-21: 2 mL via INTRAVENOUS

## 2023-05-21 NOTE — TOC Initial Note (Addendum)
Transition of Care [facility]) - Initial/Assessment Note    Patient Details  Name: Dustin Blanchard MRN: [id number] Date of Birth: January 30, 1951  Transition of Care [facility]) CM/SW Contact:    Leone Haven, RN Phone Number: 05/21/2023, 3:08 PM  Clinical Narrative:                 From home with wife, has PCP and insurance on file, has transport chair and shower chair at home.  Did not have any HH services pta.  He is ambulatory.  [facility] [place], wife will transport him home at dc.  Wife is support system. Await pt eval. Patient has a scale at home to weigh him self per wife he was weighing for the doctors but will try to weight daily now, and he has a bp cuff also she will check bp daily, he eats a low sodium diet ,will try to do better and use less salt.         Patient Goals and CMS Choice            Expected Discharge Plan and Services                                              Prior Living Arrangements/Services                       Activities of Daily Living   ADL Screening (condition at time of admission) Patient's cognitive ability adequate to safely complete daily activities?: Yes Is the patient deaf or have difficulty hearing?: No Does the patient have difficulty seeing, even when wearing glasses/contacts?: No Does the patient have difficulty concentrating, remembering, or making decisions?: Yes Patient able to express need for assistance with ADLs?: Yes Does the patient have difficulty dressing or bathing?: No Independently performs ADLs?: No Communication: Needs assistance Is this a change from baseline?: Pre-admission baseline Dressing (OT): Needs assistance Is this a change from baseline?: Pre-admission baseline Grooming: Needs assistance Is this a change from baseline?: Pre-admission baseline Feeding: Independent with device (comment) Bathing: Needs assistance Is this a change from baseline?: Pre-admission  baseline Toileting: Needs assistance Is this a change from baseline?: Pre-admission baseline In/Out Bed: Needs assistance Is this a change from baseline?: Pre-admission baseline Walks in Home: Needs assistance Is this a change from baseline?: Pre-admission baseline Does the patient have difficulty walking or climbing stairs?: Yes Weakness of Legs: Both Weakness of Arms/Hands: None  Permission Sought/Granted                  Emotional Assessment              Admission diagnosis:  SOB (shortness of breath) [R06.02] Hypoxia [R09.02] CHF exacerbation (HCC) [I50.9] Hypervolemia, unspecified hypervolemia type [E87.70] Acute on chronic systolic (congestive) heart failure (HCC) [I50.23] Patient Active Problem List   Diagnosis Date Noted   Acute on chronic systolic (congestive) heart failure (HCC) 05/21/2023   CHF exacerbation (HCC) 05/20/2023   Cardiomyopathy, nonischemic (HCC) 03/22/2023   Pneumonia of right lower lobe due to infectious organism    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 [place] [address] Phone: [phone] Fax: [phone]  [facility] [address] [place] [address] Phone: [phone] Fax: [phone]     Social Determinants of Health (SDOH) Social History: SDOH Screenings   Food Insecurity: Patient Unable To Answer (05/21/2023)  Housing: Patient Unable To Answer (05/21/2023)  Transportation Needs: Patient Unable To Answer (05/21/2023)  Utilities: Patient Unable To Answer (05/21/2023)  Alcohol Screen: Low Risk  (05/21/2023)   Depression (PHQ2-9): Low Risk  (12/29/2022)  Financial Resource Strain: Low Risk  (05/21/2023)  Tobacco Use: Medium Risk (05/21/2023)   SDOH Interventions: Alcohol Usage Interventions: Intervention Not Indicated (Score <7) Financial Strain Interventions: Intervention Not Indicated   Readmission Risk Interventions     No data to display

## 2023-05-21 NOTE — Plan of Care (Signed)
  Problem: Elimination: Goal: Will not experience complications related to urinary retention Outcome: Completed/Met   Problem: Pain Managment: Goal: General experience of comfort will improve Outcome: Completed/Met   Problem: Skin Integrity: Goal: Risk for impaired skin integrity will decrease Outcome: Completed/Met   

## 2023-05-21 NOTE — Plan of Care (Signed)
  Problem: Education: Goal: Ability to demonstrate management of disease process will improve Outcome: Progressing Goal: Ability to verbalize understanding of medication therapies will improve Outcome: Progressing   Problem: Activity: Goal: Capacity to carry out activities will improve Outcome: Progressing   Problem: Cardiac: Goal: Ability to achieve and maintain adequate cardiopulmonary perfusion will improve Outcome: Progressing   Problem: Clinical Measurements: Goal: Ability to maintain clinical measurements within normal limits will improve Outcome: Progressing Goal: Will remain free from infection Outcome: Progressing Goal: Diagnostic test results will improve Outcome: Progressing Goal: Respiratory complications will improve Outcome: Progressing Goal: Cardiovascular complication will be avoided Outcome: Progressing   Problem: Nutrition: Goal: Adequate nutrition will be maintained Outcome: Progressing   Problem: Coping: Goal: Level of anxiety will decrease Outcome: Progressing   Problem: Elimination: Goal: Will not experience complications related to bowel motility Outcome: Progressing   Problem: Safety: Goal: Ability to remain free from injury will improve Outcome: Progressing

## 2023-05-21 NOTE — Telephone Encounter (Signed)
Agree with plan 

## 2023-05-21 NOTE — Progress Notes (Addendum)
   Heart Failure Stewardship Pharmacist Progress Note   PCP: Diana Eves, MD PCP-Cardiologist: Little Ishikawa, MD    HPI:  72 yo M with PMH of CHF, CVA, afib, and dementia.  He was admitted 07/2022 with CVA. 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 Cardiology was consulted for new onset atrial fibrillation with RVR and heart failure. Echocardiogram showed EF 35-40%.   Echocardiogram 01/11/2023 showed EF 20 to 25%, moderate RV dysfunction. Referred to EP with sinus pauses. Not recommending PPM at this time.   Presented to the ED on 5/30 with shortness of breath, orthopnea, and LE edema. BNP elevated. CXR with bibasilar opacities for infection vs atelectasis. ECHO 5/31 showed LVEF 20-25%, global hypokinesis, mild LVH, RV normal, mild MR, mild AR, and a small pericardial effusion (no evidence of tamponade),    Current HF Medications: Diuretic: furosemide 40 mg IV BID SGLT2i: Jardiance 10 mg daily  Prior to admission HF Medications: Diuretic: furosemide 10 mg daily ACE/ARB/ARNI: losartan 12.5 mg daily SGLT2i: Jardiance 10 mg daily  Pertinent Lab Values: Serum creatinine 1.08, BUN 10, Potassium 4.1, Sodium 141, BNP 1282.2  Vital Signs: Weight: 145 lbs (admission weight: 145 lbs) Blood pressure: 100-110/80s  Heart rate: 70s  I/O: -1L yesterday; net -3.9L  Medication Assistance / Insurance Benefits Check: Does the patient have prescription insurance?  Yes Type of insurance plan: [employer] Medicare  Outpatient Pharmacy:  [facility] the [facility] willing [facility] TOC pharmacy at discharge? Yes Is the patient willing to transition their outpatient pharmacy to utilize a [facility] outpatient pharmacy?   Pending    Assessment: 1. Acute on chronic systolic CHF (LVEF 20-25%). Has not had ischemic evaluation done. NYHA class III symptoms. - Continue furosemide 40 mg IV BID. Strict I/Os and daily weights. Keep K>4 and Mg>2.  Will need to check magnesium in AM. - No BB with sinus pauses - GDMT has been challenging to optimize with hypotension  - May be able to start spironolactone pending BP - Continue Jardiance 10 mg daily   Plan: 1) Medication changes recommended at this time: - Check magnesium in AM - Start spironolactone tomorrow if BP ok  2) Patient assistance: - None pending  3)  Education  - To be completed prior to discharge  Sharen Hones, PharmD, BCPS Heart Failure Stewardship Pharmacist Phone [phone]

## 2023-05-21 NOTE — Progress Notes (Signed)
  Echocardiogram 2D Echocardiogram has been performed.  Vondell Sowell Wynn Banker 05/21/2023, 9:01 AM

## 2023-05-21 NOTE — Progress Notes (Signed)
Heart Failure Nurse Navigator Progress Note  PCP: Diana Eves, MD PCP-Cardiologist: Bjorn Pippin Admission Diagnosis: Hypoxia, shortness of breath, hypervolemia Admitted from: Home via [facility]  Presentation:   Dustin Blanchard presented with shortness of breath x 1 week, worse while laying flat, has right sided deficits from previous stroke , 2+ edema to ankles. BNP 1,282, Troponin 43, EKG with atrial fibrillation. CXR with bibasilar opacities for infection vs atelectasis. Per pharmacist note GDMT has been challenging due to hypotension.   Patient and family were educated on the sign and symptoms of heart failure, daily weights, when to call his doctor or go to the ED, Diet/ fluid restrictions, taking all medications as prescribed and attending all medical appointments. Patient/ family verbalized their understanding of education, a HF TOC appointment was scheduled for 06/09/2023 @ 12 noon.   ECHO/ LVEF: 20-25%  Clinical Course:  Past Medical History:  Diagnosis Date   Aphasia as late effect of stroke    Atrial flutter (HCC)    Cardiomyopathy, nonischemic (HCC)    Dementia (HCC)    Stroke (HCC)      Social History   Socioeconomic History   Marital status: Married    Spouse name: Rinaldo Cloud   Number of children: 3   Years of education: Not on file   Highest education level: Not on file  Occupational History   Occupation: Retired  Tobacco Use   Smoking status: Former   Smokeless tobacco: Never  Building services engineer Use: Never used  Substance and Sexual Activity   Alcohol use: No   Drug use: Not Currently   Sexual activity: Never  Other Topics Concern   Not on file  Social History Narrative   Not on file   Social Determinants of Health   Financial Resource Strain: Low Risk  (05/21/2023)   Overall Financial Resource Strain (CARDIA)    Difficulty of Paying Living Expenses: Not very hard  Food Insecurity: Patient Unable To Answer (05/21/2023)   Hunger Vital Sign    Worried  About Running Out of Food in the Last Year: Patient unable to answer    Ran Out of Food in the Last Year: Patient unable to answer  Transportation Needs: Patient Unable To Answer (05/21/2023)   PRAPARE - Administrator, Civil Service (Medical): Patient unable to answer    Lack of Transportation (Non-Medical): Patient unable to answer  Physical Activity: Not on file  Stress: Not on file  Social Connections: Not on file   Education Assessment and Provision:  Detailed education and instructions provided on heart failure disease management including the following:  Signs and symptoms of Heart Failure When to call the physician Importance of daily weights Low sodium diet Fluid restriction Medication management Anticipated future follow-up appointments  Patient education given on each of the above topics.  Patient acknowledges understanding via teach back method and acceptance of all instructions.  Education Materials:  "Living Better With Heart Failure" Booklet, HF zone tool, & Daily Weight Tracker Tool.  Patient has scale at home: yes Patient has pill box at home: NA    High Risk Criteria for Readmission and/or Poor Patient Outcomes: Heart failure hospital admissions (last 6 months): 0  No Show rate: 0 Difficult social situation: No Demonstrates medication adherence: yes Primary Language: English Literacy level: Reading, writing, and comprehension  Barriers of Care:   Medication management Diet/ fluids/ daily weights  Considerations/Referrals:   Referral made to Heart Failure Pharmacist Stewardship: Yes Referral made to Heart  Failure CSW/NCM TOC: No Referral made to Heart & Vascular TOC clinic: Yes, 06/09/2023 @ 12 noon  Items for Follow-up on DC/TOC: Medication management Diet/ fluid restrictions/ daily weights Continued HF education   Rhae Hammock, BSN, RN Heart Failure Print production planner Chat Only

## 2023-05-21 NOTE — Progress Notes (Signed)
[facility] Progress Note Patient: Dustin Blanchard ZOX:[id number] DOB: [birth year] DOA: 05/20/2023  DOS: the patient was seen and examined on 05/21/2023  Brief hospital course: PMH of chronic combined systolic and diastolic CHF, CVA, with residual deficit, dementia, paroxysmal A-fib on Eliquis presented to the hospital with complaints of shortness of breath.  Found to have acute on chronic combined CHF with weight gain.  Currently responding well to IV Lasix.  Now on room air. Assessment and Plan: Acute on chronic combined systolic and diastolic CHF. Echocardiogram 20 to 25%. No new wall motion abnormality. No new valvular abnormality. Treated with IV Lasix. Volume status improving. Weight improving but not back to baseline. Continue with IV Lasix for now. Was 86% on room air at the time of admission.  Currently improving to 100% on room air.  Permanent A-fib. Currently rate controlled. Has sinus pause therefore not on any AV nodal blockers. Continue Eliquis.  CVA with aphasia. No new deficit. Continue Eliquis.  HLD. Continue statin.   Subjective: Feeling better.  No nausea no vomiting no fever no chills.  Continues to have shortness of breath.  No chest pain.  Physical Exam: General: in Mild distress, No Rash Cardiovascular: S1 and S2 Present, No Murmur Respiratory: Good respiratory effort, Bilateral Air entry present. No Crackles, No wheezes Abdomen: Bowel Sound present, No tenderness Extremities: No edema Neuro: Alert and oriented x3, no new focal deficit  Data Reviewed: I have Reviewed nursing notes, Vitals, and Lab results. Since last encounter, pertinent lab results CBC and BMP   . I have ordered test including CBC and BMP  .   Disposition: Status is: Inpatient Remains inpatient appropriate because: Need for IV Lasix apixaban (ELIQUIS) tablet 5 mg   Family Communication: Family at bedside Level of care: Telemetry Cardiac   Vitals:   05/21/23 0332 05/21/23 0450  05/21/23 0845 05/21/23 1120  BP: (!) 118/104 116/84 (!) 109/94 108/71  Pulse: 79 76 75 78  Resp:  20 20 18   Temp: 98.3 F (36.8 C) 98 F (36.7 C)    TempSrc: Oral Oral    SpO2: 99% 99% 100% 100%  Weight:  66 kg    Height:         Author: Lynden Oxford, MD 05/21/2023 5:43 PM  Please look on [email or web address] to find out who is on call.

## 2023-05-21 NOTE — Hospital Course (Signed)
PMH of chronic combined systolic and diastolic CHF, CVA, with residual deficit, dementia, paroxysmal A-fib on Eliquis presented to the hospital with complaints of shortness of breath.  Found to have acute on chronic combined CHF with weight gain.  Currently responding well to IV Lasix.  Now on room air.

## 2023-05-22 DIAGNOSIS — I5043 Acute on chronic combined systolic (congestive) and diastolic (congestive) heart failure: Secondary | ICD-10-CM | POA: Diagnosis not present

## 2023-05-22 LAB — BASIC METABOLIC PANEL
Anion gap: 8 (ref 5–15)
BUN: 15 mg/dL (ref 8–23)
CO2: 25 mmol/L (ref 22–32)
Calcium: 9.3 mg/dL (ref 8.9–10.3)
Chloride: 108 mmol/L (ref 98–111)
Creatinine, Ser: 1.06 mg/dL (ref 0.61–1.24)
GFR, Estimated: >60 mL/min (ref >60)
Glucose, Bld: 104 mg/dL — ABNORMAL HIGH (ref 70–99)
Potassium: 4.8 mmol/L (ref 3.5–5.1)
Sodium: 141 mmol/L (ref 135–145)

## 2023-05-22 LAB — MAGNESIUM: Magnesium: 2.4 mg/dL (ref 1.7–2.4)

## 2023-05-22 MED ORDER — FUROSEMIDE 40 MG PO TABS
40.0000 mg | ORAL_TABLET | Freq: Every day | ORAL | Status: DC
  Administered 2023-05-23: 40 mg via ORAL
  Filled 2023-05-22: qty 1

## 2023-05-22 NOTE — Plan of Care (Signed)
  Problem: Education: Goal: Ability to demonstrate management of disease process will improve Outcome: Progressing Goal: Ability to verbalize understanding of medication therapies will improve Outcome: Progressing Goal: Individualized Educational Video(s) Outcome: Progressing   Problem: Activity: Goal: Capacity to carry out activities will improve Outcome: Progressing   Problem: Cardiac: Goal: Ability to achieve and maintain adequate cardiopulmonary perfusion will improve Outcome: Progressing   Problem: Clinical Measurements: Goal: Will remain free from infection Outcome: Progressing Goal: Diagnostic test results will improve Outcome: Progressing Goal: Respiratory complications will improve Outcome: Progressing Goal: Cardiovascular complication will be avoided Outcome: Progressing   Problem: Activity: Goal: Risk for activity intolerance will decrease Outcome: Progressing   Problem: Nutrition: Goal: Adequate nutrition will be maintained Outcome: Progressing   Problem: Coping: Goal: Level of anxiety will decrease Outcome: Progressing   Problem: Elimination: Goal: Will not experience complications related to bowel motility Outcome: Progressing   Problem: Safety: Goal: Ability to remain free from injury will improve Outcome: Progressing

## 2023-05-22 NOTE — Evaluation (Signed)
Occupational Therapy Evaluation Patient Details Name: Dustin Blanchard MRN: [id number] DOB: 1951/08/24 Today's Date: 05/22/2023   History of Present Illness Pt is a 72 y/o M presenting to ED On 5/30 with SOB, found to have acute on chronic combined CHF with weight gain. PMH includes combined systolic and diastolic CHF, CVA with residual deficits, dementia, paroxysmal A fib on Eliquis   Clinical Impression   Pt reports needing assist at baseline with ADLs and ambulates at baseline without AD. Pt's spouse provides 24/7 assist at home. Pt currently needing set up - min A for ADLs, min guard for bed mobility, and min guard for transfer with RW. Pt with aphasia at baseline, needs increased time, cues, and repetition for command follow. HR up to 130 bpm initially with sitting EOB, then remained 90-115 during session. Pt presenting with impairments listed below, will follow acutely. Recommend HHOT at d/c.     Recommendations for follow up therapy are one component of a multi-disciplinary discharge planning process, led by the attending physician.  Recommendations may be updated based on patient status, additional functional criteria and insurance authorization.   Assistance Recommended at Discharge Frequent or constant Supervision/Assistance  Patient can return home with the following A little help with walking and/or transfers;A little help with bathing/dressing/bathroom;Direct supervision/assist for medications management;Direct supervision/assist for financial management;Assist for transportation;Help with stairs or ramp for entrance;Assistance with cooking/housework    Functional Status Assessment  Patient has had a recent decline in their functional status and demonstrates the ability to make significant improvements in function in a reasonable and predictable amount of time.  Equipment Recommendations  None recommended by OT    Recommendations for Other Services PT consult     Precautions /  Restrictions Precautions Precautions: Fall Precaution Comments: aphasia from prior CVA Restrictions Weight Bearing Restrictions: No      Mobility Bed Mobility Overal bed mobility: Needs Assistance Bed Mobility: Sit to Supine, Supine to Sit     Supine to sit: Min guard Sit to supine: Min guard        Transfers Overall transfer level: Needs assistance Equipment used: 1 person hand held assist Transfers: Sit to/from Stand Sit to Stand: Min guard                  Balance Overall balance assessment: Needs assistance Sitting-balance support: Feet supported Sitting balance-Leahy Scale: Good     Standing balance support: During functional activity Standing balance-Leahy Scale: Fair Standing balance comment: minimally reaching for external support                           ADL either performed or assessed with clinical judgement   ADL Overall ADL's : Needs assistance/impaired Eating/Feeding: Set up;Bed level   Grooming: Minimal assistance;Standing   Upper Body Bathing: Standing;Min guard   Lower Body Bathing: Minimal assistance;Sitting/lateral leans   Upper Body Dressing : Min guard   Lower Body Dressing: Minimal assistance   Toilet Transfer: Min guard;Ambulation;Regular Social worker and Hygiene: Minimal assistance       Functional mobility during ADLs: Min guard       Vision   Vision Assessment?: No apparent visual deficits     Perception Perception Perception Tested?: No   Praxis Praxis Praxis tested?: Not tested    Pertinent Vitals/Pain Pain Assessment Pain Assessment: No/denies pain     Hand Dominance Right   Extremity/Trunk Assessment Upper Extremity Assessment Upper Extremity Assessment: Generalized weakness  Lower Extremity Assessment Lower Extremity Assessment: Defer to PT evaluation   Cervical / Trunk Assessment Cervical / Trunk Assessment: Normal   Communication  Communication Communication: Expressive difficulties (aphasia at baseline from prior CVA)   Cognition Arousal/Alertness: Awake/alert Behavior During Therapy: Flat affect Overall Cognitive Status: History of cognitive impairments - at baseline                                 General Comments: hx of dementia, diffiuclt to assess due to aphasia, needs increased time along with verbal/visual cues and repetition to follow commands     General Comments  HR up to 130 initially, then 90-115 during session, spouse present    Exercises     Shoulder Instructions      Home Living Family/patient expects to be discharged to:: Private residence Living Arrangements: Spouse/significant other Available Help at Discharge: Family;Available 24 hours/day Type of Home: House Home Access: Stairs to enter [employer] of Steps: 4 Entrance Stairs-Rails: Can reach both Home Layout: One level     Bathroom Shower/Tub: Chief Strategy Officer: Handicapped height Bathroom Accessibility: Yes   Home Equipment: BSC/3in1;Tub bench;Transport chair          Prior Functioning/Environment Prior Level of Function : Independent/Modified Independent;Driving             Mobility Comments: no AD use, transport chair for long distances ADLs Comments: spouse assists with shower, spouse assists with cooking/cleaning/med mgmt        OT Problem List: Decreased strength;Decreased range of motion;Decreased activity tolerance;Impaired balance (sitting and/or standing);Decreased cognition;Decreased safety awareness      OT Treatment/Interventions: Self-care/ADL training;Therapeutic exercise;Energy conservation;DME and/or AE instruction;Therapeutic activities;Balance training;Patient/family education;Cognitive remediation/compensation    OT Goals(Current goals can be found in the care plan section) Acute Rehab OT Goals Patient Stated Goal: none stated OT Goal Formulation: With  patient Time For Goal Achievement: 06/05/23 Potential to Achieve Goals: Good ADL Goals Pt Will Perform Upper Body Dressing: with supervision;sitting Pt Will Perform Lower Body Dressing: sitting/lateral leans;sit to/from stand;with min guard assist Pt Will Transfer to Toilet: ambulating;regular height toilet;with modified independence Pt Will Perform Tub/Shower Transfer: tub bench;Tub transfer;with supervision  OT Frequency: Min 1X/week    Co-evaluation              AM-PAC OT "6 Clicks" Daily Activity     Outcome Measure Help from another person eating meals?: None Help from another person taking care of personal grooming?: A Little Help from another person toileting, which includes using toliet, bedpan, or urinal?: A Little Help from another person bathing (including washing, rinsing, drying)?: A Little Help from another person to put on and taking off regular upper body clothing?: A Little Help from another person to put on and taking off regular lower body clothing?: A Little 6 Click Score: 19   End of Session Equipment Utilized During Treatment: Gait belt Nurse Communication: Mobility status  Activity Tolerance: Patient tolerated treatment well Patient left: in bed;with call bell/phone within reach;with bed alarm set;with family/visitor present  OT Visit Diagnosis: Unsteadiness on feet (R26.81);Other abnormalities of gait and mobility (R26.89);Muscle weakness (generalized) (M62.81)                Time: 1610-9604 OT Time Calculation (min): 21 min Charges:  OT General Charges $OT Visit: 1 Visit OT Evaluation $OT Eval Moderate Complexity: 1 Mod  Mckynna Vanloan K, OTD, OTR/L SecureChat Preferred Acute Rehab ([phone])  [phone]   Dalphine Handing 05/22/2023, 9:52 AM

## 2023-05-22 NOTE — Progress Notes (Signed)
[facility] Progress Note Patient: Dustin Blanchard WUJ:[id number] DOB: 02/05/1951 DOA: 05/20/2023  DOS: the patient was seen and examined on 05/22/2023  Brief hospital course: PMH of chronic combined systolic and diastolic CHF, CVA, with residual deficit, dementia, paroxysmal A-fib on Eliquis presented to the hospital with complaints of shortness of breath.  Found to have acute on chronic combined CHF with weight gain.  Currently responding well to IV Lasix.  Now on room air. Assessment and Plan: Acute on chronic combined systolic and diastolic CHF. Echocardiogram 20 to 25%. No new wall motion abnormality. No new valvular abnormality. Treated with IV Lasix. Volume status improving. Weight improving but not back to baseline. Received IV Lasix today.  Will transition to oral Lasix tomorrow.  Monitor. Was 86% on room air at the time of admission.  Currently improving to 100% on room air.  Permanent A-fib. Currently rate controlled. Has sinus pause therefore not on any AV nodal blockers. Continue Eliquis.  CVA with aphasia. No new deficit. Continue Eliquis.  HLD. Continue statin.   Subjective: Has shortness of breath.  No nausea or vomiting or fever no chills.  Physical Exam: In mild distress.  No rash. S1-S2 present Bilateral basal crackles improving. No edema  Chronic hemiparesis.  Chronic aphasia.  Data Reviewed: I have Reviewed nursing notes, Vitals, and Lab results. Reviewed CBC and BMP.  Reordered BMP.  Disposition: Status is: Inpatient Remains inpatient appropriate because: Monitor renal function. apixaban (ELIQUIS) tablet 5 mg   Family Communication: Family at bedside Level of care: Telemetry Cardiac   Vitals:   05/22/23 0522 05/22/23 0905 05/22/23 1149 05/22/23 1606  BP: 122/85 107/86 100/78 99/76  Pulse: 77 (!) 107 82 78  Resp: 20 20 20 20   Temp: (!) 97.5 F (36.4 C)  97.9 F (36.6 C) 98.3 F (36.8 C)  TempSrc: Oral  Oral Oral  SpO2: 92%  98% 95%   Weight: 63.1 kg     Height:         Author: Lynden Oxford, MD 05/22/2023 6:12 PM  Please look on [email or web address] to find out who is on call.

## 2023-05-22 NOTE — Evaluation (Signed)
Physical Therapy Evaluation Patient Details Name: Dustin Blanchard MRN: [id number] DOB: 12-05-1951 Today's Date: 05/22/2023  History of Present Illness  Pt is a 72 y/o M presenting to ED On 5/30 with SOB, found to have acute on chronic combined CHF with weight gain. PMH includes combined systolic and diastolic CHF, CVA with residual deficits, dementia, paroxysmal A fib on Eliquis  Clinical Impression  Pt was seen for gait and transfers with daughter in attendance and noted his control of HR is an issue with standing mobility.  He is able to decelerate with rest, but sat up in chair with alarm in place to work on endurance without HR being challenged.  Follow acutely for mobility with monitoring of cardiopulm challenge, not an issue with sats but will need to watch HR.  Follow for goals of acute PT outlined below.       Recommendations for follow up therapy are one component of a multi-disciplinary discharge planning process, led by the attending physician.  Recommendations may be updated based on patient status, additional functional criteria and insurance authorization.  Follow Up Recommendations       Assistance Recommended at Discharge Frequent or constant Supervision/Assistance  Patient can return home with the following  A little help with walking and/or transfers;A little help with bathing/dressing/bathroom;Assistance with cooking/housework;Assist for transportation;Help with stairs or ramp for entrance    Equipment Recommendations    Recommendations for Other Services       Functional Status Assessment Patient has had a recent decline in their functional status and demonstrates the ability to make significant improvements in function in a reasonable and predictable amount of time.     Precautions / Restrictions Precautions Precautions: Fall Precaution Comments: aphasia from prior CVA Restrictions Weight Bearing Restrictions: No Other Position/Activity Restrictions: has mild R side  neglect      Mobility  Bed Mobility Overal bed mobility: Needs Assistance Bed Mobility: Supine to Sit, Sit to Supine     Supine to sit: Min guard Sit to supine: Min guard   General bed mobility comments: minor help to get legs back to bed and cue body mechanics    Transfers Overall transfer level: Needs assistance Equipment used: 1 person hand held assist Transfers: Sit to/from Stand Sit to Stand: Min guard           General transfer comment: monitored sats with 100% reading    Ambulation/Gait Ambulation/Gait assistance: Min guard Gait Distance (Feet): 60 Feet (30 x 2) Assistive device: 1 person hand held assist Gait Pattern/deviations: Step-through pattern, Wide base of support Gait velocity: reduced Gait velocity interpretation: <1.31 ft/sec, indicative of household ambulator   General Gait Details: noted HR up to 144 initially and second walk up to 127 briefly  Stairs            Wheelchair Mobility    Modified Rankin (Stroke Patients Only)       Balance Overall balance assessment: Needs assistance Sitting-balance support: Feet supported Sitting balance-Leahy Scale: Good     Standing balance support: Single extremity supported Standing balance-Leahy Scale: Fair Standing balance comment: with HHA does not need support                             Pertinent Vitals/Pain Pain Assessment Pain Assessment: No/denies pain    Home Living Family/patient expects to be discharged to:: Private residence Living Arrangements: Spouse/significant other Available Help at Discharge: Family;Available 24 hours/day Type of Home: House Home  Access: Stairs to enter Entrance Stairs-Rails: Can reach both Entrance Stairs-Number of Steps: 4   Home Layout: One level Home Equipment: BSC/3in1;Tub bench;Transport chair Additional Comments: daughter has information on home for pt    Prior Function Prior Level of Function : Independent/Modified  Independent;Driving             Mobility Comments: no AD needed, wc for distances ADLs Comments: wife to help with care     Hand Dominance   Dominant Hand: Right    Extremity/Trunk Assessment   Upper Extremity Assessment Upper Extremity Assessment: Defer to OT evaluation    Lower Extremity Assessment Lower Extremity Assessment: Overall WFL for tasks assessed (R side mild neglect but not clear if vision affected)    Cervical / Trunk Assessment Cervical / Trunk Assessment: Normal  Communication   Communication: Expressive difficulties (aphasic)  Cognition Arousal/Alertness: Awake/alert Behavior During Therapy: Flat affect Overall Cognitive Status: History of cognitive impairments - at baseline                                          General Comments General comments (skin integrity, edema, etc.): Pt is up to walk twice with good sat control but HR is quite elevated from 100 baseline to 144 then 127 on the two walks    Exercises     Assessment/Plan    PT Assessment Patient needs continued PT services  PT Problem List Decreased activity tolerance;Decreased balance;Decreased coordination;Decreased knowledge of use of DME;Decreased safety awareness       PT Treatment Interventions DME instruction;Gait training;Functional mobility training;Therapeutic activities;Therapeutic exercise;Balance training;Neuromuscular re-education;Patient/family education;Stair training    PT Goals (Current goals can be found in the Care Plan section)  Acute Rehab PT Goals Patient Stated Goal: none stated PT Goal Formulation: With patient/family Time For Goal Achievement: 06/05/23 Potential to Achieve Goals: Good    Frequency Min 3X/week     Co-evaluation               AM-PAC PT "6 Clicks" Mobility  Outcome Measure Help needed turning from your back to your side while in a flat bed without using bedrails?: None Help needed moving from lying on your back to  sitting on the side of a flat bed without using bedrails?: A Little Help needed moving to and from a bed to a chair (including a wheelchair)?: A Little Help needed standing up from a chair using your arms (e.g., wheelchair or bedside chair)?: A Little Help needed to walk in hospital room?: A Little Help needed climbing 3-5 steps with a railing? : A Lot 6 Click Score: 18    End of Session Equipment Utilized During Treatment: Gait belt Activity Tolerance: Patient limited by fatigue;Treatment limited secondary to medical complications (Comment) Patient left: in chair;with call bell/phone within reach;with chair alarm set;with family/visitor present Nurse Communication: Mobility status PT Visit Diagnosis: Unsteadiness on feet (R26.81);Difficulty in walking, not elsewhere classified (R26.2);Hemiplegia and hemiparesis Hemiplegia - Right/Left: Right Hemiplegia - dominant/non-dominant: Dominant Hemiplegia - caused by: Unspecified    Time: 2355-7322 PT Time Calculation (min) (ACUTE ONLY): 23 min   Charges:   PT Evaluation $PT Eval Moderate Complexity: 1 Mod PT Treatments $Gait Training: 8-22 mins       Ivar Drape 05/22/2023, 4:21 PM  Samul Dada, PT PhD Acute Rehab Dept. Number: [facility] [id number] and [facility] [phone]

## 2023-05-23 DIAGNOSIS — I5043 Acute on chronic combined systolic (congestive) and diastolic (congestive) heart failure: Secondary | ICD-10-CM | POA: Diagnosis not present

## 2023-05-23 LAB — BASIC METABOLIC PANEL
Anion gap: 8 (ref 5–15)
BUN: 19 mg/dL (ref 8–23)
CO2: 26 mmol/L (ref 22–32)
Calcium: 9.8 mg/dL (ref 8.9–10.3)
Chloride: 106 mmol/L (ref 98–111)
Creatinine, Ser: 1.14 mg/dL (ref 0.61–1.24)
GFR, Estimated: >60 mL/min (ref >60)
Glucose, Bld: 116 mg/dL — ABNORMAL HIGH (ref 70–99)
Potassium: 3.9 mmol/L (ref 3.5–5.1)
Sodium: 140 mmol/L (ref 135–145)

## 2023-05-23 LAB — MAGNESIUM: Magnesium: 2.4 mg/dL (ref 1.7–2.4)

## 2023-05-23 MED ORDER — ORAL CARE MOUTH RINSE: 15.0000 mL | OROMUCOSAL | Status: DC | PRN

## 2023-05-23 MED ORDER — FUROSEMIDE 20 MG PO TABS
20.0000 mg | ORAL_TABLET | Freq: Every day | ORAL | Status: DC
  Administered 2023-05-24: 20 mg via ORAL
  Filled 2023-05-23: qty 1

## 2023-05-23 NOTE — Plan of Care (Signed)
  Problem: Education: Goal: Ability to demonstrate management of disease process will improve Outcome: Progressing Goal: Ability to verbalize understanding of medication therapies will improve Outcome: Progressing Goal: Individualized Educational Video(s) Outcome: Progressing   Problem: Activity: Goal: Capacity to carry out activities will improve Outcome: Progressing   Problem: Cardiac: Goal: Ability to achieve and maintain adequate cardiopulmonary perfusion will improve Outcome: Progressing   Problem: Education: Goal: Knowledge of General Education information will improve Description: Including pain rating scale, medication(s)/side effects and non-pharmacologic comfort measures Outcome: Progressing   Problem: Health Behavior/Discharge Planning: Goal: Ability to manage health-related needs will improve Outcome: Progressing   Problem: Clinical Measurements: Goal: Ability to maintain clinical measurements within normal limits will improve Outcome: Progressing Goal: Will remain free from infection Outcome: Progressing Goal: Diagnostic test results will improve Outcome: Progressing Goal: Respiratory complications will improve Outcome: Progressing   Problem: Nutrition: Goal: Adequate nutrition will be maintained Outcome: Progressing   Problem: Coping: Goal: Level of anxiety will decrease Outcome: Progressing   Problem: Elimination: Goal: Will not experience complications related to bowel motility Outcome: Progressing   Problem: Safety: Goal: Ability to remain free from injury will improve Outcome: Progressing

## 2023-05-23 NOTE — TOC Progression Note (Signed)
Transition of Care [facility]) - Progression Note    Patient Details  Name: Dustin Blanchard MRN: [id number] Date of Birth: Feb 15, 1951  Transition [facility]) CM/SW Contact  Tom-Johnson, Hershal Coria, RN Phone Number: 05/23/2023, 3:42 PM  Clinical Narrative:     CM spoke with patient, daughter and spouse at bedside about home health at discharge. Patient states he has no preference. Referral called in to [facility] and [facility] voiced acceptance, info on AVS. CM will continue to follow.        Expected Discharge Plan and Services                                               Social Determinants of Health (SDOH) Interventions SDOH Screenings   Food Insecurity: Patient Unable To Answer (05/21/2023)  Housing: Patient Unable To Answer (05/21/2023)  Transportation Needs: Patient Unable To Answer (05/21/2023)  Utilities: Patient Unable To Answer (05/21/2023)  Alcohol Screen: Low Risk  (05/21/2023)  Depression (PHQ2-9): Low Risk  (12/29/2022)  Financial Resource Strain: Low Risk  (05/21/2023)  Tobacco Use: Medium Risk (05/21/2023)    Readmission Risk Interventions     No data to display

## 2023-05-23 NOTE — Progress Notes (Signed)
[facility] Progress Note Patient: Dustin Blanchard RUE:[id number] DOB: 03-02-1951 DOA: 05/20/2023  DOS: the patient was seen and examined on 05/23/2023  Brief hospital course: PMH of chronic combined systolic and diastolic CHF, CVA, with residual deficit, dementia, paroxysmal A-fib on Eliquis presented to the hospital with complaints of shortness of breath.  Found to have acute on chronic combined CHF with weight gain.  Currently responding well to IV Lasix.  Now on room air. Assessment and Plan: Acute on chronic combined systolic and diastolic CHF. Echocardiogram 20 to 25%. No new wall motion abnormality. No new valvular abnormality. Treated with IV Lasix. Volume status improving. Weight improving and now close to baseline. Blood pressure soft.  Orthostatic positive. Monitor overnight.  Reduce Lasix dose. Home O2 evaluation tomorrow.  Permanent A-fib. Currently rate controlled. Has sinus pause therefore not on any AV nodal blockers. Continue Eliquis.  CVA with aphasia. No new deficit. Continue Eliquis.  HLD. Continue statin.   Subjective: Hemodynamic data in the hallway.  No nausea no vomiting.  Had some shortness of breath on exertion.  Physical Exam: In mild distress. S1-S2 present. Clear to auscultation. Improving edema. Chronic aphasia.  Data Reviewed: I have Reviewed nursing notes, Vitals, and Lab results. Reviewed CBC and BMP.  Reordered CBC and BMP.  Disposition: Status is: Inpatient Remains inpatient appropriate because: Monitor r for improvement in blood pressure apixaban (ELIQUIS) tablet 5 mg   Family Communication: Family at bedside Level of care: Telemetry Cardiac   Vitals:   05/23/23 0525 05/23/23 0528 05/23/23 0852 05/23/23 1301  BP: (!) 111/92 114/88 97/64 95/67   Pulse: 90 69 84 86  Resp:  18 17 17   Temp:  (!) 97.4 F (36.3 C) 98.1 F (36.7 C) 98.2 F (36.8 C)  TempSrc:  Oral Oral Oral  SpO2:  93% 99% 96%  Weight:  62.2 kg    Height:          Author: Lynden Oxford, MD 05/23/2023 4:09 PM  Please look on [email or web address] to find out who is on call.

## 2023-05-23 NOTE — Progress Notes (Signed)
Mobility Specialist Progress Note    05/23/23 1200  Mobility  Activity Ambulated with assistance in hallway  Level of Assistance Contact guard assist, steadying assist  Assistive Device Other (Comment) (HHA)  Distance Ambulated (ft) 250 ft  Activity Response Tolerated well  Mobility Referral Yes  $Mobility charge 1 Mobility  Mobility Specialist Start Time (ACUTE ONLY) 1146  Mobility Specialist Stop Time (ACUTE ONLY) 1159  Mobility Specialist Time Calculation (min) (ACUTE ONLY) 13 min   Pre-Mobility: 96 HR During Mobility: 122 HR Post-Mobility: 83 HR  Pt received sitting EOB and agreeable. No complaints on walk. Returned to chair with call bell in reach and family present.   [place] Mobility Specialist  Please Neurosurgeon or Rehab Office at [phone]

## 2023-05-24 ENCOUNTER — Other Ambulatory Visit (HOSPITAL_COMMUNITY): Payer: Self-pay

## 2023-05-24 DIAGNOSIS — I5023 Acute on chronic systolic (congestive) heart failure: Secondary | ICD-10-CM

## 2023-05-24 LAB — CBC
HCT: 44.7 % (ref 39.0–52.0)
Hemoglobin: 14.6 g/dL (ref 13.0–17.0)
MCH: 31.7 pg (ref 26.0–34.0)
MCHC: 32.7 g/dL (ref 30.0–36.0)
MCV: 97.2 fL (ref 80.0–100.0)
Platelets: 202 10*3/uL (ref 150–400)
RBC: 4.6 MIL/uL (ref 4.22–5.81)
RDW: 12.4 % (ref 11.5–15.5)
WBC: 5.8 10*3/uL (ref 4.0–10.5)
nRBC: 0 % (ref 0.0–0.2)

## 2023-05-24 LAB — COMPREHENSIVE METABOLIC PANEL
ALT: 22 U/L (ref 0–44)
AST: 23 U/L (ref 15–41)
Albumin: 3.2 g/dL — ABNORMAL LOW (ref 3.5–5.0)
Alkaline Phosphatase: 54 U/L (ref 38–126)
Anion gap: 8 (ref 5–15)
BUN: 20 mg/dL (ref 8–23)
CO2: 26 mmol/L (ref 22–32)
Calcium: 8.9 mg/dL (ref 8.9–10.3)
Chloride: 104 mmol/L (ref 98–111)
Creatinine, Ser: 1.08 mg/dL (ref 0.61–1.24)
GFR, Estimated: >60 mL/min (ref >60)
Glucose, Bld: 91 mg/dL (ref 70–99)
Potassium: 4.3 mmol/L (ref 3.5–5.1)
Sodium: 138 mmol/L (ref 135–145)
Total Bilirubin: 1 mg/dL (ref 0.3–1.2)
Total Protein: 5.6 g/dL — ABNORMAL LOW (ref 6.5–8.1)

## 2023-05-24 LAB — MAGNESIUM: Magnesium: 2.4 mg/dL (ref 1.7–2.4)

## 2023-05-24 MED ORDER — FUROSEMIDE 20 MG PO TABS
20.0000 mg | ORAL_TABLET | Freq: Every day | ORAL | 0 refills | Status: AC
  Filled 2023-05-24: qty 30, 30d supply, fill #0
  Filled 2023-05-24: qty 30, 24d supply, fill #0

## 2023-05-24 NOTE — TOC Transition Note (Addendum)
Transition of Care [facility]) - CM/SW Discharge Note   Patient Details  Name: Dustin Blanchard MRN: [id number] Date of Birth: 11/17/1951  Transition of Care [facility]) CM/SW Contact:  Leone Haven, RN Phone Number: 05/24/2023, 11:01 AM   Clinical Narrative:    Patient is for dc today, he is set up with [facility] for HHPT, HHOT.  Aslo wife is interested in cardiac rehab for him.  Wife states doctor was just there and will make referral for Cardiac rehab for him but would like for him to keep the [facility] with [facility] for now until referral is picked up.  Wife will transport him home today.   Final next level of care: Home w Home Health Services Barriers to Discharge: No Barriers Identified   Patient Goals and CMS Choice      Discharge Placement                         Discharge Plan and Services Additional resources added to the After Visit Summary for                    DME Agency: NA       HH Arranged: PT, OT HH Agency: [facility] Date [facility] Agency Contacted: 05/24/23 Time HH Agency Contacted: 1100 Representative spoke with at [facility] Agency: Kandee Keen  Social Determinants of Health (SDOH) Interventions SDOH Screenings   Food Insecurity: Patient Unable To Answer (05/21/2023)  Housing: Patient Unable To Answer (05/21/2023)  Transportation Needs: Patient Unable To Answer (05/21/2023)  Utilities: Patient Unable To Answer (05/21/2023)  Alcohol Screen: Low Risk  (05/21/2023)  Depression (PHQ2-9): Low Risk  (12/29/2022)  Financial Resource Strain: Low Risk  (05/21/2023)  Tobacco Use: Medium Risk (05/21/2023)     Readmission Risk Interventions     No data to display

## 2023-05-24 NOTE — Plan of Care (Signed)
  Problem: Education: Goal: Ability to demonstrate management of disease process will improve Outcome: Progressing Goal: Ability to verbalize understanding of medication therapies will improve Outcome: Progressing Goal: Individualized Educational Video(s) Outcome: Progressing   Problem: Activity: Goal: Capacity to carry out activities will improve Outcome: Progressing   Problem: Cardiac: Goal: Ability to achieve and maintain adequate cardiopulmonary perfusion will improve Outcome: Progressing   Problem: Education: Goal: Knowledge of General Education information will improve Description: Including pain rating scale, medication(s)/side effects and non-pharmacologic comfort measures Outcome: Progressing   Problem: Health Behavior/Discharge Planning: Goal: Ability to manage health-related needs will improve Outcome: Progressing   Problem: Clinical Measurements: Goal: Ability to maintain clinical measurements within normal limits will improve Outcome: Progressing Goal: Will remain free from infection Outcome: Progressing Goal: Diagnostic test results will improve Outcome: Progressing Goal: Respiratory complications will improve Outcome: Progressing Goal: Cardiovascular complication will be avoided Outcome: Progressing   Problem: Activity: Goal: Risk for activity intolerance will decrease Outcome: Progressing   Problem: Nutrition: Goal: Adequate nutrition will be maintained Outcome: Progressing   Problem: Coping: Goal: Level of anxiety will decrease Outcome: Progressing   Problem: Elimination: Goal: Will not experience complications related to bowel motility Outcome: Progressing   Problem: Safety: Goal: Ability to remain free from injury will improve Outcome: Progressing   

## 2023-05-24 NOTE — Plan of Care (Signed)
  Problem: Education: Goal: Ability to demonstrate management of disease process will improve 05/24/2023 1048 by Louanne Belton, RN Outcome: Progressing 05/24/2023 0759 by Louanne Belton, RN Outcome: Progressing Goal: Ability to verbalize understanding of medication therapies will improve 05/24/2023 1048 by Louanne Belton, RN Outcome: Progressing 05/24/2023 0759 by Louanne Belton, RN Outcome: Progressing Goal: Individualized Educational Video(s) 05/24/2023 1048 by Louanne Belton, RN Outcome: Progressing 05/24/2023 0759 by Louanne Belton, RN Outcome: Progressing   Problem: Activity: Goal: Capacity to carry out activities will improve 05/24/2023 1048 by Louanne Belton, RN Outcome: Progressing 05/24/2023 0759 by Louanne Belton, RN Outcome: Progressing   Problem: Cardiac: Goal: Ability to achieve and maintain adequate cardiopulmonary perfusion will improve 05/24/2023 1048 by Louanne Belton, RN Outcome: Progressing 05/24/2023 0759 by Louanne Belton, RN Outcome: Progressing   Problem: Education: Goal: Knowledge of General Education information will improve Description: Including pain rating scale, medication(s)/side effects and non-pharmacologic comfort measures 05/24/2023 1048 by Louanne Belton, RN Outcome: Progressing 05/24/2023 0759 by Louanne Belton, RN Outcome: Progressing   Problem: Health Behavior/Discharge Planning: Goal: Ability to manage health-related needs will improve 05/24/2023 1048 by Louanne Belton, RN Outcome: Progressing 05/24/2023 0759 by Louanne Belton, RN Outcome: Progressing   Problem: Clinical Measurements: Goal: Ability to maintain clinical measurements within normal limits will improve 05/24/2023 1048 by Louanne Belton, RN Outcome: Progressing 05/24/2023 0759 by Louanne Belton, RN Outcome: Progressing Goal: Will remain free from infection 05/24/2023 1048 by Louanne Belton, RN Outcome: Progressing 05/24/2023 0759 by Louanne Belton, RN Outcome:  Progressing Goal: Diagnostic test results will improve 05/24/2023 1048 by Louanne Belton, RN Outcome: Progressing 05/24/2023 0759 by Louanne Belton, RN Outcome: Progressing Goal: Respiratory complications will improve 05/24/2023 1048 by Louanne Belton, RN Outcome: Progressing 05/24/2023 0759 by Louanne Belton, RN Outcome: Progressing Goal: Cardiovascular complication will be avoided 05/24/2023 1048 by Louanne Belton, RN Outcome: Progressing 05/24/2023 0759 by Louanne Belton, RN Outcome: Progressing   Problem: Activity: Goal: Risk for activity intolerance will decrease 05/24/2023 1048 by Louanne Belton, RN Outcome: Progressing 05/24/2023 0759 by Louanne Belton, RN Outcome: Progressing   Problem: Nutrition: Goal: Adequate nutrition will be maintained 05/24/2023 1048 by Louanne Belton, RN Outcome: Progressing 05/24/2023 0759 by Louanne Belton, RN Outcome: Progressing   Problem: Coping: Goal: Level of anxiety will decrease 05/24/2023 1048 by Louanne Belton, RN Outcome: Progressing 05/24/2023 0759 by Louanne Belton, RN Outcome: Progressing   Problem: Elimination: Goal: Will not experience complications related to bowel motility 05/24/2023 1048 by Louanne Belton, RN Outcome: Progressing 05/24/2023 0759 by Louanne Belton, RN Outcome: Progressing   Problem: Safety: Goal: Ability to remain free from injury will improve 05/24/2023 1048 by Louanne Belton, RN Outcome: Progressing 05/24/2023 0759 by Louanne Belton, RN Outcome: Progressing

## 2023-05-24 NOTE — Progress Notes (Signed)
   Heart Failure Stewardship Pharmacist Progress Note   PCP: Diana Eves, MD PCP-Cardiologist: Little Ishikawa, MD    HPI:  72 yo M with PMH of CHF, CVA, afib, and dementia.  He was admitted 07/2022 with CVA. 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 Cardiology was consulted for new onset atrial fibrillation with RVR and heart failure. Echocardiogram showed EF 35-40%.   Echocardiogram 01/11/2023 showed EF 20 to 25%, moderate RV dysfunction. Referred to EP with sinus pauses. Not recommending PPM at this time.   Presented to the ED on 5/30 with shortness of breath, orthopnea, and LE edema. BNP elevated. CXR with bibasilar opacities for infection vs atelectasis. ECHO 5/31 showed LVEF 20-25%, global hypokinesis, mild LVH, RV normal, mild MR, mild AR, and a small pericardial effusion (no evidence of tamponade).   Current HF Medications: Diuretic: furosemide 20 mg PO daily SGLT2i: Jardiance 10 mg daily  Prior to admission HF Medications: Diuretic: furosemide 10 mg daily ACE/ARB/ARNI: losartan 12.5 mg daily SGLT2i: Jardiance 10 mg daily  Pertinent Lab Values: Serum creatinine 1.08, BUN 20, Potassium 4.3, Sodium 138, Magnesium 2.4, BNP 1282.2  Vital Signs: Weight: 137 lbs (admission weight: 145 lbs) Blood pressure: 90-110/70s  Heart rate: 60-70s  I/O: incomplete yesterday; net -3.3L  Medication Assistance / Insurance Benefits Check: Does the patient have prescription insurance?  Yes Type of insurance plan: [employer]  Outpatient Pharmacy:  Prior to admission outpatient [facility] Is the patient willing to use [facility] TOC pharmacy at discharge? Yes Is the patient willing to transition their outpatient pharmacy to utilize a [facility] outpatient pharmacy?   Pending    Assessment: 1. Acute on chronic systolic CHF (LVEF 20-25%). Has not had ischemic evaluation done. NYHA class III symptoms. - Continue furosemide 20 mg PO daily. Strict I/Os and daily  weights. Keep K>4 and Mg>2.  - No BB with sinus pauses - GDMT has been challenging to optimize with hypotension  - Continue Jardiance 10 mg daily   Plan: 1) Medication changes recommended at this time: - Continue current regimen  2) Patient assistance: - None pending  3)  Education  - Patient has been educated on current HF medications and potential additions to HF medication regimen - Patient verbalizes understanding that over the next few months, these medication doses may change and more medications may be added to optimize HF regimen - Patient has been educated on basic disease state pathophysiology and goals of therapy   Sharen Hones, PharmD, BCPS Heart Failure Stewardship Pharmacist Phone [phone]

## 2023-05-24 NOTE — Care Management Important Message (Signed)
Important Message  Patient Details  Name: Dustin Blanchard MRN: [id number] Date of Birth: 04-May-1951   Medicare Important Message Given:  Yes     Renie Ora 05/24/2023, 8:29 AM

## 2023-05-25 NOTE — Discharge Summary (Signed)
Physician Discharge Summary   Patient: Dustin Blanchard MRN: [id number] DOB: [birth year]  Admit date:     05/20/2023  Discharge date: 05/24/2023  Discharge Physician: Lynden Oxford  PCP: Diana Eves, MD  Recommendations at discharge: Follow-up with PCP and cardiology as recommended.  Repeat CBC and BMP.   Follow-up Information     [facility]. Go in 17 day(s).   Specialty: Cardiology Why: Hospital follow up 06/09/2023 @ 12 noon PLEASE bring a current medication list to appointment FREE valet parking, Entrance C, off [employer] information: [address] [id number] mc [place] [address] [phone]        Diana Eves, MD Follow up.   Why: Please follow up in a week. Contact information: [address] [place] [address] [phone]         Care, [facility] Follow up.   Specialty: Home Health Services Why: Someone will call you to schedule first home visit. Contact information: [address] [place] [address] [phone]                Discharge Diagnoses: Principal Problem:   Acute on chronic systolic (congestive) heart failure (HCC) Active Problems:   Left middle cerebral artery stroke (HCC)   Cardiomyopathy, nonischemic [facility])  Hospital Course: PMH of chronic combined systolic and diastolic CHF, CVA, with residual deficit, dementia, paroxysmal A-fib on Eliquis presented to the hospital with complaints of shortness of breath.  Found to have acute on chronic combined CHF with weight gain.  Currently responding well to IV Lasix.  Now on room air. Assessment and Plan  Acute on chronic combined systolic and diastolic CHF. Echocardiogram 20 to 25%. No new wall motion abnormality. No new valvular abnormality. Treated with IV Lasix. Volume status improving. Weight improving and now close to baseline. Blood pressure soft.  Orthostatic positive. Monitor  overnight.  Reduce Lasix dose. Home O2 evaluation tomorrow.   Permanent A-fib. Currently rate controlled. Has sinus pause therefore not on any AV nodal blockers. Continue Eliquis.   CVA with aphasia. No new deficit. Continue Eliquis.   HLD. Continue statin.   Consultants:  none  Procedures performed:  Echocardiogram  DISCHARGE MEDICATION: Allergies as of 05/24/2023   No Known Allergies      Medication List     STOP taking these medications    loperamide 2 MG tablet Commonly known as: Imodium A-D       TAKE these medications    acetaminophen 325 MG tablet Commonly known as: TYLENOL Take 2 tablets (650 mg total) by mouth every 4 (four) hours as needed for mild pain (or temp > 37.5 C (99.5 F)).   RA Acetaminophen Ex St 500 MG tablet Generic drug: acetaminophen Take 1 tablet by mouth every six hours as needed   atorvastatin 20 MG tablet Commonly known as: LIPITOR Take 20 mg by mouth daily.   cholecalciferol 10 MCG (400 UNIT) Tabs tablet Commonly known as: VITAMIN D3 Take 2 tablets (800 Units total) by mouth daily.   Eliquis 5 MG Tabs tablet Generic drug: apixaban TAKE 1 TABLET BY MOUTH TWICE A DAY   empagliflozin 10 MG Tabs tablet Commonly known as: Jardiance Take 1 tablet (10 mg total) by mouth daily before breakfast.   furosemide 20 MG tablet Commonly known as: LASIX Take 1 tablet (20 mg total) by mouth daily. Take additional 1 tablet ( 20 mg) for weight gain of 3 lbs in 1 day or  5 lbs in 1 week. What changed: how much to take   losartan 25 MG tablet Commonly known as: COZAAR Take 12.5 mg by mouth daily.   melatonin 3 MG Tabs tablet Take 1 tablet (3 mg total) by mouth at bedtime.   mirtazapine 7.5 MG tablet Commonly known as: REMERON Take 1 tablet (7.5 mg total) by mouth at bedtime.   polyethylene glycol 17 g packet Commonly known as: MIRALAX / GLYCOLAX Take 17 g by mouth daily.   QUEtiapine 25 MG tablet Commonly known as:  SEROQUEL TAKE 1 TABLET (25 MG TOTAL) BY MOUTH DAILY WITH SUPPER. What changed:  when to take this reasons to take this   tamsulosin 0.4 MG Caps capsule Commonly known as: FLOMAX Take 1 capsule (0.4 mg total) by mouth daily.   valproic acid 250 MG/5ML solution Commonly known as: DEPAKENE Take 5 mLs (250 mg total) by mouth at bedtime.   VITAMIN B COMPLEX PO       Disposition: Home Diet recommendation: Cardiac diet  Discharge Exam: Vitals:   05/23/23 1950 05/23/23 2344 05/24/23 0426 05/24/23 0824  BP: 99/71 103/79 110/74 97/67  Pulse: 74 80 64 60  Resp: 16 16 18 18   Temp: 98 F (36.7 C) 98.2 F (36.8 C) 97.8 F (36.6 C) 98.1 F (36.7 C)  TempSrc: Oral Oral Oral Oral  SpO2: 94% 99% 99% 97%  Weight:   62.2 kg   Height:       General: Appear in mild distress; no visible Abnormal Neck Mass Or lumps, Conjunctiva normal Cardiovascular: S1 and S2 Present, aortic systolic Murmur, Respiratory: good respiratory effort, Bilateral Air entry present and CTA, no Crackles, no wheezes Abdomen: Bowel Sound present, Non tender  Extremities: no Pedal edema Neurology: alert and oriented to time, place, and person  Filed Weights   05/22/23 0522 05/23/23 0528 05/24/23 0426  Weight: 63.1 kg 62.2 kg 62.2 kg   Condition at discharge: stable  The results of significant diagnostics from this hospitalization (including imaging, microbiology, ancillary and laboratory) are listed below for reference.   Imaging Studies: ECHOCARDIOGRAM COMPLETE  Result Date: 05/21/2023    ECHOCARDIOGRAM REPORT   Patient Name:   Dustin Blanchard Date of Exam: 05/21/2023 Medical Rec #:  [id number]      Height:       73.0 in Accession #:    [id number]     Weight:       145.6 lb Date of Birth:  01-04-1951       BSA:          1.880 m Patient Age:    72 years       BP:           116/84 mmHg Patient Gender: M              HR:           91 bpm. Exam Location:  Inpatient Procedure: 2D Echo, Cardiac Doppler, Color Doppler and  Intracardiac            Opacification Agent Indications:    CHF  History:        Patient has prior history of Echocardiogram examinations, most                 recent 01/11/2023. CHF and Cardiomyopathy, Stroke;                 Arrythmias:Atrial Flutter and Atrial Fibrillation.  Sonographer:    Lucy Antigua Referring Phys: [id number] Dorcas Carrow  IMPRESSIONS  1. Left ventricular ejection fraction, by estimation, is 20 to 25%. The left ventricle has severely decreased function. The left ventricle demonstrates global hypokinesis. There is mild left ventricular hypertrophy of the septal segment. Left ventricular diastolic function could not be evaluated.  2. Right ventricular systolic function is normal. The right ventricular size is normal. There is moderately elevated pulmonary artery systolic pressure.  3. Left atrial size was severely dilated.  4. A small pericardial effusion is present. There is no evidence of cardiac tamponade. Large pleural effusion.  5. The mitral valve is normal in structure. Mild mitral valve regurgitation. No evidence of mitral stenosis.  6. The aortic valve is tricuspid. Aortic valve regurgitation is mild. No aortic stenosis is present.  7. There is borderline dilatation of the aortic root, measuring 39 mm. There is mild (Grade II) atheroma plaque involving the aortic root.  8. The inferior vena cava is dilated in size with <50% respiratory variability, suggesting right atrial pressure of 15 mmHg. FINDINGS  Left Ventricle: Left ventricular ejection fraction, by estimation, is 20 to 25%. The left ventricle has severely decreased function. The left ventricle demonstrates global hypokinesis. The left ventricular internal cavity size was normal in size. There is mild left ventricular hypertrophy of the septal segment. Left ventricular diastolic function could not be evaluated due to atrial fibrillation. Left ventricular diastolic function could not be evaluated. Indeterminate filling pressures.  Right Ventricle: The right ventricular size is normal. No increase in right ventricular wall thickness. Right ventricular systolic function is normal. There is moderately elevated pulmonary artery systolic pressure. The tricuspid regurgitant velocity is 2.86 m/s, and with an assumed right atrial pressure of 15 mmHg, the estimated right ventricular systolic pressure is 47.7 mmHg. Left Atrium: Left atrial size was severely dilated. Right Atrium: Right atrial size was normal in size. Pericardium: A small pericardial effusion is present. There is no evidence of cardiac tamponade. Mitral Valve: The mitral valve is normal in structure. Mild mitral valve regurgitation. No evidence of mitral valve stenosis. Tricuspid Valve: The tricuspid valve is normal in structure. Tricuspid valve regurgitation is trivial. No evidence of tricuspid stenosis. Aortic Valve: The aortic valve is tricuspid. Aortic valve regurgitation is mild. No aortic stenosis is present. Aortic valve mean gradient measures 2.0 mmHg. Aortic valve peak gradient measures 2.4 mmHg. Aortic valve area, by VTI measures 2.96 cm. Pulmonic Valve: The pulmonic valve was normal in structure. Pulmonic valve regurgitation is trivial. No evidence of pulmonic stenosis. Aorta: The aortic root is normal in size and structure. There is borderline dilatation of the aortic root, measuring 39 mm. There is mild (Grade II) atheroma plaque involving the aortic root. Venous: The inferior vena cava is dilated in size with less than 50% respiratory variability, suggesting right atrial pressure of 15 mmHg. IAS/Shunts: No atrial level shunt detected by color flow Doppler. Additional Comments: There is a large pleural effusion.  LEFT VENTRICLE PLAX 2D LVIDd:         5.50 cm   Diastology LVIDs:         4.60 cm   LV e' medial:    6.22 cm/s LV PW:         1.00 cm   LV E/e' medial:  12.6 LV IVS:        1.20 cm   LV e' lateral:   6.22 cm/s LVOT diam:     2.30 cm   LV E/e' lateral: 12.6 LV SV:  37 LV SV Index:   20 LVOT Area:     4.15 cm  IVC IVC diam: 2.00 cm LEFT ATRIUM              Index        RIGHT ATRIUM           Index LA Vol (A2C):   87.7 ml  46.65 ml/m  RA Area:     24.80 cm LA Vol (A4C):   110.0 ml 58.51 ml/m  RA Volume:   81.20 ml  43.19 ml/m LA Biplane Vol: 103.0 ml 54.79 ml/m  AORTIC VALVE AV Area (Vmax):    2.82 cm AV Area (Vmean):   2.82 cm AV Area (VTI):     2.96 cm AV Vmax:           77.50 cm/s AV Vmean:          60.400 cm/s AV VTI:            0.125 m AV Peak Grad:      2.4 mmHg AV Mean Grad:      2.0 mmHg LVOT Vmax:         52.60 cm/s LVOT Vmean:        41.000 cm/s LVOT VTI:          0.089 m LVOT/AV VTI ratio: 0.71  AORTA Ao Root diam: 3.90 cm Ao Asc diam:  3.30 cm MITRAL VALVE               TRICUSPID VALVE MV Area (PHT): 4.41 cm    TR Peak grad:   32.7 mmHg MV Decel Time: 172 msec    TR Vmax:        286.00 cm/s MR Peak grad: 79.9 mmHg MR Vmax:      447.00 cm/s  SHUNTS MV E velocity: 78.60 cm/s  Systemic VTI:  0.09 m                            Systemic Diam: 2.30 cm Chilton Si MD Electronically signed by Chilton Si MD Signature Date/Time: 05/21/2023/11:52:20 AM    Final    DG Chest Portable 1 View  Result Date: 05/20/2023 CLINICAL DATA:  Shortness of breath. EXAM: PORTABLE CHEST 1 VIEW COMPARISON:  August 13, 2022. FINDINGS: Stable cardiomegaly. Mild bibasilar atelectasis or infiltrates are noted. Left lung base is not entirely included in field-of-view. Elevated right hemidiaphragm is unremarkable. Bony thorax is unremarkable. IMPRESSION: Bibasilar opacities are noted concerning for atelectasis or possibly pneumonia. Elevated right hemidiaphragm. Electronically Signed   By: Lupita Raider M.D.   On: 05/20/2023 16:48    Microbiology: Results for orders placed or performed during the hospital encounter of 08/12/22  Urine Culture     Status: Abnormal   Collection Time: 08/13/22 11:07 PM   Specimen: In/Out Cath Urine  Result Value Ref Range Status    Specimen Description IN/OUT CATH URINE  Final   Special Requests   Final    Normal Performed at [facility], [address], [place], [place] [address]    Culture 7,000 COLONIES/mL ENTEROBACTER CLOACAE (A)  Final   Report Status 08/15/2022 FINAL  Final   Organism ID, Bacteria ENTEROBACTER CLOACAE (A)  Final      Susceptibility   Enterobacter cloacae - MIC*    CEFAZOLIN >=64 RESISTANT Resistant     CEFEPIME <=0.12 SENSITIVE Sensitive     CIPROFLOXACIN <=0.25 SENSITIVE Sensitive  GENTAMICIN <=1 SENSITIVE Sensitive     IMIPENEM <=0.25 SENSITIVE Sensitive     NITROFURANTOIN 64 INTERMEDIATE Intermediate     TRIMETH/SULFA >=320 RESISTANT Resistant     PIP/TAZO <=4 SENSITIVE Sensitive     * 7,000 COLONIES/mL ENTEROBACTER CLOACAE   Labs: CBC: Recent Labs  Lab 05/20/23 1635 05/24/23 0133  WBC 5.2 5.8  HGB 14.7 14.6  HCT 44.4 44.7  MCV 98.2 97.2  PLT 196 202   Basic Metabolic Panel: Recent Labs  Lab 05/20/23 1635 05/21/23 0027 05/22/23 0050 05/23/23 0946 05/24/23 0133  NA 142 141 141 140 138  K 4.4 4.1 4.8 3.9 4.3  CL 107 109 108 106 104  CO2 25 24 25 26 26   GLUCOSE 93 136* 104* 116* 91  BUN 11 10 15 19 20   CREATININE 1.10 1.08 1.06 1.14 1.08  CALCIUM 9.1 9.2 9.3 9.8 8.9  MG  --   --  2.4 2.4 2.4   Liver Function Tests: Recent Labs  Lab 05/24/23 0133  AST 23  ALT 22  ALKPHOS 54  BILITOT 1.0  PROT 5.6*  ALBUMIN 3.2*   CBG: Recent Labs  Lab 05/21/23 0527  GLUCAP 96    Discharge time spent: greater than 30 minutes.  Signed: Lynden Oxford, MD [facility] 05/24/2023

## 2023-06-02 ENCOUNTER — Other Ambulatory Visit: Payer: Self-pay | Admitting: Adult Health

## 2023-06-08 ENCOUNTER — Other Ambulatory Visit: Payer: Self-pay | Admitting: *Deleted

## 2023-06-08 NOTE — Telephone Encounter (Signed)
Patient has appointment with Dr.Schumann 6/28 at 8:20 am for pre op clearance.

## 2023-06-09 ENCOUNTER — Encounter (HOSPITAL_COMMUNITY): Payer: Self-pay

## 2023-06-09 ENCOUNTER — Ambulatory Visit (HOSPITAL_COMMUNITY)
Admit: 2023-06-09 | Discharge: 2023-06-09 | Disposition: A | Discharge status: Home / Self Care | Payer: Medicare HMO | Source: Ambulatory Visit | Attending: Physician Assistant | Admitting: Physician Assistant

## 2023-06-09 VITALS — BP 102/60 | HR 64 | Wt 138.8 lb

## 2023-06-09 DIAGNOSIS — F0390 Unspecified dementia without behavioral disturbance: Secondary | ICD-10-CM | POA: Diagnosis not present

## 2023-06-09 DIAGNOSIS — I69320 Aphasia following cerebral infarction: Secondary | ICD-10-CM | POA: Insufficient documentation

## 2023-06-09 DIAGNOSIS — I5022 Chronic systolic (congestive) heart failure: Secondary | ICD-10-CM | POA: Insufficient documentation

## 2023-06-09 DIAGNOSIS — I5020 Unspecified systolic (congestive) heart failure: Secondary | ICD-10-CM

## 2023-06-09 DIAGNOSIS — I4892 Unspecified atrial flutter: Secondary | ICD-10-CM | POA: Diagnosis not present

## 2023-06-09 DIAGNOSIS — Z87891 Personal history of nicotine dependence: Secondary | ICD-10-CM | POA: Insufficient documentation

## 2023-06-09 DIAGNOSIS — I63412 Cerebral infarction due to embolism of left middle cerebral artery: Secondary | ICD-10-CM | POA: Diagnosis not present

## 2023-06-09 DIAGNOSIS — Z950 Presence of cardiac pacemaker: Secondary | ICD-10-CM | POA: Insufficient documentation

## 2023-06-09 DIAGNOSIS — Z8249 Family history of ischemic heart disease and other diseases of the circulatory system: Secondary | ICD-10-CM | POA: Diagnosis not present

## 2023-06-09 DIAGNOSIS — Z7901 Long term (current) use of anticoagulants: Secondary | ICD-10-CM | POA: Insufficient documentation

## 2023-06-09 DIAGNOSIS — Z79899 Other long term (current) drug therapy: Secondary | ICD-10-CM | POA: Insufficient documentation

## 2023-06-09 DIAGNOSIS — I4891 Unspecified atrial fibrillation: Secondary | ICD-10-CM | POA: Diagnosis not present

## 2023-06-09 LAB — COMPREHENSIVE METABOLIC PANEL
ALT: 21 U/L (ref 0–44)
AST: 20 U/L (ref 15–41)
Albumin: 3.5 g/dL (ref 3.5–5.0)
Alkaline Phosphatase: 54 U/L (ref 38–126)
Anion gap: 8 (ref 5–15)
BUN: 16 mg/dL (ref 8–23)
CO2: 24 mmol/L (ref 22–32)
Calcium: 9.4 mg/dL (ref 8.9–10.3)
Chloride: 110 mmol/L (ref 98–111)
Creatinine, Ser: 1.01 mg/dL (ref 0.61–1.24)
GFR, Estimated: >60 mL/min (ref >60)
Glucose, Bld: 104 mg/dL — ABNORMAL HIGH (ref 70–99)
Potassium: 4.2 mmol/L (ref 3.5–5.1)
Sodium: 142 mmol/L (ref 135–145)
Total Bilirubin: 0.7 mg/dL (ref 0.3–1.2)
Total Protein: 5.9 g/dL — ABNORMAL LOW (ref 6.5–8.1)

## 2023-06-09 LAB — BRAIN NATRIURETIC PEPTIDE: B Natriuretic Peptide: 817.4 pg/mL — ABNORMAL HIGH (ref 0.0–100.0)

## 2023-06-09 NOTE — Progress Notes (Addendum)
HEART & VASCULAR TRANSITION OF CARE CONSULT NOTE     Referring Physician: Dr. Allena Katz Primary Care: Dr. Diana Eves Primary Cardiologist: Dr. Bjorn Pippin  HPI: Referred to clinic by Dr. Allena Katz with [facility] for heart failure consultation. 72 y.o. male with history of HFrEF, prior CVA with expressive aphasia, atrial fibrillation/flutter, dementia (SLUMs 6/30 in 01/23).  Echo 2019: EF 45%  Admitted 08/23 with embolic CVA.  Found to have left MCA infarct d/t left M3 occlusion treated with TNK and thrombectomy. Cardioembolic source from Afib suspected.Had new Afib with RVR and EF 35-40% on echo. Cardiology consulted. He had bradycardia and posttermination pauses. He was seen by EP and switched to amiodarone.   Echo 01/24: EF 20-25%, RV moderately reduced, mild to moderate MR, mild AI.   3 day Zio 02/24: Atrial flutter with variable rates 29-137k and pauses up to 4 seconds.   He saw EP in April. Pauses appeared to occur during sleep hours. Pacemaker not felt to be indicated.   He has been followed by Dr. Bjorn Pippin. Invasive workup for cardiomyopathy has not been pursued d/t cognitive impairment. He is oriented to person only. GDMT limited by hypotension.   Admitted 05/30-06/04 with acute on chronic CHF. Had been eating out occasionally and eating some canned foods. Diuresed with IV lasix. Discharged on Jardiance 10 mg daily, Losartan 12.5 mg daily and Lasix 20 mg daily.   He is here today for hospital follow-up.  Patient is accompanied by his wife who provides most of the history. He is oriented to self only. Able to follow simple commands in clinic today. His wife reports he seems less short of breath and is no longer having orthopnea. He is taking all medications as prescribed. No reports of dizziness. His weight has been stable in low 130s since discharge. Lost about 20 lb from 2022 to 2023. Weight has been stable in 130s over the last year.  He has family history of CHF in his mother and  several other maternal relatives. His sibling has passed, unknown if had CHF. He has 3 children, none of them have known HF.  He quit smoking several years ago and has not consumed ETOH in several years.   Past Medical History:  Diagnosis Date   Aphasia as late effect of stroke    Atrial flutter (HCC)    Cardiomyopathy, nonischemic (HCC)    Dementia (HCC)    Stroke (HCC)     Current Outpatient Medications  Medication Sig Dispense Refill   acetaminophen (TYLENOL) 325 MG tablet Take 2 tablets (650 mg total) by mouth every 4 (four) hours as needed for mild pain (or temp > 37.5 C (99.5 F)).     apixaban (ELIQUIS) 5 MG TABS tablet TAKE 1 TABLET BY MOUTH TWICE A DAY 60 tablet 5   atorvastatin (LIPITOR) 20 MG tablet Take 20 mg by mouth daily.     B Complex Vitamins (VITAMIN B COMPLEX PO)      Cholecalciferol (VITAMIN D-3) 125 MCG (5000 UT) TABS Take 1 tablet by mouth daily.     empagliflozin (JARDIANCE) 10 MG TABS tablet Take 1 tablet (10 mg total) by mouth daily before breakfast. 30 tablet 11   furosemide (LASIX) 20 MG tablet Take 1 tablet (20 mg total) by mouth daily. Take additional 1 tablet ( 20 mg) for weight gain of 3 lbs in 1 day or 5 lbs in 1 week. 30 tablet 0   melatonin 3 MG TABS tablet Take 1 tablet (3 mg  total) by mouth at bedtime. 30 tablet 0   mirtazapine (REMERON) 7.5 MG tablet Take 1 tablet (7.5 mg total) by mouth at bedtime. 90 tablet 3   polyethylene glycol (MIRALAX / GLYCOLAX) 17 g packet Take 17 g by mouth daily. 14 each 0   QUEtiapine (SEROQUEL) 25 MG tablet Take 25 mg by mouth as needed.     RA ACETAMINOPHEN EX ST 500 MG tablet Take 1 tablet by mouth every six hours as needed     tamsulosin (FLOMAX) 0.4 MG CAPS capsule Take 1 capsule (0.4 mg total) by mouth daily. 30 capsule 0   valproic acid (DEPAKENE) 250 MG/5ML solution TAKE 5 MLS (250 MG TOTAL) BY MOUTH AT BEDTIME. 900 mL 1   No current facility-administered medications for this encounter.    No Known  Allergies    Social History   Socioeconomic History   Marital status: Married    Spouse name: Rinaldo Cloud   Number of children: 3   Years of education: Not on file   Highest education level: Not on file  Occupational History   Occupation: Retired  Tobacco Use   Smoking status: Former   Smokeless tobacco: Never  Building services engineer Use: Never used  Substance and Sexual Activity   Alcohol use: No   Drug use: Not Currently   Sexual activity: Never  Other Topics Concern   Not on file  Social History Narrative   Not on file   Social Determinants of Health   Financial Resource Strain: Low Risk  (05/21/2023)   Overall Financial Resource Strain (CARDIA)    Difficulty of Paying Living Expenses: Not very hard  Food Insecurity: Patient Unable To Answer (05/21/2023)   Hunger Vital Sign    Worried About Running Out of Food in the Last Year: Patient unable to answer    Ran Out of Food in the Last Year: Patient unable to answer  Transportation Needs: Patient Unable To Answer (05/21/2023)   PRAPARE - Transportation    Lack of Transportation (Medical): Patient unable to answer    Lack of Transportation (Non-Medical): Patient unable to answer  Physical Activity: Not on file  Stress: Not on file  Social Connections: Not on file  Intimate Partner Violence: Patient Unable To Answer (05/21/2023)   Humiliation, Afraid, Rape, and Kick questionnaire    Fear of Current or Ex-Partner: Patient unable to answer    Emotionally Abused: Patient unable to answer    Physically Abused: Patient unable to answer    Sexually Abused: Patient unable to answer     History reviewed. No pertinent family history.  Vitals:   06/09/23 1157  BP: 102/60  Pulse: 64  SpO2: 97%  Weight: 63 kg (138 lb 12.8 oz)    PHYSICAL EXAM: General:  Thin, chronically ill appearing elderly male HEENT: normal Neck: supple. no JVD. Carotids 2+ bilat; no bruits.  Cor: PMI nondisplaced. Irregularrhythm. No rubs, gallops or  murmurs. Lungs: clear Abdomen: soft, nontender, nondistended.  Extremities: no cyanosis, clubbing, rash, edema Neuro: Alert. Oriented to self only. Expressive aphasia noted.  ECG: Atrial flutter with variable AV block, 64 bpm   ASSESSMENT & PLAN: HFrEF -Echo 2019: EF 45% -Echo 2023 in setting of new Afib: EF 35-40% -Echo 01/24: EF 20-25%, RV moderately reduced, mild to moderate MR, mild AI.  -Echo 05/24: EF 20-25%, RV okay, severe LAE, mild MR, dilated IVC with estimated RAP 15 mmHg -Etiology not certain. ? How much atrial flutter contributing. Has not had ischemic  workup d/t his cognitive impairment.  -Given the combination of cardiomyopathy, orthostatic hypotension and arrhythmias worry about possibility of hATTR amyloid. He does have a family history of CHF. Discussed with Dr. Gala Romney. He appears end-stage and has multiple comorbid conditions. Would not be a good candidate for Tafamidis. However, could consider [facility] testing for hereditary hATTR and other genetic cardiomyopathies if his family desires (has 3 children). His family will discuss this and decide. NYHA very difficult to assess d/t cognitive impairment GDMT  Diuretic-Volume appears stable. Continue lasix 20 mg daily BB-No, history of bradycardia Ace/ARB/ARNI-No previously stopped d/t hypotenison MRA-No previously stopped d/t hypotension SGLT2i-Jardiance 10 mg daily, watch for UTIs (last UTI in 01/24) -BP borderline today. BP 90s-100s during recent admit. His wife will keep a log of his blood pressure until his follow-up with Cardiology later this month. If BP is stable, could consider retrial with spironolactone -Labs today  2. Persistent atrial flutter -Atrial flutter with controlled rate today -No AV nodal blocker d/t history of bradycardia and pauses. Followed by EP -Continue Eliquis 5 mg BID  3. CVA -08/23: Left MCA infarct d/t left M3 occlusion treated with TNK and thrombectomy. Cardioembolic source from Afib  suspected. -Now anticoagulated  5. Dementia -Scored 6/30 on SLUMs in 2023 -Oriented to self only at today's visit  Referred to HFSW (PCP, Medications, Transportation, ETOH Abuse, Drug Abuse, Insurance, Financial ): No Refer to Pharmacy: No Refer to Home Health: No Refer to Advanced Heart Failure Clinic: No Refer to General Cardiology: No  Follow up  As needed, follow-up with Cardiology as scheduled 06/28

## 2023-06-09 NOTE — Patient Instructions (Signed)
Thank you for your visit today.  Thank you for allowing [place] to provider your heart failure care after your recent hospitalization. Please follow-up with Dr. Bjorn Pippin as scheduled.  If you have any questions, issues, or concerns before your next appointment please call our office at [phone], opt. 2 and leave a message for the triage nurse.

## 2023-06-18 ENCOUNTER — Ambulatory Visit: Payer: Medicare HMO | Attending: Cardiology | Admitting: Cardiology

## 2023-06-18 ENCOUNTER — Encounter: Payer: Self-pay | Admitting: Cardiology

## 2023-06-18 ENCOUNTER — Telehealth: Payer: Self-pay | Admitting: Cardiology

## 2023-06-18 VITALS — BP 100/60 | HR 70 | Ht 74.0 in | Wt 139.8 lb

## 2023-06-18 DIAGNOSIS — I340 Nonrheumatic mitral (valve) insufficiency: Secondary | ICD-10-CM

## 2023-06-18 DIAGNOSIS — I5042 Chronic combined systolic (congestive) and diastolic (congestive) heart failure: Secondary | ICD-10-CM

## 2023-06-18 DIAGNOSIS — E785 Hyperlipidemia, unspecified: Secondary | ICD-10-CM

## 2023-06-18 DIAGNOSIS — I4892 Unspecified atrial flutter: Secondary | ICD-10-CM | POA: Diagnosis not present

## 2023-06-18 DIAGNOSIS — R350 Frequency of micturition: Secondary | ICD-10-CM

## 2023-06-18 NOTE — Telephone Encounter (Signed)
Paper Work Dropped Off: Prescription Assistance forms          Also asked about fax from dentist for procedure approval  Date: 06.28.24 @ 9:45  Location of paper: Dr [employer]

## 2023-06-18 NOTE — Patient Instructions (Addendum)
Medication Instructions:  PATIENT ASSISTANCE FORMS FOR ELIQUIS AND JARDIANCE GIVEN *If you need a refill on your cardiac medications before your next appointment, please call your pharmacy*   Lab Work: TODAY-BMET, MAG,U/A AND CULTURE If you have labs (blood work) drawn today and your tests are completely normal, you will receive your results only by: MyChart Message (if you have MyChart) OR A paper copy in the mail If you have any lab test that is abnormal or we need to change your treatment, we will call you to review the results.   Testing/Procedures: NONE ORDERED   Follow-Up: At [facility], you and your health needs are our priority.  As part of our continuing mission to provide you with exceptional heart care, we have created designated Provider Care Teams.  These Care Teams include your primary Cardiologist (physician) and Advanced Practice Providers (APPs -  Physician Assistants and Nurse Practitioners) who all work together to provide you with the care you need, when you need it.  We recommend signing up for the patient portal called "MyChart".  Sign up information is provided on this After Visit Summary.  MyChart is used to connect with patients for Virtual Visits (Telemedicine).  Patients are able to view lab/test results, encounter notes, upcoming appointments, etc.  Non-urgent messages can be sent to your provider as well.   To learn more about what you can do with MyChart, go to [email or web address].    Your next appointment:   3 month(s)  Provider:   Little Ishikawa, MD     Other Instructions

## 2023-06-18 NOTE — Progress Notes (Signed)
Cardiology Office Note:    Date:  06/18/2023   ID:  Dustin Blanchard, DOB November 19, 1951, MRN [id number]  PCP:  Diana Eves, MD  Cardiologist:  Little Ishikawa, MD  Electrophysiologist:  None   Referring MD: [facility]   No chief complaint on file.   History of Present Illness:    Dustin Blanchard is a 72 y.o. male with a hx of chronic combined systolic and diastolic heart failure, CVA, atrial fibrillation, dementia (SLUMs 6/30 in 01/23) who presents from follow-up.  He was admitted 07/2022 with CVA.  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  Cardiology was consulted for new onset atrial fibrillation with RVR and heart failure.  Echocardiogram showed EF 35-40%.  He was started on Cardizem drip initially and became bradycardic, had sinus pauses up to 4.7 seconds.  EP was consulted and recommended amiodarone to try and maintain sinus rhythm.  Echocardiogram 01/11/2023 showed EF 20 to 25%, moderate RV dysfunction, small pericardial effusion, mild to moderate MR, mild AI, mild dilatation of aortic root measuring 40 mm.  Zio patch x 3 days 01/2023 showed 100% atrial flutter burden, average rate 67 bpm; 17 pauses with longest lasting 4 seconds.  Echo 05/21/2023 showed EF 20 to 25%, normal RV function, moderate elevation in pulmonary pressures, severe left atrial enlargement, small pericardial effusion, large pleural effusion, mild mitral regurgitation, mild aortic regurgitation.  Since last clinic visit, he was admitted 05/20/2023 through 05/24/2023 with decompensated heart failure.  Improved with IV Lasix.  He was discharged on Lasix 20 mg daily.  Since discharge, wife reports he is doing better.  Does not appear short of breath.  He denies any chest pain or shortness of breath.  Home BP log shows BP 100s to 110s over 60s to 80s.  Weight has been stable.  No bleeding issues.  Wife does report that he seems to be having urinary frequency and noted odor to urine, she is  concerned about UTI.   Wt Readings from Last 3 Encounters:  06/18/23 139 lb 12.8 oz (63.4 kg)  06/09/23 138 lb 12.8 oz (63 kg)  05/24/23 137 lb 2 oz (62.2 kg)      BP Readings from Last 3 Encounters:  06/18/23 100/60  06/09/23 102/60  05/24/23 97/67    Past Medical History:  Diagnosis Date   Aphasia as late effect of stroke    Atrial flutter (HCC)    Cardiomyopathy, nonischemic (HCC)    Dementia (HCC)    Stroke [facility])     Past Surgical History:  Procedure Laterality Date   HERNIA REPAIR     HIP SURGERY     IR CT HEAD LTD  08/06/2022   IR CT HEAD LTD  08/06/2022   IR PERCUTANEOUS ART THROMBECTOMY/INFUSION INTRACRANIAL INC DIAG ANGIO  08/06/2022   IR US GUIDE VASC ACCESS RIGHT  08/06/2022   RADIOLOGY WITH ANESTHESIA N/A 08/06/2022   Procedure: IR WITH ANESTHESIA;  Surgeon: Julieanne Cotton, MD;  Location: [facility] OR;  Service: Radiology;  Laterality: N/A;    Current Medications: Current Meds  Medication Sig   acetaminophen (TYLENOL) 325 MG tablet Take 2 tablets (650 mg total) by mouth every 4 (four) hours as needed for mild pain (or temp > 37.5 C (99.5 F)).   apixaban (ELIQUIS) 5 MG TABS tablet TAKE 1 TABLET BY MOUTH TWICE A DAY   atorvastatin (LIPITOR) 20 MG tablet Take 20 mg by mouth daily.   B Complex Vitamins (VITAMIN B COMPLEX PO)  Cholecalciferol (VITAMIN D-3) 125 MCG (5000 UT) TABS Take 1 tablet by mouth daily.   empagliflozin (JARDIANCE) 10 MG TABS tablet Take 1 tablet (10 mg total) by mouth daily before breakfast.   furosemide (LASIX) 20 MG tablet Take 1 tablet (20 mg total) by mouth daily. Take additional 1 tablet ( 20 mg) for weight gain of 3 lbs in 1 day or 5 lbs in 1 week.   melatonin 3 MG TABS tablet Take 1 tablet (3 mg total) by mouth at bedtime.   mirtazapine (REMERON) 7.5 MG tablet Take 1 tablet (7.5 mg total) by mouth at bedtime.   polyethylene glycol (MIRALAX / GLYCOLAX) 17 g packet Take 17 g by mouth daily.   QUEtiapine (SEROQUEL) 25 MG tablet Take 25 mg  by mouth as needed.   RA ACETAMINOPHEN EX ST 500 MG tablet Take 1 tablet by mouth every six hours as needed   tamsulosin (FLOMAX) 0.4 MG CAPS capsule Take 1 capsule (0.4 mg total) by mouth daily.   valproic acid (DEPAKENE) 250 MG/5ML solution TAKE 5 MLS (250 MG TOTAL) BY MOUTH AT BEDTIME.     Allergies:   Patient has no known allergies.   Social History   Socioeconomic History   Marital status: Married    Spouse name: Rinaldo Cloud   Number of children: 3   Years of education: Not on file   Highest education level: Not on file  Occupational History   Occupation: Retired  Tobacco Use   Smoking status: Former   Smokeless tobacco: Never  Building services engineer Use: Never used  Substance and Sexual Activity   Alcohol use: No   Drug use: Not Currently   Sexual activity: Never  Other Topics Concern   Not on file  Social History Narrative   Not on file   Social Determinants of Health   Financial Resource Strain: Low Risk  (05/21/2023)   Overall Financial Resource Strain (CARDIA)    Difficulty of Paying Living Expenses: Not very hard  Food Insecurity: Patient Unable To Answer (05/21/2023)   Hunger Vital Sign    Worried About Running Out of Food in the Last Year: Patient unable to answer    Ran Out of Food in the Last Year: Patient unable to answer  Transportation Needs: Patient Unable To Answer (05/21/2023)   PRAPARE - Administrator, Civil Service (Medical): Patient unable to answer    Lack of Transportation (Non-Medical): Patient unable to answer  Physical Activity: Not on file  Stress: Not on file  Social Connections: Not on file     Family History: The patient's family history is not on file.  ROS:   Please see the history of present illness.     All other systems reviewed and are negative.  EKGs/Labs/Other Studies Reviewed:    The following studies were reviewed today:   EKG:   06/18/2023: Atrial flutter, rate 70, LVH, PVCs 01/25/2023: Atrial fibrillation, rate  43 02/01/23: Atrial flutter, rate 71  Recent Labs: 05/20/2023: TSH 2.003 05/24/2023: Hemoglobin 14.6; Magnesium 2.4; Platelets 202 06/09/2023: ALT 21; B Natriuretic Peptide 817.4; BUN 16; Creatinine, Ser 1.01; Potassium 4.2; Sodium 142  Recent Lipid Panel    Component Value Date/Time   CHOL 130 03/02/2023 1443   TRIG 86 03/02/2023 1443   HDL 51 03/02/2023 1443   CHOLHDL 2.5 03/02/2023 1443   CHOLHDL 2.8 08/07/2022 0241   VLDL 12 08/07/2022 0241   LDLCALC 62 03/02/2023 1443    Physical Exam:  VS:  BP 100/60   Pulse 70   Ht 6\' 2"  (1.88 m)   Wt 139 lb 12.8 oz (63.4 kg)   SpO2 100%   BMI 17.95 kg/m     Wt Readings from Last 3 Encounters:  06/18/23 139 lb 12.8 oz (63.4 kg)  06/09/23 138 lb 12.8 oz (63 kg)  05/24/23 137 lb 2 oz (62.2 kg)     GEN:   in no acute distress HEENT: Normal NECK: No JVD; No carotid bruits CARDIAC: Irregular, bradycardic no murmurs, rubs, gallops RESPIRATORY:  Clear to auscultation without rales, wheezing or rhonchi  ABDOMEN: Soft, non-tender, non-distended MUSCULOSKELETAL:  No edema; No deformity  SKIN: Warm and dry NEUROLOGIC:  Alert and oriented x 1.  Aphasia  PSYCHIATRIC:  Normal affect   ASSESSMENT:    1. Chronic combined systolic (congestive) and diastolic (congestive) heart failure (HCC)   2. Atrial flutter with controlled response (HCC)   3. Atrial flutter, unspecified type (HCC)   4. Hyperlipidemia, unspecified hyperlipidemia type   5. Mitral valve insufficiency, unspecified etiology   6. Urinary frequency     PLAN:    Chronic combined systolic and diastolic heart failure: Echocardiogram during admission 07/2022 showed EF 35 to 40%.  Echocardiogram 01/11/2023 showed EF 20 to 25%, moderate RV dysfunction, small pericardial effusion, mild to moderate MR, mild AI, mild dilatation of aortic root measuring 40 mm.  Echo 05/21/2023 showed EF 20 to 25%, normal RV function, moderate elevation in pulmonary pressures, severe left atrial  enlargement, small pericardial effusion, large pleural effusion, mild mitral regurgitation, mild aortic regurgitation. -GDMT has been limited due to soft blood pressure.  Currently on Jardiance 10 mg daily.  Would hold off on beta-blocker given low resting heart rate.  Wife is concerned he might have a UTI.  Will check UA/urine culture.  If UTI, would discontinue Jardiance -Continue Lasix 20 mg daily.  Check BMET, magnesium -Not a good candidate for invasive evaluation due to his comorbidities including dementia, he is oriented to person only.  Will plan medical management.  Unfortunately prognosis is poor given his severe heart failure.  Discussed referral to palliative care with his wife today, she would like to think about it  Atrial fibrillation/flutter: Presented with A-fib with RVR and acute CVA 07/2022.  He was started on Cardizem drip initially and became bradycardic, had sinus pauses up to 4.7 seconds.  EP was consulted and recommended amiodarone to try and maintain sinus rhythm. -Continue Eliquis 5 mg twice daily -At clinic visit 01/25/2023, was back in A-fib and bradycardic to 40s.  Discontinued amiodarone.  Zio patch x 3 days 01/2023 showed heart presented to flutter burden, average rate 67 bpm; 17 pauses with longest lasting 4 seconds.  Referred to EP, did not recommend pacing at this time  Hyperlipidemia: On atorvastatin 20 mg daily.  LDL 62 on 03/02/2023 l  Mitral regurgitation: Moderate MR on echocardiogram.  Will monitor  Acute CVA: He was admitted 07/2022 with CVA.  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  Found to have atrial fibrillation as above -Continue Eliquis, statin  Dementia: Scored 6/30 on SLUMs in 2023.  Currently oriented to self only  Urinary frequency: Check UA/urine culture.  Would discontinue Jardiance if having UTI  RTC in 3 months    Medication Adjustments/Labs and Tests Ordered: Current medicines are reviewed at length with the patient  today.  Concerns regarding medicines are outlined above.  Orders Placed This Encounter  Procedures   EKG 12-Lead  No orders of the defined types were placed in this encounter.   Patient Instructions  Medication Instructions:  PATIENT ASSISTANCE FORMS FOR ELIQUIS AND JARDIANCE GIVEN *If you need a refill on your cardiac medications before your next appointment, please call your pharmacy*   Lab Work: TODAY-BMET, MAG,U/A AND CULTURE If you have labs (blood work) drawn today and your tests are completely normal, you will receive your results only by: MyChart Message (if you have MyChart) OR A paper copy in the mail If you have any lab test that is abnormal or we need to change your treatment, we will call you to review the results.   Testing/Procedures: NONE ORDERED   Follow-Up: At [facility], you and your health needs are our priority.  As part of our continuing mission to provide you with exceptional heart care, we have created designated Provider Care Teams.  These Care Teams include your primary Cardiologist (physician) and Advanced Practice Providers (APPs -  Physician Assistants and Nurse Practitioners) who all work together to provide you with the care you need, when you need it.  We recommend signing up for the patient portal called "MyChart".  Sign up information is provided on this After Visit Summary.  MyChart is used to connect with patients for Virtual Visits (Telemedicine).  Patients are able to view lab/test results, encounter notes, upcoming appointments, etc.  Non-urgent messages can be sent to your provider as well.   To learn more about what you can do with MyChart, go to [email or web address].    Your next appointment:   3 month(s)  Provider:   Little Ishikawa, MD     Other Instructions     Signed, Little Ishikawa, MD  06/18/2023 8:49 AM    [facility]

## 2023-06-19 LAB — URINALYSIS
Bilirubin, UA: NEGATIVE
Nitrite, UA: NEGATIVE
Specific Gravity, UA: 1.016 (ref 1.005–1.030)
Urobilinogen, Ur: 0.2 mg/dL (ref 0.2–1.0)

## 2023-06-19 LAB — BASIC METABOLIC PANEL

## 2023-06-20 LAB — BASIC METABOLIC PANEL
BUN/Creatinine Ratio: 18 (ref 10–24)
CO2: 27 mmol/L (ref 20–29)
Calcium: 9.8 mg/dL (ref 8.6–10.2)
Creatinine, Ser: 1.06 mg/dL (ref 0.76–1.27)
Glucose: 98 mg/dL (ref 70–99)
Potassium: 4.4 mmol/L (ref 3.5–5.2)
Sodium: 145 mmol/L — ABNORMAL HIGH (ref 134–144)
eGFR: 75 mL/min/{1.73_m2} (ref >59)

## 2023-06-20 LAB — URINALYSIS
Ketones, UA: NEGATIVE
pH, UA: 6 (ref 5.0–7.5)

## 2023-06-20 LAB — MAGNESIUM: Magnesium: 2.4 mg/dL — ABNORMAL HIGH (ref 1.6–2.3)

## 2023-06-21 ENCOUNTER — Other Ambulatory Visit: Payer: Self-pay

## 2023-06-21 MED ORDER — CIPROFLOXACIN HCL 500 MG PO TABS: ORAL_TABLET | ORAL | 0 refills | Status: DC

## 2023-06-23 LAB — CULTURE, URINE COMPREHENSIVE

## 2023-06-28 ENCOUNTER — Encounter: Payer: Self-pay | Admitting: Cardiology

## 2023-07-01 NOTE — Telephone Encounter (Signed)
Spoke to patient's wife Dr.Schumann is out of office this week.Patient assistance paper work is in his folder to sign.We will fax to patient assistance next week.

## 2023-07-02 ENCOUNTER — Telehealth: Payer: Self-pay | Admitting: Cardiology

## 2023-07-02 NOTE — Telephone Encounter (Signed)
   Pre-operative Risk Assessment    Patient Name: Dustin Blanchard  DOB: 11-21-1951 MRN: [id number]     Request for Surgical Clearance    Procedure:   SRP and possible surgical procedure to extract 4 teeth which will require opening a agential flap and bone grafting    Date of Surgery:  Clearance 07/20/23                                 Surgeon:  Prescott Gum DDS  Surgeon's Group or Practice Name:  [facility] Phone number:  [phone] Fax number:  [phone]   Type of Clearance Requested:   - Pharmacy:  Hold TBD  by cardiology   Type of Anesthesia:  Local    Additional requests/questions:   States more than likely 07/30 will just be SRP  Signed, Brooks Sailors   07/02/2023, 1:54 PM

## 2023-07-04 NOTE — Progress Notes (Signed)
Electrophysiology Office Note:   Date:  07/05/2023  ID:  Dustin Blanchard, DOB 08/13/1951, MRN [id number]  Primary Cardiologist: Little Ishikawa, MD Electrophysiologist: Sherryl Manges, MD      History of Present Illness:   Dustin Blanchard is a 72 y.o. male with h/o L MCA CVA with residual aphasia (07/2022, new AF found), dementia, systolic + diastolic CHF (EF 00-86%), NICM, sinus pause & AFL on Zio patch 12/2021, AF, seen 07/05/23 for routine electrophysiology followup.   He was hospitalized from 5/30 - 05/24/23 for acutely decompensated systolic and diastolic CHF with LVEF 20-25%. He was actively diuresed. Noted to be orthostatic positive. He was discharged on 20 mg lasix.   Since last being seen in our clinic the patient reports doing well. His wife reports he is working with PT after his hospitalization. He frequently goes out into the yard to "work". 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  Weights have been stable.   He denies chest pain, palpitations, dyspnea, PND, orthopnea, nausea, vomiting, dizziness, syncope, edema, weight gain, or early satiety.   Review of systems complete and found to be negative unless listed in HPI.   EP information / Studies Reviewed:    EKG is not ordered today. EKG from 06/18/23 reviewed which showed atypical atrial flutter       Studies:  ECHO 05/21/23 > LVEF 20-25%, LV w/ severely decreased function / global hypokinesis, mild LVH of septal segment, RV systolic normal, moderately elevated pulmonary artery systolic pressure, LA severely dilated, small pericardial effusion w/o evidence of tamponade, large pleural effusion, mild MR, mild AR, grade II atheroma plaque involving the aortic root   Risk Assessment/Calculations:    CHA2DS2-VASc Score = 4   This indicates a 4.8% annual risk of stroke. The patient's score is based upon: CHF History: 1 HTN History: 0 Diabetes History: 0 Stroke History:  2 Vascular Disease History: 0 Age Score: 1 Gender Score: 0             Physical Exam:   VS:  BP (!) 90/58   Pulse 82   Ht 6\' 2"  (1.88 m)   Wt 145 lb (65.8 kg)   SpO2 96%   BMI 18.62 kg/m    Wt Readings from Last 3 Encounters:  07/05/23 145 lb (65.8 kg)  06/18/23 139 lb 12.8 oz (63.4 kg)  06/09/23 138 lb 12.8 oz (63 kg)     GEN: thin adult male, well developed in no acute distress NECK: No JVD; No carotid bruits CARDIAC: Irregularly irregular rate and rhythm, no murmurs, rubs, gallops RESPIRATORY:  Clear to auscultation without rales, wheezing or rhonchi  ABDOMEN: Soft, non-tender, non-distended EXTREMITIES:  No edema; No deformity   ASSESSMENT AND PLAN:    Persistent Atrial Fibrillation / Atypical Atrial Flutter  CHA2DS2Vasc 4 -amiodarone discontinued in 01/2023 due to bradycardia  -evaluated by Dr. Graciela Husbands in 03/2023 for pacing, felt not to warrant pacing at that time due to persistent arrhythmia (prior post termination pauses) -continue eliquis   Secondary Hypercoagulable State -continue eliquis, dose reviewed & appropriate   Sinus Pause -noted on prior Zio  Chronic Combined Systolic & Diastolic CHF  NICM (presumed) GMDT limited by soft blood pressures.  -per Cardiology  -recent concerns for possible UTI but UA negative, Jardiance stopped  -note recent suggestion of referral to palliative care, felt to have poor prognosis with severe heart failure   L MCA CVA  Dementia  -historical note, presented 07/2022 with AF and L MCA CVA  Follow up with Dr. Graciela Husbands in 6 months  Signed, Canary Brim, MSN, APRN, NP-C, AGACNP-BC [facility] - Electrophysiology  07/05/2023, 11:36 AM

## 2023-07-05 ENCOUNTER — Encounter: Payer: Self-pay | Admitting: Student

## 2023-07-05 ENCOUNTER — Ambulatory Visit: Payer: Medicare HMO | Attending: Student | Admitting: Pulmonary Disease

## 2023-07-05 VITALS — BP 90/58 | HR 82 | Ht 74.0 in | Wt 145.0 lb

## 2023-07-05 DIAGNOSIS — I4891 Unspecified atrial fibrillation: Secondary | ICD-10-CM

## 2023-07-05 DIAGNOSIS — I63412 Cerebral infarction due to embolism of left middle cerebral artery: Secondary | ICD-10-CM

## 2023-07-05 DIAGNOSIS — I5042 Chronic combined systolic (congestive) and diastolic (congestive) heart failure: Secondary | ICD-10-CM | POA: Diagnosis not present

## 2023-07-05 DIAGNOSIS — F0390 Unspecified dementia without behavioral disturbance: Secondary | ICD-10-CM

## 2023-07-05 NOTE — Patient Instructions (Signed)
Medication Instructions:  Your physician recommends that you continue on your current medications as directed. Please refer to the Current Medication list given to you today.  *If you need a refill on your cardiac medications before your next appointment, please call your pharmacy*  Lab Work: None ordered If you have labs (blood work) drawn today and your tests are completely normal, you will receive your results only by: MyChart Message (if you have MyChart) OR A paper copy in the mail If you have any lab test that is abnormal or we need to change your treatment, we will call you to review the results.  Follow-Up: At [facility], you and your health needs are our priority.  As part of our continuing mission to provide you with exceptional heart care, we have created designated Provider Care Teams.  These Care Teams include your primary Cardiologist (physician) and Advanced Practice Providers (APPs -  Physician Assistants and Nurse Practitioners) who all work together to provide you with the care you need, when you need it.  Your next appointment:   6 month(s)  Provider:   Sherryl Manges, MD

## 2023-07-05 NOTE — Telephone Encounter (Signed)
Patient with diagnosis of afib on Eliquis for anticoagulation.    Procedure: SRP (deep cleaning), possible 4 dental extractions Date of procedure: 07/20/23  CHA2DS2-VASc Score = 4  This indicates a 4.8% annual risk of stroke. The patient's score is based upon: CHF History: 1 HTN History: 0 Diabetes History: 0 Stroke History: 2 Vascular Disease History: 0 Age Score: 1 Gender Score: 0   Stroke 07/2022, new onset afib dx at that time.  CrCl 19mL/min Platelet count 202K  Patient does not require pre-op antibiotics for dental procedure.  He does not need to hold his Eliquis if he's just having a deep cleaning. If he is going to have multiple extractions as well, recommend holding Eliquis for 1 day prior. Sees EP today at 11am, would confirm they aren't planning any cardioversions/ablations for patient before faxing over clearance as this would change our recommendation.  **This guidance is not considered finalized until pre-operative APP has relayed final recommendations.**

## 2023-07-05 NOTE — Telephone Encounter (Signed)
     Primary Cardiologist: Little Ishikawa, MD  Chart reviewed as part of pre-operative protocol coverage. Given past medical history and time since last visit, based on ACC/AHA guidelines, Koda Routon would be at acceptable risk for the planned procedure without further cardiovascular testing.   Patient with diagnosis of afib on Eliquis for anticoagulation.     Procedure: SRP (deep cleaning), possible 4 dental extractions Date of procedure: 07/20/23   CHA2DS2-VASc Score = 4  This indicates a 4.8% annual risk of stroke. The patient's score is based upon: CHF History: 1 HTN History: 0 Diabetes History: 0 Stroke History: 2 Vascular Disease History: 0 Age Score: 1 Gender Score: 0   Stroke 07/2022, new onset afib dx at that time.   CrCl 57mL/min Platelet count 202K   Patient does not require pre-op antibiotics for dental procedure.   He does not need to hold his Eliquis if he's just having a deep cleaning. If he is going to have multiple extractions as well, recommend holding Eliquis for 1 day prior. Sees EP today at 11am, would confirm they aren't planning any cardioversions/ablations for patient before faxing over clearance as this would change our recommendation.   I will route this recommendation to the requesting party via Epic fax function and remove from pre-op pool.  Please call with questions.  Thomasene Ripple. Jaiven Graveline NP-C     07/05/2023, 11:42 AM [facility] [address] Office [phone] Fax [phone]

## 2023-07-27 ENCOUNTER — Encounter: Payer: Self-pay | Admitting: Adult Health

## 2023-07-27 ENCOUNTER — Ambulatory Visit: Payer: Medicare HMO | Admitting: Adult Health

## 2023-07-27 VITALS — BP 102/69 | HR 98 | Ht 72.0 in | Wt 140.0 lb

## 2023-07-27 DIAGNOSIS — Z5181 Encounter for therapeutic drug level monitoring: Secondary | ICD-10-CM | POA: Diagnosis not present

## 2023-07-27 DIAGNOSIS — I63512 Cerebral infarction due to unspecified occlusion or stenosis of left middle cerebral artery: Secondary | ICD-10-CM

## 2023-07-27 MED ORDER — DIVALPROEX SODIUM 125 MG PO CSDR: 250.0000 mg | DELAYED_RELEASE_CAPSULE | Freq: Every evening | ORAL | 11 refills | Status: DC

## 2023-07-27 NOTE — Patient Instructions (Addendum)
Continue valproic acid - will switch to Depakote sprinkles - you will use 2 capsules nightly Will check valproic acid level today   Continue use of Seroquel as needed  Can consider use of Namenda in the future if memory starts to become progressive   Continue  Eliquis   and atorvastatin for secondary stroke prevention managed by PCP/cardiology  Continue to follow up with PCP regarding blood pressure and cholesterol management  Maintain strict control of hypertension with blood pressure goal below 130/90 and cholesterol with LDL cholesterol (bad cholesterol) goal below 70 mg/dL.   Signs of a Stroke? Follow the BEFAST method:  Balance Watch for a sudden loss of balance, trouble with coordination or vertigo Eyes Is there a sudden loss of vision in one or both eyes? Or double vision?  Face: Ask the person to smile. Does one side of the face droop or is it numb?  Arms: Ask the person to raise both arms. Does one arm drift downward? Is there weakness or numbness of a leg? Speech: Ask the person to repeat a simple phrase. Does the speech sound slurred/strange? Is the person confused ? Time: If you observe any of these signs, call 911.     Followup in the future with me in 6 months or call earlier if needed      Thank you for coming to see [place] at [facility]. I hope we have been able to provide you high quality care today.  You may receive a patient satisfaction survey over the next few weeks. We would appreciate your feedback and comments so that we may continue to improve ourselves and the health of our patients.

## 2023-07-27 NOTE — Progress Notes (Signed)
[facility] [address] [place]. [place] [address] [phone]       STROKE FOLLOW UP NOTE  [name] Date of Birth:  [birth year] Medical Record Number:  [id number]    Primary neurologist: Dr. Pearlean Brownie Reason for Referral: stroke follow up    SUBJECTIVE:   CHIEF COMPLAINT:  Chief Complaint  Patient presents with   Follow-up    Rm 3. Accompanied by wife, Dustin Blanchard. Patient's wife has seen some improvement. She feels like improvement is intermittent. She would like to know his prognosis. He continues to struggle with speech. BP was 92/65 this morning.    HPI:   Update 07/27/2023 JM: Patient returns for follow-up visit accompanied by his wife.  Has been stable without new stroke/TIA symptoms.  Residual speech impairment, dysphagia, right-sided weakness, cognitive impairment and visual impairment stable. Ambulates without AD, denies any recent falls. Current diet dys 3 and thin liquids - tolerating well.  Continues on Depakene 250mg  liquid nightly, Seroquel as needed, can have occasional sundowning per wife, will improve after Seroquel dosage.  She questions trialing Depakote sprinkles as she can have difficulty administering liquid form at times.  Denies any other significant behavioral concerns.  Compliant on Eliquis and atorvastatin.  Routinely follows with PCP and cardiology for stroke risk factor management.  Of note, was hospitalized for 4 days back in May with decompensated heart failure and improved after IV Lasix, routinely followed by cardiology.    History provided for reference purposes only Update 01/26/2023 JM: Patient returns for 79-month stroke follow-up accompanied by his wife who provides history.  Reports residual speech impairment, dysphagia, right-sided weakness and visual impairment.  She does believe he has made some progress since prior visit.  Improvement of right-sided strength, now ambulates without assistive device, denies any recent  falls.  Currently doing well on Dys 3 thin liquid diet. Speech still significantly limited but has been having some spontaneous speech more recently.  Wife tries to encourage him to do HEP routinely at home. Completed SLP back in December as he met maximize rehab potential. Completed PT/OT back in November with goals partially met and meeting maximize rehab potential. Continues on Seroquel and Depakene for mood stabilization which has been stable. Denies new stroke/TIA symptoms.   Compliant on Eliquis and atorvastatin Blood pressure well-controlled Routinely follows with PCP and cardiology  Initial visit 09/17/2022 JM: Patient being seen for hospital follow up accompanied by his wife who provides majority of history.  Reports continued right-sided weakness, aphasia, dysphagia and right-sided visual impairment.  Just recently started OT and SLP and recent initial PT evaluation, has follow-up visits today. Wife does note improvement since discharge.  Ambulating with RW, was not using before, denies any recent falls. Remains on pured diet with NTL. No new stroke/TIA symptoms. Continues on Seroquel and Depakene for mood stabilization, does have some agitation in the evening but does improve after medications. Sleeping well.  Appetite satisfactory, currently on mirtazapine (started during CIR admission).   Compliant on Eliquis and atorvastatin, denies side effects Blood pressure today 116/62, does not routinely monitor at home Has since been seen by PCP and cardiology, PMR f/u visit next week  Stroke admission 08/06/2022 Scott Chessman is a 72 y.o. male with a past medical history significant for hypertension, hyperlipidemia, sinus bradycardia, BPH who presented to outside hospital after being found down at home on 08/06/2022.  He was found to have an acute left MCA stroke.  He was given TNK and transferred to [facility] for  mechanical thrombectomy of left M2 occlusion, which was successful.  Punctate right ACA  infarct was also seen on MRI.  Etiology likely due to newly diagnosed A-fib and was stared on Eliquis.  EF 35 to 40% with severe LA dilation.  LDL 71 -increase atorvastatin from 20 mg to 40 mg daily.  A1c 4.6.  Other stroke risk factors include advanced age and CHF.  No prior stroke history.  Hospital course complicated by cognitive impairment at baseline with delirium/sundowning, was placed on Seroquel and Depakote and advised to taper off meds once able.  He was transferred to [facility] for ongoing therapy needs.     PERTINENT IMAGING  CT showed no acute abnormality, old left occipital pole infarct.  Status post TNK.   CTA head and neck showed left M3 occlusion, bilateral siphon atherosclerosis with left moderate stenosis, left V4 stenosis.   CTP 0/123.   Status post IR with left M3 TICI2c reperfusion.   Repeat CT showed left frontal infarct without significant hemorrhage.   MRI showed left MCA patchy infarct with mild hemorrhagic conversion and a punctate right ACA infarct.   EF 35 to 40%. Severe LA dilation, LDL 71 A1c 4.6  UDS negative.     ROS:   N/A d/t language impairment  PMH:  Past Medical History:  Diagnosis Date   Aphasia as late effect of stroke    Atrial flutter (HCC)    Cardiomyopathy, nonischemic (HCC)    Dementia (HCC)    Stroke (HCC)     PSH:  Past Surgical History:  Procedure Laterality Date   HERNIA REPAIR     HIP SURGERY     IR CT HEAD LTD  08/06/2022   IR CT HEAD LTD  08/06/2022   IR PERCUTANEOUS ART THROMBECTOMY/INFUSION INTRACRANIAL INC DIAG ANGIO  08/06/2022   IR US GUIDE VASC ACCESS RIGHT  08/06/2022   RADIOLOGY WITH ANESTHESIA N/A 08/06/2022   Procedure: IR WITH ANESTHESIA;  Surgeon: Julieanne Cotton, MD;  Location: [facility] OR;  Service: Radiology;  Laterality: N/A;    Social History:  Social History   Socioeconomic History   Marital status: Married    Spouse name: Dustin Blanchard   Number of children: 3   Years of education: Not on file   Highest education  level: Not on file  Occupational History   Occupation: Retired  Tobacco Use   Smoking status: Former   Smokeless tobacco: Never  Advertising account planner   Vaping status: Never Used  Substance and Sexual Activity   Alcohol use: No   Drug use: Not Currently   Sexual activity: Never  Other Topics Concern   Not on file  Social History Narrative   Not on file   Social Determinants of Health   Financial Resource Strain: Low Risk  (05/21/2023)   Overall Financial Resource Strain (CARDIA)    Difficulty of Paying Living Expenses: Not very hard  Food Insecurity: Patient Unable To Answer (05/21/2023)   Hunger Vital Sign    Worried About Running Out of Food in the Last Year: Patient unable to answer    Ran Out of Food in the Last Year: Patient unable to answer  Transportation Needs: Patient Unable To Answer (05/21/2023)   PRAPARE - Administrator, Civil Service (Medical): Patient unable to answer    Lack of Transportation (Non-Medical): Patient unable to answer  Physical Activity: Not on file  Stress: Not on file  Social Connections: Not on file  Intimate Partner Violence: Patient Unable To Answer (  05/21/2023)   Humiliation, Afraid, Rape, and Kick questionnaire    Fear of Current or Ex-Partner: Patient unable to answer    Emotionally Abused: Patient unable to answer    Physically Abused: Patient unable to answer    Sexually Abused: Patient unable to answer    Family History: History reviewed. No pertinent family history.  Medications:   Current Outpatient Medications on File Prior to Visit  Medication Sig Dispense Refill   acetaminophen (TYLENOL) 325 MG tablet Take 2 tablets (650 mg total) by mouth every 4 (four) hours as needed for mild pain (or temp > 37.5 C (99.5 F)).     apixaban (ELIQUIS) 5 MG TABS tablet TAKE 1 TABLET BY MOUTH TWICE A DAY 60 tablet 5   atorvastatin (LIPITOR) 20 MG tablet Take 20 mg by mouth daily.     B Complex Vitamins (VITAMIN B COMPLEX PO)       Cholecalciferol (VITAMIN D-3) 125 MCG (5000 UT) TABS Take 1 tablet by mouth daily.     furosemide (LASIX) 20 MG tablet Take 1 tablet (20 mg total) by mouth daily. Take additional 1 tablet ( 20 mg) for weight gain of 3 lbs in 1 day or 5 lbs in 1 week. 30 tablet 0   melatonin 3 MG TABS tablet Take 1 tablet (3 mg total) by mouth at bedtime. 30 tablet 0   mirtazapine (REMERON) 7.5 MG tablet Take 1 tablet (7.5 mg total) by mouth at bedtime. 90 tablet 3   polyethylene glycol (MIRALAX / GLYCOLAX) 17 g packet Take 17 g by mouth daily. (Patient taking differently: Take 17 g by mouth daily as needed.) 14 each 0   QUEtiapine (SEROQUEL) 25 MG tablet Take 25 mg by mouth daily as needed.     RA ACETAMINOPHEN EX ST 500 MG tablet Take 1 tablet by mouth every six hours as needed     tamsulosin (FLOMAX) 0.4 MG CAPS capsule Take 1 capsule (0.4 mg total) by mouth daily. 30 capsule 0   valproic acid (DEPAKENE) 250 MG/5ML solution TAKE 5 MLS (250 MG TOTAL) BY MOUTH AT BEDTIME. 900 mL 1   No current facility-administered medications on file prior to visit.    Allergies:  No Known Allergies    OBJECTIVE:  Physical Exam  Vitals:   07/27/23 1427  BP: 102/69  Pulse: 98  Weight: 140 lb (63.5 kg)  Height: 6' (1.829 m)   Body mass index is 18.99 kg/m. No results found.   General: well developed, well nourished, very pleasant elderly African-American male, seated, in no evident distress Head: head normocephalic and atraumatic.   Neck: supple with no carotid or supraclavicular bruits Musculoskeletal: no deformity Skin:  no rash/petichiae Vascular:  Normal pulses all extremities   Neurologic Exam Mental Status: Awake and fully alert.  Severe expressive, no intelligible speech. Unable to appreciate receptive aphasia during visit.  Able to follow commands without difficulty. Able to shake head yes/no appropriately.  Unable to fully assess orientation.  Mood and affect appropriate.  Cranial Nerves: Pupils  equal, briskly reactive to light. Extraocular movements full without nystagmus. Visual fields right homonymous hemianopsia.Marland Kitchen Hearing intact. Facial sensation intact.  Right lower facial weakness.  Tongue, palate moves normally and symmetrically.  Motor: Normal bulk and tone. Normal strength in all tested extremity muscles except 4+/5 RUE weakness and mild right HF weakness Sensory.: intact to touch , pinprick , position and vibratory sensation.  Coordination: Rapid alternating movements normal in all extremities except decreased right hand.  Gait and Station: Arises from chair with mild difficulty. Stance is slightly hunched. Gait demonstrates slightly decreased stride length of RLE and mild unsteadiness without use of AD.  Tandem walking heel toe not attempted.   Reflexes: 1+ and symmetric. Toes downgoing.        ASSESSMENT: Aarish Forgette is a 72 y.o. year old male left MCA patchy infarct and punctate right ACA infarct due to left M3 occlusion on 08/06/2022 s/p TNK and IR with TICI 2C reperfusion, embolic source likely due to newly diagnosed A-fib. Vascular risk factors include HTN, HLD, new dx of A fib, CHF, tachybradycardia syndrome, cognitive impairment at baseline and CHF.     PLAN:  Embolic stroke:  Residual deficit: Mild right-sided weakness, severe expressive aphasia, right sided visual loss, cognitive impairment and dysphagia.  Stable since prior visit.  Previously completed therapies as met max rehab potential.  Continue HEP.  Will switch Depakene liquid to Depakote sprinkles.  Check valproic acid level today.  Recent hepatic function panel WNL.  Continue Seroquel as needed. If behaviors progress, would recommend trying to increase Depakote over restarting Seroquel daily usage.  Could consider use of Namenda if noticeable cognitive decline Continue Eliquis 5mg  twice daily and atorvastatin (Lipitor) 40 mg daily for secondary stroke prevention managed by PCP/cardiology.   Continue  routine follow-up with cardiology for Eliquis and A-fib management Discussed secondary stroke prevention measures and importance of close PCP follow up for aggressive stroke risk factor management including BP goal<130/90, HLD with LDL goal<70 and DM with A1c.<7 .  I have gone over the pathophysiology of stroke, warning signs and symptoms, risk factors and their management in some detail with instructions to go to the closest emergency room for symptoms of concern      Follow up in 6 months or call earlier if needed   CC:  PCP: Diana Eves, MD    I spent 35 minutes of face-to-face and non-face-to-face time with patient and wife.  This included previsit chart review, lab review, study review, order entry, electronic health record documentation, patient and wife education and discussion regarding above diagnoses and treatment plan and answered all questions to patient and wife satisfaction   Ihor Austin, AGNP-BC  [facility] [address] [place], [place] [address]  Phone [phone] Fax [phone] Note: This document was prepared with digital dictation and possible smart phrase technology. Any transcriptional errors that result from this process are unintentional.

## 2023-08-24 NOTE — Progress Notes (Signed)
Cardiology Office Note:    Date:  08/27/2023   ID:  Dustin Blanchard, DOB [birth year], MRN [id number]  PCP:  Diana Eves, MD  Cardiologist:  Little Ishikawa, MD  Electrophysiologist:  Sherryl Manges, MD   Referring MD: Diana Eves, MD   Chief Complaint  Patient presents with   Congestive Heart Failure    History of Present Illness:    Dustin Blanchard is a 72 y.o. male with a hx of chronic combined systolic and diastolic heart failure, CVA, atrial fibrillation, dementia (SLUMs 6/30 in 01/23) who presents from follow-up.  He was admitted 07/2022 with CVA.  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  Cardiology was consulted for new onset atrial fibrillation with RVR and heart failure.  Echocardiogram showed EF 35-40%.  He was started on Cardizem drip initially and became bradycardic, had sinus pauses up to 4.7 seconds.  EP was consulted and recommended amiodarone to try and maintain sinus rhythm.  Echocardiogram 01/11/2023 showed EF 20 to 25%, moderate RV dysfunction, small pericardial effusion, mild to moderate MR, mild AI, mild dilatation of aortic root measuring 40 mm.  Zio patch x 3 days 01/2023 showed 100% atrial flutter burden, average rate 67 bpm; 17 pauses with longest lasting 4 seconds.  Echo 05/21/2023 showed EF 20 to 25%, normal RV function, moderate elevation in pulmonary pressures, severe left atrial enlargement, small pericardial effusion, large pleural effusion, mild mitral regurgitation, mild aortic regurgitation.  Since last clinic visit, his wife reports that things have been going well.  Does not report chest pain or dyspnea.  Has been monitoring daily weights, has been stable, has not needed to use Lasix.  Wt Readings from Last 3 Encounters:  08/27/23 141 lb 3.2 oz (64 kg)  07/27/23 140 lb (63.5 kg)  07/05/23 145 lb (65.8 kg)      BP Readings from Last 3 Encounters:  08/27/23 100/72  07/27/23 102/69  07/05/23 (!) 90/58    Past Medical History:   Diagnosis Date   Aphasia as late effect of stroke    Atrial flutter (HCC)    Cardiomyopathy, nonischemic (HCC)    Dementia (HCC)    Stroke [facility])     Past Surgical History:  Procedure Laterality Date   HERNIA REPAIR     HIP SURGERY     IR CT HEAD LTD  08/06/2022   IR CT HEAD LTD  08/06/2022   IR PERCUTANEOUS ART THROMBECTOMY/INFUSION INTRACRANIAL INC DIAG ANGIO  08/06/2022   IR US GUIDE VASC ACCESS RIGHT  08/06/2022   RADIOLOGY WITH ANESTHESIA N/A 08/06/2022   Procedure: IR WITH ANESTHESIA;  Surgeon: Julieanne Cotton, MD;  Location: [facility] OR;  Service: Radiology;  Laterality: N/A;    Current Medications: Current Meds  Medication Sig   acetaminophen (TYLENOL) 325 MG tablet Take 2 tablets (650 mg total) by mouth every 4 (four) hours as needed for mild pain (or temp > 37.5 C (99.5 F)).   atorvastatin (LIPITOR) 20 MG tablet Take 20 mg by mouth daily.   B Complex Vitamins (VITAMIN B COMPLEX PO)    Cholecalciferol (VITAMIN D-3) 125 MCG (5000 UT) TABS Take 1 tablet by mouth daily.   divalproex (DEPAKOTE SPRINKLES) 125 MG capsule Take 2 capsules (250 mg total) by mouth at bedtime.   ELIQUIS 5 MG TABS tablet TAKE 1 TABLET BY MOUTH TWICE A DAY   furosemide (LASIX) 20 MG tablet Take 1 tablet (20 mg total) by mouth daily. Take additional 1 tablet ( 20 mg) for weight gain  of 3 lbs in 1 day or 5 lbs in 1 week.   melatonin 3 MG TABS tablet Take 1 tablet (3 mg total) by mouth at bedtime.   mirtazapine (REMERON) 7.5 MG tablet Take 1 tablet (7.5 mg total) by mouth at bedtime.   polyethylene glycol (MIRALAX / GLYCOLAX) 17 g packet Take 17 g by mouth daily. (Patient taking differently: Take 17 g by mouth daily as needed.)   QUEtiapine (SEROQUEL) 25 MG tablet Take 25 mg by mouth daily as needed.   RA ACETAMINOPHEN EX ST 500 MG tablet Take 1 tablet by mouth every six hours as needed   tamsulosin (FLOMAX) 0.4 MG CAPS capsule Take 1 capsule (0.4 mg total) by mouth daily.     Allergies:   Patient has no  known allergies.   Social History   Socioeconomic History   Marital status: Married    Spouse name: Rinaldo Cloud   Number of children: 3   Years of education: Not on file   Highest education level: Not on file  Occupational History   Occupation: Retired  Tobacco Use   Smoking status: Former   Smokeless tobacco: Never  Advertising account planner   Vaping status: Never Used  Substance and Sexual Activity   Alcohol use: No   Drug use: Not Currently   Sexual activity: Never  Other Topics Concern   Not on file  Social History Narrative   Not on file   Social Determinants of Health   Financial Resource Strain: Low Risk  (05/21/2023)   Overall Financial Resource Strain (CARDIA)    Difficulty of Paying Living Expenses: Not very hard  Food Insecurity: Patient Unable To Answer (05/21/2023)   Hunger Vital Sign    Worried About Running Out of Food in the Last Year: Patient unable to answer    Ran Out of Food in the Last Year: Patient unable to answer  Transportation Needs: Patient Unable To Answer (05/21/2023)   PRAPARE - Administrator, Civil Service (Medical): Patient unable to answer    Lack of Transportation (Non-Medical): Patient unable to answer  Physical Activity: Not on file  Stress: Not on file  Social Connections: Not on file     Family History: The patient's family history is not on file.  ROS:   Please see the history of present illness.     All other systems reviewed and are negative.  EKGs/Labs/Other Studies Reviewed:    The following studies were reviewed today:   EKG:   06/18/2023: Atrial flutter, rate 70, LVH, PVCs 01/25/2023: Atrial fibrillation, rate 43 02/01/23: Atrial flutter, rate 71  Recent Labs: 05/20/2023: TSH 2.003 05/24/2023: Hemoglobin 14.6; Platelets 202 06/09/2023: ALT 21; B Natriuretic Peptide 817.4 06/18/2023: BUN 19; Creatinine, Ser 1.06; Magnesium 2.4; Potassium 4.4; Sodium 145  Recent Lipid Panel    Component Value Date/Time   CHOL 130 03/02/2023  1443   TRIG 86 03/02/2023 1443   HDL 51 03/02/2023 1443   CHOLHDL 2.5 03/02/2023 1443   CHOLHDL 2.8 08/07/2022 0241   VLDL 12 08/07/2022 0241   LDLCALC 62 03/02/2023 1443    Physical Exam:    VS:  BP 100/72   Pulse 62   Ht 6' (1.829 m)   Wt 141 lb 3.2 oz (64 kg)   BMI 19.15 kg/m     Wt Readings from Last 3 Encounters:  08/27/23 141 lb 3.2 oz (64 kg)  07/27/23 140 lb (63.5 kg)  07/05/23 145 lb (65.8 kg)  GEN:   in no acute distress HEENT: Normal NECK: No JVD; No carotid bruits CARDIAC: Irregular, bradycardic no murmurs, rubs, gallops RESPIRATORY:  Clear to auscultation without rales, wheezing or rhonchi  ABDOMEN: Soft, non-tender, non-distended MUSCULOSKELETAL:  No edema; No deformity  SKIN: Warm and dry NEUROLOGIC:  Alert and oriented x 1.  Aphasia  PSYCHIATRIC:  Normal affect   ASSESSMENT:    1. Chronic combined systolic (congestive) and diastolic (congestive) heart failure (HCC)   2. Atrial fibrillation, unspecified type (HCC)   3. Hyperlipidemia, unspecified hyperlipidemia type   4. Mitral valve insufficiency, unspecified etiology      PLAN:    Chronic combined systolic and diastolic heart failure: Echocardiogram during admission 07/2022 showed EF 35 to 40%.  Echocardiogram 01/11/2023 showed EF 20 to 25%, moderate RV dysfunction, small pericardial effusion, mild to moderate MR, mild AI, mild dilatation of aortic root measuring 40 mm.  Echo 05/21/2023 showed EF 20 to 25%, normal RV function, moderate elevation in pulmonary pressures, severe left atrial enlargement, small pericardial effusion, large pleural effusion, mild mitral regurgitation, mild aortic regurgitation. -GDMT has been limited due to soft blood pressure.  Would hold off on beta-blocker given low resting heart rate.  Was on Jardiance but discontinued due to UTIs -Continue Lasix 20 mg daily.  Check BMET, magnesium -Not a good candidate for invasive evaluation due to his comorbidities including dementia,  he is oriented to person only.  Will plan medical management.  Unfortunately prognosis is poor given his severe heart failure.  Discussed referral to palliative care with his wife, she would like to think about it but declines referral at this time  Atrial fibrillation/flutter: Presented with A-fib with RVR and acute CVA 07/2022.  He was started on Cardizem drip initially and became bradycardic, had sinus pauses up to 4.7 seconds.  EP was consulted and recommended amiodarone to try and maintain sinus rhythm. -Continue Eliquis 5 mg twice daily -At clinic visit 01/25/2023, was back in A-fib and bradycardic to 40s.  Discontinued amiodarone.  Zio patch x 3 days 01/2023 showed heart presented to flutter burden, average rate 67 bpm; 17 pauses with longest lasting 4 seconds.  Referred to EP, did not recommend pacing at this time  Hyperlipidemia: On atorvastatin 20 mg daily.  LDL 62 on 03/02/2023 l  Mitral regurgitation: Moderate MR on echocardiogram.  Will monitor  Acute CVA: He was admitted 07/2022 with CVA.  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  Found to have atrial fibrillation as above -Continue Eliquis, statin  Dementia: Scored 6/30 on SLUMs in 2023.  Currently oriented to self only   RTC in 4 months    Medication Adjustments/Labs and Tests Ordered: Current medicines are reviewed at length with the patient today.  Concerns regarding medicines are outlined above.  No orders of the defined types were placed in this encounter.  No orders of the defined types were placed in this encounter.   Patient Instructions  Medication Instructions:  Continue same medications *If you need a refill on your cardiac medications before your next appointment, please call your pharmacy*   Lab Work: None ordered   Testing/Procedures: None ordered   Follow-Up: At [facility], you and your health needs are our priority.  As part of our continuing mission to provide you with  exceptional heart care, we have created designated Provider Care Teams.  These Care Teams include your primary Cardiologist (physician) and Advanced Practice Providers (APPs -  Physician Assistants and Nurse Practitioners) who  all work together to provide you with the care you need, when you need it.  We recommend signing up for the patient portal called "MyChart".  Sign up information is provided on this After Visit Summary.  MyChart is used to connect with patients for Virtual Visits (Telemedicine).  Patients are able to view lab/test results, encounter notes, upcoming appointments, etc.  Non-urgent messages can be sent to your provider as well.   To learn more about what you can do with MyChart, go to [email or web address].    Your next appointment:  4 months    Provider:  Dr.Lionell Matuszak     Signed, Little Ishikawa, MD  08/27/2023 10:52 AM    [facility]

## 2023-08-25 ENCOUNTER — Other Ambulatory Visit: Payer: Self-pay | Admitting: Cardiology

## 2023-08-25 DIAGNOSIS — I4891 Unspecified atrial fibrillation: Secondary | ICD-10-CM

## 2023-08-25 NOTE — Telephone Encounter (Signed)
Prescription refill request for Eliquis received. Indication: AF Last office visit: 07/05/23  Janeece Riggers NP Scr: 1.06 on 06/18/23  Epic Age: 72 Weight: 65.8kg  Based on above findings Eliquis 5mg  twice daily is the appropriate dose.  Refill approved.

## 2023-08-27 ENCOUNTER — Encounter: Payer: Self-pay | Admitting: Cardiology

## 2023-08-27 ENCOUNTER — Ambulatory Visit: Payer: Medicare HMO | Attending: Cardiology | Admitting: Cardiology

## 2023-08-27 VITALS — BP 100/72 | HR 62 | Ht 72.0 in | Wt 141.2 lb

## 2023-08-27 DIAGNOSIS — I4891 Unspecified atrial fibrillation: Secondary | ICD-10-CM

## 2023-08-27 DIAGNOSIS — I5042 Chronic combined systolic (congestive) and diastolic (congestive) heart failure: Secondary | ICD-10-CM

## 2023-08-27 DIAGNOSIS — I340 Nonrheumatic mitral (valve) insufficiency: Secondary | ICD-10-CM

## 2023-08-27 DIAGNOSIS — E785 Hyperlipidemia, unspecified: Secondary | ICD-10-CM | POA: Diagnosis not present

## 2023-08-27 NOTE — Patient Instructions (Signed)
Medication Instructions:  Continue same medications *If you need a refill on your cardiac medications before your next appointment, please call your pharmacy*   Lab Work: None ordered   Testing/Procedures: None ordered   Follow-Up: At [facility], you and your health needs are our priority.  As part of our continuing mission to provide you with exceptional heart care, we have created designated Provider Care Teams.  These Care Teams include your primary Cardiologist (physician) and Advanced Practice Providers (APPs -  Physician Assistants and Nurse Practitioners) who all work together to provide you with the care you need, when you need it.  We recommend signing up for the patient portal called "MyChart".  Sign up information is provided on this After Visit Summary.  MyChart is used to connect with patients for Virtual Visits (Telemedicine).  Patients are able to view lab/test results, encounter notes, upcoming appointments, etc.  Non-urgent messages can be sent to your provider as well.   To learn more about what you can do with MyChart, go to [email or web address].    Your next appointment:  4 months    Provider:  Dr.Schumann     

## 2023-09-06 ENCOUNTER — Other Ambulatory Visit: Payer: Self-pay

## 2023-09-06 DIAGNOSIS — I4891 Unspecified atrial fibrillation: Secondary | ICD-10-CM

## 2023-09-06 MED ORDER — APIXABAN 5 MG PO TABS
5.0000 mg | ORAL_TABLET | Freq: Two times a day (BID) | ORAL | 1 refills | Status: AC
Start: 2023-09-06 — End: ?

## 2023-09-12 ENCOUNTER — Other Ambulatory Visit: Payer: Self-pay | Admitting: Physical Medicine and Rehabilitation

## 2023-10-01 ENCOUNTER — Telehealth: Payer: Self-pay | Admitting: Cardiology

## 2023-10-01 NOTE — Telephone Encounter (Signed)
Patient's wife states she thinks the patient has a UTI because his urine has a distinct smell. He's also fatigued and confused. She would like to know if Dr. Bjorn Pippin would be able to call in an antibiotic for him.

## 2023-10-01 NOTE — Telephone Encounter (Signed)
Called and spoke to patient's wife. Advised her he should be seen by his PAP. She stated they didn't have any appts today so she was advised to take pt to urgent care. She verbalized understanding and agree.

## 2023-11-25 ENCOUNTER — Other Ambulatory Visit: Payer: Self-pay

## 2023-11-25 ENCOUNTER — Emergency Department (HOSPITAL_COMMUNITY)
Admission: EM | Admit: 2023-11-25 | Discharge: 2023-11-26 | Disposition: A | Discharge status: Home / Self Care | Payer: Medicare HMO | Attending: Emergency Medicine | Admitting: Emergency Medicine

## 2023-11-25 ENCOUNTER — Encounter (HOSPITAL_COMMUNITY): Payer: Self-pay

## 2023-11-25 ENCOUNTER — Emergency Department (HOSPITAL_COMMUNITY): Payer: Medicare HMO

## 2023-11-25 DIAGNOSIS — Z7901 Long term (current) use of anticoagulants: Secondary | ICD-10-CM | POA: Insufficient documentation

## 2023-11-25 DIAGNOSIS — I509 Heart failure, unspecified: Secondary | ICD-10-CM | POA: Diagnosis not present

## 2023-11-25 DIAGNOSIS — F0390 Unspecified dementia without behavioral disturbance: Secondary | ICD-10-CM | POA: Insufficient documentation

## 2023-11-25 DIAGNOSIS — R0602 Shortness of breath: Secondary | ICD-10-CM | POA: Diagnosis present

## 2023-11-25 DIAGNOSIS — Z1152 Encounter for screening for COVID-19: Secondary | ICD-10-CM | POA: Insufficient documentation

## 2023-11-25 LAB — URINALYSIS, W/ REFLEX TO CULTURE (INFECTION SUSPECTED)
Bacteria, UA: NONE SEEN
Bilirubin Urine: NEGATIVE
Glucose, UA: NEGATIVE mg/dL
Hgb urine dipstick: NEGATIVE
Ketones, ur: NEGATIVE mg/dL
Leukocytes,Ua: NEGATIVE
Nitrite: NEGATIVE
Protein, ur: NEGATIVE mg/dL
Specific Gravity, Urine: 1.012 (ref 1.005–1.030)
pH: 6 (ref 5.0–8.0)

## 2023-11-25 LAB — CBC WITH DIFFERENTIAL/PLATELET
Abs Immature Granulocytes: 0.03 10*3/uL (ref 0.00–0.07)
Basophils Absolute: 0 10*3/uL (ref 0.0–0.1)
Basophils Relative: 0 %
Eosinophils Absolute: 0.1 10*3/uL (ref 0.0–0.5)
Eosinophils Relative: 1 %
HCT: 44.4 % (ref 39.0–52.0)
Hemoglobin: 13.8 g/dL (ref 13.0–17.0)
Immature Granulocytes: 0 %
Lymphocytes Relative: 20 %
Lymphs Abs: 1.5 10*3/uL (ref 0.7–4.0)
MCH: 31.7 pg (ref 26.0–34.0)
MCHC: 31.1 g/dL (ref 30.0–36.0)
MCV: 101.8 fL — ABNORMAL HIGH (ref 80.0–100.0)
Monocytes Absolute: 0.5 10*3/uL (ref 0.1–1.0)
Monocytes Relative: 7 %
Neutro Abs: 5.6 10*3/uL (ref 1.7–7.7)
Neutrophils Relative %: 72 %
Platelets: 232 10*3/uL (ref 150–400)
RBC: 4.36 MIL/uL (ref 4.22–5.81)
RDW: 13.9 % (ref 11.5–15.5)
WBC: 7.7 10*3/uL (ref 4.0–10.5)
nRBC: 0 % (ref 0.0–0.2)

## 2023-11-25 LAB — COMPREHENSIVE METABOLIC PANEL
ALT: 15 U/L (ref 0–44)
AST: 22 U/L (ref 15–41)
Albumin: 3.4 g/dL — ABNORMAL LOW (ref 3.5–5.0)
Alkaline Phosphatase: 75 U/L (ref 38–126)
Anion gap: 13 (ref 5–15)
BUN: 15 mg/dL (ref 8–23)
CO2: 22 mmol/L (ref 22–32)
Calcium: 9.6 mg/dL (ref 8.9–10.3)
Chloride: 110 mmol/L (ref 98–111)
Creatinine, Ser: 0.87 mg/dL (ref 0.61–1.24)
GFR, Estimated: >60 mL/min (ref >60)
Glucose, Bld: 93 mg/dL (ref 70–99)
Potassium: 4.1 mmol/L (ref 3.5–5.1)
Sodium: 145 mmol/L (ref 135–145)
Total Bilirubin: 1 mg/dL (ref <1.2)
Total Protein: 6.5 g/dL (ref 6.5–8.1)

## 2023-11-25 LAB — BRAIN NATRIURETIC PEPTIDE: B Natriuretic Peptide: 1241 pg/mL — ABNORMAL HIGH (ref 0.0–100.0)

## 2023-11-25 LAB — SARS CORONAVIRUS 2 BY RT PCR: SARS Coronavirus 2 by RT PCR: NEGATIVE

## 2023-11-25 MED ORDER — ALBUTEROL SULFATE HFA 108 (90 BASE) MCG/ACT IN AERS: 2.0000 | INHALATION_SPRAY | RESPIRATORY_TRACT | Status: DC | PRN

## 2023-11-25 MED ORDER — FUROSEMIDE 10 MG/ML IJ SOLN
20.0000 mg | Freq: Once | INTRAMUSCULAR | Status: AC
  Administered 2023-11-25: 20 mg via INTRAVENOUS
  Filled 2023-11-25: qty 2

## 2023-11-25 NOTE — ED Notes (Signed)
Pt maintained O2 100% while ambulating to the bathroom.

## 2023-11-25 NOTE — ED Provider Notes (Signed)
[place] [facility] AT [facility] Provider Note   CSN: [id number] Arrival date & time: 11/25/23  1317     History  Chief Complaint  Patient presents with   Shortness of Breath    [facility] Blanchard is a 72 y.o. male.  He has a history of dementia, level 5 caveat.  He is brought in by his wife who is giving most of the history.  She said for the last few days he seems a little more short of breath and she has heard some wheezing.  He has a history of congestive heart failure.  She said his weights have been pretty steady.  Also concerned he may have a UTI because she saw a little pink in his diaper.  Usually he is able to get himself to the bathroom but for the last week or so they have been using diapers for a little bit of leakage.  He is unable to give significant history but denies chest pain short of breath abdominal pain or dysuria.  Went to urgent care today and sats were 88% and so referred here for further evaluation.  The history is provided by the patient and the spouse.  Shortness of Breath Severity:  Moderate Onset quality:  Gradual Duration:  4 days Timing:  Intermittent Progression:  Unchanged Chronicity:  Recurrent Relieved by:  None tried Worsened by:  Activity Ineffective treatments:  None tried Associated symptoms: wheezing   Associated symptoms: no abdominal pain, no chest pain, no cough, no fever, no hemoptysis and no sputum production   Risk factors: no tobacco use        Home Medications Prior to Admission medications   Medication Sig Start Date End Date Taking? Authorizing Provider  mirtazapine (REMERON) 7.5 MG tablet TAKE 1 TABLET BY MOUTH AT BEDTIME. 09/13/23   Raulkar, Drema Pry, MD  acetaminophen (TYLENOL) 325 MG tablet Take 2 tablets (650 mg total) by mouth every 4 (four) hours as needed for mild pain (or temp > 37.5 C (99.5 F)). 08/25/22   Angiulli, Mcarthur Rossetti, PA-C  apixaban (ELIQUIS) 5 MG TABS tablet Take 1 tablet (5 mg total) by  mouth 2 (two) times daily. 09/06/23   Little Ishikawa, MD  atorvastatin (LIPITOR) 20 MG tablet Take 20 mg by mouth daily. 03/01/23   [provider]  B Complex Vitamins (VITAMIN B COMPLEX PO)  09/10/22   [provider]  Cholecalciferol (VITAMIN D-3) 125 MCG (5000 UT) TABS Take 1 tablet by mouth daily.    [provider]  divalproex (DEPAKOTE SPRINKLES) 125 MG capsule Take 2 capsules (250 mg total) by mouth at bedtime. 07/27/23   Ihor Austin, NP  furosemide (LASIX) 20 MG tablet Take 1 tablet (20 mg total) by mouth daily. Take additional 1 tablet ( 20 mg) for weight gain of 3 lbs in 1 day or 5 lbs in 1 week. 05/25/23   Rolly Salter, MD  melatonin 3 MG TABS tablet Take 1 tablet (3 mg total) by mouth at bedtime. 08/25/22   Angiulli, Mcarthur Rossetti, PA-C  polyethylene glycol (MIRALAX / GLYCOLAX) 17 g packet Take 17 g by mouth daily. Patient taking differently: Take 17 g by mouth daily as needed. 08/25/22   Angiulli, Mcarthur Rossetti, PA-C  QUEtiapine (SEROQUEL) 25 MG tablet Take 25 mg by mouth daily as needed.    [provider]  RA ACETAMINOPHEN EX ST 500 MG tablet Take 1 tablet by mouth every six hours as needed 09/10/22  [provider]  tamsulosin (FLOMAX) 0.4 MG CAPS capsule Take 1 capsule (0.4 mg total) by mouth daily. 08/25/22   Angiulli, Mcarthur Rossetti, PA-C      Allergies    Patient has no known allergies.    Review of Systems   Review of Systems  Unable to perform ROS: Dementia  Constitutional:  Negative for fever.  Respiratory:  Positive for shortness of breath and wheezing. Negative for cough, hemoptysis and sputum production.   Cardiovascular:  Negative for chest pain.  Gastrointestinal:  Negative for abdominal pain.    Physical Exam Updated Vital Signs BP 95/73 (BP Location: Right Arm)   Pulse 63   Temp 99.3 F (37.4 C) (Axillary)   Resp 18   Ht 6' (1.829 m)   Wt 62.4 kg   SpO2 100%   BMI 18.66 kg/m  Physical Exam Vitals and nursing note  reviewed.  Constitutional:      General: He is not in acute distress.    Appearance: He is well-developed.  HENT:     Head: Normocephalic and atraumatic.  Eyes:     Conjunctiva/sclera: Conjunctivae normal.  Cardiovascular:     Rate and Rhythm: Normal rate. Rhythm irregular.     Heart sounds: No murmur heard. Pulmonary:     Effort: Pulmonary effort is normal. No respiratory distress.     Breath sounds: Normal breath sounds.  Abdominal:     Palpations: Abdomen is soft.     Tenderness: There is no abdominal tenderness. There is no guarding or rebound.  Musculoskeletal:        General: No swelling.     Cervical back: Neck supple.     Right lower leg: No edema.     Left lower leg: No edema.  Skin:    General: Skin is warm and dry.     Capillary Refill: Capillary refill takes less than 2 seconds.  Neurological:     General: No focal deficit present.     Mental Status: He is alert.     ED Results / Procedures / Treatments   Labs (all labs ordered are listed, but only abnormal results are displayed) Labs Reviewed  CBC WITH DIFFERENTIAL/PLATELET - Abnormal; Notable for the following components:      Result Value   MCV 101.8 (*)    All other components within normal limits  COMPREHENSIVE METABOLIC PANEL - Abnormal; Notable for the following components:   Albumin 3.4 (*)    All other components within normal limits  SARS CORONAVIRUS 2 BY RT PCR  URINALYSIS, W/ REFLEX TO CULTURE (INFECTION SUSPECTED)  BRAIN NATRIURETIC PEPTIDE    EKG EKG Interpretation Date/Time:  Thursday November 25 2023 13:45:15 EST Ventricular Rate:  89 PR Interval:    QRS Duration:  104 QT Interval:  356 QTC Calculation: 433 R Axis:   85  Text Interpretation: Atrial flutter with variable A-V block with premature ventricular or aberrantly conducted complexes Abnormal ECG When compared with ECG of 18-Jun-2023 08:19, ST no longer elevated in Anterior leads T wave inversion no longer evident in Lateral  leads Confirmed by Meridee Score [phone]) on 11/25/2023 2:57:19 PM  Radiology DG Chest 2 View  Result Date: 11/25/2023 CLINICAL DATA:  sob EXAM: CHEST  2 VIEW PORTABLE COMPARISON:  Chest XR, 05/20/2019 and 08/13/2022. FINDINGS: Cardiac silhouette is enlarged. Pulmonary hypoinflation with perihilar interstitial thickening and opacities. No discrete consolidation or mass. No pleural effusion or pneumothorax. No acute osseous abnormality. IMPRESSION: Cardiomegaly and perihilar interstitial opacities. Findings suspicious  for early/developing pulmonary edema. Electronically Signed   By: Roanna Banning M.D.   On: 11/25/2023 15:30    Procedures Procedures    Medications Ordered in ED Medications  albuterol (VENTOLIN HFA) 108 (90 Base) MCG/ACT inhaler 2 puff (has no administration in time range)  furosemide (LASIX) injection 20 mg (has no administration in time range)    ED Course/ Medical Decision Making/ A&P Clinical Course as of 11/26/23 1002  Thu Nov 25, 2023  2016 Patient diuresed well with a dose of Lasix.  His infectious workup has been negative.  He had a trending pulse ox of 100% on room air.  Reviewed with wife and she is comfortable plan for discharge. [MB]    Clinical Course User Index [MB] Terrilee Files, MD                                 Medical Decision Making Amount and/or Complexity of Data Reviewed Labs: ordered. Radiology: ordered.  Risk Prescription drug management.   This patient complains of increased shortness of breath, possibly urinary tract infection; this involves an extensive number of treatment Options and is a complaint that carries with it a high risk of complications and morbidity. The differential includes UTI, CHF, COPD, pneumonia, COVID, flu  I ordered, reviewed and interpreted labs, which included CBC normal chemistries and LFTs normal urinalysis without signs of infection, BNP elevated, COVID-negative I ordered medication IV Lasix and reviewed  PMP when indicated. I ordered imaging studies which included chest x-ray and I independently    visualized and interpreted imaging which showed early fluid overload Additional history obtained from patient's wife Previous records obtained and reviewed in epic, patient last saw his cardiologist back in September Cardiac monitoring reviewed, sinus rhythm Social determinants considered, no significant barriers Critical Interventions: None  After the interventions stated above, I reevaluated the patient and found patient to not be symptomatic after diuresis Admission and further testing considered, no indications for admission at this time.  Wife comfortable with him going home and will follow-up with their treatment team.  Return instructions discussed         Final Clinical Impression(s) / ED Diagnoses Final diagnoses:  Acute on chronic congestive heart failure, unspecified heart failure type [facility])    Rx / DC Orders ED Discharge Orders     None         Terrilee Files, MD 11/26/23 1003

## 2023-11-25 NOTE — ED Triage Notes (Signed)
Wife stated that pt has been wheezing. Took to urgent care, pulse ox was 88%, told to come to ED  Pt has dementia Pt complains of SOB Denies cough  Wife stated that other caregiver seen blood in urine in pt's depend, wife has not seen this for herself, concerned for UTI.  Wife stated that he is a little slow and sleeping a lot

## 2023-11-25 NOTE — Discharge Instructions (Signed)
You were seen in the emergency department for some increased shortness of breath and also concerned about a urinary tract infection.  Your x-ray and your heart failure number were elevated.  You were given an extra dose of IV furosemide.  Your COVID test was negative.  Your urine did not show any sign of infection.  Please continue your regular medications and follow-up with your primary care doctor and heart specialist.  Return if any worsening or concerning symptoms

## 2023-12-26 NOTE — Progress Notes (Deleted)
 Cardiology Office Note:    Date:  12/26/2023   ID:  D[place] Blanchard, DOB [birth year], MRN [id number]  PCP:  Nola Hong, MD  Cardiologist:  Lonni LITTIE Nanas, MD  Electrophysiologist:  Elspeth Sage, MD   Referring MD: Nola Hong, MD   No chief complaint on file.   History of Present Illness:    D[place]in Blanchard is a 73 y.o. male with a hx of chronic combined systolic and diastolic heart failure, CVA, atrial fibrillation, dementia (SLUMs 6/30 in 01/23) who presents from follow-up.  He was admitted 07/2022 with CVA.  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  Cardiology was consulted for new onset atrial fibrillation with RVR and heart failure.  Echocardiogram showed EF 35-40%.  He was started on Cardizem  drip initially and became bradycardic, had sinus pauses up to 4.7 seconds.  EP was consulted and recommended amiodarone  to try and maintain sinus rhythm.  Echocardiogram 01/11/2023 showed EF 20 to 25%, moderate RV dysfunction, small pericardial effusion, mild to moderate MR, mild AI, mild dilatation of aortic root measuring 40 mm.  Zio patch x 3 days 01/2023 showed 100% atrial flutter burden, average rate 67 bpm; 17 pauses with longest lasting 4 seconds.  Echo 05/21/2023 showed EF 20 to 25%, normal RV function, moderate elevation in pulmonary pressures, severe left atrial enlargement, small pericardial effusion, large pleural effusion, mild mitral regurgitation, mild aortic regurgitation.  Since last clinic visit,  his wife reports that things have been going well.  Does not report chest pain or dyspnea.  Has been monitoring daily weights, has been stable, has not needed to use Lasix .  Wt Readings from Last 3 Encounters:  11/25/23 137 lb 9.6 oz (62.4 kg)  08/27/23 141 lb 3.2 oz (64 kg)  07/27/23 140 lb (63.5 kg)      BP Readings from Last 3 Encounters:  11/25/23 107/81  08/27/23 100/72  07/27/23 102/69    Past Medical History:  Diagnosis Date   Aphasia as  late effect of stroke    Atrial flutter (HCC)    Cardiomyopathy, nonischemic (HCC)    Dementia (HCC)    Stroke (HCC)     Past Surgical History:  Procedure Laterality Date   HERNIA REPAIR     HIP SURGERY     IR CT HEAD LTD  08/06/2022   IR CT HEAD LTD  08/06/2022   IR PERCUTANEOUS ART THROMBECTOMY/INFUSION INTRACRANIAL INC DIAG ANGIO  08/06/2022   IR US  GUIDE VASC ACCESS RIGHT  08/06/2022   RADIOLOGY WITH ANESTHESIA N/A 08/06/2022   Procedure: IR WITH ANESTHESIA;  Surgeon: Dolphus Carrion, MD;  Location: [facility] OR;  Service: Radiology;  Laterality: N/A;    Current Medications: No outpatient medications have been marked as taking for the 12/27/23 encounter (Appointment) with Nanas Lonni LITTIE, MD.     Allergies:   Patient has no known allergies.   Social History   Socioeconomic History   Marital status: Married    Spouse name: Sharlet   Number of children: 3   Years of education: Not on file   Highest education level: Not on file  Occupational History   Occupation: Retired  Tobacco Use   Smoking status: Former   Smokeless tobacco: Never  Advertising Account Planner   Vaping status: Never Used  Substance and Sexual Activity   Alcohol use: No   Drug use: Not Currently   Sexual activity: Never  Other Topics Concern   Not on file  Social History Narrative   Not on file  Social Drivers of Corporate Investment Banker Strain: Low Risk  (12/03/2023)   Received from [facility]   Overall Financial Resource Strain (CARDIA)    Difficulty of Paying Living Expenses: Not very hard  Food Insecurity: No Food Insecurity (12/03/2023)   Received from [facility]   Hunger Vital Sign    Worried About Running Out of Food in the Last Year: Never true    Ran Out of Food in the Last Year: Never true  Transportation Needs: No Transportation Needs (12/03/2023)   Received [name] - [facility]    In the past 12 months, has lack  of transportation kept you from medical appointments or from getting medications?: No    Lack of Transportation (Non-Medical): No  Physical Activity: Not on file  Stress: Not on file  Social Connections: Not on file     Family History: The patient's family history is not on file.  ROS:   Please see the history of present illness.     All other systems reviewed and are negative.  EKGs/Labs/Other Studies Reviewed:    The following studies were reviewed today:   EKG:   06/18/2023: Atrial flutter, rate 70, LVH, PVCs 01/25/2023: Atrial fibrillation, rate 43 02/01/23: Atrial flutter, rate 71  Recent Labs: 05/20/2023: TSH 2.003 06/18/2023: Magnesium  2.4 11/25/2023: ALT 15; B Natriuretic Peptide 1,241.0; BUN 15; Creatinine, Ser 0.87; Hemoglobin 13.8; Platelets 232; Potassium 4.1; Sodium 145  Recent Lipid Panel    Component Value Date/Time   CHOL 130 03/02/2023 1443   TRIG 86 03/02/2023 1443   HDL 51 03/02/2023 1443   CHOLHDL 2.5 03/02/2023 1443   CHOLHDL 2.8 08/07/2022 0241   VLDL 12 08/07/2022 0241   LDLCALC 62 03/02/2023 1443    Physical Exam:    VS:  There were no vitals taken for this visit.    Wt Readings from Last 3 Encounters:  11/25/23 137 lb 9.6 oz (62.4 kg)  08/27/23 141 lb 3.2 oz (64 kg)  07/27/23 140 lb (63.5 kg)     GEN:   in no acute distress HEENT: Normal NECK: No JVD; No carotid bruits CARDIAC: Irregular, bradycardic no murmurs, rubs, gallops RESPIRATORY:  Clear to auscultation without rales, wheezing or rhonchi  ABDOMEN: Soft, non-tender, non-distended MUSCULOSKELETAL:  No edema; No deformity  SKIN: Warm and dry NEUROLOGIC:  Alert and oriented x 1.  Aphasia  PSYCHIATRIC:  Normal affect   ASSESSMENT:    No diagnosis found.    PLAN:    Chronic combined systolic and diastolic heart failure: Echocardiogram during admission 07/2022 showed EF 35 to 40%.  Echocardiogram 01/11/2023 showed EF 20 to 25%, moderate RV dysfunction, small pericardial effusion,  mild to moderate MR, mild AI, mild dilatation of aortic root measuring 40 mm.  Echo 05/21/2023 showed EF 20 to 25%, normal RV function, moderate elevation in pulmonary pressures, severe left atrial enlargement, small pericardial effusion, large pleural effusion, mild mitral regurgitation, mild aortic regurgitation. -GDMT has been limited due to soft blood pressure.  Would hold off on beta-blocker given low resting heart rate.  Was on Jardiance  but discontinued due to UTIs -Continue Lasix  20 mg daily.  Check BMET, magnesium  -Not a good candidate for invasive evaluation due to his comorbidities including dementia, he is oriented to person only.  Will plan medical management.  Unfortunately prognosis is poor given his severe heart failure.  Discussed referral to palliative care with his wife, she would like to  think about it but declines referral at this time  Atrial fibrillation/flutter: Presented with A-fib with RVR and acute CVA 07/2022.  He was started on Cardizem  drip initially and became bradycardic, had sinus pauses up to 4.7 seconds.  EP was consulted and recommended amiodarone  to try and maintain sinus rhythm. -Continue Eliquis  5 mg twice daily -At clinic visit 01/25/2023, was back in A-fib and bradycardic to 40s.  Discontinued amiodarone .  Zio patch x 3 days 01/2023 showed 100% aflutter burden, average rate 67 bpm; 17 pauses with longest lasting 4 seconds.  Referred to EP, did not recommend pacing at this time  Hyperlipidemia: On atorvastatin  20 mg daily.  LDL 62 on 03/02/2023   Mitral regurgitation: Moderate MR on echocardiogram.  Will monitor  Acute CVA: He was admitted 07/2022 with CVA.  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  Found to have atrial fibrillation as above -Continue Eliquis , statin  Dementia: Scored 6/30 on SLUMs in 2023.  Currently oriented to self only   RTC in 4 months***    Medication Adjustments/Labs and Tests Ordered: Current medicines are reviewed  at length with the patient today.  Concerns regarding medicines are outlined above.  No orders of the defined types were placed in this encounter.  No orders of the defined types were placed in this encounter.   There are no Patient Instructions on file for this visit.   Signed, Lonni LITTIE Nanas, MD  12/26/2023 1:25 PM    [facility]

## 2023-12-27 ENCOUNTER — Ambulatory Visit: Payer: Medicare HMO | Admitting: Cardiology

## 2024-01-03 NOTE — Progress Notes (Signed)
 Cardiology Clinic Note   Patient Name: Dustin Blanchard Date of Encounter: 01/05/2024  Primary Care Provider:  Conchita Deem, MD Primary Cardiologist:  Wendie Hamburg, MD  Patient Profile    Dustin Blanchard 73 year old male presents to the clinic today for follow-up evaluation of his chronic combined systolic and diastolic CHF and atrial fibrillation.  Past Medical History    Past Medical History:  Diagnosis Date   Aphasia as late effect of stroke    Atrial flutter (HCC)    Cardiomyopathy, nonischemic (HCC)    Dementia (HCC)    Stroke [facility])    Past Surgical History:  Procedure Laterality Date   HERNIA REPAIR     HIP SURGERY     IR CT HEAD LTD  08/06/2022   IR CT HEAD LTD  08/06/2022   IR PERCUTANEOUS ART THROMBECTOMY/INFUSION INTRACRANIAL INC DIAG ANGIO  08/06/2022   IR US  GUIDE VASC ACCESS RIGHT  08/06/2022   RADIOLOGY WITH ANESTHESIA N/A 08/06/2022   Procedure: IR WITH ANESTHESIA;  Surgeon: Luellen Sages, MD;  Location: [facility] OR;  Service: Radiology;  Laterality: N/A;    Allergies  No Known Allergies  History of Present Illness    Dustin Blanchard has a PMH of chronic systolic and diastolic CHF, atrial fibrillation, HLD, mitral valve insufficiency, dementia, CVA, and nonischemic cardiomyopathy.  Echocardiogram 1/24 showed an EF of 20-25%, moderate RV dysfunction, small pericardial effusion, mild-moderate MR, mild AI, mild dilation of the aortic root measuring 40 mm, echocardiogram 05/21/2023 showed an EF of 20-25%, normal RV function, moderate elevation in pulmonary pressures, severe left atrial enlargement, small pericardial effusion, large pleural effusion, mild mitral regurgitation, mild aortic regurgitation.  His GDMT was limited due to soft blood pressure.  Previously felt that holding off on beta-blocker was needed due to his low resting heart rate.  His Jardiance  was previously discontinued due to urinary tract infections.  He is not a good candidate for invasive  evaluation due to his comorbidities including dementia.  He is oriented to person only.  He was seen in follow-up by Dr. Alda Amas on 08/27/2023.  During that time his furosemide  was continued.  Discussion for referral to palliative care with wife was initiated.  She reported that she would like to think about next steps.  She declined referral.  He was noted to be back in atrial fibrillation and be bradycardic in the 40s on 01/25/2023.  His amiodarone  was discontinued.  He wore a cardiac event monitor which showed average heart rate of 67 bpm, 17 pauses with the longest being around 4 seconds.  He was referred to EP however they did not recommend pacing.  Dementia score was noted to be 6 out of 30 on slums in 2023.  He continues to be oriented to self.  Follow-up was planned for 4 months.  He presents to the clinic today for follow-up evaluation and states he was just admitted in [facility] with pneumonia and acute CHF exacerbation.  He was admitted for 5 days.  His wife requested admission to [facility] however, he was admitted to [facility].  We reviewed his hospitalization.  She expressed understanding.  He received IV diuresis and was noted to have a right apical nodule.  Repeat chest CT was recommended.  His wife reports that he will have follow-up with pulmonology this afternoon.  I will give a weight log, continue his current medication regimen and plan follow-up in 4 months.  Today he denies chest pain, shortness of breath, lower extremity  edema, fatigue, palpitations, melena, hematuria, hemoptysis, diaphoresis, weakness, presyncope, syncope, orthopnea, and PND.   Home Medications    Prior to Admission medications   Medication Sig Start Date End Date Taking? Authorizing Provider  mirtazapine  (REMERON ) 7.5 MG tablet TAKE 1 TABLET BY MOUTH AT BEDTIME. 09/13/23   Raulkar, Keven Pel, MD  acetaminophen  (TYLENOL ) 325 MG tablet Take 2 tablets (650 mg total) by mouth every 4 (four) hours as needed  for mild pain (or temp > 37.5 C (99.5 F)). 08/25/22   Angiulli, Everlyn Hockey, PA-C  apixaban  (ELIQUIS ) 5 MG TABS tablet Take 1 tablet (5 mg total) by mouth 2 (two) times daily. 09/06/23   Wendie Hamburg, MD  atorvastatin  (LIPITOR) 20 MG tablet Take 20 mg by mouth daily. 03/01/23   [provider]  B Complex Vitamins (VITAMIN B COMPLEX PO)  09/10/22   [provider]  Cholecalciferol  (VITAMIN D -3) 125 MCG (5000 UT) TABS Take 1 tablet by mouth daily.    [provider]  divalproex  (DEPAKOTE  SPRINKLES) 125 MG capsule Take 2 capsules (250 mg total) by mouth at bedtime. 07/27/23   Johny Nap, NP  furosemide  (LASIX ) 20 MG tablet Take 1 tablet (20 mg total) by mouth daily. Take additional 1 tablet ( 20 mg) for weight gain of 3 lbs in 1 day or 5 lbs in 1 week. 05/25/23   Patel, Pranav M, MD  melatonin 3 MG TABS tablet Take 1 tablet (3 mg total) by mouth at bedtime. 08/25/22   Angiulli, Everlyn Hockey, PA-C  polyethylene glycol (MIRALAX  / GLYCOLAX ) 17 g packet Take 17 g by mouth daily. Patient taking differently: Take 17 g by mouth daily as needed. 08/25/22   Angiulli, Everlyn Hockey, PA-C  QUEtiapine  (SEROQUEL ) 25 MG tablet Take 25 mg by mouth daily as needed.    [provider]  RA ACETAMINOPHEN  EX ST 500 MG tablet Take 1 tablet by mouth every six hours as needed 09/10/22   [provider]  tamsulosin  (FLOMAX ) 0.4 MG CAPS capsule Take 1 capsule (0.4 mg total) by mouth daily. 08/25/22   Angiulli, Everlyn Hockey, PA-C    Family History    History reviewed. No pertinent family history. He indicated that his mother is deceased. He indicated that his father is deceased.  Social History    Social History   Socioeconomic History   Marital status: Married    Spouse name: Dustin Blanchard   Number of children: 3   Years of education: Not on file   Highest education level: Not on file  Occupational History   Occupation: Retired  Tobacco Use   Smoking status: Former   Smokeless tobacco:  Never  Advertising account planner   Vaping status: Never Used  Substance and Sexual Activity   Alcohol use: No   Drug use: Not Currently   Sexual activity: Never  Other Topics Concern   Not on file  Social History Narrative   Not on file   Social Drivers of Health   Financial Resource Strain: Low Risk  (12/03/2023)   Received from [facility]   Overall Financial Resource Strain (CARDIA)    Difficulty of Paying Living Expenses: Not very hard  Food Insecurity: No Food Insecurity (12/03/2023)   Received from [facility]   Hunger Vital Sign    Worried About Running Out of Food in the Last Year: Never true    Ran Out of Food in the Last Year: Never true  Transportation Needs: No Transportation Needs (  12/03/2023)   Received from [facility]   PRAPARE - Transportation    In the past 12 months, has lack of transportation kept you from medical appointments or from getting medications?: No    Lack of Transportation (Non-Medical): No  Physical Activity: Not on file  Stress: Not on file  Social Connections: Not on file  Intimate Partner Violence: Patient Unable To Answer (05/21/2023)   Humiliation, Afraid, Rape, and Kick questionnaire    Fear of Current or Ex-Partner: Patient unable to answer    Emotionally Abused: Patient unable to answer    Physically Abused: Patient unable to answer    Sexually Abused: Patient unable to answer     Review of Systems    General:  No chills, fever, night sweats or weight changes.  Cardiovascular:  No chest pain, dyspnea on exertion, edema, orthopnea, palpitations, paroxysmal nocturnal dyspnea. Dermatological: No rash, lesions/masses Respiratory: No cough, dyspnea Urologic: No hematuria, dysuria Abdominal:   No nausea, vomiting, diarrhea, bright red blood per rectum, melena, or hematemesis Neurologic:  No visual changes, wkns, changes in mental status. All other systems reviewed and are otherwise negative except  as noted above.  Physical Exam    VS:  BP 94/62 (Cuff Size: Small)   Pulse 60   Ht 6\' 1"  (1.854 m)   Wt 138 lb (62.6 kg)   SpO2 94%   BMI 18.21 kg/m  , BMI Body mass index is 18.21 kg/m. GEN: Well nourished, well developed, in no acute distress. HEENT: normal. Neck: Supple, no JVD, carotid bruits, or masses. Cardiac: RRR, no murmurs, rubs, or gallops. No clubbing, cyanosis, edema.  Radials/DP/PT 2+ and equal bilaterally.  Respiratory:  Respirations regular and unlabored, clear to auscultation bilaterally. GI: Soft, nontender, nondistended, BS + x 4. MS: no deformity or atrophy. Skin: warm and dry, no rash. Neuro:  Strength and sensation are intact. Psych: Normal affect.  Accessory Clinical Findings    Recent Labs: 05/20/2023: TSH 2.003 06/18/2023: Magnesium  2.4 11/25/2023: ALT 15; B Natriuretic Peptide 1,241.0; BUN 15; Creatinine, Ser 0.87; Hemoglobin 13.8; Platelets 232; Potassium 4.1; Sodium 145   Recent Lipid Panel    Component Value Date/Time   CHOL 130 03/02/2023 1443   TRIG 86 03/02/2023 1443   HDL 51 03/02/2023 1443   CHOLHDL 2.5 03/02/2023 1443   CHOLHDL 2.8 08/07/2022 0241   VLDL 12 08/07/2022 0241   LDLCALC 62 03/02/2023 1443         ECG personally reviewed by me today-none today.      Echocardiogram 08/06/2022   IMPRESSIONS     1. Coarse trabeculation of the apex without obvious thrombus. Left  ventricular ejection fraction, by estimation, is 35 to 40%. Left  ventricular ejection fraction by 2D MOD biplane is 37.1 %. The left  ventricle has moderately decreased function. The left   ventricle demonstrates global hypokinesis. There is mild left ventricular  hypertrophy. Left ventricular diastolic parameters are consistent with  Grade III diastolic dysfunction (restrictive). Elevated left ventricular  end-diastolic pressure.   2. Right ventricular systolic function is low normal. The right  ventricular size is normal. There is mildly elevated  pulmonary artery  systolic pressure. The estimated right ventricular systolic pressure is  44.2 mmHg.   3. Left atrial size was severely dilated.   4. Moderate pericardial effusion. The pericardial effusion is  circumferential. There is no evidence of cardiac tamponade.   5. The mitral valve is abnormal. Moderate mitral valve regurgitation.   6. The aortic  valve is tricuspid. Aortic valve regurgitation is trivial.   7. The inferior vena cava is normal in size with greater than 50%  respiratory variability, suggesting right atrial pressure of 3 mmHg.   Comparison(s): No prior Echocardiogram.    Echocardiogram 05/21/2023  IMPRESSIONS     1. Left ventricular ejection fraction, by estimation, is 20 to 25%. The  left ventricle has severely decreased function. The left ventricle  demonstrates global hypokinesis. There is mild left ventricular  hypertrophy of the septal segment. Left  ventricular diastolic function could not be evaluated.   2. Right ventricular systolic function is normal. The right ventricular  size is normal. There is moderately elevated pulmonary artery systolic  pressure.   3. Left atrial size was severely dilated.   4. A small pericardial effusion is present. There is no evidence of  cardiac tamponade. Large pleural effusion.   5. The mitral valve is normal in structure. Mild mitral valve  regurgitation. No evidence of mitral stenosis.   6. The aortic valve is tricuspid. Aortic valve regurgitation is mild. No  aortic stenosis is present.   7. There is borderline dilatation of the aortic root, measuring 39 mm.  There is mild (Grade II) atheroma plaque involving the aortic root.   8. The inferior vena cava is dilated in size with <50% respiratory  variability, suggesting right atrial pressure of 15 mmHg.   FINDINGS   Left Ventricle: Left ventricular ejection fraction, by estimation, is 20  to 25%. The left ventricle has severely decreased function. The left   ventricle demonstrates global hypokinesis. The left ventricular internal  cavity size was normal in size. There  is mild left ventricular hypertrophy of the septal segment. Left  ventricular diastolic function could not be evaluated due to atrial  fibrillation. Left ventricular diastolic function could not be evaluated.  Indeterminate filling pressures.   Right Ventricle: The right ventricular size is normal. No increase in  right ventricular wall thickness. Right ventricular systolic function is  normal. There is moderately elevated pulmonary artery systolic pressure.  The tricuspid regurgitant velocity is  2.86 m/s, and with an assumed right atrial pressure of 15 mmHg, the  estimated right ventricular systolic pressure is 47.7 mmHg.   Left Atrium: Left atrial size was severely dilated.   Right Atrium: Right atrial size was normal in size.   Pericardium: A small pericardial effusion is present. There is no evidence  of cardiac tamponade.   Mitral Valve: The mitral valve is normal in structure. Mild mitral valve  regurgitation. No evidence of mitral valve stenosis.   Tricuspid Valve: The tricuspid valve is normal in structure. Tricuspid  valve regurgitation is trivial. No evidence of tricuspid stenosis.   Aortic Valve: The aortic valve is tricuspid. Aortic valve regurgitation is  mild. No aortic stenosis is present. Aortic valve mean gradient measures  2.0 mmHg. Aortic valve peak gradient measures 2.4 mmHg. Aortic valve area,  by VTI measures 2.96 cm.   Pulmonic Valve: The pulmonic valve was normal in structure. Pulmonic valve  regurgitation is trivial. No evidence of pulmonic stenosis.   Aorta: The aortic root is normal in size and structure. There is  borderline dilatation of the aortic root, measuring 39 mm. There is mild  (Grade II) atheroma plaque involving the aortic root.   Venous: The inferior vena cava is dilated in size with less than 50%  respiratory variability,  suggesting right atrial pressure of 15 mmHg.   IAS/Shunts: No atrial level shunt detected by  color flow Doppler.    Assessment & Plan   1.  Chronic systolic and diastolic CHF-euvolemic today.  Weight stable.  Echocardiogram 5/24 showed EF of 20-25%, normal RV function, severe left atrial enlargement, small pericardial effusion.  Repeat echocardiogram showed stable EF.  Noted to have low normal RV function Heart healthy low-sodium diet Continue daily weights Continue current medical therapy Elevate lower extremities when not active  Atrial fibrillation, atrial flutter-heart rate today 60.  Compliant with Eliquis .  No bleeding issues noted.  Previously noted to have 17 pauses with the longest being 4 seconds.  Previously referred to EP who did not recommend pacing. Avoid triggers caffeine, chocolate, EtOH, dehydration etc. Continue apixaban   Mitral valve regurgitation-maintaining baseline physical activity.  Previously noted to be moderate regurgitation on echocardiogram. Continue to monitor Maintain physical activity as tolerated  Hyperlipidemia-LDL 62 on 03/02/23. High-fiber diet Continue atorvastatin  Repeat fasting lipids and LFTs after 3/25  History of CVA-noted along left MCA and was treated with thrombectomy.  Was noted to have atrial fibrillation and was placed on anticoagulation. Continue apixaban , atorvastatin   Dementia-continues to be oriented to self. Follows with PCP  Disposition: Follow-up with Dr. Alda Amas or me in 4 months.   Chet Cota. Jasmain Ahlberg NP-C     01/05/2024, 11:12 AM [facility] [address] Office [phone] Fax [phone]    I spent 14 minutes examining this patient, reviewing medications, and using patient centered shared decision making involving their cardiac care.   I spent greater than 20 minutes reviewing their past medical history,  medications, and prior cardiac tests.

## 2024-01-05 ENCOUNTER — Ambulatory Visit: Payer: Medicare HMO | Attending: General Practice | Admitting: General Practice

## 2024-01-05 ENCOUNTER — Encounter: Payer: Self-pay | Admitting: General Practice

## 2024-01-05 VITALS — BP 94/62 | HR 60 | Ht 73.0 in | Wt 138.0 lb

## 2024-01-05 DIAGNOSIS — E785 Hyperlipidemia, unspecified: Secondary | ICD-10-CM | POA: Diagnosis not present

## 2024-01-05 DIAGNOSIS — I4891 Unspecified atrial fibrillation: Secondary | ICD-10-CM

## 2024-01-05 DIAGNOSIS — F0390 Unspecified dementia without behavioral disturbance: Secondary | ICD-10-CM

## 2024-01-05 DIAGNOSIS — I63412 Cerebral infarction due to embolism of left middle cerebral artery: Secondary | ICD-10-CM

## 2024-01-05 DIAGNOSIS — I5042 Chronic combined systolic (congestive) and diastolic (congestive) heart failure: Secondary | ICD-10-CM | POA: Diagnosis not present

## 2024-01-05 DIAGNOSIS — I340 Nonrheumatic mitral (valve) insufficiency: Secondary | ICD-10-CM

## 2024-01-05 NOTE — Patient Instructions (Signed)
 Medication Instructions:  The current medical regimen is effective;  continue present plan and medications as directed. Please refer to the Current Medication list given to you today.  *If you need a refill on your cardiac medications before your next appointment, please call your pharmacy*  Lab Work: NONE  Other Instructions PLEASE LOG YOUR WEIGHT DAILY-AFTER 1st URINATION ELEVATE LOWER EXTREMITIES WHEN NOT ACTIVE PLEASE READ AND FOLLOW ATTACHED  SALTY 6  Follow-Up: At [facility], you and your health needs are our priority.  As part of our continuing mission to provide you with exceptional heart care, we have created designated Provider Care Teams.  These Care Teams include your primary Cardiologist (physician) and Advanced Practice Providers (APPs -  Physician Assistants and Nurse Practitioners) who all work together to provide you with the care you need, when you need it.  Your next appointment:   4 month(s)  Provider:   Wendie Hamburg, MD  or Lawana Pray, FNP

## 2024-01-14 DIAGNOSIS — I480 Paroxysmal atrial fibrillation: Secondary | ICD-10-CM | POA: Insufficient documentation

## 2024-01-14 DIAGNOSIS — I484 Atypical atrial flutter: Secondary | ICD-10-CM | POA: Insufficient documentation

## 2024-01-17 ENCOUNTER — Ambulatory Visit: Payer: Medicare HMO | Attending: Internal Medicine | Admitting: Internal Medicine

## 2024-01-17 ENCOUNTER — Encounter: Payer: Self-pay | Admitting: Internal Medicine

## 2024-01-17 VITALS — BP 102/66 | HR 86 | Ht 73.0 in | Wt 140.0 lb

## 2024-01-17 DIAGNOSIS — I5042 Chronic combined systolic (congestive) and diastolic (congestive) heart failure: Secondary | ICD-10-CM | POA: Diagnosis not present

## 2024-01-17 DIAGNOSIS — I484 Atypical atrial flutter: Secondary | ICD-10-CM

## 2024-01-17 DIAGNOSIS — I480 Paroxysmal atrial fibrillation: Secondary | ICD-10-CM

## 2024-01-17 DIAGNOSIS — I428 Other cardiomyopathies: Secondary | ICD-10-CM

## 2024-01-17 DIAGNOSIS — Z79899 Other long term (current) drug therapy: Secondary | ICD-10-CM

## 2024-01-17 DIAGNOSIS — I455 Other specified heart block: Secondary | ICD-10-CM

## 2024-01-17 MED ORDER — AMIODARONE HCL 200 MG PO TABS: 200.0000 mg | ORAL_TABLET | Freq: Every day | ORAL | 1 refills | Status: AC

## 2024-01-17 NOTE — Progress Notes (Signed)
Patient Care Team: Diana Eves, MD as PCP - General Little Ishikawa, MD as PCP - Cardiology (Cardiology) Duke Salvia, MD as PCP - Electrophysiology (Cardiology)   HPI  Dustin Blanchard is a 73 y.o. male seen in follow-up for atrial flutter with associated pauses which were mostly nocturnal.  This occurring in the context of nonischemic cardiomyopathy; invasive evaluation has been deferred because of comorbidities including dementia and orientation x 1  Had been on amiodarone following presentation with a stroke 8/23.  Discontinued at rehab. Interval hospitalization 1/25 at [facility] for acute shortness of breath pneumonia versus edema.  Did diurese, BNP was elevated.  Echocardiogram showed new "severe pulmonary hypertension "right atrial dilatation underwent CTA which was negative but raise concerns for aspiration pneumonia  Walks some   but increasingly less Some edema   DATE TEST EF    8/23 Echo   35-40 %    2/24 Echo   20-25 % Biventricular dysfunction and enlargement   5/24 Echo  20-25% PA sys 47   1/25 Echo (CE) 20-25% PA sys 75 BAE-severe    Date Cr K Hgb  3/24 1.0 4.5 14.2               Thromboembolic risk factors ( age -95, HTN-1, TIA/CVA-2, CHF-1) for a CHADSVASc Score of >=5     Records and Results Reviewed  Past Medical History:  Diagnosis Date   Aphasia as late effect of stroke    Atrial flutter (HCC)    Cardiomyopathy, nonischemic (HCC)    Dementia (HCC)    Stroke [facility])     Past Surgical History:  Procedure Laterality Date   HERNIA REPAIR     HIP SURGERY     IR CT HEAD LTD  08/06/2022   IR CT HEAD LTD  08/06/2022   IR PERCUTANEOUS ART THROMBECTOMY/INFUSION INTRACRANIAL INC DIAG ANGIO  08/06/2022   IR US GUIDE VASC ACCESS RIGHT  08/06/2022   RADIOLOGY WITH ANESTHESIA N/A 08/06/2022   Procedure: IR WITH ANESTHESIA;  Surgeon: Julieanne Cotton, MD;  Location: [facility] OR;  Service: Radiology;  Laterality: N/A;    Current Meds  Medication Sig    apixaban (ELIQUIS) 5 MG TABS tablet Take 1 tablet (5 mg total) by mouth 2 (two) times daily.   atorvastatin (LIPITOR) 20 MG tablet Take 20 mg by mouth daily.   Cholecalciferol (VITAMIN D-3) 125 MCG (5000 UT) TABS Take 1 tablet by mouth daily.   furosemide (LASIX) 20 MG tablet Take 1 tablet (20 mg total) by mouth daily. Take additional 1 tablet ( 20 mg) for weight gain of 3 lbs in 1 day or 5 lbs in 1 week.   melatonin 3 MG TABS tablet Take 1 tablet (3 mg total) by mouth at bedtime.   mirtazapine (REMERON) 7.5 MG tablet TAKE 1 TABLET BY MOUTH AT BEDTIME.   polyethylene glycol (MIRALAX / GLYCOLAX) 17 g packet Take 17 g by mouth daily. (Patient taking differently: Take 17 g by mouth daily as needed.)   QUEtiapine (SEROQUEL) 25 MG tablet Take 25 mg by mouth daily as needed.   RA ACETAMINOPHEN EX ST 500 MG tablet Take 1 tablet by mouth every six hours as needed   tamsulosin (FLOMAX) 0.4 MG CAPS capsule Take 1 capsule (0.4 mg total) by mouth daily.   valproic acid (DEPAKENE) 250 MG/5ML solution Take 250 mg by mouth at bedtime.    No Known Allergies    Review of Systems negative  except from HPI and PMH  Physical Exam BP 102/66   Pulse 86   Ht 6\' 1"  (1.854 m)   Wt 140 lb (63.5 kg)   SpO2 93%   BMI 18.47 kg/m  Well developed and well nourished in no acute distress HENT normal E scleral and icterus clear Neck Supple JVP flat; carotids brisk and full Clear to ausculation Regular rate and rhythm, no murmurs gallops or rub Soft with active bowel sounds No clubbing cyanosis  Edema Alert and oriented, grossly normal motor and sensory function Skin Warm and Dry  ECG atrial fibrillation-course at about 110 bpm  CrCl cannot be calculated (Patient's most recent lab result is older than the maximum 21 days allowed.).   Assessment and  Plan  Atrial flutter with posttermination pauses previously now with persistent atrial flutter   Cardiomyopathy-nonischemic (presumed)   Stroke with  expressive aphasia   Hypotension and lightheadedness limiting guideline directed therapy   With rapid rates, begin him on low-dose amiodarone.  Will check annual labs in 6 weeks and then will have him follow-up with Dr. Jodene Nam and we will see as needed.  Discussed end-of-life situations.  She is not ready to sign a DNR.  She has however thought intentionally about if you were to be resuscitated to have a limited trial for response  Current medicines are reviewed at length with the patient today .  The patient does not have concerns regarding medicines.

## 2024-01-17 NOTE — Patient Instructions (Signed)
Medication Instructions:  Your physician has recommended you make the following change in your medication:  . ** Begin Amiodarone 200mg  - 1 tablet by mouth daily   *If you need a refill on your cardiac medications before your next appointment, please call your pharmacy*   Lab Work: Cmet and TSH in 6 weeks  If you have labs (blood work) drawn today and your tests are completely normal, you will receive your results only by: MyChart Message (if you have MyChart) OR A paper copy in the mail If you have any lab test that is abnormal or we need to change your treatment, we will call you to review the results.   Testing/Procedures: None ordered.    Follow-Up: At [facility], you and your health needs are our priority.  As part of our continuing mission to provide you with exceptional heart care, we have created designated Provider Care Teams.  These Care Teams include your primary Cardiologist (physician) and Advanced Practice Providers (APPs -  Physician Assistants and Nurse Practitioners) who all work together to provide you with the care you need, when you need it.  We recommend signing up for the patient portal called "MyChart".  Sign up information is provided on this After Visit Summary.  MyChart is used to connect with patients for Virtual Visits (Telemedicine).  Patients are able to view lab/test results, encounter notes, upcoming appointments, etc.  Non-urgent messages can be sent to your provider as well.   To learn more about what you can do with MyChart, go to [email or web address].    Your next appointment:  Follow up with Dr Graciela Husbands as needed

## 2024-02-09 ENCOUNTER — Telehealth: Payer: Medicare HMO | Admitting: Adult Health

## 2024-02-09 ENCOUNTER — Ambulatory Visit: Payer: Medicare HMO | Admitting: Adult Health

## 2024-02-09 ENCOUNTER — Encounter: Payer: Self-pay | Admitting: Adult Health

## 2024-02-09 DIAGNOSIS — I69320 Aphasia following cerebral infarction: Secondary | ICD-10-CM | POA: Diagnosis not present

## 2024-02-09 DIAGNOSIS — I63512 Cerebral infarction due to unspecified occlusion or stenosis of left middle cerebral artery: Secondary | ICD-10-CM | POA: Diagnosis not present

## 2024-02-09 DIAGNOSIS — I69391 Dysphagia following cerebral infarction: Secondary | ICD-10-CM

## 2024-02-09 DIAGNOSIS — F03911 Unspecified dementia, unspecified severity, with agitation: Secondary | ICD-10-CM

## 2024-02-09 DIAGNOSIS — I69351 Hemiplegia and hemiparesis following cerebral infarction affecting right dominant side: Secondary | ICD-10-CM | POA: Diagnosis not present

## 2024-02-09 DIAGNOSIS — I69319 Unspecified symptoms and signs involving cognitive functions following cerebral infarction: Secondary | ICD-10-CM

## 2024-02-09 MED ORDER — QUETIAPINE FUMARATE 25 MG PO TABS: 25.0000 mg | ORAL_TABLET | Freq: Every day | ORAL | 11 refills | Status: AC | PRN

## 2024-02-09 MED ORDER — VALPROIC ACID 250 MG/5ML PO SOLN: 250.0000 mg | Freq: Every day | ORAL | 11 refills | Status: AC

## 2024-02-09 NOTE — Progress Notes (Signed)
[facility] [address] [place]. [place] [address] [phone]       STROKE FOLLOW UP NOTE  Mr. Nana Hoselton Date of Birth:  Sep 20, 1951 Medical Record Number:  [id number]    Primary neurologist: Dr. Pearlean Brownie Reason for Referral: stroke follow up  Virtual Visit via Video Note  I connected with Nat Christen on 02/09/24 at 11:15 AM EST by a video enabled telemedicine application and verified that I am speaking with the correct person using two identifiers.  Location: Patient: at home Provider: at home   I discussed the limitations of evaluation and management by telemedicine and the availability of in person appointments. The patient expressed understanding and agreed to proceed.    SUBJECTIVE:   CHIEF COMPLAINT:  Chief Complaint  Patient presents with   Cerebrovascular Accident    Pt wife states he is doing PT but he seems to be dragging his R foot more. He is now on O2. Residual speech impairment and dysphagia. No other concerns.     HPI:   Update 02/09/2024 JM: Patient returns for 89-month follow-up via MyChart video visit accompanied by his wife who provides history.  Has been relatively stable from a stroke standpoint.  Continued right hemiparesis, dysphagia, aphasia, visual impairment cognitive impairment.  At prior visit, he was switched to Depakote sprinkles from liquid per wife request but has since transition back to liquid, also continues on Seroquel as needed.  He can have increased agitation at times especially when they leave the house, Seroquel works well.  Wife has noticed he has been dragging his right foot a little bit more but otherwise residual deficits stable.  He is currently working with [facility] PT. ambulates without AD, no recent falls.  Ambulation and PT can be limited due to oxygen levels dropping, he now is requiring use of oxygen as needed, sometimes patient will refuse to wear especially in the middle of the night.  He can have occasional  confusion but she is unsure if this is more related to low O2 levels.  He is currently being followed by pulmonology. Wife also notes increased lower extremity edema, currently using furosemide 20 mg daily, she gave him 40 mg this morning due to increased edema.  ED evaluation at [facility] in December for shortness of breath 2/2 acute on chronic CHF, he was diuresed with improvement of symptoms. Hospitalized at [facility] in January for acute respiratory failure with hypoxia 2/2 CHF vs aspiration PNA requiring oxygen, 2D echo showed similar EF of 20 to 25% and new severe pulmonary hypertension, CTA negative for PE but did raise concern for pneumonia, treated with antibiotics, noted requiring O2 while walking.  He is closely followed by cardiology and reports compliance on Eliquis, denies side effects, recently started on low-dose amiodarone due to persistent atrial flutter.  Routinely follows with PCP for stroke risk factor management.  No further questions or concerns at this time.      History provided for reference purposes only Update 07/27/2023 JM: Patient returns for follow-up visit accompanied by his wife.  Has been stable without new stroke/TIA symptoms.  Residual speech impairment, dysphagia, right-sided weakness, cognitive impairment and visual impairment stable. Ambulates without AD, denies any recent falls. Current diet dys 3 and thin liquids - tolerating well.  Continues on Depakene 250mg  liquid nightly, Seroquel as needed, can have occasional sundowning per wife, will improve after Seroquel dosage.  She questions trialing Depakote sprinkles as she can have difficulty administering liquid form at times.  Denies  any other significant behavioral concerns.  Compliant on Eliquis and atorvastatin.  Routinely follows with PCP and cardiology for stroke risk factor management.  Of note, was hospitalized for 4 days back in May with decompensated heart failure and improved after IV Lasix, routinely followed by  cardiology.   Update 01/26/2023 JM: Patient returns for 43-month stroke follow-up accompanied by his wife who provides history.  Reports residual speech impairment, dysphagia, right-sided weakness and visual impairment.  She does believe he has made some progress since prior visit.  Improvement of right-sided strength, now ambulates without assistive device, denies any recent falls.  Currently doing well on Dys 3 thin liquid diet. Speech still significantly limited but has been having some spontaneous speech more recently.  Wife tries to encourage him to do HEP routinely at home. Completed SLP back in December as he met [facility]. Completed PT/OT back in November with goals partially met and meeting [facility] potential. Continues on Seroquel and Depakene for mood stabilization which has been stable. Denies new stroke/TIA symptoms.   Compliant on Eliquis and atorvastatin Blood pressure well-controlled Routinely follows with PCP and cardiology  Initial visit 09/17/2022 JM: Patient being seen for hospital follow up accompanied by his wife who provides majority of history.  Reports continued right-sided weakness, aphasia, dysphagia and right-sided visual impairment.  Just recently started OT and SLP and recent initial PT evaluation, has follow-up visits today. Wife does note improvement since discharge.  Ambulating with RW, was not using before, denies any recent falls. Remains on pured diet with NTL. No new stroke/TIA symptoms. Continues on Seroquel and Depakene for mood stabilization, does have some agitation in the evening but does improve after medications. Sleeping well.  Appetite satisfactory, currently on mirtazapine (started during CIR admission).   Compliant on Eliquis and atorvastatin, denies side effects Blood pressure today 116/62, does not routinely monitor at home Has since been seen by PCP and cardiology, PMR f/u visit next week  Stroke admission 08/06/2022 Nahom Carfagno  is a 73 y.o. male with a past medical history significant for hypertension, hyperlipidemia, sinus bradycardia, BPH who presented to outside hospital after being found down at home on 08/06/2022.  He was found to have an acute left MCA stroke.  He was given TNK and transferred to [facility] for mechanical thrombectomy of left M2 occlusion, which was successful.  Punctate right ACA infarct was also seen on MRI.  Etiology likely due to newly diagnosed A-fib and was stared on Eliquis.  EF 35 to 40% with severe LA dilation.  LDL 71 -increase atorvastatin from 20 mg to 40 mg daily.  A1c 4.6.  Other stroke risk factors include advanced age and CHF.  No prior stroke history.  Hospital course complicated by cognitive impairment at baseline with delirium/sundowning, was placed on Seroquel and Depakote and advised to taper off meds once able.  He was transferred to [facility] for ongoing therapy needs.     PERTINENT IMAGING  CT showed no acute abnormality, old left occipital pole infarct.  Status post TNK.   CTA head and neck showed left M3 occlusion, bilateral siphon atherosclerosis with left moderate stenosis, left V4 stenosis.   CTP 0/123.   Status post IR with left M3 TICI2c reperfusion.   Repeat CT showed left frontal infarct without significant hemorrhage.   MRI showed left MCA patchy infarct with mild hemorrhagic conversion and a punctate right ACA infarct.   EF 35 to 40%. Severe LA dilation, LDL 71 A1c 4.6  UDS negative.  ROS:   N/A d/t language impairment  PMH:  Past Medical History:  Diagnosis Date   Aphasia as late effect of stroke    Atrial flutter (HCC)    Cardiomyopathy, nonischemic (HCC)    Dementia (HCC)    Stroke (HCC)     PSH:  Past Surgical History:  Procedure Laterality Date   HERNIA REPAIR     HIP SURGERY     IR CT HEAD LTD  08/06/2022   IR CT HEAD LTD  08/06/2022   IR PERCUTANEOUS ART THROMBECTOMY/INFUSION INTRACRANIAL INC DIAG ANGIO  08/06/2022   IR US GUIDE VASC ACCESS RIGHT   08/06/2022   RADIOLOGY WITH ANESTHESIA N/A 08/06/2022   Procedure: IR WITH ANESTHESIA;  Surgeon: Julieanne Cotton, MD;  Location: [facility] OR;  Service: Radiology;  Laterality: N/A;    Social History:  Social History   Socioeconomic History   Marital status: Married    Spouse name: Rinaldo Cloud   Number of children: 3   Years of education: Not on file   Highest education level: Not on file  Occupational History   Occupation: Retired  Tobacco Use   Smoking status: Former   Smokeless tobacco: Never  Advertising account planner   Vaping status: Never Used  Substance and Sexual Activity   Alcohol use: No   Drug use: Not Currently   Sexual activity: Never  Other Topics Concern   Not on file  Social History Narrative   Not on file   Social Drivers of Health   Financial Resource Strain: Low Risk  (12/03/2023)   Received from [facility]   Overall Financial Resource Strain (CARDIA)    Difficulty of Paying Living Expenses: Not very hard  Food Insecurity: No Food Insecurity (12/03/2023)   Received from [facility]   Hunger Vital Sign    Worried About Running Out of Food in the Last Year: Never true    Ran Out of Food in the Last Year: Never true  Transportation Needs: No Transportation Needs (12/03/2023)   Received from [facility] - Transportation    In the past 12 months, has lack of transportation kept you from medical appointments or from getting medications?: No    Lack of Transportation (Non-Medical): No  Physical Activity: Not on file  Stress: Not on file  Social Connections: Not on file  Intimate Partner Violence: Patient Unable To Answer (05/21/2023)   Humiliation, Afraid, Rape, and Kick questionnaire    Fear of Current or Ex-Partner: Patient unable to answer    Emotionally Abused: Patient unable to answer    Physically Abused: Patient unable to answer    Sexually Abused: Patient unable to answer    Family History: History  reviewed. No pertinent family history.  Medications:   Current Outpatient Medications on File Prior to Visit  Medication Sig Dispense Refill   amiodarone (PACERONE) 200 MG tablet Take 1 tablet (200 mg total) by mouth daily. 90 tablet 1   apixaban (ELIQUIS) 5 MG TABS tablet Take 1 tablet (5 mg total) by mouth 2 (two) times daily. 180 tablet 1   atorvastatin (LIPITOR) 20 MG tablet Take 20 mg by mouth daily.     Cholecalciferol (VITAMIN D-3) 125 MCG (5000 UT) TABS Take 1 tablet by mouth daily.     furosemide (LASIX) 20 MG tablet Take 1 tablet (20 mg total) by mouth daily. Take additional 1 tablet ( 20 mg) for weight gain of 3 lbs in 1 day or  5 lbs in 1 week. 30 tablet 0   melatonin 3 MG TABS tablet Take 1 tablet (3 mg total) by mouth at bedtime. 30 tablet 0   mirtazapine (REMERON) 7.5 MG tablet TAKE 1 TABLET BY MOUTH AT BEDTIME. 90 tablet 3   polyethylene glycol (MIRALAX / GLYCOLAX) 17 g packet Take 17 g by mouth daily. (Patient taking differently: Take 17 g by mouth daily as needed.) 14 each 0   QUEtiapine (SEROQUEL) 25 MG tablet Take 25 mg by mouth daily as needed.     RA ACETAMINOPHEN EX ST 500 MG tablet Take 1 tablet by mouth every six hours as needed     tamsulosin (FLOMAX) 0.4 MG CAPS capsule Take 1 capsule (0.4 mg total) by mouth daily. 30 capsule 0   valproic acid (DEPAKENE) 250 MG/5ML solution Take 250 mg by mouth at bedtime.     No current facility-administered medications on file prior to visit.    Allergies:  No Known Allergies    OBJECTIVE:  Physical Exam  General: well developed, well nourished, very pleasant elderly African-American male, seated, in no evident distress, use of O2 via [place]  Neurologic Exam Mental Status: Awake and fully alert.  Severe expressive, no intelligible speech.  Wife provides history.        ASSESSMENT: Dennard Vezina is a 73 y.o. year old male left MCA patchy infarct and punctate right ACA infarct due to left M3 occlusion on 08/06/2022 s/p TNK  and IR with TICI 2C reperfusion, embolic source likely due to newly diagnosed A-fib. Vascular risk factors include HTN, HLD, A fib, CHF, tachybradycardia syndrome, cognitive impairment at baseline and respiratory failure now requiring O2 as needed.     PLAN:  Embolic stroke:  Residual deficit: Mild right-sided weakness, severe expressive aphasia, right sided visual loss, cognitive impairment and dysphagia.  Wife reports some possible increased RLE weakness (dragging right foot more), due to visit type, unable to fully evaluate but suspect due to limited mobility due to newer dx of respiratory failure now requiring O2 and chronic CHF.  Encouraged continued working with [facility] PT, consider outpatient therapies once completed if needed.  Continue Depakote suspension 250 mg nightly and Seroquel 25 mg daily as needed.   Continue Eliquis 5mg  twice daily and atorvastatin (Lipitor) 40 mg daily for secondary stroke prevention managed by PCP/cardiology.   Continue routine follow-up with cardiology for Eliquis and A-fib management Discussed secondary stroke prevention measures and importance of close PCP follow up for aggressive stroke risk factor management including BP goal<130/90, HLD with LDL goal<70 and DM with A1c.<7 .  I have gone over the pathophysiology of stroke, warning signs and symptoms, risk factors and their management in some detail with instructions to go to the closest emergency room for symptoms of concern      Follow up in 6 months or call earlier if needed   CC:  PCP: Oscar La, MD    I spent 30 minutes of face-to-face and non-face-to-face time with patient and wife via MyChart video visit.  This included previsit chart review, lab review, study review, order entry, electronic health record documentation, patient and wife education and discussion regarding above diagnoses and treatment plan and answered all questions to patient and wife satisfaction  Ihor Austin,  AGNP-BC  [facility] [address] [place], [place] [address]  Phone [phone] Fax [phone] Note: This document was prepared with digital dictation and possible smart phrase technology. Any transcriptional errors that result from this  process are unintentional.

## 2024-02-29 LAB — COMPREHENSIVE METABOLIC PANEL
ALT: 11 IU/L (ref 0–44)
AST: 17 IU/L (ref 0–40)
Albumin: 3.5 g/dL — ABNORMAL LOW (ref 3.8–4.8)
Alkaline Phosphatase: 101 IU/L (ref 44–121)
BUN/Creatinine Ratio: 16 (ref 10–24)
BUN: 16 mg/dL (ref 8–27)
Bilirubin Total: 0.6 mg/dL (ref 0.0–1.2)
CO2: 30 mmol/L — ABNORMAL HIGH (ref 20–29)
Calcium: 9.4 mg/dL (ref 8.6–10.2)
Chloride: 109 mmol/L — ABNORMAL HIGH (ref 96–106)
Creatinine, Ser: 0.99 mg/dL (ref 0.76–1.27)
Globulin, Total: 2.3 g/dL (ref 1.5–4.5)
Glucose: 111 mg/dL — ABNORMAL HIGH (ref 70–99)
Potassium: 3.8 mmol/L (ref 3.5–5.2)
Sodium: 150 mmol/L — ABNORMAL HIGH (ref 134–144)
Total Protein: 5.8 g/dL — ABNORMAL LOW (ref 6.0–8.5)
eGFR: 81 mL/min/{1.73_m2} (ref >59)

## 2024-02-29 LAB — TSH: TSH: 3.12 u[IU]/mL (ref 0.450–4.500)

## 2024-03-17 ENCOUNTER — Other Ambulatory Visit: Payer: Self-pay

## 2024-03-17 DIAGNOSIS — E870 Hyperosmolality and hypernatremia: Secondary | ICD-10-CM

## 2024-03-17 DIAGNOSIS — Z79899 Other long term (current) drug therapy: Secondary | ICD-10-CM

## 2024-03-17 NOTE — Progress Notes (Signed)
 Repeat BMET due to hypernatremia

## 2024-03-21 LAB — BASIC METABOLIC PANEL WITH GFR
BUN/Creatinine Ratio: 17 (ref 10–24)
BUN: 17 mg/dL (ref 8–27)
CO2: 28 mmol/L (ref 20–29)
Calcium: 9.1 mg/dL (ref 8.6–10.2)
Chloride: 107 mmol/L — ABNORMAL HIGH (ref 96–106)
Creatinine, Ser: 0.99 mg/dL (ref 0.76–1.27)
Glucose: 104 mg/dL — ABNORMAL HIGH (ref 70–99)
Potassium: 3.5 mmol/L (ref 3.5–5.2)
Sodium: 146 mmol/L — ABNORMAL HIGH (ref 134–144)
eGFR: 81 mL/min/{1.73_m2} (ref >59)

## 2024-03-27 ENCOUNTER — Encounter: Payer: Self-pay | Admitting: *Deleted

## 2024-05-04 ENCOUNTER — Ambulatory Visit: Payer: Medicare HMO | Admitting: General Practice

## 2024-05-21 DEATH — deceased

## 2024-06-17 ENCOUNTER — Encounter (HOSPITAL_COMMUNITY): Payer: Self-pay | Admitting: Interventional Radiology

## 2024-08-23 ENCOUNTER — Telehealth: Payer: Self-pay | Admitting: Adult Health

## 2024-08-23 NOTE — Telephone Encounter (Signed)
 Noted

## 2024-08-23 NOTE — Telephone Encounter (Signed)
 Pt wife called to informed that Pt pass 3 wks ago , Just wanted MD to be aware.

## 2024-08-24 ENCOUNTER — Ambulatory Visit: Payer: Medicare HMO | Admitting: Adult Health
